# Patient Record
Sex: Female | Born: 1960 | Race: White | Hispanic: No | Marital: Married | Smoking: Never smoker
Health system: Southern US, Community
[De-identification: ages and names within clinical notes are randomized; demographics above are authoritative.]

## PROBLEM LIST (undated history)

## (undated) DIAGNOSIS — U071 COVID-19: Secondary | ICD-10-CM

## (undated) DIAGNOSIS — D126 Benign neoplasm of colon, unspecified: Secondary | ICD-10-CM

## (undated) DIAGNOSIS — K200 Eosinophilic esophagitis: Secondary | ICD-10-CM

## (undated) DIAGNOSIS — J45909 Unspecified asthma, uncomplicated: Secondary | ICD-10-CM

## (undated) DIAGNOSIS — M1990 Unspecified osteoarthritis, unspecified site: Secondary | ICD-10-CM

## (undated) DIAGNOSIS — K5790 Diverticulosis of intestine, part unspecified, without perforation or abscess without bleeding: Secondary | ICD-10-CM

## (undated) DIAGNOSIS — Z803 Family history of malignant neoplasm of breast: Secondary | ICD-10-CM

## (undated) DIAGNOSIS — R748 Abnormal levels of other serum enzymes: Secondary | ICD-10-CM

## (undated) DIAGNOSIS — M1120 Other chondrocalcinosis, unspecified site: Secondary | ICD-10-CM

## (undated) DIAGNOSIS — Z801 Family history of malignant neoplasm of trachea, bronchus and lung: Secondary | ICD-10-CM

## (undated) DIAGNOSIS — Z808 Family history of malignant neoplasm of other organs or systems: Secondary | ICD-10-CM

## (undated) DIAGNOSIS — E119 Type 2 diabetes mellitus without complications: Secondary | ICD-10-CM

## (undated) DIAGNOSIS — G589 Mononeuropathy, unspecified: Secondary | ICD-10-CM

## (undated) DIAGNOSIS — R7689 Other specified abnormal immunological findings in serum: Secondary | ICD-10-CM

## (undated) DIAGNOSIS — K760 Fatty (change of) liver, not elsewhere classified: Secondary | ICD-10-CM

## (undated) DIAGNOSIS — R32 Unspecified urinary incontinence: Secondary | ICD-10-CM

## (undated) DIAGNOSIS — K648 Other hemorrhoids: Secondary | ICD-10-CM

## (undated) DIAGNOSIS — K222 Esophageal obstruction: Secondary | ICD-10-CM

## (undated) DIAGNOSIS — T7840XA Allergy, unspecified, initial encounter: Secondary | ICD-10-CM

## (undated) DIAGNOSIS — E785 Hyperlipidemia, unspecified: Secondary | ICD-10-CM

## (undated) DIAGNOSIS — K224 Dyskinesia of esophagus: Secondary | ICD-10-CM

## (undated) DIAGNOSIS — K219 Gastro-esophageal reflux disease without esophagitis: Secondary | ICD-10-CM

## (undated) DIAGNOSIS — R0981 Nasal congestion: Secondary | ICD-10-CM

## (undated) DIAGNOSIS — S149XXA Injury of unspecified nerves of neck, initial encounter: Secondary | ICD-10-CM

## (undated) DIAGNOSIS — R768 Other specified abnormal immunological findings in serum: Secondary | ICD-10-CM

## (undated) DIAGNOSIS — Z8049 Family history of malignant neoplasm of other genital organs: Secondary | ICD-10-CM

## (undated) HISTORY — DX: Family history of malignant neoplasm of other genital organs: Z80.49

## (undated) HISTORY — DX: Other specified abnormal immunological findings in serum: R76.8

## (undated) HISTORY — DX: Gastro-esophageal reflux disease without esophagitis: K21.9

## (undated) HISTORY — PX: ABDOMINAL HYSTERECTOMY: SHX81

## (undated) HISTORY — DX: Diverticulosis of intestine, part unspecified, without perforation or abscess without bleeding: K57.90

## (undated) HISTORY — DX: Other chondrocalcinosis, unspecified site: M11.20

## (undated) HISTORY — DX: Unspecified osteoarthritis, unspecified site: M19.90

## (undated) HISTORY — DX: Type 2 diabetes mellitus without complications: E11.9

## (undated) HISTORY — DX: Family history of malignant neoplasm of trachea, bronchus and lung: Z80.1

## (undated) HISTORY — DX: COVID-19: U07.1

## (undated) HISTORY — PX: COLONOSCOPY: SHX174

## (undated) HISTORY — DX: Other specified abnormal immunological findings in serum: R76.89

## (undated) HISTORY — DX: Other hemorrhoids: K64.8

## (undated) HISTORY — DX: Fatty (change of) liver, not elsewhere classified: K76.0

## (undated) HISTORY — DX: Hyperlipidemia, unspecified: E78.5

## (undated) HISTORY — DX: Eosinophilic esophagitis: K20.0

## (undated) HISTORY — DX: Abnormal levels of other serum enzymes: R74.8

## (undated) HISTORY — DX: Mononeuropathy, unspecified: G58.9

## (undated) HISTORY — DX: Dyskinesia of esophagus: K22.4

## (undated) HISTORY — DX: Esophageal obstruction: K22.2

## (undated) HISTORY — DX: Allergy, unspecified, initial encounter: T78.40XA

## (undated) HISTORY — DX: Nasal congestion: R09.81

## (undated) HISTORY — DX: Unspecified asthma, uncomplicated: J45.909

## (undated) HISTORY — DX: Unspecified urinary incontinence: R32

## (undated) HISTORY — DX: Injury of unspecified nerves of neck, initial encounter: S14.9XXA

## (undated) HISTORY — DX: Family history of malignant neoplasm of breast: Z80.3

## (undated) HISTORY — DX: Benign neoplasm of colon, unspecified: D12.6

## (undated) HISTORY — DX: Family history of malignant neoplasm of other organs or systems: Z80.8

---
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
## 1996-11-13 HISTORY — PX: TUBAL LIGATION: SHX77

## 2002-11-03 ENCOUNTER — Other Ambulatory Visit: Admission: RE | Admit: 2002-11-03 | Discharge: 2002-11-03 | Payer: Self-pay | Admitting: Dermatology

## 2010-07-12 ENCOUNTER — Ambulatory Visit: Payer: Self-pay | Admitting: Gynecology

## 2010-07-12 ENCOUNTER — Other Ambulatory Visit: Admission: RE | Admit: 2010-07-12 | Discharge: 2010-07-12 | Payer: Self-pay | Admitting: Gynecology

## 2010-07-14 HISTORY — PX: TOTAL VAGINAL HYSTERECTOMY: SHX2548

## 2010-07-15 ENCOUNTER — Ambulatory Visit: Payer: Self-pay | Admitting: Gynecology

## 2010-08-01 ENCOUNTER — Ambulatory Visit: Payer: Self-pay | Admitting: Gynecology

## 2010-08-09 ENCOUNTER — Ambulatory Visit: Payer: Self-pay | Admitting: Gynecology

## 2010-08-09 ENCOUNTER — Ambulatory Visit (HOSPITAL_BASED_OUTPATIENT_CLINIC_OR_DEPARTMENT_OTHER): Admission: RE | Admit: 2010-08-09 | Discharge: 2010-08-10 | Payer: Self-pay | Admitting: Gynecology

## 2010-08-24 ENCOUNTER — Ambulatory Visit: Payer: Self-pay | Admitting: Gynecology

## 2010-09-07 ENCOUNTER — Ambulatory Visit: Payer: Self-pay | Admitting: Gynecology

## 2011-01-26 LAB — DIFFERENTIAL
Eosinophils Relative: 0 % (ref 0–5)
Lymphocytes Relative: 17 % (ref 12–46)
Lymphs Abs: 2.6 10*3/uL (ref 0.7–4.0)
Monocytes Relative: 7 % (ref 3–12)

## 2011-01-26 LAB — CBC
HCT: 34 % — ABNORMAL LOW (ref 36.0–46.0)
Hemoglobin: 11.8 g/dL — ABNORMAL LOW (ref 12.0–15.0)
MCV: 89.3 fL (ref 78.0–100.0)
Platelets: 205 10*3/uL (ref 150–400)
RBC: 3.8 MIL/uL — ABNORMAL LOW (ref 3.87–5.11)
WBC: 15 10*3/uL — ABNORMAL HIGH (ref 4.0–10.5)

## 2011-05-19 ENCOUNTER — Encounter: Payer: Self-pay | Admitting: *Deleted

## 2011-07-20 ENCOUNTER — Encounter: Payer: Self-pay | Admitting: Gynecology

## 2011-08-24 ENCOUNTER — Encounter: Payer: Self-pay | Admitting: Gynecology

## 2011-08-24 ENCOUNTER — Other Ambulatory Visit (HOSPITAL_COMMUNITY)
Admission: RE | Admit: 2011-08-24 | Discharge: 2011-08-24 | Disposition: A | Discharge status: Home / Self Care | Payer: BC Managed Care – PPO | Source: Ambulatory Visit | Attending: Gynecology | Admitting: Gynecology

## 2011-08-24 ENCOUNTER — Other Ambulatory Visit: Payer: Self-pay | Admitting: Gynecology

## 2011-08-24 ENCOUNTER — Ambulatory Visit (INDEPENDENT_AMBULATORY_CARE_PROVIDER_SITE_OTHER): Payer: BC Managed Care – PPO | Admitting: Gynecology

## 2011-08-24 VITALS — BP 130/70 | Ht 65.5 in | Wt 170.0 lb

## 2011-08-24 DIAGNOSIS — N898 Other specified noninflammatory disorders of vagina: Secondary | ICD-10-CM

## 2011-08-24 DIAGNOSIS — R0981 Nasal congestion: Secondary | ICD-10-CM | POA: Insufficient documentation

## 2011-08-24 DIAGNOSIS — F411 Generalized anxiety disorder: Secondary | ICD-10-CM

## 2011-08-24 DIAGNOSIS — R42 Dizziness and giddiness: Secondary | ICD-10-CM

## 2011-08-24 DIAGNOSIS — B373 Candidiasis of vulva and vagina: Secondary | ICD-10-CM

## 2011-08-24 DIAGNOSIS — Z23 Encounter for immunization: Secondary | ICD-10-CM

## 2011-08-24 DIAGNOSIS — Z1231 Encounter for screening mammogram for malignant neoplasm of breast: Secondary | ICD-10-CM

## 2011-08-24 DIAGNOSIS — F419 Anxiety disorder, unspecified: Secondary | ICD-10-CM

## 2011-08-24 DIAGNOSIS — Z01419 Encounter for gynecological examination (general) (routine) without abnormal findings: Secondary | ICD-10-CM

## 2011-08-24 MED ORDER — ALPRAZOLAM 0.5 MG PO TABS: 0.5000 mg | ORAL_TABLET | Freq: Every evening | ORAL | Status: AC | PRN

## 2011-08-24 MED ORDER — FLUCONAZOLE 150 MG PO TABS: 150.0000 mg | ORAL_TABLET | Freq: Once | ORAL | Status: AC

## 2011-08-24 NOTE — Progress Notes (Signed)
Carla Morales [birth year] [id number]        50 y.o.  for annual exam.  Notes itchy vaginal discharge comes and goes over the last several months. She is status post [facility] in September 2011 due to menorrhagia, leiomyoma and prolapse.  She does note 2 week history of some vertigo with positional changes going from lying to standing or from standing to lying or bending over. She says the room spins. She has no overt sinusitis, congestion symptoms. No fevers chills or URI symptoms.  Past medical history,surgical history, medications, allergies, family history and social history were all reviewed and documented in the EPIC chart. ROS:  Was performed and pertinent positives and negatives are included in the history.  Exam: chaperone present Filed Vitals:   08/24/11 1020  BP: 130/70   General appearance  Normal Skin grossly normal Head/Neck normal with no cervical or supraclavicular adenopathy thyroid normal. No nystagmus.  Ear exam is normal noting normal tympanic membranes bilaterally. Lungs  clear Cardiac RR, without RMG Abdominal  soft, nontender, without masses, organomegaly or hernia Breasts  examined lying and sitting without masses, retractions, discharge or axillary adenopathy. Pelvic  Ext/BUS/vagina  Normal with white discharge, KOH wet prep done, Pap done  Adnexa  Without masses or tenderness    Anus and perineum  normal   Rectovaginal  normal sphincter tone without palpated masses or tenderness.    Assessment/Plan:  50 y.o. female for annual exam.    1. White discharge.  KOH prep was positive for yeast we'll treat with Diflucan 150x1 dose. Follow up if symptoms persist or recur. 2. Vertigo.  I think this is inner ear-related. Exam is normal.  Recommended OTC decongestant for 2 weeks. If continues will follow up with her primary. 3. Health maintenance. Self breast exams on a monthly basis discussed encouraged. Overdue for mammogram and I strongly encouraged her to schedule this and  she agrees to do so. Lipid profile glucose done through her primary. I ordered a CBC and urinalysis. She did ask for Xanax that she uses occasionally for anxiety and I prescribed #30 0.5 mg tablets no refill to take 1/2-6 hours when necessary. She also received the flu shot today. Assuming she continues well from a gynecologic standpoint she will see [place] in a year sooner as needed    Dara Lords MD, 11:10 AM 08/24/2011

## 2011-08-24 NOTE — Patient Instructions (Signed)
Schedule mammogram.

## 2011-09-13 ENCOUNTER — Ambulatory Visit
Admission: RE | Admit: 2011-09-13 | Discharge: 2011-09-13 | Disposition: A | Discharge status: Home / Self Care | Payer: BC Managed Care – PPO | Source: Ambulatory Visit | Attending: Gynecology | Admitting: Gynecology

## 2011-09-13 DIAGNOSIS — Z1231 Encounter for screening mammogram for malignant neoplasm of breast: Secondary | ICD-10-CM

## 2011-10-09 ENCOUNTER — Other Ambulatory Visit: Payer: Self-pay | Admitting: Family Medicine

## 2011-10-12 ENCOUNTER — Ambulatory Visit (HOSPITAL_COMMUNITY): Payer: BC Managed Care – PPO

## 2012-10-09 ENCOUNTER — Encounter: Payer: Self-pay | Admitting: Gynecology

## 2012-10-09 ENCOUNTER — Ambulatory Visit (INDEPENDENT_AMBULATORY_CARE_PROVIDER_SITE_OTHER): Payer: BC Managed Care – PPO | Admitting: Gynecology

## 2012-10-09 VITALS — BP 116/78 | Ht 65.0 in | Wt 173.0 lb

## 2012-10-09 DIAGNOSIS — Z01419 Encounter for gynecological examination (general) (routine) without abnormal findings: Secondary | ICD-10-CM

## 2012-10-09 DIAGNOSIS — R21 Rash and other nonspecific skin eruption: Secondary | ICD-10-CM

## 2012-10-09 MED ORDER — NYSTATIN-TRIAMCINOLONE 100000-0.1 UNIT/GM-% EX OINT: TOPICAL_OINTMENT | Freq: Two times a day (BID) | CUTANEOUS | Status: DC

## 2012-10-09 NOTE — Patient Instructions (Signed)
Try cream under breasts for rash.  See a dermatologist if it continue. Schedule mammogram. Follow up in one year for annual exam

## 2012-10-09 NOTE — Progress Notes (Signed)
Carla Morales 08/25/1961 [id number]        51 y.o.  G1P1 for annual exam.  Several issues that are below.  Past medical history,surgical history, medications, allergies, family history and social history were all reviewed and documented in the EPIC chart. ROS:  Was performed and pertinent positives and negatives are included in the history.  Exam: Fleet Contras assistant Filed Vitals:   10/09/12 1032  BP: 116/78  Height: 5\' 5"  (1.651 m)  Weight: 173 lb (78.472 kg)   General appearance  Normal Skin grossly normal Head/Neck normal with no cervical or supraclavicular adenopathy thyroid normal Lungs  clear Cardiac RR, without RMG Abdominal  soft, nontender, without masses, organomegaly or hernia Breasts  examined lying and sitting without masses, retractions, discharge or axillary adenopathy.  Papular erythematous rash under both breasts. Pelvic  Ext/BUS/vagina  normal   Adnexa  Without masses or tenderness    Anus and perineum  normal   Rectovaginal  normal sphincter tone without palpated masses or tenderness.    Assessment/Plan:  51 y.o. G1P1 female for annual exam.   1. TVH 2011 doing well. No menopausal symptoms. Continue to monitor. 2. Pap smear. No Pap smear done today. Last Pap smear 2012. No history of abnormal Pap smears. Status post hysterectomy for benign indications. Options to stop screening altogether or less frequent screening reviewed. We'll readdress on an annual basis. 3. Mammogram. Patient overdo and knows to schedule this. Agrees to do so.  SBE monthly. 4. Rash under each breast. Not classic for yeast. We'll treat with Mytrex twice a day. If continues will make appointment to see a dermatologist. If results and will follow. 5. Health maintenance. Actively being followed by her primary physician recently started on Pravachol.  No lab work done today as is all done through her primary physician's office. Follow up one year, sooner as needed.   Dara Lords MD,  11:01 AM 10/09/2012

## 2012-10-14 ENCOUNTER — Encounter: Payer: Self-pay | Admitting: Gynecology

## 2013-04-10 ENCOUNTER — Other Ambulatory Visit: Payer: Self-pay

## 2013-04-10 MED ORDER — PRAVASTATIN SODIUM 20 MG PO TABS: 20.0000 mg | ORAL_TABLET | Freq: Every day | ORAL | Status: DC

## 2013-05-31 ENCOUNTER — Other Ambulatory Visit: Payer: Self-pay | Admitting: Family Medicine

## 2013-06-03 NOTE — Telephone Encounter (Signed)
LAST LABS 12/13. NTBS

## 2013-06-16 ENCOUNTER — Other Ambulatory Visit: Payer: Self-pay | Admitting: Family Medicine

## 2013-06-17 NOTE — Telephone Encounter (Signed)
Patient last seen in office on 10-08-12. Please advise

## 2013-06-24 ENCOUNTER — Other Ambulatory Visit: Payer: Self-pay

## 2013-06-24 DIAGNOSIS — Z1231 Encounter for screening mammogram for malignant neoplasm of breast: Secondary | ICD-10-CM

## 2013-07-04 ENCOUNTER — Ambulatory Visit
Admission: RE | Admit: 2013-07-04 | Discharge: 2013-07-04 | Disposition: A | Discharge status: Home / Self Care | Payer: BC Managed Care – PPO | Source: Ambulatory Visit

## 2013-07-04 DIAGNOSIS — Z1231 Encounter for screening mammogram for malignant neoplasm of breast: Secondary | ICD-10-CM

## 2013-07-11 ENCOUNTER — Other Ambulatory Visit: Payer: Self-pay | Admitting: Family Medicine

## 2013-08-15 ENCOUNTER — Encounter: Payer: Self-pay | Admitting: Family Medicine

## 2013-08-15 ENCOUNTER — Ambulatory Visit (INDEPENDENT_AMBULATORY_CARE_PROVIDER_SITE_OTHER): Payer: BC Managed Care – PPO | Admitting: Family Medicine

## 2013-08-15 VITALS — BP 121/71 | HR 59 | Temp 98.0°F | Ht 66.0 in | Wt 181.6 lb

## 2013-08-15 DIAGNOSIS — J3489 Other specified disorders of nose and nasal sinuses: Secondary | ICD-10-CM

## 2013-08-15 DIAGNOSIS — R0981 Nasal congestion: Secondary | ICD-10-CM

## 2013-08-15 DIAGNOSIS — G589 Mononeuropathy, unspecified: Secondary | ICD-10-CM | POA: Insufficient documentation

## 2013-08-15 DIAGNOSIS — T7840XA Allergy, unspecified, initial encounter: Secondary | ICD-10-CM | POA: Insufficient documentation

## 2013-08-15 DIAGNOSIS — J3089 Other allergic rhinitis: Secondary | ICD-10-CM | POA: Insufficient documentation

## 2013-08-15 DIAGNOSIS — J309 Allergic rhinitis, unspecified: Secondary | ICD-10-CM | POA: Insufficient documentation

## 2013-08-15 DIAGNOSIS — E785 Hyperlipidemia, unspecified: Secondary | ICD-10-CM | POA: Insufficient documentation

## 2013-08-15 DIAGNOSIS — G43909 Migraine, unspecified, not intractable, without status migrainosus: Secondary | ICD-10-CM

## 2013-08-15 MED ORDER — FLUTICASONE PROPIONATE 50 MCG/ACT NA SUSP: 2.0000 | Freq: Every day | NASAL | Status: DC

## 2013-08-15 MED ORDER — MELOXICAM 15 MG PO TABS: 7.5000 mg | ORAL_TABLET | Freq: Every day | ORAL | Status: DC

## 2013-08-15 MED ORDER — CYCLOBENZAPRINE HCL 10 MG PO TABS: 10.0000 mg | ORAL_TABLET | Freq: Every day | ORAL | Status: DC

## 2013-08-15 NOTE — Progress Notes (Signed)
Patient ID: Carla Morales, female   DOB: January 27, 1961, 52 y.o.   MRN: [id number] SUBJECTIVE: CC: Chief Complaint  Patient presents with  . Follow-up    discuss labs been off cholesterol meds and states "feels better when not on it"    HPI: Taking Omega 3 Krill oil. Than being on statin. She feels better without the statin Neck still flares up occasional. Wear a night guard for her teeth. Got a neck pillow: did not do well with her neck. Stopped Pravastatin 3 weeks.  Past Medical History  Diagnosis Date  . Migraine   . Sinus congestion   . Hyperlipidemia   . Pinched nerve in neck    Past Surgical History  Procedure Laterality Date  . Tonsillectomy    . Bladder dialation      as a child  . Tubal ligation    . Total vaginal hysterectomy  07/2010    menorrhagia, leiomyoma, prolapse   History   Social History  . Marital Status: Married    Spouse Name: N/A    Number of Children: N/A  . Years of Education: N/A   Occupational History  . Not on file.   Social History Main Topics  . Smoking status: Never Smoker   . Smokeless tobacco: Never Used  . Alcohol Use: No  . Drug Use: No  . Sexual Activity: Yes    Partners: Male    Birth Control/ Protection: Surgical     Comment: hyst.   Other Topics Concern  . Not on file   Social History Narrative  . No narrative on file   Family History  Problem Relation Age of Onset  . Hypertension Father   . Diabetes Maternal Aunt   . Diabetes Maternal Uncle   . Diabetes Paternal Aunt   . Breast cancer Paternal Aunt     Dx in her 41's  . Diabetes Paternal Uncle   . Heart disease Maternal Grandfather    Current Outpatient Prescriptions on File Prior to Visit  Medication Sig Dispense Refill  . cyclobenzaprine (FLEXERIL) 10 MG tablet Take 10 mg by mouth 3 (three) times daily as needed.      Marland Kitchen ibuprofen (ADVIL,MOTRIN) 800 MG tablet Take 800 mg by mouth every 8 (eight) hours as needed.        . pravastatin (PRAVACHOL) 20 MG tablet  TAKE 1 TABLET (20 MG TOTAL) BY MOUTH DAILY.  15 tablet  0   No current facility-administered medications on file prior to visit.   Allergies  Allergen Reactions  . Haldol [Haloperidol Decanoate]   . Sulfa Antibiotics    Immunization History  Administered Date(s) Administered  . Influenza Split 08/24/2011, 08/13/2013   Prior to Admission medications   Medication Sig Start Date End Date Taking? Authorizing Provider  cyclobenzaprine (FLEXERIL) 10 MG tablet Take 10 mg by mouth 3 (three) times daily as needed.   Yes Historical Provider, MD  meloxicam (MOBIC) 7.5 MG tablet Take 7.5 mg by mouth daily.   Yes Historical Provider, MD  Omega-3 Krill Oil 500 MG CAPS Take 1 capsule by mouth daily.   Yes Historical Provider, MD  ibuprofen (ADVIL,MOTRIN) 800 MG tablet Take 800 mg by mouth every 8 (eight) hours as needed.      Historical Provider, MD  pravastatin (PRAVACHOL) 20 MG tablet TAKE 1 TABLET (20 MG TOTAL) BY MOUTH DAILY. 06/16/13   Ileana Ladd, MD     ROS: As above in the HPI. All other systems are stable or  negative.  OBJECTIVE: APPEARANCE:  Patient in no acute distress.The patient appeared well nourished and normally developed. Acyanotic. Waist: VITAL SIGNS:BP 121/71  Pulse 59  Temp(Src) 98 F (36.7 C) (Oral)  Ht 5\' 6"  (1.676 m)  Wt 181 lb 9.6 oz (82.373 kg)  BMI 29.32 kg/m2  LMP 07/24/2010 WF  SKIN: warm and  Dry without overt rashes, tattoos and scars  HEAD and Neck: without JVD, Head and scalp: normal Eyes:No scleral icterus. Fundi normal, eye movements normal. Ears: Auricle normal, canal normal, Tympanic membranes normal, insufflation normal. Nose: nasal congestion. Sinuses nontender Throat: normal Neck & thyroid :neck reduced ROM . Thyroid: normal  CHEST & LUNGS: Chest wall: normal Lungs: Clear  CVS: Reveals the PMI to be normally located. Regular rhythm, First and Second Heart sounds are normal,  absence of murmurs, rubs or gallops. Peripheral  vasculature: Radial pulses: normal Dorsal pedis pulses: normal Posterior pulses: normal  ABDOMEN:  Appearance: normal Benign, no organomegaly, no masses, no Abdominal Aortic enlargement. No Guarding , no rebound. No Bruits. Bowel sounds: normal  RECTAL: N/A GU: N/A  EXTREMETIES: nonedematous.  MUSCULOSKELETAL:  Spine: normal Joints: intact  NEUROLOGIC: oriented to time,place and person; nonfocal. Strength is normal Sensory is normal Reflexes are normal Cranial Nerves are normal.  ASSESSMENT: Hyperlipidemia  Migraine  Pinched nerve in neck - Plan: meloxicam (MOBIC) 15 MG tablet, cyclobenzaprine (FLEXERIL) 10 MG tablet  Sinus congestion - Plan: fluticasone (FLONASE) 50 MCG/ACT nasal spray   PLAN:  Medications Discontinued During This Encounter  Medication Reason  . HYDROcodone-acetaminophen (NORCO/VICODIN) 5-325 MG per tablet Completed Course  . nystatin-triamcinolone ointment (MYCOLOG) Completed Course  . traMADol (ULTRAM) 50 MG tablet Completed Course  . ibuprofen (ADVIL,MOTRIN) 800 MG tablet Completed Course  . pravastatin (PRAVACHOL) 20 MG tablet Change in therapy  . meloxicam (MOBIC) 7.5 MG tablet Reorder  . cyclobenzaprine (FLEXERIL) 10 MG tablet Reorder   Meds ordered this encounter  Medications  . DISCONTD: HYDROcodone-acetaminophen (NORCO/VICODIN) 5-325 MG per tablet    Sig:   . DISCONTD: meloxicam (MOBIC) 7.5 MG tablet    Sig: Take 7.5 mg by mouth daily.  . Omega-3 Krill Oil 500 MG CAPS    Sig: Take 1 capsule by mouth daily.  . meloxicam (MOBIC) 15 MG tablet    Sig: Take 0.5 tablets (7.5 mg total) by mouth daily.    Dispense:  30 tablet    Refill:  3  . cyclobenzaprine (FLEXERIL) 10 MG tablet    Sig: Take 1 tablet (10 mg total) by mouth at bedtime.    Dispense:  30 tablet    Refill:  3  . fluticasone (FLONASE) 50 MCG/ACT nasal spray    Sig: Place 2 sprays into the nose daily.    Dispense:  16 g    Refill:  6   Patient has been intolerant to  the statin. She prefers aggressive diet. Educated on a plant based diet       Dr Woodroe Mode Recommendations  For nutrition information, I recommend books:  1).Eat to Live by Dr Monico Hoar. 2).Prevent and Reverse Heart Disease by Dr Suzzette Righter. 3) Dr Katherina Right Book:  Program to Reverse Diabetes 4) The [place] Study by Florene Route  Exercise recommendations are:  If unable to walk, then the patient can exercise in a chair 3 times a day. By flapping arms like a bird gently and raising legs outwards to the front.  If ambulatory, the patient can go for walks for 30 minutes 3  times a week. Then increase the intensity and duration as tolerated.  Goal is to try to attain exercise frequency to 5 times a week.  If applicable: Best to perform resistance exercises (machines or weights) 2 days a week and cardio type exercises 3 days per week.  Return in about 3 months (around 11/15/2013) for Recheck medical problems.  Aleicia Kenagy P. Modesto Charon, M.D.

## 2013-08-15 NOTE — Patient Instructions (Addendum)
      Dr Renalda Locklin's Recommendations  For nutrition information, I recommend books:  1).Eat to Live by Dr Joel Fuhrman. 2).Prevent and Reverse Heart Disease by Dr Caldwell Esselstyn. 3) Dr Neal Barnard's Book:  Program to Reverse Diabetes 4) The [place] Study by T Colin Campbell  Exercise recommendations are:  If unable to walk, then the patient can exercise in a chair 3 times a day. By flapping arms like a bird gently and raising legs outwards to the front.  If ambulatory, the patient can go for walks for 30 minutes 3 times a week. Then increase the intensity and duration as tolerated.  Goal is to try to attain exercise frequency to 5 times a week.  If applicable: Best to perform resistance exercises (machines or weights) 2 days a week and cardio type exercises 3 days per week.  

## 2013-10-16 ENCOUNTER — Ambulatory Visit (INDEPENDENT_AMBULATORY_CARE_PROVIDER_SITE_OTHER): Payer: BC Managed Care – PPO | Admitting: Gynecology

## 2013-10-16 ENCOUNTER — Encounter: Payer: Self-pay | Admitting: Gynecology

## 2013-10-16 VITALS — BP 120/66 | Ht 65.0 in | Wt 179.0 lb

## 2013-10-16 DIAGNOSIS — Z01419 Encounter for gynecological examination (general) (routine) without abnormal findings: Secondary | ICD-10-CM

## 2013-10-16 DIAGNOSIS — N951 Menopausal and female climacteric states: Secondary | ICD-10-CM

## 2013-10-16 DIAGNOSIS — L290 Pruritus ani: Secondary | ICD-10-CM

## 2013-10-16 LAB — TSH: TSH: 1.211 u[IU]/mL (ref 0.350–4.500)

## 2013-10-16 MED ORDER — FLUCONAZOLE 200 MG PO TABS: 200.0000 mg | ORAL_TABLET | Freq: Every day | ORAL | Status: DC

## 2013-10-16 MED ORDER — NYSTATIN-TRIAMCINOLONE 100000-0.1 UNIT/GM-% EX OINT: 1.0000 "application " | TOPICAL_OINTMENT | Freq: Two times a day (BID) | CUTANEOUS | Status: DC

## 2013-10-16 NOTE — Patient Instructions (Signed)
Take Diflucan pill daily for 5 days. Use the cream perianally and under the breasts as needed.  Schedule colonoscopy with [place] [facility] at [phone] or [facility] gastroenterology at [phone] as you have turned 52 and we recommend screening colonoscopy at age 52.

## 2013-10-16 NOTE — Progress Notes (Signed)
Carla Morales [birth year] [id number]        52 y.o.  G1P1 for annual exam.  Several issues noted below.  Past medical history,surgical history, problem list, medications, allergies, family history and social history were all reviewed and documented in the EPIC chart.  ROS:  Performed and pertinent positives and negatives are included in the history, assessment and plan .  Exam: Kim assistant Filed Vitals:   10/16/13 1358  BP: 120/66  Height: 5\' 5"  (1.651 m)  Weight: 179 lb (81.194 kg)   General appearance  Normal Skin grossly normal Head/Neck normal with no cervical or supraclavicular adenopathy thyroid normal Lungs  clear Cardiac RR, without RMG Abdominal  soft, nontender, without masses, organomegaly or hernia Breasts  examined lying and sitting without masses, retractions, discharge or axillary adenopathy. Pelvic  Ext/BUS/vagina  Normal    Adnexa  Without masses or tenderness    Anus and perineum  Normal   Rectovaginal  Normal sphincter tone without palpated masses or tenderness.    Assessment/Plan:  52 y.o. G1P1 female for annual exam.   1. Pruritis ani. Patient notes perianal itching for the past several months. The vaginal discharge or vaginal itching. Did have a rash under both breasts/ear treated with Mytrex which resolved. We'll treat with Diflucan 200 mg daily for 5 days and Mytrex cream perianally twice daily. Followup if symptoms persist worsen or recur. 2. Status post TVH for menorrhagia leiomyomata prolapse 2011. Is having some hot flushes and night sweats. Will check baseline TSH FSH. Also noting some increased forgetfulness and fuzzy thinking. Possible HRT reviewed. I discussed the risks benefits and WHI study. Stroke heart attack DVT and breast cancer issues reviewed. Patient will consider patch if FSH rising. 3. Pap smear 2012. No Pap smear done today. No history of abnormal Pap smears previously. Option cystoscopy altogether she is status post hysterectomy for  benign indications versus less frequent screening intervals discussed. We'll readdress on an annual basis. 4. Mammography 06/2013. Continue with annual mammography. SBE monthly reviewed. 5. Colonoscopy recommended screening colonoscopy as she has turned 52. 6. Health maintenance. Patient has routine blood work done through her primary physician's office. Followup one year, sooner as needed.   Note: This document was prepared with digital dictation and possible smart phrase technology. Any transcriptional errors that result from this process are unintentional.   Dara Lords MD, 2:24 PM 10/16/2013

## 2013-10-17 LAB — URINALYSIS W MICROSCOPIC + REFLEX CULTURE
Bilirubin Urine: NEGATIVE
Casts: NONE SEEN
Glucose, UA: NEGATIVE mg/dL
Hgb urine dipstick: NEGATIVE
Leukocytes, UA: NEGATIVE
Squamous Epithelial / LPF: NONE SEEN
pH: 6 (ref 5.0–8.0)

## 2013-11-27 ENCOUNTER — Encounter: Payer: Self-pay | Admitting: Family Medicine

## 2013-11-27 ENCOUNTER — Ambulatory Visit (INDEPENDENT_AMBULATORY_CARE_PROVIDER_SITE_OTHER): Payer: BC Managed Care – PPO | Admitting: Family Medicine

## 2013-11-27 VITALS — BP 134/79 | HR 66 | Temp 98.2°F

## 2013-11-27 DIAGNOSIS — J029 Acute pharyngitis, unspecified: Secondary | ICD-10-CM

## 2013-11-27 DIAGNOSIS — R0789 Other chest pain: Secondary | ICD-10-CM

## 2013-11-27 DIAGNOSIS — J9801 Acute bronchospasm: Secondary | ICD-10-CM | POA: Insufficient documentation

## 2013-11-27 DIAGNOSIS — J209 Acute bronchitis, unspecified: Secondary | ICD-10-CM

## 2013-11-27 DIAGNOSIS — J069 Acute upper respiratory infection, unspecified: Secondary | ICD-10-CM | POA: Insufficient documentation

## 2013-11-27 LAB — POCT RAPID STREP A (OFFICE): Rapid Strep A Screen: NEGATIVE

## 2013-11-27 MED ORDER — ALBUTEROL SULFATE HFA 108 (90 BASE) MCG/ACT IN AERS: 2.0000 | INHALATION_SPRAY | Freq: Four times a day (QID) | RESPIRATORY_TRACT | Status: DC | PRN

## 2013-11-27 MED ORDER — AMOXICILLIN 875 MG PO TABS: 875.0000 mg | ORAL_TABLET | Freq: Two times a day (BID) | ORAL | Status: DC

## 2013-11-27 NOTE — Progress Notes (Signed)
Patient ID: Carla Morales, female   DOB: 03/28/1961, 53 y.o.   MRN: [id number] SUBJECTIVE: CC: Chief Complaint  Patient presents with  . Acute Visit    sore throat cough pressure in chest     HPI: Had a respiratory illness several weeks ago and it got better. Felt like it came back with some nasal congestion and chest tight like she cannot get her breath. No chest pain. No orthopnea. No fever. Drainage is at the back of the throat.  Past Medical History  Diagnosis Date  . Migraine   . Sinus congestion   . Pinched nerve in neck   . Allergy   . Hyperlipidemia   . Arthritis    Past Surgical History  Procedure Laterality Date  . Tonsillectomy    . Bladder dialation      as a child  . Tubal ligation    . Total vaginal hysterectomy  07/2010    menorrhagia, leiomyoma, prolapse   History   Social History  . Marital Status: Married    Spouse Name: N/A    Number of Children: N/A  . Years of Education: N/A   Occupational History  . Not on file.   Social History Main Topics  . Smoking status: Never Smoker   . Smokeless tobacco: Never Used  . Alcohol Use: No  . Drug Use: No  . Sexual Activity: Yes    Partners: Male    Birth Control/ Protection: Surgical     Comment: hyst.   Other Topics Concern  . Not on file   Social History Narrative  . No narrative on file   Family History  Problem Relation Age of Onset  . Hypertension Father   . Diabetes Maternal Aunt   . Diabetes Maternal Uncle   . Diabetes Paternal Aunt   . Breast cancer Paternal Aunt     Dx in her 58's  . Diabetes Paternal Uncle   . Heart disease Maternal Grandfather   . Rheum arthritis Mother    Current Outpatient Prescriptions on File Prior to Visit  Medication Sig Dispense Refill  . cyclobenzaprine (FLEXERIL) 10 MG tablet Take 1 tablet (10 mg total) by mouth at bedtime.  30 tablet  3  . fluconazole (DIFLUCAN) 200 MG tablet Take 1 tablet (200 mg total) by mouth daily.  5 tablet  0  . fluticasone  (FLONASE) 50 MCG/ACT nasal spray Place 2 sprays into the nose daily.  16 g  6  . meloxicam (MOBIC) 15 MG tablet Take 0.5 tablets (7.5 mg total) by mouth daily.  30 tablet  3  . Omega-3 Krill Oil 500 MG CAPS Take 1 capsule by mouth daily.      Marland Kitchen nystatin-triamcinolone ointment (MYCOLOG) Apply 1 application topically 2 (two) times daily.  30 g  0   No current facility-administered medications on file prior to visit.   Allergies  Allergen Reactions  . Other Swelling    Contrast dye  . Haldol [Haloperidol Decanoate]   . Pravastatin Other (See Comments)    myalgias  . Sulfa Antibiotics    Immunization History  Administered Date(s) Administered  . Influenza Split 08/24/2011, 08/13/2013   Prior to Admission medications   Medication Sig Start Date End Date Taking? Authorizing Provider  cyclobenzaprine (FLEXERIL) 10 MG tablet Take 1 tablet (10 mg total) by mouth at bedtime. 08/15/13  Yes Vernie Shanks, MD  fluconazole (DIFLUCAN) 200 MG tablet Take 1 tablet (200 mg total) by mouth daily. 10/16/13  Yes  Anastasio Auerbach, MD  fluticasone (FLONASE) 50 MCG/ACT nasal spray Place 2 sprays into the nose daily. 08/15/13  Yes Vernie Shanks, MD  meloxicam (MOBIC) 15 MG tablet Take 0.5 tablets (7.5 mg total) by mouth daily. 08/15/13  Yes Vernie Shanks, MD  Omega-3 Krill Oil 500 MG CAPS Take 1 capsule by mouth daily.   Yes Historical Provider, MD  nystatin-triamcinolone ointment (MYCOLOG) Apply 1 application topically 2 (two) times daily. 10/16/13   Anastasio Auerbach, MD     ROS: As above in the HPI. All other systems are stable or negative.  OBJECTIVE: APPEARANCE:  Patient in no acute distress.The patient appeared well nourished and normally developed. Acyanotic. Waist: VITAL SIGNS:BP 134/79  Pulse 66  Temp(Src) 98.2 F (36.8 C) (Oral)  SpO2 100%  LMP 07/24/2010  WF Works in a bakery  SKIN: warm and  Dry without overt rashes, tattoos and scars  HEAD and Neck: without JVD, Head and  scalp: normal Eyes:No scleral icterus. Fundi normal, eye movements normal. Ears: Auricle normal, canal normal, Tympanic membranes normal, insufflation normal. Nose: nasal congestion and boggy swollen mucosa. Throat: normal Neck & thyroid: normal  CHEST & LUNGS: Chest wall: normal Lungs: coarse breath sounds and equal and reduced flow.prolonged expiratory. Poor effort. No wheezes no rales, no aegonophony. occassional harsh cough.  CVS: Reveals the PMI to be normally located. Regular rhythm, First and Second Heart sounds are normal,  absence of murmurs, rubs or gallops. Peripheral vasculature: Radial pulses: normal Dorsal pedis pulses: normal Posterior pulses: normal  ABDOMEN:  Appearance: overweight Benign, no organomegaly, no masses, no Abdominal Aortic enlargement. No Guarding , no rebound. No Bruits. Bowel sounds: normal  RECTAL: N/A GU: N/A  EXTREMETIES: nonedematous.  MUSCULOSKELETAL:  Spine: normal Joints: intact  NEUROLOGIC: oriented to time,place and person; nonfocal.  Results for orders placed in visit on 10/16/13  URINALYSIS W MICROSCOPIC + REFLEX CULTURE      Result Value Range   Color, Urine YELLOW  YELLOW   APPearance CLEAR  CLEAR   Specific Gravity, Urine 1.025  1.005 - 1.030   pH 6.0  5.0 - 8.0   Glucose, UA NEG  NEG mg/dL   Bilirubin Urine NEG  NEG   Ketones, ur NEG  NEG mg/dL   Hgb urine dipstick NEG  NEG   Protein, ur NEG  NEG mg/dL   Urobilinogen, UA 0.2  0.0 - 1.0 mg/dL   Nitrite NEG  NEG   Leukocytes, UA NEG  NEG   Squamous Epithelial / LPF NONE SEEN  RARE   Crystals NONE SEEN  NONE SEEN   Casts NONE SEEN  NONE SEEN   WBC, UA 0-2  <3 WBC/hpf   RBC / HPF 0-2  <3 RBC/hpf   Bacteria, UA NONE SEEN  RARE  TSH      Result Value Range   TSH 1.211  0.350 - 0.350 uIU/mL  FOLLICLE STIMULATING HORMONE      Result Value Range   FSH 15.5        Peak flow:400,330 , 350 today   ASSESSMENT: Sore throat - Plan: Rapid Strep A, amoxicillin  (AMOXIL) 875 MG tablet  Chest pressure - Plan: EKG 12-Lead  Acute upper respiratory infections of unspecified site  Bronchospasm - Plan: albuterol (PROVENTIL HFA;VENTOLIN HFA) 108 (90 BASE) MCG/ACT inhaler  Acute bronchitis - Plan: amoxicillin (AMOXIL) 875 MG tablet Suspect that she has a mild asthmatic episode with her family history asthma.  PLAN:  Orders Placed This Encounter  Procedures  . Rapid Strep A  . EKG 12-Lead  EKG: no acute findings. RST: negative.  Meds ordered this encounter  Medications  . amoxicillin (AMOXIL) 875 MG tablet    Sig: Take 1 tablet (875 mg total) by mouth 2 (two) times daily.    Dispense:  20 tablet    Refill:  0  . albuterol (PROVENTIL HFA;VENTOLIN HFA) 108 (90 BASE) MCG/ACT inhaler    Sig: Inhale 2 puffs into the lungs every 6 (six) hours as needed for wheezing or shortness of breath.    Dispense:  1 Inhaler    Refill:  1   Discussed with patient asthma Handout on asthma given in the AVS.  There are no discontinued medications. Return in about 2 weeks (around 12/11/2013) for Recheck medical problems.  Mida Cory P. Jacelyn Grip, M.D.

## 2013-11-27 NOTE — Patient Instructions (Signed)
Asthma, Adult Asthma is a recurring condition in which the airways tighten and narrow. Asthma can make it difficult to breathe. It can cause coughing, wheezing, and shortness of breath. Asthma episodes (also called asthma attacks) range from minor to life-threatening. Asthma cannot be cured, but medicines and lifestyle changes can help control it. CAUSES Asthma is believed to be caused by inherited (genetic) and environmental factors, but its exact cause is unknown. Asthma may be triggered by allergens, lung infections, or irritants in the air. Asthma triggers are different for each person. Common triggers include:   Animal dander.  Dust mites.  Cockroaches.  Pollen from trees or grass.  Mold.  Smoke.  Air pollutants such as dust, household cleaners, hair sprays, aerosol sprays, paint fumes, strong chemicals, or strong odors.  Cold air, weather changes, and winds (which increase molds and pollens in the air).  Strong emotional expressions such as crying or laughing hard.  Stress.  Certain medicines (such as aspirin) or types of drugs (such as beta-blockers).  Sulfites in foods and drinks. Foods and drinks that may contain sulfites include dried fruit, potato chips, and sparkling grape juice.  Infections or inflammatory conditions such as the flu, a cold, or an inflammation of the nasal membranes (rhinitis).  Gastroesophageal reflux disease (GERD).  Exercise or strenuous activity. SYMPTOMS Symptoms may occur immediately after asthma is triggered or many hours later. Symptoms include:  Wheezing.  Excessive nighttime or early morning coughing.  Frequent or severe coughing with a common cold.  Chest tightness.  Shortness of breath. DIAGNOSIS  The diagnosis of asthma is made by a review of your medical history and a physical exam. Tests may also be performed. These may include:  Lung function studies. These tests show how much air you breath in and out.  Allergy  tests.  Imaging tests such as X-rays. TREATMENT  Asthma cannot be cured, but it can usually be controlled. Treatment involves identifying and avoiding your asthma triggers. It also involves medicines. There are 2 classes of medicine used for asthma treatment:   Controller medicines. These prevent asthma symptoms from occurring. They are usually taken every day.  Reliever or rescue medicines. These quickly relieve asthma symptoms. They are used as needed and provide short-term relief. Your health care provider will help you create an asthma action plan. An asthma action plan is a written plan for managing and treating your asthma attacks. It includes a list of your asthma triggers and how they may be avoided. It also includes information on when medicines should be taken and when their dosage should be changed. An action plan may also involve the use of a device called a peak flow meter. A peak flow meter measures how well the lungs are working. It helps you monitor your condition. HOME CARE INSTRUCTIONS   Take medicine as directed by your health care provider. Speak with your health care provider if you have questions about how or when to take the medicines.  Use a peak flow meter as directed by your health care provider. Record and keep track of readings.  Understand and use the action plan to help minimize or stop an asthma attack without needing to seek medical care.  Control your home environment in the following ways to help prevent asthma attacks:  Do not smoke. Avoid being exposed to secondhand smoke.  Change your heating and air conditioning filter regularly.  Limit your use of fireplaces and wood stoves.  Get rid of pests (such as roaches and   mice) and their droppings.  Throw away plants if you see mold on them.  Clean your floors and dust regularly. Use unscented cleaning products.  Try to have someone else vacuum for you regularly. Stay out of rooms while they are being  vacuumed and for a short while afterward. If you vacuum, use a dust mask from a hardware store, a double-layered or microfilter vacuum cleaner bag, or a vacuum cleaner with a HEPA filter.  Replace carpet with wood, tile, or vinyl flooring. Carpet can trap dander and dust.  Use allergy-proof pillows, mattress covers, and box spring covers.  Wash bed sheets and blankets every week in hot water and dry them in a dryer.  Use blankets that are made of polyester or cotton.  Clean bathrooms and kitchens with bleach. If possible, have someone repaint the walls in these rooms with mold-resistant paint. Keep out of the rooms that are being cleaned and painted.  Wash hands frequently. SEEK MEDICAL CARE IF:   You have wheezing, shortness of breath, or a cough even if taking medicine to prevent attacks.  The colored mucus you cough up (sputum) is thicker than usual.  Your sputum changes from clear or white to yellow, green, gray, or bloody.  You have any problems that may be related to the medicines you are taking (such as a rash, itching, swelling, or trouble breathing).  You are using a reliever medicine more than 2 3 times per week.  Your peak flow is still at 50 79% of you personal best after following your action plan for 1 hour. SEEK IMMEDIATE MEDICAL CARE IF:   You seem to be getting worse and are unresponsive to treatment during an asthma attack.  You are short of breath even at rest.  You get short of breath when doing very little physical activity.  You have difficulty eating, drinking, or talking due to asthma symptoms.  You develop chest pain.  You develop a fast heartbeat.  You have a bluish color to your lips or fingernails.  You are lightheaded, dizzy, or faint.  Your peak flow is less than 50% of your personal best.  You have a fever or persistent symptoms for more than 2 3 days.  You have a fever and symptoms suddenly get worse. MAKE SURE YOU:   Understand these  instructions.  Will watch your condition.  Will get help right away if you are not doing well or get worse. Document Released: 10/30/2005 Document Revised: 07/02/2013 Document Reviewed: 05/29/2013 [employer] Patient Information 2014 [employer].  

## 2013-12-11 ENCOUNTER — Encounter: Payer: Self-pay | Admitting: Family Medicine

## 2013-12-11 ENCOUNTER — Ambulatory Visit (INDEPENDENT_AMBULATORY_CARE_PROVIDER_SITE_OTHER): Payer: BC Managed Care – PPO | Admitting: Family Medicine

## 2013-12-11 VITALS — BP 127/64 | HR 92 | Temp 97.7°F | Ht 65.0 in | Wt 176.8 lb

## 2013-12-11 DIAGNOSIS — J209 Acute bronchitis, unspecified: Secondary | ICD-10-CM

## 2013-12-11 DIAGNOSIS — J9801 Acute bronchospasm: Secondary | ICD-10-CM

## 2013-12-11 MED ORDER — BECLOMETHASONE DIPROPIONATE 40 MCG/ACT IN AERS: 2.0000 | INHALATION_SPRAY | Freq: Two times a day (BID) | RESPIRATORY_TRACT | Status: DC

## 2013-12-11 NOTE — Progress Notes (Signed)
Patient ID: Carla Morales, female   DOB: 05-Apr-1961, 53 y.o.   MRN: [id number] SUBJECTIVE: CC: Chief Complaint  Patient presents with  . Follow-up    2 week reck breathing     HPI: 2 week recheck of bronchitis and bronchospasm and chest tightness. A lot better. Still has a little problem blowing out. No chest tightness , no wheezing. No fever.  Past Medical History  Diagnosis Date  . Migraine   . Sinus congestion   . Pinched nerve in neck   . Allergy   . Hyperlipidemia   . Arthritis    Past Surgical History  Procedure Laterality Date  . Tonsillectomy    . Bladder dialation      as a child  . Tubal ligation    . Total vaginal hysterectomy  07/2010    menorrhagia, leiomyoma, prolapse   History   Social History  . Marital Status: Married    Spouse Name: N/A    Number of Children: N/A  . Years of Education: N/A   Occupational History  . Not on file.   Social History Main Topics  . Smoking status: Never Smoker   . Smokeless tobacco: Never Used  . Alcohol Use: No  . Drug Use: No  . Sexual Activity: Yes    Partners: Male    Birth Control/ Protection: Surgical     Comment: hyst.   Other Topics Concern  . Not on file   Social History Narrative  . No narrative on file   Family History  Problem Relation Age of Onset  . Hypertension Father   . Diabetes Maternal Aunt   . Diabetes Maternal Uncle   . Diabetes Paternal Aunt   . Breast cancer Paternal Aunt     Dx in her 67's  . Diabetes Paternal Uncle   . Heart disease Maternal Grandfather   . Rheum arthritis Mother    Current Outpatient Prescriptions on File Prior to Visit  Medication Sig Dispense Refill  . albuterol (PROVENTIL HFA;VENTOLIN HFA) 108 (90 BASE) MCG/ACT inhaler Inhale 2 puffs into the lungs every 6 (six) hours as needed for wheezing or shortness of breath.  1 Inhaler  1  . amoxicillin (AMOXIL) 875 MG tablet Take 1 tablet (875 mg total) by mouth 2 (two) times daily.  20 tablet  0  .  cyclobenzaprine (FLEXERIL) 10 MG tablet Take 1 tablet (10 mg total) by mouth at bedtime.  30 tablet  3  . fluconazole (DIFLUCAN) 200 MG tablet Take 1 tablet (200 mg total) by mouth daily.  5 tablet  0  . fluticasone (FLONASE) 50 MCG/ACT nasal spray Place 2 sprays into the nose daily.  16 g  6  . meloxicam (MOBIC) 15 MG tablet Take 0.5 tablets (7.5 mg total) by mouth daily.  30 tablet  3  . nystatin-triamcinolone ointment (MYCOLOG) Apply 1 application topically 2 (two) times daily.  30 g  0  . Omega-3 Krill Oil 500 MG CAPS Take 1 capsule by mouth daily.       No current facility-administered medications on file prior to visit.   Allergies  Allergen Reactions  . Other Swelling    Contrast dye  . Haldol [Haloperidol Decanoate]   . Pravastatin Other (See Comments)    myalgias  . Sulfa Antibiotics    Immunization History  Administered Date(s) Administered  . Influenza Split 08/24/2011, 08/13/2013   Prior to Admission medications   Medication Sig Start Date End Date Taking? Authorizing Provider  albuterol (  PROVENTIL HFA;VENTOLIN HFA) 108 (90 BASE) MCG/ACT inhaler Inhale 2 puffs into the lungs every 6 (six) hours as needed for wheezing or shortness of breath. 11/27/13   Vernie Shanks, MD  amoxicillin (AMOXIL) 875 MG tablet Take 1 tablet (875 mg total) by mouth 2 (two) times daily. 11/27/13   Vernie Shanks, MD  cyclobenzaprine (FLEXERIL) 10 MG tablet Take 1 tablet (10 mg total) by mouth at bedtime. 08/15/13   Vernie Shanks, MD  fluconazole (DIFLUCAN) 200 MG tablet Take 1 tablet (200 mg total) by mouth daily. 10/16/13   Anastasio Auerbach, MD  fluticasone (FLONASE) 50 MCG/ACT nasal spray Place 2 sprays into the nose daily. 08/15/13   Vernie Shanks, MD  meloxicam (MOBIC) 15 MG tablet Take 0.5 tablets (7.5 mg total) by mouth daily. 08/15/13   Vernie Shanks, MD  nystatin-triamcinolone ointment [facility]) Apply 1 application topically 2 (two) times daily. 10/16/13   Anastasio Auerbach, MD  Omega-3  Krill Oil 500 MG CAPS Take 1 capsule by mouth daily.    Historical Provider, MD     ROS: As above in the HPI. All other systems are stable or negative.  OBJECTIVE: APPEARANCE:  Patient in no acute distress.The patient appeared well nourished and normally developed. Acyanotic. Waist: VITAL SIGNS:BP 127/64  Pulse 92  Temp(Src) 97.7 F (36.5 C) (Oral)  Ht 5\' 5"  (1.651 m)  Wt 176 lb 12.8 oz (80.196 kg)  BMI 29.42 kg/m2  SpO2 100%  LMP 07/24/2010 WF looks well  SKIN: warm and  Dry without overt rashes, tattoos and scars  HEAD and Neck: without JVD, Head and scalp: normal Eyes:No scleral icterus. Fundi normal, eye movements normal. Ears: Auricle normal, canal normal, Tympanic membranes normal, insufflation normal. Nose: normal Throat: normal Neck & thyroid: normal  CHEST & LUNGS: Chest wall: normal Lungs: Clear prolonged expiratory phase. Peak flows still low and patient has difficulty with expiration.  CVS: Reveals the PMI to be normally located. Regular rhythm, First and Second Heart sounds are normal,  absence of murmurs, rubs or gallops. Peripheral vasculature: Radial pulses: normal Dorsal pedis pulses: normal Posterior pulses: normal  ABDOMEN:  Appearance: normal Benign, no organomegaly, no masses, no Abdominal Aortic enlargement. No Guarding , no rebound. No Bruits. Bowel sounds: normal  RECTAL: N/A GU: N/A  EXTREMETIES: nonedematous.  MUSCULOSKELETAL:  Spine: normal Joints: intact  NEUROLOGIC: oriented to time,place and person; nonfocal. Strength is normal Sensory is normal Reflexes are normal Cranial Nerves are normal.  ASSESSMENT: Bronchospasm - Plan: beclomethasone (QVAR) 40 MCG/ACT inhaler  Acute bronchitis - Plan: beclomethasone (QVAR) 40 MCG/ACT inhaler  PLAN: Add steroid inhaler to regimen. Use the albuterol as a rescue drug, Consider PFTs.  No orders of the defined types were placed in this encounter.   Meds ordered this  encounter  Medications  . beclomethasone (QVAR) 40 MCG/ACT inhaler    Sig: Inhale 2 puffs into the lungs 2 (two) times daily before a meal.    Dispense:  1 Inhaler    Refill:  5   There are no discontinued medications. Return in about 6 weeks (around 01/22/2014) for rechcek bronchitis.  Carla Mayon P. Jacelyn Grip, M.D.

## 2013-12-25 ENCOUNTER — Telehealth: Payer: Self-pay | Admitting: Family Medicine

## 2013-12-26 ENCOUNTER — Telehealth: Payer: Self-pay | Admitting: Family Medicine

## 2013-12-26 NOTE — Telephone Encounter (Signed)
Spoke with pt and she will schedule appt

## 2013-12-26 NOTE — Telephone Encounter (Signed)
Appt offered for today at 2:40 with wong and patient was unable to come at that time she was also advised that Dr. Jacelyn Grip would be here in the am and she could see him at that time for sinus infection. Patient said she would see how she done and let us know if she could come in or not

## 2013-12-26 NOTE — Telephone Encounter (Signed)
Her respiratory symptoms has behaved like an asthmatic. Will need to do PFTs and intensify treatment. Vanissa Strength P. Jacelyn Grip, M.D.

## 2014-01-22 ENCOUNTER — Encounter: Payer: Self-pay | Admitting: Family Medicine

## 2014-01-22 ENCOUNTER — Ambulatory Visit (INDEPENDENT_AMBULATORY_CARE_PROVIDER_SITE_OTHER): Payer: BC Managed Care – PPO | Admitting: Family Medicine

## 2014-01-22 VITALS — BP 128/82 | HR 80 | Temp 98.6°F | Ht 65.0 in | Wt 185.0 lb

## 2014-01-22 DIAGNOSIS — G589 Mononeuropathy, unspecified: Secondary | ICD-10-CM

## 2014-01-22 DIAGNOSIS — J3489 Other specified disorders of nose and nasal sinuses: Secondary | ICD-10-CM

## 2014-01-22 DIAGNOSIS — S149XXA Injury of unspecified nerves of neck, initial encounter: Secondary | ICD-10-CM

## 2014-01-22 DIAGNOSIS — J9801 Acute bronchospasm: Secondary | ICD-10-CM

## 2014-01-22 DIAGNOSIS — T7840XA Allergy, unspecified, initial encounter: Secondary | ICD-10-CM

## 2014-01-22 DIAGNOSIS — R0981 Nasal congestion: Secondary | ICD-10-CM

## 2014-01-22 DIAGNOSIS — E785 Hyperlipidemia, unspecified: Secondary | ICD-10-CM

## 2014-01-22 MED ORDER — MELOXICAM 15 MG PO TABS: 7.5000 mg | ORAL_TABLET | Freq: Every day | ORAL | Status: DC

## 2014-01-22 NOTE — Progress Notes (Signed)
Patient ID: Carla Morales, female   DOB: 08-08-1961, 53 y.o.   MRN: [id number] SUBJECTIVE: CC: Chief Complaint  Patient presents with  . Follow-up    6 WEEK RECK BRONCHITIS  C/O SINUS    HPI: Follow up on Bronchospasm. Doing well. Uses the albuterol prn as a rescue medication and rarely uses now. Using the QVAR. Now her allergies are flaring up. Sinuses congested. No fever. No chest pain, no SOB.  Occasional neck pain flare up from the pinched nerve. She needs a refill on the meloxicam.  Past Medical History  Diagnosis Date  . Migraine   . Sinus congestion   . Pinched nerve in neck   . Hyperlipidemia   . Arthritis   . Allergy    Past Surgical History  Procedure Laterality Date  . Tonsillectomy    . Bladder dialation      as a child  . Tubal ligation    . Total vaginal hysterectomy  07/2010    menorrhagia, leiomyoma, prolapse   History   Social History  . Marital Status: Married    Spouse Name: N/A    Number of Children: N/A  . Years of Education: N/A   Occupational History  . Not on file.   Social History Main Topics  . Smoking status: Never Smoker   . Smokeless tobacco: Never Used  . Alcohol Use: No  . Drug Use: No  . Sexual Activity: Yes    Partners: Male    Birth Control/ Protection: Surgical     Comment: hyst.   Other Topics Concern  . Not on file   Social History Narrative  . No narrative on file   Family History  Problem Relation Age of Onset  . Hypertension Father   . Diabetes Maternal Aunt   . Diabetes Maternal Uncle   . Diabetes Paternal Aunt   . Breast cancer Paternal Aunt     Dx in her 8's  . Diabetes Paternal Uncle   . Heart disease Maternal Grandfather   . Rheum arthritis Mother    Current Outpatient Prescriptions on File Prior to Visit  Medication Sig Dispense Refill  . albuterol (PROVENTIL HFA;VENTOLIN HFA) 108 (90 BASE) MCG/ACT inhaler Inhale 2 puffs into the lungs every 6 (six) hours as needed for wheezing or shortness of  breath.  1 Inhaler  1  . beclomethasone (QVAR) 40 MCG/ACT inhaler Inhale 2 puffs into the lungs 2 (two) times daily before a meal.  1 Inhaler  5  . cyclobenzaprine (FLEXERIL) 10 MG tablet Take 1 tablet (10 mg total) by mouth at bedtime.  30 tablet  3  . fluconazole (DIFLUCAN) 200 MG tablet Take 1 tablet (200 mg total) by mouth daily.  5 tablet  0  . fluticasone (FLONASE) 50 MCG/ACT nasal spray Place 2 sprays into the nose daily.  16 g  6  . Omega-3 Krill Oil 500 MG CAPS Take 1 capsule by mouth daily.      Marland Kitchen amoxicillin (AMOXIL) 875 MG tablet Take 1 tablet (875 mg total) by mouth 2 (two) times daily.  20 tablet  0  . nystatin-triamcinolone ointment (MYCOLOG) Apply 1 application topically 2 (two) times daily.  30 g  0   No current facility-administered medications on file prior to visit.   Allergies  Allergen Reactions  . Other Swelling    Contrast dye  . Haldol [Haloperidol Decanoate]   . Pravastatin Other (See Comments)    myalgias  . Sulfa Antibiotics  Immunization History  Administered Date(s) Administered  . Influenza Split 08/24/2011, 08/13/2013   Prior to Admission medications   Medication Sig Start Date End Date Taking? Authorizing Provider  albuterol (PROVENTIL HFA;VENTOLIN HFA) 108 (90 BASE) MCG/ACT inhaler Inhale 2 puffs into the lungs every 6 (six) hours as needed for wheezing or shortness of breath. 11/27/13   Vernie Shanks, MD  amoxicillin (AMOXIL) 875 MG tablet Take 1 tablet (875 mg total) by mouth 2 (two) times daily. 11/27/13   Vernie Shanks, MD  beclomethasone (QVAR) 40 MCG/ACT inhaler Inhale 2 puffs into the lungs 2 (two) times daily before a meal. 12/11/13   Vernie Shanks, MD  cyclobenzaprine (FLEXERIL) 10 MG tablet Take 1 tablet (10 mg total) by mouth at bedtime. 08/15/13   Vernie Shanks, MD  fluconazole (DIFLUCAN) 200 MG tablet Take 1 tablet (200 mg total) by mouth daily. 10/16/13   Anastasio Auerbach, MD  fluticasone (FLONASE) 50 MCG/ACT nasal spray Place 2 sprays  into the nose daily. 08/15/13   Vernie Shanks, MD  meloxicam (MOBIC) 15 MG tablet Take 0.5 tablets (7.5 mg total) by mouth daily. 08/15/13   Vernie Shanks, MD  nystatin-triamcinolone ointment [facility]) Apply 1 application topically 2 (two) times daily. 10/16/13   Anastasio Auerbach, MD  Omega-3 Krill Oil 500 MG CAPS Take 1 capsule by mouth daily.    Historical Provider, MD     ROS: As above in the HPI. All other systems are stable or negative.  OBJECTIVE: APPEARANCE:  Patient in no acute distress.The patient appeared well nourished and normally developed. Acyanotic. Waist: VITAL SIGNS:BP 128/82  Pulse 80  Temp(Src) 98.6 F (37 C) (Oral)  Ht $R'5\' 5"'IE$  (1.651 m)  Wt 185 lb (83.915 kg)  BMI 30.79 kg/m2  SpO2 96%  LMP 07/24/2010  WF  SKIN: warm and  Dry without overt rashes, tattoos and scars  HEAD and Neck: without JVD, Head and scalp: normal Eyes:No scleral icterus. Fundi normal, eye movements normal.lower lids puffy. Ears: Auricle normal, canal normal, Tympanic membranes normal, insufflation normal. Nose: nasal congestion and rhinitis Throat: normal Neck & thyroid: normal  CHEST & LUNGS: Chest wall: normal Lungs: Clear  CVS: Reveals the PMI to be normally located. Regular rhythm, First and Second Heart sounds are normal,  absence of murmurs, rubs or gallops. Peripheral vasculature: Radial pulses: normal Dorsal pedis pulses: normal Posterior pulses: normal  ABDOMEN:  Appearance: normal Benign, no organomegaly, no masses, no Abdominal Aortic enlargement. No Guarding , no rebound. No Bruits. Bowel sounds: normal  RECTAL: N/A GU: N/A  EXTREMETIES: nonedematous.  MUSCULOSKELETAL:  Spine: normal Joints: intact  NEUROLOGIC: oriented to time,place and person; nonfocal. Strength is normal Sensory is normal Reflexes are normal Cranial Nerves are normal.   PFTs: normal Scanned into the Media   ASSESSMENT: Bronchospasm - Plan: PR BREATHING CAPACITY  TEST  Pinched nerve in neck - Plan: meloxicam (MOBIC) 15 MG tablet  Sinus congestion  Hyperlipidemia - Plan: CMP14+EGFR, Lipid panel  Allergy  PLAN: OTC claritin  If not wheezing then wean down to 1 puff twice  A day for the next 1 to 2 months. When the pollen season abates then stop inhalers and use rescue as needed. Restart steroid inhaler if significant flare up. Return to see new provider as needed and if flare up occurs frequently during the week after I am gone. Come in fasting tomorrow for fasting labs.       Dr Paula Libra Recommendations  For  nutrition information, I recommend books:  1).Eat to Live by Dr Excell Seltzer. 2).Prevent and Reverse Heart Disease by Dr Karl Luke. 3) Dr Janene Harvey Book:  Program to Reverse Diabetes 4) The [place] Study by T. Heath Gold  Exercise recommendations are:  If unable to walk, then the patient can exercise in a chair 3 times a day. By flapping arms like a bird gently and raising legs outwards to the front.  If ambulatory, the patient can go for walks for 30 minutes 3 times a week. Then increase the intensity and duration as tolerated.  Goal is to try to attain exercise frequency to 5 times a week.  If applicable: Best to perform resistance exercises (machines or weights) 2 days a week and cardio type exercises 3 days per week.   Orders Placed This Encounter  Procedures  . CMP14+EGFR    Standing Status: Future     Number of Occurrences:      Standing Expiration Date: 01/23/2015    Order Specific Question:  Has the patient fasted?    Answer:  Yes  . Lipid panel    Standing Status: Future     Number of Occurrences:      Standing Expiration Date: 01/23/2015    Order Specific Question:  Has the patient fasted?    Answer:  Yes  . PR BREATHING CAPACITY TEST   Meds ordered this encounter  Medications  . meloxicam (MOBIC) 15 MG tablet    Sig: Take 0.5 tablets (7.5 mg total) by mouth daily.    Dispense:  30  tablet    Refill:  1   Medications Discontinued During This Encounter  Medication Reason  . meloxicam (MOBIC) 15 MG tablet Reorder   Return if symptoms worsen or fail to improve.  Dewain Platz P. Jacelyn Grip, M.D.

## 2014-01-22 NOTE — Patient Instructions (Addendum)
If not wheezing then wean down to 1 puff twice  A day for the next 1 to 2 months. When the pollen season abates then stop inhalers and use rescue as needed. Restart steroid inhaler if significant flare up. Return to see new provider as needed and if flare up occurs frequently during the week after I am gone. Come in fasting tomorrow for fasting labs. Ginny Loomer P. Jacelyn Grip, M.D.         Dr Paula Libra Recommendations  For nutrition information, I recommend books:  1).Eat to Live by Dr Excell Seltzer. 2).Prevent and Reverse Heart Disease by Dr Karl Luke. 3) Dr Janene Harvey Book:  Program to Reverse Diabetes 4) The [place] Study by T. Heath Gold  Exercise recommendations are:  If unable to walk, then the patient can exercise in a chair 3 times a day. By flapping arms like a bird gently and raising legs outwards to the front.  If ambulatory, the patient can go for walks for 30 minutes 3 times a week. Then increase the intensity and duration as tolerated.  Goal is to try to attain exercise frequency to 5 times a week.  If applicable: Best to perform resistance exercises (machines or weights) 2 days a week and cardio type exercises 3 days per week.

## 2014-01-23 ENCOUNTER — Ambulatory Visit: Payer: BC Managed Care – PPO | Admitting: Family Medicine

## 2014-01-23 ENCOUNTER — Other Ambulatory Visit (INDEPENDENT_AMBULATORY_CARE_PROVIDER_SITE_OTHER): Payer: BC Managed Care – PPO

## 2014-01-23 DIAGNOSIS — E785 Hyperlipidemia, unspecified: Secondary | ICD-10-CM

## 2014-01-23 NOTE — Progress Notes (Signed)
Pt came in for labs only 

## 2014-01-24 LAB — CMP14+EGFR
ALT: 45 IU/L — ABNORMAL HIGH (ref 0–32)
AST: 27 IU/L (ref 0–40)
Albumin/Globulin Ratio: 1.6 (ref 1.1–2.5)
Albumin: 4.3 g/dL (ref 3.5–5.5)
Alkaline Phosphatase: 109 IU/L (ref 39–117)
BUN/Creatinine Ratio: 21 (ref 9–23)
BUN: 15 mg/dL (ref 6–24)
CO2: 23 mmol/L (ref 18–29)
Calcium: 9.6 mg/dL (ref 8.7–10.2)
Chloride: 101 mmol/L (ref 97–108)
Creatinine, Ser: 0.72 mg/dL (ref 0.57–1.00)
GFR calc Af Amer: 113 mL/min/{1.73_m2} (ref >59)
GFR calc non Af Amer: 98 mL/min/{1.73_m2} (ref >59)
Globulin, Total: 2.7 g/dL (ref 1.5–4.5)
Glucose: 92 mg/dL (ref 65–99)
Potassium: 4.3 mmol/L (ref 3.5–5.2)
Sodium: 140 mmol/L (ref 134–144)
Total Bilirubin: 0.4 mg/dL (ref 0.0–1.2)
Total Protein: 7 g/dL (ref 6.0–8.5)

## 2014-01-24 LAB — LIPID PANEL
Chol/HDL Ratio: 3.9 ratio units (ref 0.0–4.4)
Cholesterol, Total: 232 mg/dL — ABNORMAL HIGH (ref 100–199)
HDL: 60 mg/dL (ref >39)
LDL Calculated: 136 mg/dL — ABNORMAL HIGH (ref 0–99)
Triglycerides: 181 mg/dL — ABNORMAL HIGH (ref 0–149)
VLDL Cholesterol Cal: 36 mg/dL (ref 5–40)

## 2014-01-25 ENCOUNTER — Other Ambulatory Visit: Payer: Self-pay | Admitting: Family Medicine

## 2014-01-25 DIAGNOSIS — R899 Unspecified abnormal finding in specimens from other organs, systems and tissues: Secondary | ICD-10-CM

## 2014-01-25 NOTE — Progress Notes (Signed)
Quick Note:  Call Patient Labs that are abnormal: One liver enzyme was elevated. This can be due to anti-inflammatory use. The good HDLc is high at 60 The total and LDLc is high. But the high HDLc counterbalances the bad. Plant based low fat diet and exercise is recommended. Will need to recheck the liver enzyme in 2 weeks. Ordered in EPIC    ______

## 2014-02-06 ENCOUNTER — Other Ambulatory Visit (INDEPENDENT_AMBULATORY_CARE_PROVIDER_SITE_OTHER): Payer: BC Managed Care – PPO

## 2014-02-06 DIAGNOSIS — R6889 Other general symptoms and signs: Secondary | ICD-10-CM

## 2014-02-06 DIAGNOSIS — R899 Unspecified abnormal finding in specimens from other organs, systems and tissues: Secondary | ICD-10-CM

## 2014-02-06 NOTE — Progress Notes (Signed)
Pt came in for labs only 

## 2014-02-07 LAB — HEPATIC FUNCTION PANEL
ALT: 33 IU/L — ABNORMAL HIGH (ref 0–32)
AST: 25 IU/L (ref 0–40)
Albumin: 4.4 g/dL (ref 3.5–5.5)
Alkaline Phosphatase: 113 IU/L (ref 39–117)
Bilirubin, Direct: 0.1 mg/dL (ref 0.00–0.40)
Total Bilirubin: 0.3 mg/dL (ref 0.0–1.2)
Total Protein: 7.1 g/dL (ref 6.0–8.5)

## 2014-07-17 ENCOUNTER — Encounter: Payer: Self-pay | Admitting: Internal Medicine

## 2014-09-03 ENCOUNTER — Ambulatory Visit (AMBULATORY_SURGERY_CENTER): Payer: Self-pay | Admitting: *Deleted

## 2014-09-03 VITALS — Ht 66.0 in | Wt 183.2 lb

## 2014-09-03 DIAGNOSIS — Z1211 Encounter for screening for malignant neoplasm of colon: Secondary | ICD-10-CM

## 2014-09-03 MED ORDER — MOVIPREP 100 G PO SOLR: ORAL | Status: DC

## 2014-09-03 NOTE — Progress Notes (Signed)
No allergies to eggs or soy. No problems with anesthesia.  Pt given Emmi instructions for colonoscopy  No oxygen use  No diet drug use  

## 2014-09-07 ENCOUNTER — Encounter: Payer: Self-pay | Admitting: Internal Medicine

## 2014-09-18 ENCOUNTER — Encounter: Payer: Self-pay | Admitting: Internal Medicine

## 2014-09-18 ENCOUNTER — Ambulatory Visit (AMBULATORY_SURGERY_CENTER): Payer: BC Managed Care – PPO | Admitting: Internal Medicine

## 2014-09-18 VITALS — BP 98/50 | HR 64 | Temp 98.9°F | Resp 23 | Ht 66.0 in | Wt 183.0 lb

## 2014-09-18 DIAGNOSIS — Z1211 Encounter for screening for malignant neoplasm of colon: Secondary | ICD-10-CM

## 2014-09-18 MED ORDER — SODIUM CHLORIDE 0.9 % IV SOLN: 500.0000 mL | INTRAVENOUS | Status: DC

## 2014-09-18 NOTE — Patient Instructions (Signed)

## 2014-09-18 NOTE — Op Note (Signed)
[place] [facility]  [employer]. [place], [address]   COLONOSCOPY PROCEDURE REPORT  PATIENT: Carla Morales, Carla Morales  MR#: [id number] BIRTHDATE: 1961/10/17 , 78  yrs. old GENDER: female ENDOSCOPIST: Lafayette Dragon, MD REFERRED VK:[id number] Laurance Flatten, M.D. PROCEDURE DATE:  09/18/2014 PROCEDURE:   Colonoscopy, screening First Screening Colonoscopy - Avg.  risk and is 50 yrs.  old or older Yes.  Prior Negative Screening - Now for repeat screening. N/A  History of Adenoma - Now for follow-up colonoscopy & has been > or = to 3 yrs.  N/A  Polyps Removed Today? No. ASA CLASS:   Class I INDICATIONS:average risk for colon cancer. MEDICATIONS: Monitored anesthesia care and Propofol 200 mg IV  DESCRIPTION OF PROCEDURE:   After the risks benefits and alternatives of the procedure were thoroughly explained, informed consent was obtained.  The digital rectal exam revealed no abnormalities of the rectum.   The LB [id number]-[id number]  endoscope was introduced through the anus and advanced to the cecum, which was identified by both the appendix and ileocecal valve. No adverse events experienced.   The quality of the prep was good, using MoviPrep  The instrument was then slowly withdrawn as the colon was fully examined.      COLON FINDINGS: There was mild diverticulosis noted in the sigmoid colon.  Retroflexed views revealed no abnormalities. The time to cecum=4 minutes 38 seconds.  Withdrawal time=6 minutes 20 seconds. The scope was withdrawn and the procedure completed. COMPLICATIONS: There were no immediate complications.  ENDOSCOPIC IMPRESSION: There was mild diverticulosis noted in the sigmoid colon  RECOMMENDATIONS: High-fiber diet Recall colonoscopy in 10 years  eSigned:  Lafayette Dragon, MD 09/18/2014 10:34 AM   cc:

## 2014-09-21 ENCOUNTER — Telehealth: Payer: Self-pay

## 2014-09-21 NOTE — Telephone Encounter (Signed)
  Follow up Call-  Call back number 09/18/2014  Post procedure Call Back phone  # [phone]  Permission to leave phone message Yes     Patient questions:  Do you have a fever, pain , or abdominal swelling? No. Pain Score  0 *  Have you tolerated food without any problems? Yes.    Have you been able to return to your normal activities? Yes.    Do you have any questions about your discharge instructions: Diet   No. Medications  No. Follow up visit  No.  Do you have questions or concerns about your Care? No.  Actions: * If pain score is 4 or above: No action needed, pain <4.

## 2014-10-19 ENCOUNTER — Encounter: Payer: Self-pay | Admitting: Gynecology

## 2014-10-19 ENCOUNTER — Telehealth: Payer: Self-pay | Admitting: *Deleted

## 2014-10-19 ENCOUNTER — Ambulatory Visit (INDEPENDENT_AMBULATORY_CARE_PROVIDER_SITE_OTHER): Payer: BC Managed Care – PPO | Admitting: Gynecology

## 2014-10-19 ENCOUNTER — Other Ambulatory Visit (HOSPITAL_COMMUNITY)
Admission: RE | Admit: 2014-10-19 | Discharge: 2014-10-19 | Disposition: A | Discharge status: Home / Self Care | Payer: BC Managed Care – PPO | Source: Ambulatory Visit | Attending: Gynecology | Admitting: Gynecology

## 2014-10-19 VITALS — BP 120/78 | Ht 65.0 in | Wt 186.0 lb

## 2014-10-19 DIAGNOSIS — Z01419 Encounter for gynecological examination (general) (routine) without abnormal findings: Secondary | ICD-10-CM

## 2014-10-19 DIAGNOSIS — N951 Menopausal and female climacteric states: Secondary | ICD-10-CM

## 2014-10-19 DIAGNOSIS — R32 Unspecified urinary incontinence: Secondary | ICD-10-CM

## 2014-10-19 DIAGNOSIS — Z23 Encounter for immunization: Secondary | ICD-10-CM

## 2014-10-19 MED ORDER — ESTRADIOL 0.05 MG/24HR TD PTTW: 1.0000 | MEDICATED_PATCH | TRANSDERMAL | Status: DC

## 2014-10-19 NOTE — Addendum Note (Signed)
Addended by: Nelva Nay on: 10/19/2014 12:18 PM   Modules accepted: Orders

## 2014-10-19 NOTE — Telephone Encounter (Signed)
-----   Message from Anastasio Auerbach, MD sent at 10/19/2014 10:20 AM EST ----- Schedule an appointment with urology reference urinary incontinence

## 2014-10-19 NOTE — Patient Instructions (Signed)
Office will call you to arrange the urology appointment  Start on the estrogen patch as we discussed. Call me for any issues. Call me within 3 months regardless to let me know how you're doing.  You may obtain a copy of any labs that were done today by logging onto MyChart as outlined in the instructions provided with your AVS (after visit summary). The office will not call with normal lab results but certainly if there are any significant abnormalities then we will contact you.   Health Maintenance, Female A healthy lifestyle and preventative care can promote health and wellness.  Maintain regular health, dental, and eye exams.  Eat a healthy diet. Foods like vegetables, fruits, whole grains, low-fat dairy products, and lean protein foods contain the nutrients you need without too many calories. Decrease your intake of foods high in solid fats, added sugars, and salt. Get information about a proper diet from your caregiver, if necessary.  Regular physical exercise is one of the most important things you can do for your health. Most adults should get at least 150 minutes of moderate-intensity exercise (any activity that increases your heart rate and causes you to sweat) each week. In addition, most adults need muscle-strengthening exercises on 2 or more days a week.   Maintain a healthy weight. The body mass index (BMI) is a screening tool to identify possible weight problems. It provides an estimate of body fat based on height and weight. Your caregiver can help determine your BMI, and can help you achieve or maintain a healthy weight. For adults 20 years and older:  A BMI below 18.5 is considered underweight.  A BMI of 18.5 to 24.9 is normal.  A BMI of 25 to 29.9 is considered overweight.  A BMI of 30 and above is considered obese.  Maintain normal blood lipids and cholesterol by exercising and minimizing your intake of saturated fat. Eat a balanced diet with plenty of fruits and  vegetables. Blood tests for lipids and cholesterol should begin at age 88 and be repeated every 5 years. If your lipid or cholesterol levels are high, you are over 50, or you are a high risk for heart disease, you may need your cholesterol levels checked more frequently.Ongoing high lipid and cholesterol levels should be treated with medicines if diet and exercise are not effective.  If you smoke, find out from your caregiver how to quit. If you do not use tobacco, do not start.  Lung cancer screening is recommended for adults aged 13 80 years who are at high risk for developing lung cancer because of a history of smoking. Yearly low-dose computed tomography (CT) is recommended for people who have at least a 30-pack-year history of smoking and are a current smoker or have quit within the past 15 years. A pack year of smoking is smoking an average of 1 pack of cigarettes a day for 1 year (for example: 1 pack a day for 30 years or 2 packs a day for 15 years). Yearly screening should continue until the smoker has stopped smoking for at least 15 years. Yearly screening should also be stopped for people who develop a health problem that would prevent them from having lung cancer treatment.  If you are pregnant, do not drink alcohol. If you are breastfeeding, be very cautious about drinking alcohol. If you are not pregnant and choose to drink alcohol, do not exceed 1 drink per day. One drink is considered to be 12 ounces (355 mL) of  beer, 5 ounces (148 mL) of wine, or 1.5 ounces (44 mL) of liquor.  Avoid use of street drugs. Do not share needles with anyone. Ask for help if you need support or instructions about stopping the use of drugs.  High blood pressure causes heart disease and increases the risk of stroke. Blood pressure should be checked at least every 1 to 2 years. Ongoing high blood pressure should be treated with medicines, if weight loss and exercise are not effective.  If you are 74 to 51 years  old, ask your caregiver if you should take aspirin to prevent strokes.  Diabetes screening involves taking a blood sample to check your fasting blood sugar level. This should be done once every 3 years, after age 69, if you are within normal weight and without risk factors for diabetes. Testing should be considered at a younger age or be carried out more frequently if you are overweight and have at least 1 risk factor for diabetes.  Breast cancer screening is essential preventative care for women. You should practice "breast self-awareness." This means understanding the normal appearance and feel of your breasts and may include breast self-examination. Any changes detected, no matter how small, should be reported to a caregiver. Women in their 96s and 30s should have a clinical breast exam (CBE) by a caregiver as part of a regular health exam every 1 to 3 years. After age 42, women should have a CBE every year. Starting at age 6, women should consider having a mammogram (breast X-ray) every year. Women who have a family history of breast cancer should talk to their caregiver about genetic screening. Women at a high risk of breast cancer should talk to their caregiver about having an MRI and a mammogram every year.  Breast cancer gene (BRCA)-related cancer risk assessment is recommended for women who have family members with BRCA-related cancers. BRCA-related cancers include breast, ovarian, tubal, and peritoneal cancers. Having family members with these cancers may be associated with an increased risk for harmful changes (mutations) in the breast cancer genes BRCA1 and BRCA2. Results of the assessment will determine the need for genetic counseling and BRCA1 and BRCA2 testing.  The Pap test is a screening test for cervical cancer. Women should have a Pap test starting at age 16. Between ages 85 and 67, Pap tests should be repeated every 2 years. Beginning at age 25, you should have a Pap test every 3 years  as long as the past 3 Pap tests have been normal. If you had a hysterectomy for a problem that was not cancer or a condition that could lead to cancer, then you no longer need Pap tests. If you are between ages 64 and 18, and you have had normal Pap tests going back 10 years, you no longer need Pap tests. If you have had past treatment for cervical cancer or a condition that could lead to cancer, you need Pap tests and screening for cancer for at least 20 years after your treatment. If Pap tests have been discontinued, risk factors (such as a new sexual partner) need to be reassessed to determine if screening should be resumed. Some women have medical problems that increase the chance of getting cervical cancer. In these cases, your caregiver may recommend more frequent screening and Pap tests.  The human papillomavirus (HPV) test is an additional test that may be used for cervical cancer screening. The HPV test looks for the virus that can cause the cell changes on  the cervix. The cells collected during the Pap test can be tested for HPV. The HPV test could be used to screen women aged 69 years and older, and should be used in women of any age who have unclear Pap test results. After the age of 67, women should have HPV testing at the same frequency as a Pap test.  Colorectal cancer can be detected and often prevented. Most routine colorectal cancer screening begins at the age of 68 and continues through age 23. However, your caregiver may recommend screening at an earlier age if you have risk factors for colon cancer. On a yearly basis, your caregiver may provide home test kits to check for hidden blood in the stool. Use of a small camera at the end of a tube, to directly examine the colon (sigmoidoscopy or colonoscopy), can detect the earliest forms of colorectal cancer. Talk to your caregiver about this at age 18, when routine screening begins. Direct examination of the colon should be repeated every 5 to 10  years through age 38, unless early forms of pre-cancerous polyps or small growths are found.  Hepatitis C blood testing is recommended for all people born from [birth year] through 1965 and any individual with known risks for hepatitis C.  Practice safe sex. Use condoms and avoid high-risk sexual practices to reduce the spread of sexually transmitted infections (STIs). Sexually active women aged 77 and younger should be checked for Chlamydia, which is a common sexually transmitted infection. Older women with new or multiple partners should also be tested for Chlamydia. Testing for other STIs is recommended if you are sexually active and at increased risk.  Osteoporosis is a disease in which the bones lose minerals and strength with aging. This can result in serious bone fractures. The risk of osteoporosis can be identified using a bone density scan. Women ages 24 and over and women at risk for fractures or osteoporosis should discuss screening with their caregivers. Ask your caregiver whether you should be taking a calcium supplement or vitamin D to reduce the rate of osteoporosis.  Menopause can be associated with physical symptoms and risks. Hormone replacement therapy is available to decrease symptoms and risks. You should talk to your caregiver about whether hormone replacement therapy is right for you.  Use sunscreen. Apply sunscreen liberally and repeatedly throughout the day. You should seek shade when your shadow is shorter than you. Protect yourself by wearing long sleeves, pants, a wide-brimmed hat, and sunglasses year round, whenever you are outdoors.  Notify your caregiver of new moles or changes in moles, especially if there is a change in shape or color. Also notify your caregiver if a mole is larger than the size of a pencil eraser.  Stay current with your immunizations. Document Released: 05/15/2011 Document Revised: 02/24/2013 Document Reviewed: 05/15/2011 [facility] Patient Information 2014  [place].

## 2014-10-19 NOTE — Progress Notes (Signed)
Carla Morales [birth year] [id number]        53 y.o.  G1P1 for annual exam.  Several issues noted below.  Past medical history,surgical history, problem list, medications, allergies, family history and social history were all reviewed and documented as reviewed in the EPIC chart.  ROS:  12 system ROS performed with pertinent positives and negatives included in the history, assessment and plan.   Additional significant findings :  none   Exam: Kim Counsellor Vitals:   10/19/14 0939  BP: 120/78  Height: 5\' 5"  (1.651 m)  Weight: 186 lb (84.369 kg)   General appearance:  Normal affect, orientation and appearance. Skin: Grossly normal HEENT: Without gross lesions.  No cervical or supraclavicular adenopathy. Thyroid normal.  Lungs:  Clear without wheezing, rales or rhonchi Cardiac: RR, without RMG Abdominal:  Soft, nontender, without masses, guarding, rebound, organomegaly or hernia Breasts:  Examined lying and sitting without masses, retractions, discharge or axillary adenopathy. Pelvic:  Ext/BUS/vagina normal. Pap of cuff done  Adnexa  Without masses or tenderness    Anus and perineum  Normal   Rectovaginal  Normal sphincter tone without palpated masses or tenderness.    Assessment/Plan:  53 y.o. G1P1 female for annual exam.   1. Menopausal symptoms. Patient having hot flushes and night sweats. Also having some foggy thinking and generalized arthralgias. Options for management reviewed to include ERT.  I reviewed the whole issue of ERT with her to include the WHI study with increased risk of stroke, heart attack, DVT and breast cancer. The ACOG and NAMS statements for lowest dose for the shortest period of time reviewed. Transdermal versus oral first-pass effect benefit discussed.  Patient wants to try this and Vivelle 0.05 mg patches prescribed 3 months. She'll call me in the interim to let me know how she is doing. 2. Urinary incontinence. Patient has some mild SUI symptoms. She  has had several occurrences of frank incontinence without significant urgency or stress. Check urinalysis today. Recommend urology evaluation and she agrees to follow up with this and we will help her make this appointment. 3. Mammography 06/2013. Recommend scheduling screening mammogram now and she agrees to do so. SBE monthly reviewed. 4. Pap smear 2012. Pap of cuff done today. Reviewed current screening guidelines. Options to stop screening as she is status post hysterectomy for benign indications versus less frequent screening intervals discussed. Will readdress on an annual basis. 5. Colonoscopy 2015. 6. Health maintenance. No routine blood work done as this is done through her work. She relates that her cholesterol is elevated and she is planning to follow up with her primary in reference to this. Follow up with results from ERT within the next 3 months, otherwise 1 year for annual exam.     Anastasio Auerbach MD, 10:26 AM 10/19/2014

## 2014-10-19 NOTE — Telephone Encounter (Signed)
Referral faxed to [facility] they will fax me back with time and date. 

## 2014-10-20 LAB — URINALYSIS W MICROSCOPIC + REFLEX CULTURE
BACTERIA UA: NONE SEEN
BILIRUBIN URINE: NEGATIVE
CRYSTALS: NONE SEEN
Casts: NONE SEEN
Glucose, UA: NEGATIVE mg/dL
Hgb urine dipstick: NEGATIVE
KETONES UR: NEGATIVE mg/dL
Leukocytes, UA: NEGATIVE
Nitrite: NEGATIVE
PH: 5.5 (ref 5.0–8.0)
Protein, ur: NEGATIVE mg/dL
Specific Gravity, Urine: 1.017 (ref 1.005–1.030)
Squamous Epithelial / LPF: NONE SEEN
Urobilinogen, UA: 0.2 mg/dL (ref 0.0–1.0)

## 2014-10-21 LAB — CYTOLOGY - PAP

## 2014-10-21 NOTE — Telephone Encounter (Signed)
Pt informed with the below. 

## 2014-10-21 NOTE — Telephone Encounter (Signed)
Appointment on 11/19/14 @ 10:00am with Dr.Macdiarmid, left message for pt to call.

## 2014-11-04 ENCOUNTER — Encounter: Payer: Self-pay | Admitting: Family Medicine

## 2014-11-04 ENCOUNTER — Ambulatory Visit (INDEPENDENT_AMBULATORY_CARE_PROVIDER_SITE_OTHER): Payer: BC Managed Care – PPO

## 2014-11-04 ENCOUNTER — Ambulatory Visit (INDEPENDENT_AMBULATORY_CARE_PROVIDER_SITE_OTHER): Payer: BC Managed Care – PPO | Admitting: Family Medicine

## 2014-11-04 VITALS — BP 112/64 | HR 71 | Temp 97.4°F | Ht 65.0 in | Wt 182.0 lb

## 2014-11-04 DIAGNOSIS — Z13820 Encounter for screening for osteoporosis: Secondary | ICD-10-CM

## 2014-11-04 DIAGNOSIS — G589 Mononeuropathy, unspecified: Secondary | ICD-10-CM

## 2014-11-04 DIAGNOSIS — S149XXA Injury of unspecified nerves of neck, initial encounter: Secondary | ICD-10-CM

## 2014-11-04 DIAGNOSIS — M2550 Pain in unspecified joint: Secondary | ICD-10-CM

## 2014-11-04 DIAGNOSIS — M1990 Unspecified osteoarthritis, unspecified site: Secondary | ICD-10-CM

## 2014-11-04 DIAGNOSIS — R0981 Nasal congestion: Secondary | ICD-10-CM

## 2014-11-04 DIAGNOSIS — E785 Hyperlipidemia, unspecified: Secondary | ICD-10-CM

## 2014-11-04 DIAGNOSIS — J9801 Acute bronchospasm: Secondary | ICD-10-CM

## 2014-11-04 DIAGNOSIS — G588 Other specified mononeuropathies: Secondary | ICD-10-CM

## 2014-11-04 LAB — POCT CBC
Granulocyte percent: 63.2 %G (ref 37–80)
HEMATOCRIT: 41.8 % (ref 37.7–47.9)
Hemoglobin: 13.6 g/dL (ref 12.2–16.2)
Lymph, poc: 2.7 (ref 0.6–3.4)
MCH, POC: 29.5 pg (ref 27–31.2)
MCHC: 32.5 g/dL (ref 31.8–35.4)
MCV: 90.8 fL (ref 80–97)
MPV: 7.3 fL (ref 0–99.8)
POC Granulocyte: 5.3 (ref 2–6.9)
POC LYMPH PERCENT: 31.8 %L (ref 10–50)
Platelet Count, POC: 283 10*3/uL (ref 142–424)
RBC: 4.6 M/uL (ref 4.04–5.48)
RDW, POC: 13.1 %
WBC: 8.4 10*3/uL (ref 4.6–10.2)

## 2014-11-04 MED ORDER — ALBUTEROL SULFATE HFA 108 (90 BASE) MCG/ACT IN AERS: 2.0000 | INHALATION_SPRAY | Freq: Four times a day (QID) | RESPIRATORY_TRACT | Status: DC | PRN

## 2014-11-04 MED ORDER — MELOXICAM 15 MG PO TABS: 15.0000 mg | ORAL_TABLET | Freq: Every day | ORAL | Status: DC

## 2014-11-04 MED ORDER — BECLOMETHASONE DIPROPIONATE 40 MCG/ACT IN AERS: 2.0000 | INHALATION_SPRAY | Freq: Two times a day (BID) | RESPIRATORY_TRACT | Status: DC

## 2014-11-04 MED ORDER — CYCLOBENZAPRINE HCL 10 MG PO TABS: 10.0000 mg | ORAL_TABLET | Freq: Every day | ORAL | Status: DC

## 2014-11-04 MED ORDER — FLUTICASONE PROPIONATE 50 MCG/ACT NA SUSP: 2.0000 | Freq: Every day | NASAL | Status: DC

## 2014-11-04 NOTE — Addendum Note (Signed)
Addended by: Earlene Plater on: 11/04/2014 11:02 AM   Modules accepted: Miquel Dunn

## 2014-11-04 NOTE — Progress Notes (Signed)
Subjective:    Patient ID: Carla Morales, female    DOB: 31-Oct-1961, 53 y.o.   MRN: [id number]  HPI Pt here for follow up and management of chronic medical problems. Other than some arthralgias in her joints, the patient is doing well. She has a history of hyperlipidemia frequent respiratory infections and allergies. She also has history of migraine. He is due for lab work today. She had a pelvic exam this month. She is due to get her mammogram and DEXA scan and a chest x-ray also. She will check with her insurance regarding her Prevnar vaccine and all her medications will be refilled. The patient complains of specific pain in her neck and hands knees and especially her right hip. Certain positions in the bed are even blankets on her at nighttime cause her to have more pain. There is no specific joint other than the right hip and the knees.        Patient Active Problem List   Diagnosis Date Noted  . Acute upper respiratory infections of unspecified site 11/27/2013  . Chest pressure 11/27/2013  . Sore throat 11/27/2013  . Bronchospasm 11/27/2013  . Allergy   . Hyperlipidemia   . Migraine   . Pinched nerve in neck   . Sinus congestion    Outpatient Encounter Prescriptions as of 11/04/2014  Medication Sig  . albuterol (PROVENTIL HFA;VENTOLIN HFA) 108 (90 BASE) MCG/ACT inhaler Inhale 2 puffs into the lungs every 6 (six) hours as needed for wheezing or shortness of breath.  . beclomethasone (QVAR) 40 MCG/ACT inhaler Inhale 2 puffs into the lungs 2 (two) times daily before a meal.  . cyclobenzaprine (FLEXERIL) 10 MG tablet Take 1 tablet (10 mg total) by mouth at bedtime.  Marland Kitchen estradiol (MINIVELLE) 0.05 MG/24HR patch Place 1 patch (0.05 mg total) onto the skin 2 (two) times a week.  . fluticasone (FLONASE) 50 MCG/ACT nasal spray Place 2 sprays into the nose daily.  . Loratadine (CLARITIN PO) Take by mouth daily.  . meloxicam (MOBIC) 15 MG tablet Take 0.5 tablets (7.5 mg total) by mouth  daily.  . Multiple Vitamin (MULTIVITAMIN) tablet Take 1 tablet by mouth daily.  . Omega-3 Krill Oil 500 MG CAPS Take 1 capsule by mouth daily.    Review of Systems  Constitutional: Negative.   HENT: Negative.   Eyes: Negative.   Respiratory: Negative.   Cardiovascular: Negative.   Gastrointestinal: Negative.   Endocrine: Negative.   Genitourinary: Negative.   Musculoskeletal: Positive for arthralgias (joint pains).  Skin: Negative.   Allergic/Immunologic: Negative.   Neurological: Negative.   Hematological: Negative.   Psychiatric/Behavioral: Negative.        Objective:   Physical Exam  Constitutional: She is oriented to person, place, and time. She appears well-developed and well-nourished. No distress.  Patient is pleasant, and alert. She is concerned about her arthralgias and joint pain especially in light that her mother has rheumatoid arthritis and that arthritis in general runs in her family.  HENT:  Head: Normocephalic and atraumatic.  Right Ear: External ear normal.  Left Ear: External ear normal.  Mouth/Throat: Oropharynx is clear and moist. No oropharyngeal exudate.  Nasal congestion bilaterally  Eyes: Conjunctivae and EOM are normal. Pupils are equal, round, and reactive to light. Right eye exhibits no discharge. Left eye exhibits no discharge. No scleral icterus.  Neck: Normal range of motion. Neck supple. No thyromegaly present.  There is tenderness in the posterior neck and cervical area to palpation. There are  no carotid bruits or thyromegaly.  Cardiovascular: Normal rate, regular rhythm, normal heart sounds and intact distal pulses.   No murmur heard. Heart has a regular rate and rhythm at 72/m  Pulmonary/Chest: Effort normal and breath sounds normal. No respiratory distress. She has no wheezes. She has no rales. She exhibits no tenderness.  Abdominal: Soft. Bowel sounds are normal. She exhibits no mass. There is no tenderness. There is no rebound and no  guarding.  The abdomen is nontender without masses or organ enlargement or inguinal adenopathy.  Musculoskeletal: Normal range of motion. She exhibits no edema or tenderness.  There was tenderness in the posterior neck area and leg raising and hip abduction were good bilaterally. There was no specific joint tenderness to palpation.  Lymphadenopathy:    She has no cervical adenopathy.  Neurological: She is alert and oriented to person, place, and time. She has normal reflexes. No cranial nerve deficit.  Reflexes both upper and lower were equal bilaterally.  Skin: Skin is warm and dry. No rash noted. No erythema.  Psychiatric: She has a normal mood and affect. Her behavior is normal. Judgment and thought content normal.  Nursing note and vitals reviewed.  BP 112/64 mmHg  Pulse 71  Temp(Src) 97.4 F (36.3 C) (Oral)  Ht 5' 5" (1.651 m)  Wt 182 lb (82.555 kg)  BMI 30.29 kg/m2  LMP 07/24/2010  [facility] reading (PRIMARY) by  Dr. Brunilda Payor x-ray--no active disease                                       Assessment & Plan:  1. Hyperlipidemia - POCT CBC - BMP8+EGFR - Hepatic function panel - NMR, lipoprofile - Vit D  25 hydroxy (rtn osteoporosis monitoring) - DG Chest 2 View; Future  2. Joint pain - POCT CBC - BMP8+EGFR - Hepatic function panel - Vit D  25 hydroxy (rtn osteoporosis monitoring) - DG Chest 2 View; Future - DG Bone Density; Future  3. Arthritis - POCT CBC - BMP8+EGFR - Hepatic function panel - Vit D  25 hydroxy (rtn osteoporosis monitoring) - DG Chest 2 View; Future - DG Bone Density; Future  4. Screening for osteoporosis - DG Bone Density; Future  5. Bronchospasm - beclomethasone (QVAR) 40 MCG/ACT inhaler; Inhale 2 puffs into the lungs 2 (two) times daily before a meal.  Dispense: 1 Inhaler; Refill: 11 - albuterol (PROVENTIL HFA;VENTOLIN HFA) 108 (90 BASE) MCG/ACT inhaler; Inhale 2 puffs into the lungs every 6 (six) hours as needed for wheezing or shortness  of breath.  Dispense: 1 Inhaler; Refill: 11  6. Pinched nerve in neck - cyclobenzaprine (FLEXERIL) 10 MG tablet; Take 1 tablet (10 mg total) by mouth at bedtime.  Dispense: 30 tablet; Refill: 3 - meloxicam (MOBIC) 15 MG tablet; Take 1 tablet (15 mg total) by mouth daily.  Dispense: 30 tablet; Refill: 2  7. Sinus congestion - fluticasone (FLONASE) 50 MCG/ACT nasal spray; Place 2 sprays into both nostrils daily.  Dispense: 16 g; Refill: 11   Patient Instructions  Continue current medications. Continue good therapeutic lifestyle changes which include good diet and exercise. Fall precautions discussed with patient. If an FOBT was given today- please return it to our front desk. If you are over 57 years old - you may need Prevnar 65 or the adult Pneumonia vaccine.  Flu Shots will be available at our office starting mid- September. Please call and  schedule a FLU CLINIC APPOINTMENT.   We will call you with your lab work results and chest x-ray results as soon as those results become available   Arrie Senate MD

## 2014-11-04 NOTE — Patient Instructions (Addendum)
Continue current medications. Continue good therapeutic lifestyle changes which include good diet and exercise. Fall precautions discussed with patient. If an FOBT was given today- please return it to our front desk. If you are over 51 years old - you may need Prevnar 28 or the adult Pneumonia vaccine.  Flu Shots will be available at our office starting mid- September. Please call and schedule a FLU CLINIC APPOINTMENT.   We will call you with your lab work results and chest x-ray results as soon as those results become available

## 2014-11-06 LAB — BMP8+EGFR
BUN/Creatinine Ratio: 18 (ref 9–23)
BUN: 12 mg/dL (ref 6–24)
CO2: 24 mmol/L (ref 18–29)
Calcium: 9.6 mg/dL (ref 8.7–10.2)
Chloride: 101 mmol/L (ref 97–108)
Creatinine, Ser: 0.66 mg/dL (ref 0.57–1.00)
GFR calc Af Amer: 118 mL/min/{1.73_m2} (ref >59)
GFR, EST NON AFRICAN AMERICAN: 103 mL/min/{1.73_m2} (ref >59)
Glucose: 91 mg/dL (ref 65–99)
Potassium: 4.6 mmol/L (ref 3.5–5.2)
Sodium: 139 mmol/L (ref 134–144)

## 2014-11-06 LAB — HEPATIC FUNCTION PANEL
ALBUMIN: 4.5 g/dL (ref 3.5–5.5)
ALT: 26 IU/L (ref 0–32)
AST: 24 IU/L (ref 0–40)
Alkaline Phosphatase: 89 IU/L (ref 39–117)
BILIRUBIN DIRECT: 0.09 mg/dL (ref 0.00–0.40)
BILIRUBIN TOTAL: 0.4 mg/dL (ref 0.0–1.2)
TOTAL PROTEIN: 7.1 g/dL (ref 6.0–8.5)

## 2014-11-06 LAB — NMR, LIPOPROFILE
CHOLESTEROL: 222 mg/dL — AB (ref 100–199)
HDL CHOLESTEROL BY NMR: 56 mg/dL (ref >39)
HDL Particle Number: 32.3 umol/L (ref >=30.5)
LDL Particle Number: 1662 nmol/L — ABNORMAL HIGH (ref <1000)
LDL Size: 20.9 nm (ref >20.5)
LDL-C: 129 mg/dL — ABNORMAL HIGH (ref 0–99)
LP-IR Score: 62 — ABNORMAL HIGH (ref <=45)
Small LDL Particle Number: 473 nmol/L (ref <=527)
TRIGLYCERIDES BY NMR: 187 mg/dL — AB (ref 0–149)

## 2014-11-06 LAB — ARTHRITIS PANEL
ANA: NEGATIVE
Rhuematoid fact SerPl-aCnc: 8.8 IU/mL (ref 0.0–13.9)
SED RATE: 4 mm/h (ref 0–40)
URIC ACID: 4 mg/dL (ref 2.5–7.1)

## 2014-11-06 LAB — CYCLIC CITRUL PEPTIDE ANTIBODY, IGG/IGA: Cyclic Citrullin Peptide Ab: 3 units (ref 0–19)

## 2014-11-06 LAB — VITAMIN D 25 HYDROXY (VIT D DEFICIENCY, FRACTURES): Vit D, 25-Hydroxy: 30 ng/mL (ref 30.0–100.0)

## 2014-11-24 ENCOUNTER — Other Ambulatory Visit: Payer: Self-pay

## 2014-11-24 DIAGNOSIS — Z1231 Encounter for screening mammogram for malignant neoplasm of breast: Secondary | ICD-10-CM

## 2014-11-25 ENCOUNTER — Telehealth: Payer: Self-pay | Admitting: Physician Assistant

## 2014-11-25 DIAGNOSIS — B9689 Other specified bacterial agents as the cause of diseases classified elsewhere: Secondary | ICD-10-CM

## 2014-11-25 DIAGNOSIS — J019 Acute sinusitis, unspecified: Principal | ICD-10-CM

## 2014-11-25 MED ORDER — AMOXICILLIN-POT CLAVULANATE 875-125 MG PO TABS: 1.0000 | ORAL_TABLET | Freq: Two times a day (BID) | ORAL | Status: DC

## 2014-11-25 NOTE — Progress Notes (Signed)

## 2014-11-30 ENCOUNTER — Ambulatory Visit
Admission: RE | Admit: 2014-11-30 | Discharge: 2014-11-30 | Disposition: A | Discharge status: Home / Self Care | Payer: BLUE CROSS/BLUE SHIELD | Source: Ambulatory Visit

## 2014-11-30 DIAGNOSIS — Z1231 Encounter for screening mammogram for malignant neoplasm of breast: Secondary | ICD-10-CM

## 2014-12-08 ENCOUNTER — Other Ambulatory Visit: Payer: Self-pay | Admitting: Gynecology

## 2014-12-08 ENCOUNTER — Telehealth: Payer: Self-pay | Admitting: *Deleted

## 2014-12-08 MED ORDER — ESTRADIOL 0.075 MG/24HR TD PTTW: 1.0000 | MEDICATED_PATCH | TRANSDERMAL | Status: DC

## 2014-12-08 NOTE — Telephone Encounter (Signed)
MiniVivelle 0.075 mg patch #8 apply twice weekly refill through next annual exam

## 2014-12-08 NOTE — Telephone Encounter (Signed)
Patient informed. Rx sent 

## 2014-12-08 NOTE — Telephone Encounter (Signed)
Pt called requesting increase on minivelle patch 0.05 mg still having some slight menopausal symptoms. Please advise

## 2014-12-30 ENCOUNTER — Encounter: Payer: Self-pay | Admitting: Pharmacist

## 2014-12-30 ENCOUNTER — Ambulatory Visit (INDEPENDENT_AMBULATORY_CARE_PROVIDER_SITE_OTHER): Payer: BLUE CROSS/BLUE SHIELD | Admitting: Pharmacist

## 2014-12-30 ENCOUNTER — Other Ambulatory Visit: Payer: Self-pay | Admitting: Family Medicine

## 2014-12-30 ENCOUNTER — Ambulatory Visit (INDEPENDENT_AMBULATORY_CARE_PROVIDER_SITE_OTHER): Payer: BLUE CROSS/BLUE SHIELD

## 2014-12-30 VITALS — Ht 66.0 in | Wt 186.0 lb

## 2014-12-30 DIAGNOSIS — M2550 Pain in unspecified joint: Secondary | ICD-10-CM

## 2014-12-30 DIAGNOSIS — M1990 Unspecified osteoarthritis, unspecified site: Secondary | ICD-10-CM

## 2014-12-30 DIAGNOSIS — Z780 Asymptomatic menopausal state: Secondary | ICD-10-CM

## 2014-12-30 NOTE — Progress Notes (Signed)
Patient ID: Carla Morales, female   DOB: 21-Jan-1961, 53 y.o.   MRN: [id number]  [facility] Current Height: Height: 5\' 6"  (167.6 cm)      Max Lifetime Height:  5\' 6"  Current Weight: Weight: 186 lb (84.369 kg)       Ethnicity:Caucasian    HPI: Does pt already have a diagnosis of:  Osteopenia?  No Osteoporosis?  No  Back Pain?  No       Kyphosis?  No Prior fracture?  No Med(s) for Osteoporosis/Osteopenia:  none Med(s) previously tried for Osteoporosis/Osteopenia:  none                                                             PMH: Age at menopause:  Currently / 54 yo Hysterectomy?  Yes Oophorectomy?  No HRT? Yes - Current.  Type/duration: estradiol patch Steroid Use?  No Thyroid med?  No History of cancer?  No History of digestive disorders (ie Crohn's)?  No Current or previous eating disorders?  No Last Vitamin D Result:  30 (11/04/2014) Last GFR Result:  103 (11/04/2014)   FH/SH: Family history of osteoporosis?  No Parent with history of hip fracture?  No Family history of breast cancer?  No Exercise?  No Smoking?  No Alcohol?  no    Calcium Assessment Calcium Intake  # of servings/day  Calcium mg  Milk (8 oz) 0  x  300  = 0  Yogurt (4 oz) 0.5 100mg  50mg   Cheese (1 oz) 1 x  200 = 200mg   Other Calcium sources   250mg   Ca supplement 0 = 0   Estimated calcium intake per day 500mg     DEXA Results Date of Test T-Score for AP Spine L1-L4 T-Score for Total Left Hip T-Score for Total Right Hip  12/30/2014 -0.8 -0.1 -0.4                  Assessment: Normal BMD in postmenopausal female  Recommendations: 1.   Discussed BMD  / DEXA results and discussed fracture risk.  Continue estradiol patches 2.  recommend calcium 1200mg  daily through supplementation or diet.  3.  recommend weight bearing exercise - 30 minutes at least 4 days per week.   4.  Counseled and educated about fall risk and prevention.  Recheck DEXA:  2 years  Time spent counseling  patient:  30 minutes   Cherre Robins, PharmD, CPP

## 2014-12-30 NOTE — Patient Instructions (Signed)
Exercise for Strong Bones  Exercise is important to build and maintain strong bones / bone density.  There are 2 types of exercises that are important to building and maintaining strong bones:  Weight- bearing and muscle-stregthening.  Weight-bearing Exercises  These exercises include activities that make you move against gravity while staying upright. Weight-bearing exercises can be high-impact or low-impact.  High-impact weight-bearing exercises help build bones and keep them strong. If you have broken a bone due to osteoporosis or are at risk of breaking a bone, you may need to avoid high-impact exercises. If you're not sure, you should check with your healthcare provider.  Examples of high-impact weight-bearing exercises are: Dancing  Doing high-impact aerobics  Hiking  Jogging/running  Jumping Rope  Stair climbing  Tennis  Low-impact weight-bearing exercises can also help keep bones strong and are a safe alternative if you cannot do high-impact exercises.   Examples of low-impact weight-bearing exercises are: Using elliptical training machines  Doing low-impact aerobics  Using stair-step machines  Fast walking on a treadmill or outside   Muscle-Strengthening Exercises These exercises include activities where you move your body, a weight or some other resistance against gravity. They are also known as resistance exercises and include: Lifting weights  Using elastic exercise bands  Using weight machines  Lifting your own body weight  Functional movements, such as standing and rising up on your toes  Yoga and Pilates can also improve strength, balance and flexibility. However, certain positions may not be safe for people with osteoporosis or those at increased risk of broken bones. For example, exercises that have you bend forward may increase the chance of breaking a bone in the spine.   Non-Impact Exercises There are other types of exercises that can help prevent falls.   Non-impact exercises can help you to improve balance, posture and how well you move in everyday activities. Some of these exercises include: Balance exercises that strengthen your legs and test your balance, such as Tai Chi, can decrease your risk of falls.  Posture exercises that improve your posture and reduce rounded or "sloping" shoulders can help you decrease the chance of breaking a bone, especially in the spine.  Functional exercises that improve how well you move can help you with everyday activities and decrease your chance of falling and breaking a bone. For example, if you have trouble getting up from a chair or climbing stairs, you should do these activities as exercises.   **A physical therapist can teach you balance, posture and functional exercises. He/she can also help you learn which exercises are safe and appropriate for you.  Carla Morales has a physical therapy office in [place] in front of our office and referrals can be made for assessments and treatment as needed and strength and balance training.  If you would like to have an assessment with [name] and our physical therapy team please let a nurse or provider know.    

## 2015-03-24 ENCOUNTER — Encounter: Payer: Self-pay | Admitting: Family Medicine

## 2015-03-24 ENCOUNTER — Ambulatory Visit (INDEPENDENT_AMBULATORY_CARE_PROVIDER_SITE_OTHER): Payer: BLUE CROSS/BLUE SHIELD | Admitting: Family Medicine

## 2015-03-24 VITALS — BP 109/65 | HR 71 | Temp 98.2°F | Ht 66.0 in | Wt 184.0 lb

## 2015-03-24 DIAGNOSIS — E785 Hyperlipidemia, unspecified: Secondary | ICD-10-CM

## 2015-03-24 DIAGNOSIS — N3946 Mixed incontinence: Secondary | ICD-10-CM

## 2015-03-24 DIAGNOSIS — T7840XS Allergy, unspecified, sequela: Secondary | ICD-10-CM

## 2015-03-24 DIAGNOSIS — G588 Other specified mononeuropathies: Secondary | ICD-10-CM

## 2015-03-24 DIAGNOSIS — Z23 Encounter for immunization: Secondary | ICD-10-CM | POA: Diagnosis not present

## 2015-03-24 DIAGNOSIS — Z780 Asymptomatic menopausal state: Secondary | ICD-10-CM | POA: Diagnosis not present

## 2015-03-24 DIAGNOSIS — G589 Mononeuropathy, unspecified: Secondary | ICD-10-CM

## 2015-03-24 DIAGNOSIS — S149XXA Injury of unspecified nerves of neck, initial encounter: Secondary | ICD-10-CM

## 2015-03-24 LAB — POCT CBC
Granulocyte percent: 65.4 %G (ref 37–80)
HCT, POC: 41.6 % (ref 37.7–47.9)
Hemoglobin: 13 g/dL (ref 12.2–16.2)
LYMPH, POC: 2.1 (ref 0.6–3.4)
MCH, POC: 28.3 pg (ref 27–31.2)
MCHC: 31.3 g/dL — AB (ref 31.8–35.4)
MCV: 90.3 fL (ref 80–97)
MPV: 7.3 fL (ref 0–99.8)
PLATELET COUNT, POC: 277 10*3/uL (ref 142–424)
POC GRANULOCYTE: 5 (ref 2–6.9)
POC LYMPH %: 27.4 % (ref 10–50)
RBC: 4.6 M/uL (ref 4.04–5.48)
RDW, POC: 12.8 %
WBC: 7.6 10*3/uL (ref 4.6–10.2)

## 2015-03-24 NOTE — Addendum Note (Signed)
Addended by: Zannie Cove on: 03/24/2015 12:00 PM   Modules accepted: Orders

## 2015-03-24 NOTE — Progress Notes (Signed)
Subjective:    Patient ID: Carla Morales, female    DOB: November 11, 1961, 54 y.o.   MRN: [id number]  HPI Pt here for follow up and management of chronic medical problems which includes hyperlipidemia. She is taking medications regularly. The patient says that she is not in is good shape physically as she knows that she needs to be an she will try to work on more exercise. She has problems with her neck and alleviates these by having her computer more in front of her than looking down at it. She sees her gynecologist regularly because her mom has a history of breast cancer and she gets her mammograms regularly. She denies chest pain or shortness of breath unless she has allergies and the day contributing to her shortness of breath more. She has occasional heartburn and she attributes this to stress and eating habits. She does have some incontinence and she is seen the urologist about this but preferred not to start the medication because of the medication side effects of constipation.        Patient Active Problem List   Diagnosis Date Noted  . Acute upper respiratory infections of unspecified site 11/27/2013  . Chest pressure 11/27/2013  . Sore throat 11/27/2013  . Bronchospasm 11/27/2013  . Allergy   . Hyperlipidemia   . Migraine   . Pinched nerve in neck   . Sinus congestion    Outpatient Encounter Prescriptions as of 03/24/2015  Medication Sig  . Cholecalciferol (VITAMIN D-3) 1000 UNITS CAPS Take 1,000 Units by mouth daily.  . cyclobenzaprine (FLEXERIL) 10 MG tablet Take 1 tablet (10 mg total) by mouth at bedtime.  Marland Kitchen estradiol (VIVELLE-DOT) 0.075 MG/24HR Place 1 patch onto the skin 2 (two) times a week.  . fluticasone (FLONASE) 50 MCG/ACT nasal spray Place 2 sprays into both nostrils daily.  . Loratadine (CLARITIN PO) Take 1 tablet by mouth daily.   . Omega-3 Krill Oil 500 MG CAPS Take 1 capsule by mouth daily.  Marland Kitchen albuterol (PROVENTIL HFA;VENTOLIN HFA) 108 (90 BASE) MCG/ACT inhaler  Inhale 2 puffs into the lungs every 6 (six) hours as needed for wheezing or shortness of breath. (Patient not taking: Reported on 03/24/2015)  . beclomethasone (QVAR) 40 MCG/ACT inhaler Inhale 2 puffs into the lungs 2 (two) times daily before a meal. (Patient not taking: Reported on 03/24/2015)  . meloxicam (MOBIC) 15 MG tablet Take 1 tablet (15 mg total) by mouth daily. (Patient not taking: Reported on 03/24/2015)  . Multiple Vitamin (MULTIVITAMIN) tablet Take 1 tablet by mouth daily.   No facility-administered encounter medications on file as of 03/24/2015.     Review of Systems  Constitutional: Negative.   HENT: Negative.   Eyes: Negative.   Respiratory: Negative.   Cardiovascular: Negative.   Gastrointestinal: Negative.   Endocrine: Negative.   Genitourinary: Negative.   Musculoskeletal: Negative.   Skin: Negative.   Allergic/Immunologic: Negative.   Neurological: Negative.   Hematological: Negative.   Psychiatric/Behavioral: Negative.        Objective:   Physical Exam  Constitutional: She is oriented to person, place, and time. She appears well-developed and well-nourished. No distress.  Pleasant and alert  HENT:  Head: Normocephalic and atraumatic.  Right Ear: External ear normal.  Left Ear: External ear normal.  Mouth/Throat: Oropharynx is clear and moist. No oropharyngeal exudate.  Minimal nasal congestion right greater than left  Eyes: Conjunctivae and EOM are normal. Pupils are equal, round, and reactive to light. Right eye exhibits no  discharge. Left eye exhibits no discharge. No scleral icterus.  Neck: Normal range of motion. Neck supple. No thyromegaly present.  No anterior cervical adenopathy or carotid bruits  Cardiovascular: Normal rate, regular rhythm, normal heart sounds and intact distal pulses.  Exam reveals no gallop and no friction rub.   No murmur heard. At 72/m  Pulmonary/Chest: Effort normal and breath sounds normal. No respiratory distress. She has no  wheezes. She has no rales. She exhibits no tenderness.  Abdominal: Soft. Bowel sounds are normal. She exhibits no mass. There is no tenderness. There is no rebound and no guarding.  Musculoskeletal: Normal range of motion. She exhibits no edema or tenderness.  Lymphadenopathy:    She has no cervical adenopathy.  Neurological: She is alert and oriented to person, place, and time. She has normal reflexes. No cranial nerve deficit.  Skin: Skin is warm and dry. No rash noted.  Psychiatric: She has a normal mood and affect. Her behavior is normal. Judgment and thought content normal.  Nursing note and vitals reviewed.  BP 109/65 mmHg  Pulse 71  Temp(Src) 98.2 F (36.8 C) (Oral)  Ht $R'5\' 6"'AE$  (1.676 m)  Wt 184 lb (83.462 kg)  BMI 29.71 kg/m2  LMP 07/24/2010        Assessment & Plan:  1. Postmenopausal -Continue follow-up with gynecology and get yearly mammograms - POCT CBC - Vit D  25 hydroxy (rtn osteoporosis monitoring)  2. Hyperlipidemia -Continue with omega-3 fatty acids and more regular exercise - POCT CBC - BMP8+EGFR - Hepatic function panel - NMR, lipoprofile  3. Mixed incontinence -Continue follow-up with urology as needed  4. Pinched nerve in neck -Try to maintain good posture a sure doing so that you're neck pain is not aggravated  5. Allergy, sequela -Continue to use nasal inhalers and lung inhalers as needed and especially during the seasons when you have more problems  Patient Instructions  Continue current medications. Continue good therapeutic lifestyle changes which include good diet and exercise. Fall precautions discussed with patient. If an FOBT was given today- please return it to our front desk. If you are over 101 years old - you may need Prevnar 75 or the adult Pneumonia vaccine.  Flu Shots are still available at our office. If you still haven't had one please call to set up a nurse visit to get one.   After your visit with [place] today you will receive a  survey in the mail or online from [employer] regarding your care with [place]. Please take a moment to fill this out. Your feedback is very important to [place] as you can help [place] better understand your patient needs as well as improve your experience and satisfaction. WE CARE ABOUT YOU!!!   Try to get more exercise Check with your insurance regarding the Prevnar vaccine and the shingles shot Remember to get your colonoscopy every 10 years We will try to get an exercise treadmill test in the next year for routine purposes Continue with vitamin D and omega-3 fatty acids Chest x-ray every 2-3 years FOBT yearly Pelvic exam and mammogram yearly   Arrie Senate MD

## 2015-03-24 NOTE — Patient Instructions (Addendum)
Continue current medications. Continue good therapeutic lifestyle changes which include good diet and exercise. Fall precautions discussed with patient. If an FOBT was given today- please return it to our front desk. If you are over 52 years old - you may need Prevnar 53 or the adult Pneumonia vaccine.  Flu Shots are still available at our office. If you still haven't had one please call to set up a nurse visit to get one.   After your visit with [place] today you will receive a survey in the mail or online from [employer] regarding your care with [place]. Please take a moment to fill this out. Your feedback is very important to [place] as you can help [place] better understand your patient needs as well as improve your experience and satisfaction. WE CARE ABOUT YOU!!!   Try to get more exercise Check with your insurance regarding the Prevnar vaccine and the shingles shot Remember to get your colonoscopy every 10 years We will try to get an exercise treadmill test in the next year for routine purposes Continue with vitamin D and omega-3 fatty acids Chest x-ray every 2-3 years FOBT yearly Pelvic exam and mammogram yearly

## 2015-03-25 LAB — NMR, LIPOPROFILE
Cholesterol: 215 mg/dL — ABNORMAL HIGH (ref 100–199)
HDL Cholesterol by NMR: 53 mg/dL (ref >39)
HDL Particle Number: 32.3 umol/L (ref >=30.5)
LDL Particle Number: 1453 nmol/L — ABNORMAL HIGH (ref <1000)
LDL Size: 20.6 nm (ref >20.5)
LDL-C: 120 mg/dL — ABNORMAL HIGH (ref 0–99)
LP-IR Score: 59 — ABNORMAL HIGH (ref <=45)
Small LDL Particle Number: 699 nmol/L — ABNORMAL HIGH (ref <=527)
Triglycerides by NMR: 209 mg/dL — ABNORMAL HIGH (ref 0–149)

## 2015-03-25 LAB — BMP8+EGFR
BUN/Creatinine Ratio: 19 (ref 9–23)
BUN: 13 mg/dL (ref 6–24)
CO2: 22 mmol/L (ref 18–29)
Calcium: 9.1 mg/dL (ref 8.7–10.2)
Chloride: 101 mmol/L (ref 97–108)
Creatinine, Ser: 0.67 mg/dL (ref 0.57–1.00)
GFR calc Af Amer: 118 mL/min/{1.73_m2} (ref >59)
GFR calc non Af Amer: 102 mL/min/{1.73_m2} (ref >59)
Glucose: 89 mg/dL (ref 65–99)
Potassium: 4.2 mmol/L (ref 3.5–5.2)
Sodium: 138 mmol/L (ref 134–144)

## 2015-03-25 LAB — HEPATIC FUNCTION PANEL
ALT: 21 IU/L (ref 0–32)
AST: 18 IU/L (ref 0–40)
Albumin: 4.2 g/dL (ref 3.5–5.5)
Alkaline Phosphatase: 85 IU/L (ref 39–117)
Bilirubin Total: 0.5 mg/dL (ref 0.0–1.2)
Bilirubin, Direct: 0.09 mg/dL (ref 0.00–0.40)
Total Protein: 7.1 g/dL (ref 6.0–8.5)

## 2015-03-25 LAB — VITAMIN D 25 HYDROXY (VIT D DEFICIENCY, FRACTURES): VIT D 25 HYDROXY: 27.5 ng/mL — AB (ref 30.0–100.0)

## 2015-03-26 ENCOUNTER — Telehealth: Payer: Self-pay | Admitting: *Deleted

## 2015-03-26 MED ORDER — VITAMIN D (ERGOCALCIFEROL) 1.25 MG (50000 UNIT) PO CAPS: 50000.0000 [IU] | ORAL_CAPSULE | ORAL | Status: DC

## 2015-03-26 NOTE — Telephone Encounter (Signed)
-----   Message from Zannie Cove, LPN sent at 5/75/0518  7:49 AM EDT -----   ----- Message -----    From: Chipper Herb, MD    Sent: 03/25/2015   8:52 PM      To: Cleda Clarks Bullins, LPN  The blood sugar is good at 89. The creatinine, the most important kidney function test is within normal limits. The electrolytes including potassium are good. All liver function tests are within normal limits Cholesterol numbers with advanced lipid testing remain elevated but not quite as high as 4 months ago. The total LDL particle number is 1453. This should be less than 1000. The LDL C is elevated at 120 and should be less than 100. The triglycerides are elevated at 209 and should be less than 150. The good cholesterol or the HDL particle number is within normal limits.-----+++++ please schedule this patient for a visit with the clinical pharmacist so that another statin drug can be tried like Crestor or Livalo-----continue aggressive therapeutic lifestyle changes The vitamin D level is low at 27.5.----- please call a prescription in for vitamin D 50,000 units #12 1 weekly with 1 refill and repeat the vitamin D level in 3-4 months.

## 2015-05-26 ENCOUNTER — Other Ambulatory Visit: Payer: Self-pay

## 2015-05-26 MED ORDER — ESTRADIOL 0.075 MG/24HR TD PTTW: 1.0000 | MEDICATED_PATCH | TRANSDERMAL | Status: DC

## 2015-07-13 ENCOUNTER — Other Ambulatory Visit: Payer: BLUE CROSS/BLUE SHIELD

## 2015-07-31 ENCOUNTER — Telehealth: Payer: Self-pay | Admitting: Nurse Practitioner

## 2015-07-31 DIAGNOSIS — J0110 Acute frontal sinusitis, unspecified: Secondary | ICD-10-CM

## 2015-07-31 MED ORDER — AZITHROMYCIN 250 MG PO TABS: ORAL_TABLET | ORAL | Status: DC

## 2015-07-31 NOTE — Progress Notes (Signed)

## 2015-09-24 ENCOUNTER — Encounter: Payer: Self-pay | Admitting: Family Medicine

## 2015-09-24 ENCOUNTER — Ambulatory Visit (INDEPENDENT_AMBULATORY_CARE_PROVIDER_SITE_OTHER): Payer: BLUE CROSS/BLUE SHIELD | Admitting: Family Medicine

## 2015-09-24 VITALS — BP 118/84 | HR 72 | Temp 98.3°F | Ht 66.0 in | Wt 180.0 lb

## 2015-09-24 DIAGNOSIS — M542 Cervicalgia: Secondary | ICD-10-CM | POA: Diagnosis not present

## 2015-09-24 DIAGNOSIS — E785 Hyperlipidemia, unspecified: Secondary | ICD-10-CM

## 2015-09-24 DIAGNOSIS — E559 Vitamin D deficiency, unspecified: Secondary | ICD-10-CM | POA: Diagnosis not present

## 2015-09-24 DIAGNOSIS — Z23 Encounter for immunization: Secondary | ICD-10-CM | POA: Diagnosis not present

## 2015-09-24 DIAGNOSIS — R198 Other specified symptoms and signs involving the digestive system and abdomen: Secondary | ICD-10-CM

## 2015-09-24 DIAGNOSIS — R6889 Other general symptoms and signs: Secondary | ICD-10-CM

## 2015-09-24 DIAGNOSIS — R0989 Other specified symptoms and signs involving the circulatory and respiratory systems: Secondary | ICD-10-CM

## 2015-09-24 DIAGNOSIS — G8929 Other chronic pain: Secondary | ICD-10-CM | POA: Diagnosis not present

## 2015-09-24 DIAGNOSIS — Z780 Asymptomatic menopausal state: Secondary | ICD-10-CM

## 2015-09-24 MED ORDER — ALPRAZOLAM 0.5 MG PO TABS: 0.5000 mg | ORAL_TABLET | Freq: Every evening | ORAL | Status: DC | PRN

## 2015-09-24 NOTE — Progress Notes (Signed)
Subjective:    Patient ID: Carla Morales, female    DOB: 1961/08/28, 54 y.o.   MRN: [id number]  HPI Pt here for follow up and management of chronic medical problems which includes hyperlipidemia. She is taking medications regularly. The patient has an area are on her back medial to the right scapula that itches periodically and is somewhat worrisome. 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 She takes meloxicam as needed for this. She also complains of fullness in the throat and occasionally has some reflux symptoms. She denies chest pain or shortness of breath. She denies any blood in the stool or black tarry bowel movements. She is passing her water without problems. She did have a colonoscopy about a year ago and this was negative in the Will be repeated in 10 years. She also saw the urologist for frequency but declined taking medicine because the side effects of constipation.     Patient Active Problem List   Diagnosis Date Noted  . Chest pressure 11/27/2013  . Bronchospasm 11/27/2013  . Allergy   . Hyperlipidemia   . Migraine   . Pinched nerve in neck    Outpatient Encounter Prescriptions as of 09/24/2015  Medication Sig  . albuterol (PROVENTIL HFA;VENTOLIN HFA) 108 (90 BASE) MCG/ACT inhaler Inhale 2 puffs into the lungs every 6 (six) hours as needed for wheezing or shortness of breath.  . beclomethasone (QVAR) 40 MCG/ACT inhaler Inhale 2 puffs into the lungs 2 (two) times daily before a meal.  . calcium carbonate (OSCAL) 1500 (600 CA) MG TABS tablet Take 600 mg of elemental calcium by mouth daily with breakfast.  . cyclobenzaprine (FLEXERIL) 10 MG tablet Take 1 tablet (10 mg total) by mouth at bedtime.  Marland Kitchen estradiol (VIVELLE-DOT) 0.075 MG/24HR Place 1 patch onto the skin 2 (two) times a week.  . fluticasone (FLONASE) 50 MCG/ACT nasal spray Place 2  sprays into both nostrils daily.  . Loratadine (CLARITIN PO) Take 1 tablet by mouth daily.   . meloxicam (MOBIC) 15 MG tablet Take 1 tablet (15 mg total) by mouth daily.  . Omega-3 Krill Oil 500 MG CAPS Take 1 capsule by mouth daily.  . Vitamin D, Ergocalciferol, (DRISDOL) 50000 UNITS CAPS capsule Take 1 capsule (50,000 Units total) by mouth every 7 (seven) days.  . [DISCONTINUED] Cholecalciferol (VITAMIN D-3) 1000 UNITS CAPS Take 1,000 Units by mouth daily.  . [DISCONTINUED] Multiple Vitamin (MULTIVITAMIN) tablet Take 1 tablet by mouth daily.  . [DISCONTINUED] azithromycin (ZITHROMAX Z-PAK) 250 MG tablet As directed   No facility-administered encounter medications on file as of 09/24/2015.      Review of Systems  Constitutional: Negative.   HENT: Negative.        Feels like theres something "stuck in throat"  Eyes: Negative.   Respiratory: Negative.   Cardiovascular: Negative.   Gastrointestinal: Negative.   Endocrine: Negative.   Genitourinary: Negative.   Musculoskeletal: Positive for neck pain.  Skin: Negative.        Lesion on back   Allergic/Immunologic: Negative.   Neurological: Negative.   Hematological: Negative.   Psychiatric/Behavioral: Negative.        Objective:   Physical Exam  Constitutional: She is oriented to person, place, and time. She appears well-developed and well-nourished. No distress.  HENT:  Head: Normocephalic and atraumatic.  Right Ear: External ear  normal.  Left Ear: External ear normal.  Nose: Nose normal.  Mouth/Throat: Oropharynx is clear and moist.  Eyes: Conjunctivae and EOM are normal. Pupils are equal, round, and reactive to light. Right eye exhibits no discharge. Left eye exhibits no discharge. No scleral icterus.  Neck: Normal range of motion. Neck supple. No thyromegaly present.  No carotid bruits thyromegaly or anterior cervical adenopathy  Cardiovascular: Normal rate, regular rhythm, normal heart sounds and intact distal pulses.     No murmur heard. At 72/m  Pulmonary/Chest: Effort normal and breath sounds normal. No respiratory distress. She has no wheezes. She has no rales. She exhibits no tenderness.  Clear anteriorly and posteriorly  Abdominal: Soft. Bowel sounds are normal. She exhibits no mass. There is tenderness. There is no rebound and no guarding.  Slight epigastric tenderness otherwise no liver or spleen enlargement and no bruits  Musculoskeletal: Normal range of motion. She exhibits no edema or tenderness.  Lymphadenopathy:    She has no cervical adenopathy.  Neurological: She is alert and oriented to person, place, and time. She has normal reflexes. No cranial nerve deficit.  Reflexes in both upper and lower extremities were equal and normal bilaterally  Skin: Skin is warm and dry. No rash noted.  Psychiatric: She has a normal mood and affect. Her behavior is normal. Judgment and thought content normal.  Nursing note and vitals reviewed.  BP 118/84 mmHg  Pulse 72  Temp(Src) 98.3 F (36.8 C) (Oral)  Ht $R'5\' 6"'bx$  (1.676 m)  Wt 180 lb (81.647 kg)  BMI 29.07 kg/m2  LMP 07/24/2010        Assessment & Plan:  1. Hyperlipidemia -Continue with aggressive therapeutic lifestyle changes which include diet and exercise. He results of lab work - BMP8+EGFR - CBC with Differential/Platelet - Hepatic function panel - NMR, lipoprofile  2. Postmenopausal -The patient sees her gynecologist regularly and gets her mammograms regularly - CBC with Differential/Platelet  3. Vitamin D deficiency -Continue current treatment pending results of lab work - Vit D  25 hydroxy (rtn osteoporosis monitoring)  4. Neck pain of over 3 months duration -The neck pain is bothersome. She had x-rays and apparently the radiologist read this as normal. 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 She continues to have problems with this. We discussed the possibility of getting an MRI and she will think about this if  she continues to have these problems.  5. Throat fullness -The patient will try some ranitidine 150 twice daily before breakfast and supper for one month if the throat fullness continues she may want to consider having an endoscopy.  Meds ordered this encounter  Medications  . calcium carbonate (OSCAL) 1500 (600 CA) MG TABS tablet    Sig: Take 600 mg of elemental calcium by mouth daily with breakfast.  . ALPRAZolam (XANAX) 0.5 MG tablet    Sig: Take 1 tablet (0.5 mg total) by mouth at bedtime as needed for anxiety.    Dispense:  30 tablet    Refill:  2   Patient Instructions  Continue current medications. Continue good therapeutic lifestyle changes which include good diet and exercise. Fall precautions discussed with patient. If an FOBT was given today- please return it to our front desk. If you are over 29 years old - you may need Prevnar 52 or the adult Pneumonia vaccine.  **Flu shots are available--- please call and schedule a FLU-CLINIC appointment**  After your visit with [place] today you will  receive a survey in the mail or online from [employer] regarding your care with [place]. Please take a moment to fill this out. Your feedback is very important to [place] as you can help [place] better understand your patient needs as well as improve your experience and satisfaction. WE CARE ABOUT YOU!!!   If the neck pain continues, please call us back and we will arrange to get additional C-spine films and possibly an MRI to further evaluate this, just let my nurse know Try taking Zantac 150 or generic ranitidine 150 twice daily for 4 weeks and see if this helps the fullness in your throat take this before breakfast and supper regularly for at least 1 month as a trial Remember to watch caffeine intake fried greasy foods and remember that meloxicam can irritate the stomach and cause increased problems with reflux Showed the back area that is pruritic to the dermatologist when you make a visit there Return the  FOBT   Arrie Senate MD

## 2015-09-24 NOTE — Patient Instructions (Addendum)
Continue current medications. Continue good therapeutic lifestyle changes which include good diet and exercise. Fall precautions discussed with patient. If an FOBT was given today- please return it to our front desk. If you are over 52 years old - you may need Prevnar 63 or the adult Pneumonia vaccine.  **Flu shots are available--- please call and schedule a FLU-CLINIC appointment**  After your visit with [place] today you will receive a survey in the mail or online from [employer] regarding your care with [place]. Please take a moment to fill this out. Your feedback is very important to [place] as you can help [place] better understand your patient needs as well as improve your experience and satisfaction. WE CARE ABOUT YOU!!!   If the neck pain continues, please call us back and we will arrange to get additional C-spine films and possibly an MRI to further evaluate this, just let my nurse know Try taking Zantac 150 or generic ranitidine 150 twice daily for 4 weeks and see if this helps the fullness in your throat take this before breakfast and supper regularly for at least 1 month as a trial Remember to watch caffeine intake fried greasy foods and remember that meloxicam can irritate the stomach and cause increased problems with reflux Showed the back area that is pruritic to the dermatologist when you make a visit there Return the FOBT

## 2015-09-26 LAB — CBC WITH DIFFERENTIAL/PLATELET
BASOS ABS: 0 10*3/uL (ref 0.0–0.2)
Basos: 0 %
EOS (ABSOLUTE): 0.2 10*3/uL (ref 0.0–0.4)
Eos: 3 %
HEMOGLOBIN: 13.5 g/dL (ref 11.1–15.9)
Hematocrit: 39.8 % (ref 34.0–46.6)
IMMATURE GRANS (ABS): 0 10*3/uL (ref 0.0–0.1)
IMMATURE GRANULOCYTES: 0 %
LYMPHS: 32 %
Lymphocytes Absolute: 2.6 10*3/uL (ref 0.7–3.1)
MCH: 31.1 pg (ref 26.6–33.0)
MCHC: 33.9 g/dL (ref 31.5–35.7)
MCV: 92 fL (ref 79–97)
MONOCYTES: 7 %
Monocytes Absolute: 0.6 10*3/uL (ref 0.1–0.9)
NEUTROS PCT: 58 %
Neutrophils Absolute: 4.5 10*3/uL (ref 1.4–7.0)
PLATELETS: 273 10*3/uL (ref 150–379)
RBC: 4.34 x10E6/uL (ref 3.77–5.28)
RDW: 13.2 % (ref 12.3–15.4)
WBC: 7.9 10*3/uL (ref 3.4–10.8)

## 2015-09-26 LAB — BMP8+EGFR
BUN/Creatinine Ratio: 21 (ref 9–23)
BUN: 14 mg/dL (ref 6–24)
CALCIUM: 9.8 mg/dL (ref 8.7–10.2)
CHLORIDE: 101 mmol/L (ref 97–106)
CO2: 23 mmol/L (ref 18–29)
Creatinine, Ser: 0.67 mg/dL (ref 0.57–1.00)
GFR calc Af Amer: 117 mL/min/{1.73_m2} (ref >59)
GFR calc non Af Amer: 101 mL/min/{1.73_m2} (ref >59)
Glucose: 93 mg/dL (ref 65–99)
POTASSIUM: 4.6 mmol/L (ref 3.5–5.2)
Sodium: 140 mmol/L (ref 136–144)

## 2015-09-26 LAB — NMR, LIPOPROFILE
Cholesterol: 195 mg/dL (ref 100–199)
HDL Cholesterol by NMR: 50 mg/dL (ref >39)
HDL Particle Number: 28.3 umol/L — ABNORMAL LOW (ref >=30.5)
LDL PARTICLE NUMBER: 1230 nmol/L — AB (ref <1000)
LDL SIZE: 21.4 nm (ref >20.5)
LDL-C: 113 mg/dL — ABNORMAL HIGH (ref 0–99)
LP-IR Score: 30 (ref <=45)
Small LDL Particle Number: 318 nmol/L (ref <=527)
TRIGLYCERIDES BY NMR: 158 mg/dL — AB (ref 0–149)

## 2015-09-26 LAB — HEPATIC FUNCTION PANEL
ALBUMIN: 4.2 g/dL (ref 3.5–5.5)
ALK PHOS: 85 IU/L (ref 39–117)
ALT: 11 IU/L (ref 0–32)
AST: 14 IU/L (ref 0–40)
BILIRUBIN TOTAL: 0.3 mg/dL (ref 0.0–1.2)
BILIRUBIN, DIRECT: 0.08 mg/dL (ref 0.00–0.40)
TOTAL PROTEIN: 7.1 g/dL (ref 6.0–8.5)

## 2015-09-26 LAB — VITAMIN D 25 HYDROXY (VIT D DEFICIENCY, FRACTURES): Vit D, 25-Hydroxy: 54.5 ng/mL (ref 30.0–100.0)

## 2015-10-01 ENCOUNTER — Other Ambulatory Visit: Payer: Self-pay | Admitting: Family Medicine

## 2015-10-04 NOTE — Telephone Encounter (Signed)
Last seen and last Vit D 09/24/15  DWM   54.5

## 2015-12-01 ENCOUNTER — Telehealth: Payer: Self-pay | Admitting: *Deleted

## 2015-12-01 MED ORDER — AMOXICILLIN-POT CLAVULANATE 875-125 MG PO TABS: 1.0000 | ORAL_TABLET | Freq: Two times a day (BID) | ORAL | Status: DC

## 2015-12-01 NOTE — Telephone Encounter (Signed)
Has a sinus infection and is unable to come in due to some family issues. Would you call in antibiotic to [facility]?

## 2015-12-01 NOTE — Telephone Encounter (Signed)
Pt called and aware

## 2015-12-01 NOTE — Telephone Encounter (Signed)
Please call in a prescription for Augmentin 875 #20 one twice daily with food and have the patient use nasal saline frequently in each nostril, keep the house as cool as possible, and use a cool mist humidifier

## 2015-12-02 ENCOUNTER — Other Ambulatory Visit: Payer: Self-pay | Admitting: *Deleted

## 2015-12-02 DIAGNOSIS — R0981 Nasal congestion: Secondary | ICD-10-CM

## 2015-12-02 DIAGNOSIS — G589 Mononeuropathy, unspecified: Secondary | ICD-10-CM

## 2015-12-02 DIAGNOSIS — S149XXA Injury of unspecified nerves of neck, initial encounter: Secondary | ICD-10-CM

## 2015-12-02 MED ORDER — FLUTICASONE PROPIONATE 50 MCG/ACT NA SUSP: 2.0000 | Freq: Every day | NASAL | Status: DC

## 2015-12-02 MED ORDER — MELOXICAM 15 MG PO TABS: 15.0000 mg | ORAL_TABLET | Freq: Every day | ORAL | Status: DC

## 2015-12-02 MED ORDER — VITAMIN D (ERGOCALCIFEROL) 1.25 MG (50000 UNIT) PO CAPS: ORAL_CAPSULE | ORAL | Status: DC

## 2015-12-20 ENCOUNTER — Other Ambulatory Visit: Payer: Self-pay | Admitting: Gynecology

## 2015-12-20 ENCOUNTER — Other Ambulatory Visit: Payer: Self-pay

## 2015-12-20 NOTE — Telephone Encounter (Signed)
Last seen 09/24/15 DWM  Requesting for mail order  If approve print for patient

## 2015-12-21 MED ORDER — ALPRAZOLAM 0.5 MG PO TABS: 0.5000 mg | ORAL_TABLET | Freq: Every evening | ORAL | Status: DC | PRN

## 2015-12-22 ENCOUNTER — Other Ambulatory Visit: Payer: Self-pay | Admitting: Family Medicine

## 2016-01-14 ENCOUNTER — Other Ambulatory Visit: Payer: Self-pay

## 2016-01-14 DIAGNOSIS — Z1231 Encounter for screening mammogram for malignant neoplasm of breast: Secondary | ICD-10-CM

## 2016-01-25 ENCOUNTER — Ambulatory Visit (INDEPENDENT_AMBULATORY_CARE_PROVIDER_SITE_OTHER): Payer: BLUE CROSS/BLUE SHIELD | Admitting: Gynecology

## 2016-01-25 ENCOUNTER — Encounter: Payer: Self-pay | Admitting: Gynecology

## 2016-01-25 VITALS — BP 124/78 | Ht 66.0 in | Wt 174.0 lb

## 2016-01-25 DIAGNOSIS — Z79890 Hormone replacement therapy (postmenopausal): Secondary | ICD-10-CM

## 2016-01-25 DIAGNOSIS — Z01419 Encounter for gynecological examination (general) (routine) without abnormal findings: Secondary | ICD-10-CM | POA: Diagnosis not present

## 2016-01-25 MED ORDER — ESTRADIOL 0.075 MG/24HR TD PTTW: 1.0000 | MEDICATED_PATCH | TRANSDERMAL | Status: DC

## 2016-01-25 NOTE — Patient Instructions (Signed)

## 2016-01-25 NOTE — Progress Notes (Signed)
    Carla Morales [birth year] [id number]        55 y.o.  G1P1  for annual exam.  Doing well.  Several issues noted below.  Past medical history,surgical history, problem list, medications, allergies, family history and social history were all reviewed and documented as reviewed in the EPIC chart.  ROS:  Performed with pertinent positives and negatives included in the history, assessment and plan.   Additional significant findings :  none   Exam: Carla Morales assistant Filed Vitals:   01/25/16 1517  BP: 124/78  Height: 5\' 6"  (1.676 m)  Weight: 174 lb (78.926 kg)   General appearance:  Normal affect, orientation and appearance. Skin: Grossly normal HEENT: Without gross lesions.  No cervical or supraclavicular adenopathy. Thyroid normal.  Lungs:  Clear without wheezing, rales or rhonchi Cardiac: RR, without RMG Abdominal:  Soft, nontender, without masses, guarding, rebound, organomegaly or hernia Breasts:  Examined lying and sitting without masses, retractions, discharge or axillary adenopathy. Pelvic:  Ext/BUS/vagina with mild atrophic changes  Adnexa without masses or tenderness    Anus and perineum normal   Rectovaginal normal sphincter tone without palpated masses or tenderness.    Assessment/Plan:  55 y.o. G1P1 female for annual exam.   1. Postmenopausal/HRT. Patient started on Vivelle 0.075 mg patches last year due to menopausal symptoms. Status post [facility] 2011 for menorrhagia, leiomyoma and prolapse.  Has had great relief of her hot flushes and night sweats. Again reviewed all issue of ERT and risks to include increased risk of stroke heart attack DVT and possible breast cancer. At this point the patient wants to continue and refill 1 year provided. 2. Mammography scheduled this week. Maternal history of breast cancer. Mother also has a history of uterine cancer and lung cancer. I asked the patient as to whether her mother was ever tested genetically. She does not believe so.  Of asked the patient to discuss this with her mother and for her mother to follow up with one of her oncologist to see if they would want her to do a genetic screening for possible genetic marker that be passed onto the patient and her daughter. Patient agrees to do so. She'll continue with annual mammographies. SBE monthly reviewed. 3. DEXA 2016 normal. Recommend repeat at 5 year interval. 4. Colonoscopy 2015. Repeat at their recommended interval. 5. Pap smear 10/2014. No Pap smear done today. No history of significant abnormal Pap smears. 6. Health maintenance. No routine lab work done as patient reports this done elsewhere. Follow up one year, sooner as needed.   Carla Auerbach MD, 3:42 PM 01/25/2016

## 2016-01-28 ENCOUNTER — Ambulatory Visit
Admission: RE | Admit: 2016-01-28 | Discharge: 2016-01-28 | Disposition: A | Discharge status: Home / Self Care | Payer: BLUE CROSS/BLUE SHIELD | Source: Ambulatory Visit

## 2016-01-28 DIAGNOSIS — Z1231 Encounter for screening mammogram for malignant neoplasm of breast: Secondary | ICD-10-CM

## 2016-03-16 ENCOUNTER — Other Ambulatory Visit: Payer: Self-pay | Admitting: *Deleted

## 2016-03-16 DIAGNOSIS — E559 Vitamin D deficiency, unspecified: Secondary | ICD-10-CM

## 2016-03-16 DIAGNOSIS — Z780 Asymptomatic menopausal state: Secondary | ICD-10-CM

## 2016-03-16 DIAGNOSIS — E785 Hyperlipidemia, unspecified: Secondary | ICD-10-CM

## 2016-03-16 DIAGNOSIS — R5383 Other fatigue: Secondary | ICD-10-CM

## 2016-03-17 ENCOUNTER — Other Ambulatory Visit: Payer: BLUE CROSS/BLUE SHIELD

## 2016-03-17 DIAGNOSIS — R5383 Other fatigue: Secondary | ICD-10-CM | POA: Diagnosis not present

## 2016-03-17 DIAGNOSIS — E559 Vitamin D deficiency, unspecified: Secondary | ICD-10-CM | POA: Diagnosis not present

## 2016-03-17 DIAGNOSIS — E785 Hyperlipidemia, unspecified: Secondary | ICD-10-CM

## 2016-03-18 LAB — ANEMIA PROFILE B
BASOS: 1 %
Basophils Absolute: 0 10*3/uL (ref 0.0–0.2)
EOS (ABSOLUTE): 0.2 10*3/uL (ref 0.0–0.4)
EOS: 3 %
FERRITIN: 83 ng/mL (ref 15–150)
FOLATE: 11.4 ng/mL (ref >3.0)
HEMATOCRIT: 40.7 % (ref 34.0–46.6)
HEMOGLOBIN: 13.7 g/dL (ref 11.1–15.9)
IMMATURE GRANS (ABS): 0 10*3/uL (ref 0.0–0.1)
IMMATURE GRANULOCYTES: 0 %
IRON SATURATION: 29 % (ref 15–55)
IRON: 85 ug/dL (ref 27–159)
LYMPHS: 30 %
Lymphocytes Absolute: 2 10*3/uL (ref 0.7–3.1)
MCH: 30 pg (ref 26.6–33.0)
MCHC: 33.7 g/dL (ref 31.5–35.7)
MCV: 89 fL (ref 79–97)
Monocytes Absolute: 0.4 10*3/uL (ref 0.1–0.9)
Monocytes: 5 %
NEUTROS PCT: 61 %
Neutrophils Absolute: 4 10*3/uL (ref 1.4–7.0)
Platelets: 286 10*3/uL (ref 150–379)
RBC: 4.57 x10E6/uL (ref 3.77–5.28)
RDW: 13.8 % (ref 12.3–15.4)
Retic Ct Pct: 0.9 % (ref 0.6–2.6)
TIBC: 294 ug/dL (ref 250–450)
UIBC: 209 ug/dL (ref 131–425)
Vitamin B-12: 227 pg/mL (ref 211–946)
WBC: 6.5 10*3/uL (ref 3.4–10.8)

## 2016-03-18 LAB — BMP8+EGFR
BUN / CREAT RATIO: 17 (ref 9–23)
BUN: 13 mg/dL (ref 6–24)
CHLORIDE: 100 mmol/L (ref 96–106)
CO2: 22 mmol/L (ref 18–29)
Calcium: 9.9 mg/dL (ref 8.7–10.2)
Creatinine, Ser: 0.75 mg/dL (ref 0.57–1.00)
GFR calc Af Amer: 106 mL/min/{1.73_m2} (ref >59)
GFR calc non Af Amer: 92 mL/min/{1.73_m2} (ref >59)
Glucose: 92 mg/dL (ref 65–99)
POTASSIUM: 4.2 mmol/L (ref 3.5–5.2)
Sodium: 141 mmol/L (ref 134–144)

## 2016-03-18 LAB — NMR, LIPOPROFILE
Cholesterol: 225 mg/dL — ABNORMAL HIGH (ref 100–199)
HDL CHOLESTEROL BY NMR: 54 mg/dL (ref >39)
HDL Particle Number: 27.6 umol/L — ABNORMAL LOW (ref >=30.5)
LDL PARTICLE NUMBER: 1632 nmol/L — AB (ref <1000)
LDL SIZE: 21.2 nm (ref >20.5)
LDL-C: 129 mg/dL — AB (ref 0–99)
LP-IR Score: 50 — ABNORMAL HIGH (ref <=45)
Small LDL Particle Number: 397 nmol/L (ref <=527)
TRIGLYCERIDES BY NMR: 211 mg/dL — AB (ref 0–149)

## 2016-03-18 LAB — HEPATIC FUNCTION PANEL
ALT: 13 IU/L (ref 0–32)
AST: 11 IU/L (ref 0–40)
Albumin: 4.3 g/dL (ref 3.5–5.5)
Alkaline Phosphatase: 91 IU/L (ref 39–117)
BILIRUBIN, DIRECT: 0.1 mg/dL (ref 0.00–0.40)
Bilirubin Total: 0.5 mg/dL (ref 0.0–1.2)
TOTAL PROTEIN: 7.4 g/dL (ref 6.0–8.5)

## 2016-03-18 LAB — THYROID PANEL WITH TSH
FREE THYROXINE INDEX: 2.5 (ref 1.2–4.9)
T3 UPTAKE RATIO: 28 % (ref 24–39)
T4, Total: 8.8 ug/dL (ref 4.5–12.0)
TSH: 1.87 u[IU]/mL (ref 0.450–4.500)

## 2016-03-18 LAB — VITAMIN D 25 HYDROXY (VIT D DEFICIENCY, FRACTURES): VIT D 25 HYDROXY: 47.4 ng/mL (ref 30.0–100.0)

## 2016-03-30 ENCOUNTER — Encounter: Payer: Self-pay | Admitting: Family Medicine

## 2016-03-30 ENCOUNTER — Ambulatory Visit (INDEPENDENT_AMBULATORY_CARE_PROVIDER_SITE_OTHER): Payer: BLUE CROSS/BLUE SHIELD | Admitting: Family Medicine

## 2016-03-30 VITALS — BP 109/56 | HR 65 | Temp 97.8°F | Ht 66.0 in | Wt 173.0 lb

## 2016-03-30 DIAGNOSIS — T7840XS Allergy, unspecified, sequela: Secondary | ICD-10-CM

## 2016-03-30 DIAGNOSIS — E785 Hyperlipidemia, unspecified: Secondary | ICD-10-CM | POA: Diagnosis not present

## 2016-03-30 DIAGNOSIS — E559 Vitamin D deficiency, unspecified: Secondary | ICD-10-CM | POA: Diagnosis not present

## 2016-03-30 DIAGNOSIS — Z1211 Encounter for screening for malignant neoplasm of colon: Secondary | ICD-10-CM

## 2016-03-30 DIAGNOSIS — Z780 Asymptomatic menopausal state: Secondary | ICD-10-CM | POA: Diagnosis not present

## 2016-03-30 DIAGNOSIS — H8309 Labyrinthitis, unspecified ear: Secondary | ICD-10-CM

## 2016-03-30 MED ORDER — MECLIZINE HCL 12.5 MG PO TABS: 12.5000 mg | ORAL_TABLET | Freq: Three times a day (TID) | ORAL | Status: DC | PRN

## 2016-03-30 MED ORDER — VITAMIN D (ERGOCALCIFEROL) 1.25 MG (50000 UNIT) PO CAPS
ORAL_CAPSULE | ORAL | Status: DC
Start: 2016-03-30 — End: 2016-10-03

## 2016-03-30 NOTE — Patient Instructions (Addendum)
Continue current medications. Continue good therapeutic lifestyle changes which include good diet and exercise. Fall precautions discussed with patient. If an FOBT was given today- please return it to our front desk. If you are over 53 years old - you may need Prevnar 38 or the adult Pneumonia vaccine.   After your visit with [place] today you will receive a survey in the mail or online from [employer] regarding your care with [place]. Please take a moment to fill this out. Your feedback is very important to [place] as you can help [place] better understand your patient needs as well as improve your experience and satisfaction. WE CARE ABOUT YOU!!!   Patient should discuss with her mother's oncologist the relevance of getting genetic testing because of her mother's cancers. If the dizziness persists she should take meclizine 12.5 mg #30 one 3 times a day with meals for 1 week and then as needed after that Take Zantac or ranitidine 150 mg more regularly for at least one to 2 months--- make sure you take it before meals Also, and take as little meloxicam as necessary is this can irritate the stomach more and avoid caffeine and fried foods Try to be more aggressive with therapeutic lifestyle changes to help bring the cholesterol numbers back down and continue with current treatment. We will recheck the cholesterol at the next visit hopefully these numbers will be better. Stay active physically and drink plenty of fluids

## 2016-03-30 NOTE — Progress Notes (Signed)
Subjective:    Patient ID: Carla Morales, female    DOB: 1961-09-01, 55 y.o.   MRN: [id number]  HPI Pt here for follow up and management of chronic medical problems which includes hyperlipidemia. She is taking medications regularly.The patient complains of some left sided neck and shoulder pain. She also complains of being "off-balance" at times. She is due to return an FOBT and she has had lab work done which we will review with her today. The patient has had recent lab work and this was all reviewed with her today and she had no questions regarding this. She did indicate that she's been so busy helping take care of her mother that she has not been watching her diet as closely as possible. Her mother has a history of uterine cancer breast cancer and lung cancer and all these cancers are independent of each other. She plans to discuss with her mother's oncologist and with her gynecologist the need for possible genetic testing. The patient has been extremely stressed worrying about her mother. She denies any chest pain or shortness of breath. She does have some heartburn and indigestion and attributes this to poor diet habits and increased caffeine intake. She takes Zantac only as needed but will take it more regularly and will try to reduce the amount of meloxicam she is taking also. She is not seeing any blood in the stool or had any black tarry bowel movements. The nature of her stools has not changed any. She's not had any abdominal pain. She is passing her water without problems. She denies any headaches. She has had this dizziness which is positional in nature. Her respiratory condition is stable and she has not had use her inhalers despite all the allergens in the environment.     Patient Active Problem List   Diagnosis Date Noted  . Chest pressure 11/27/2013  . Bronchospasm 11/27/2013  . Allergy   . Hyperlipidemia   . Migraine   . Pinched nerve in neck    Outpatient Encounter  Prescriptions as of 03/30/2016  Medication Sig  . ALPRAZolam (XANAX) 0.5 MG tablet Take 1 tablet (0.5 mg total) by mouth at bedtime as needed for anxiety.  . calcium carbonate (OSCAL) 1500 (600 CA) MG TABS tablet Take 600 mg of elemental calcium by mouth daily with breakfast.  . cyclobenzaprine (FLEXERIL) 10 MG tablet Take 1 tablet (10 mg total) by mouth at bedtime.  Marland Kitchen estradiol (VIVELLE-DOT) 0.075 MG/24HR Place 1 patch onto the skin 2 (two) times a week.  . fluticasone (FLONASE) 50 MCG/ACT nasal spray Place 2 sprays into both nostrils daily.  . Loratadine (CLARITIN PO) Take 1 tablet by mouth daily.   . meloxicam (MOBIC) 15 MG tablet Take 1 tablet (15 mg total) by mouth daily.  . Omega-3 Krill Oil 500 MG CAPS Take 1 capsule by mouth daily.  Marland Kitchen PROVENTIL HFA 108 (90 Base) MCG/ACT inhaler INHALE 2 PUFFS INTO THE LUNGS EVERY 6 (SIX) HOURS AS NEEDED FOR WHEEZING OR SHORTNESS OF BREATH.  Marland Kitchen QVAR 40 MCG/ACT inhaler INHALE 2 PUFFS INTO THE LUNGS 2 (TWO) TIMES DAILY BEFORE A MEAL.  Marland Kitchen Vitamin D, Ergocalciferol, (DRISDOL) 50000 units CAPS capsule TAKE 1 CAPSULE (50,000 UNITS TOTAL) BY MOUTH EVERY 7 (SEVEN) DAYS.  . [DISCONTINUED] Vitamin D, Ergocalciferol, (DRISDOL) 50000 units CAPS capsule TAKE 1 CAPSULE (50,000 UNITS TOTAL) BY MOUTH EVERY 7 (SEVEN) DAYS.   No facility-administered encounter medications on file as of 03/30/2016.      Review of  Systems  Constitutional: Negative.   HENT: Negative.   Eyes: Negative.   Respiratory: Negative.   Cardiovascular: Negative.   Gastrointestinal: Negative.   Endocrine: Negative.   Genitourinary: Negative.   Musculoskeletal: Positive for neck pain (left neck and shoulder ).  Skin: Negative.   Allergic/Immunologic: Negative.   Neurological: Negative.        "Off-balance"  Hematological: Negative.   Psychiatric/Behavioral: Negative.        Objective:   Physical Exam  Constitutional: She is oriented to person, place, and time. She appears well-developed  and well-nourished. No distress.  HENT:  Head: Normocephalic and atraumatic.  Right Ear: External ear normal.  Left Ear: External ear normal.  Nose: Nose normal.  Mouth/Throat: Oropharynx is clear and moist. No oropharyngeal exudate.  Eyes: Conjunctivae and EOM are normal. Pupils are equal, round, and reactive to light. Right eye exhibits no discharge. Left eye exhibits no discharge. No scleral icterus.  Neck: Normal range of motion. Neck supple. No thyromegaly present.  No thyromegaly or anterior cervical adenopathy or bruits  Cardiovascular: Normal rate, regular rhythm, normal heart sounds and intact distal pulses.   No murmur heard. Heart has a regular rate and rhythm at 72/m  Pulmonary/Chest: Effort normal and breath sounds normal. No respiratory distress. She has no wheezes. She has no rales. She exhibits no tenderness.  Clear anteriorly and posteriorly without wheezes or rhonchi  Abdominal: Soft. Bowel sounds are normal. She exhibits no mass. There is no tenderness. There is no rebound and no guarding.  No epigastric tenderness no liver or spleen enlargement and no inguinal adenopathy.  Musculoskeletal: Normal range of motion. She exhibits no edema or tenderness.  Good range of motion of neck.  Lymphadenopathy:    She has no cervical adenopathy.  Neurological: She is alert and oriented to person, place, and time. She has normal reflexes. No cranial nerve deficit.  Skin: Skin is warm and dry. No rash noted.  Psychiatric: She has a normal mood and affect. Her behavior is normal. Judgment and thought content normal.  Nursing note and vitals reviewed.  BP 109/56 mmHg  Pulse 65  Temp(Src) 97.8 F (36.6 C) (Oral)  Ht 5\' 6"  (1.676 m)  Wt 173 lb (78.472 kg)  BMI 27.94 kg/m2  LMP 07/24/2010        Assessment & Plan:  1. Hyperlipidemia -The patient will continue with her current treatment and hopefully her home circumstances will allow her to focus more on her diet and exercise  regimen as her mother improved from her cancer treatment.  2. Vitamin D deficiency -Continue with current treatment  3. Postmenopausal -Continue regular follow-ups with GYN and discuss with him genetic testing because of the positive family history her mother is had with uterine cancer and breast cancer and lung cancer  4. Special screening for malignant neoplasms, colon - Fecal occult blood, imunochemical; Future  5. Allergy, sequela -Continue with inhalers and allergy medicines as doing  6. Labyrinthitis, unspecified laterality -Try Antivert for 1 week with food and call back if no improvement  Meds ordered this encounter  Medications  . Vitamin D, Ergocalciferol, (DRISDOL) 50000 units CAPS capsule    Sig: TAKE 1 CAPSULE (50,000 UNITS TOTAL) BY MOUTH EVERY 7 (SEVEN) DAYS.    Dispense:  12 capsule    Refill:  1  . meclizine (ANTIVERT) 12.5 MG tablet    Sig: Take 1 tablet (12.5 mg total) by mouth 3 (three) times daily as needed for dizziness.  Dispense:  60 tablet    Refill:  1   Patient Instructions  Continue current medications. Continue good therapeutic lifestyle changes which include good diet and exercise. Fall precautions discussed with patient. If an FOBT was given today- please return it to our front desk. If you are over 6 years old - you may need Prevnar 36 or the adult Pneumonia vaccine.   After your visit with [place] today you will receive a survey in the mail or online from [employer] regarding your care with [place]. Please take a moment to fill this out. Your feedback is very important to [place] as you can help [place] better understand your patient needs as well as improve your experience and satisfaction. WE CARE ABOUT YOU!!!   Patient should discuss with her mother's oncologist the relevance of getting genetic testing because of her mother's cancers. If the dizziness persists she should take meclizine 12.5 mg #30 one 3 times a day with meals for 1 week and then as needed  after that Take Zantac or ranitidine 150 mg more regularly for at least one to 2 months--- make sure you take it before meals Also, and take as little meloxicam as necessary is this can irritate the stomach more and avoid caffeine and fried foods Try to be more aggressive with therapeutic lifestyle changes to help bring the cholesterol numbers back down and continue with current treatment. We will recheck the cholesterol at the next visit hopefully these numbers will be better. Stay active physically and drink plenty of fluids    Arrie Senate MD

## 2016-04-07 ENCOUNTER — Telehealth: Payer: Self-pay | Admitting: Family Medicine

## 2016-04-07 NOTE — Telephone Encounter (Signed)
Pt called and aware that we will fill out form for Insurance - labs, etc..  DWM will sign on Wednesday and we will let pt know when ready

## 2016-05-30 ENCOUNTER — Other Ambulatory Visit: Payer: BLUE CROSS/BLUE SHIELD

## 2016-05-30 ENCOUNTER — Other Ambulatory Visit: Payer: Self-pay | Admitting: *Deleted

## 2016-05-30 ENCOUNTER — Telehealth: Payer: Self-pay | Admitting: Family Medicine

## 2016-05-30 DIAGNOSIS — Z20820 Contact with and (suspected) exposure to varicella: Secondary | ICD-10-CM

## 2016-05-30 NOTE — Telephone Encounter (Signed)
Patient aware to have varicella titers drawn and states she will come in today

## 2016-05-30 NOTE — Telephone Encounter (Signed)
Lm to call me-jhb

## 2016-05-30 NOTE — Telephone Encounter (Signed)
Please call patient and tell her that we can do varicella titer and then go from there

## 2016-05-31 LAB — VARICELLA ZOSTER ANTIBODY, IGG: VARICELLA: 1180 {index} (ref >165)

## 2016-05-31 NOTE — Telephone Encounter (Signed)
Pt came in for titers - CB took care of this

## 2016-06-06 ENCOUNTER — Telehealth: Payer: Self-pay | Admitting: Family Medicine

## 2016-06-08 NOTE — Telephone Encounter (Signed)
Pt aware of all labs 

## 2016-06-09 ENCOUNTER — Other Ambulatory Visit: Payer: Self-pay | Admitting: Family Medicine

## 2016-06-09 DIAGNOSIS — R0981 Nasal congestion: Secondary | ICD-10-CM

## 2016-10-03 ENCOUNTER — Encounter: Payer: Self-pay | Admitting: Family Medicine

## 2016-10-03 ENCOUNTER — Ambulatory Visit (INDEPENDENT_AMBULATORY_CARE_PROVIDER_SITE_OTHER): Payer: BLUE CROSS/BLUE SHIELD | Admitting: Family Medicine

## 2016-10-03 ENCOUNTER — Ambulatory Visit (INDEPENDENT_AMBULATORY_CARE_PROVIDER_SITE_OTHER): Payer: BLUE CROSS/BLUE SHIELD

## 2016-10-03 VITALS — BP 127/66 | HR 73 | Temp 97.5°F | Ht 66.0 in | Wt 178.0 lb

## 2016-10-03 DIAGNOSIS — E559 Vitamin D deficiency, unspecified: Secondary | ICD-10-CM

## 2016-10-03 DIAGNOSIS — J301 Allergic rhinitis due to pollen: Secondary | ICD-10-CM | POA: Diagnosis not present

## 2016-10-03 DIAGNOSIS — E7800 Pure hypercholesterolemia, unspecified: Secondary | ICD-10-CM

## 2016-10-03 DIAGNOSIS — G589 Mononeuropathy, unspecified: Secondary | ICD-10-CM

## 2016-10-03 DIAGNOSIS — G588 Other specified mononeuropathies: Secondary | ICD-10-CM

## 2016-10-03 DIAGNOSIS — R29898 Other symptoms and signs involving the musculoskeletal system: Secondary | ICD-10-CM | POA: Diagnosis not present

## 2016-10-03 DIAGNOSIS — S149XXA Injury of unspecified nerves of neck, initial encounter: Secondary | ICD-10-CM

## 2016-10-03 DIAGNOSIS — Z23 Encounter for immunization: Secondary | ICD-10-CM | POA: Diagnosis not present

## 2016-10-03 DIAGNOSIS — G629 Polyneuropathy, unspecified: Secondary | ICD-10-CM

## 2016-10-03 MED ORDER — VITAMIN D (ERGOCALCIFEROL) 1.25 MG (50000 UNIT) PO CAPS: ORAL_CAPSULE | ORAL | 1 refills | Status: DC

## 2016-10-03 MED ORDER — MELOXICAM 15 MG PO TABS: 15.0000 mg | ORAL_TABLET | Freq: Every day | ORAL | 0 refills | Status: DC

## 2016-10-03 NOTE — Progress Notes (Signed)
Subjective:    Patient ID: Carla Morales, female    DOB: [birth year], 55 y.o.   MRN: [id number]  HPI Pt here for follow up and management of chronic medical problems which includes hyperlipidemia. She is taking medications regularly.The patient is doing well today and will get her lab work done because of her hyperlipidemia. She is also due to get her flu shot. She complains of some pain and weakness in the left arm and she has had previous neck feels but this was years ago. She is requesting refills on meloxicam and vitamin D. The pain and weakness in the left arm seems to been worse the past 3-4 months. She attributes this to his swimming in the pool with her granddaughter. She has no history of any injuries in the past to her neck. She also has trouble turning her head from side to side and also indicates sometimes when she gets her hair 6 and arches her neck back to have her hair washed that this can bother her neck also. She denies any chest pain or shortness of breath. She does have occasional problems with swallowing and this does not seem to be more frequent than usual. This can be with either solids or liquids. She denies any heartburn nausea vomiting diarrhea or blood in the stool. She is up-to-date on her colonoscopies and the next one is not due until 2025. She has no trouble passing her water and no burning pain or frequency.     Patient Active Problem List   Diagnosis Date Noted  . Chest pressure 11/27/2013  . Bronchospasm 11/27/2013  . Allergy   . Hyperlipidemia   . Migraine   . Pinched nerve in neck    Outpatient Encounter Prescriptions as of 10/03/2016  Medication Sig  . ALPRAZolam (XANAX) 0.5 MG tablet Take 1 tablet (0.5 mg total) by mouth at bedtime as needed for anxiety.  . calcium carbonate (OSCAL) 1500 (600 CA) MG TABS tablet Take 600 mg of elemental calcium by mouth daily with breakfast.  . cyclobenzaprine (FLEXERIL) 10 MG tablet Take 1 tablet (10 mg total) by mouth  at bedtime.  Marland Kitchen estradiol (VIVELLE-DOT) 0.075 MG/24HR Place 1 patch onto the skin 2 (two) times a week.  . fluticasone (FLONASE) 50 MCG/ACT nasal spray USE 2 SPRAYS INTO BOTH NOST RILS DAILY  . Loratadine (CLARITIN PO) Take 1 tablet by mouth daily.   . meclizine (ANTIVERT) 12.5 MG tablet Take 1 tablet (12.5 mg total) by mouth 3 (three) times daily as needed for dizziness.  . meloxicam (MOBIC) 15 MG tablet Take 1 tablet (15 mg total) by mouth daily.  . Omega-3 Krill Oil 500 MG CAPS Take 1 capsule by mouth daily.  Marland Kitchen PROVENTIL HFA 108 (90 Base) MCG/ACT inhaler INHALE 2 PUFFS INTO THE LUNGS EVERY 6 (SIX) HOURS AS NEEDED FOR WHEEZING OR SHORTNESS OF BREATH.  Marland Kitchen QVAR 40 MCG/ACT inhaler INHALE 2 PUFFS INTO THE LUNGS 2 (TWO) TIMES DAILY BEFORE A MEAL.  Marland Kitchen Vitamin D, Ergocalciferol, (DRISDOL) 50000 units CAPS capsule TAKE 1 CAPSULE (50,000 UNITS TOTAL) BY MOUTH EVERY 7 (SEVEN) DAYS.   No facility-administered encounter medications on file as of 10/03/2016.       Review of Systems  Constitutional: Negative.   HENT: Negative.   Eyes: Negative.   Respiratory: Negative.   Cardiovascular: Negative.   Gastrointestinal: Negative.   Endocrine: Negative.   Genitourinary: Negative.   Musculoskeletal: Positive for myalgias (left upper arm / shoulder weakness and pain).  Skin:  Negative.   Allergic/Immunologic: Negative.   Neurological: Negative.   Hematological: Negative.   Psychiatric/Behavioral: Negative.        Objective:   Physical Exam  Constitutional: She is oriented to person, place, and time. She appears well-developed and well-nourished. No distress.  HENT:  Head: Normocephalic and atraumatic.  Right Ear: External ear normal.  Left Ear: External ear normal.  Mouth/Throat: Oropharynx is clear and moist. No oropharyngeal exudate.  Slight nasal congestion right greater than left  Eyes: Conjunctivae and EOM are normal. Pupils are equal, round, and reactive to light. Right eye exhibits no  discharge. Left eye exhibits no discharge. No scleral icterus.  Neck: Normal range of motion. Neck supple. No thyromegaly present.  No thyromegaly anterior cervical adenopathy or bruits  Cardiovascular: Normal rate, regular rhythm, normal heart sounds and intact distal pulses.   No murmur heard. Heart is regular at 60/m  Pulmonary/Chest: Effort normal and breath sounds normal. No respiratory distress. She has no wheezes. She has no rales. She exhibits no tenderness.  Clear anteriorly and posteriorly  Abdominal: Soft. Bowel sounds are normal. She exhibits no mass. There is no tenderness. There is no rebound and no guarding.  No abdominal tenderness or organ enlargement bruits or masses or inguinal adenopathy  Musculoskeletal: She exhibits no edema.  Limited range of motion of neck turning from side to side  Lymphadenopathy:    She has no cervical adenopathy.  Neurological: She is alert and oriented to person, place, and time. She has normal reflexes. No cranial nerve deficit.  Reflexes were equal bilaterally in both upper and lower extremities  Skin: Skin is warm and dry. No rash noted.  Psychiatric: She has a normal mood and affect. Her behavior is normal. Judgment and thought content normal.  Nursing note and vitals reviewed.  BP 127/66 (BP Location: Right Arm)   Pulse 73   Temp 97.5 F (36.4 C) (Oral)   Ht _0  (1.676 m)   Wt 178 lb (80.7 kg)   LMP 07/24/2010   BMI 28.73 kg/m         Assessment & Plan:  1. Pure hypercholesterolemia -Appointment with clinical pharmacy for more education regarding hyperlipidemia and management - BMP8+EGFR - CBC with Differential/Platelet - Hepatic function panel - NMR, lipoprofile  2. Vitamin D deficiency -Continue current treatment pending results of lab work - CBC with Differential/Platelet - VITAMIN D 25 Hydroxy (Vit-D Deficiency, Fractures)  3. Pinched nerve in neck -C-spine films with possible need for MRI - meloxicam (MOBIC) 15  MG tablet; Take 1 tablet (15 mg total) by mouth daily.  Dispense: 90 tablet; Refill: 0 - DG Cervical Spine Complete; Future  4. Left arm weakness - DG Cervical Spine Complete; Future  5. Acute allergic rhinitis due to pollen, unspecified seasonality -Continue with Claritin and Flonase and nasal saline  6. Neuropathy ([place]) -C-spine films and possibly need for MRI  Meds ordered this encounter  Medications  . Vitamin D, Ergocalciferol, (DRISDOL) 50000 units CAPS capsule    Sig: TAKE 1 CAPSULE (50,000 UNITS TOTAL) BY MOUTH EVERY 7 (SEVEN) DAYS.    Dispense:  12 capsule    Refill:  1  . meloxicam (MOBIC) 15 MG tablet    Sig: Take 1 tablet (15 mg total) by mouth daily.    Dispense:  90 tablet    Refill:  0   Patient Instructions  Continue current medications. Continue good therapeutic lifestyle changes which include good diet and exercise. Fall precautions discussed with patient.  If an FOBT was given today- please return it to our front desk. If you are over 61 years old - you may need Prevnar 35 or the adult Pneumonia vaccine.  **Flu shots are available--- please call and schedule a FLU-CLINIC appointment**  After your visit with [place] today you will receive a survey in the mail or online from [employer] regarding your care with [place]. Please take a moment to fill this out. Your feedback is very important to [place] as you can help [place] better understand your patient needs as well as improve your experience and satisfaction. WE CARE ABOUT YOU!!!    We will call with results of the C-spine films as soon as they become available Continue meloxicam on an as-needed basis and always take it after eating If you develop any kind of GI symptoms like nausea vomiting or heartburn he should discontinue the meloxicam Also watch her blood pressure while taking meloxicam Use nasal saline in addition to Flonase  Arrie Senate MD

## 2016-10-03 NOTE — Patient Instructions (Addendum)
Continue current medications. Continue good therapeutic lifestyle changes which include good diet and exercise. Fall precautions discussed with patient. If an FOBT was given today- please return it to our front desk. If you are over 53 years old - you may need Prevnar 55 or the adult Pneumonia vaccine.  **Flu shots are available--- please call and schedule a FLU-CLINIC appointment**  After your visit with [place] today you will receive a survey in the mail or online from [employer] regarding your care with [place]. Please take a moment to fill this out. Your feedback is very important to [place] as you can help [place] better understand your patient needs as well as improve your experience and satisfaction. WE CARE ABOUT YOU!!!    We will call with results of the C-spine films as soon as they become available Continue meloxicam on an as-needed basis and always take it after eating If you develop any kind of GI symptoms like nausea vomiting or heartburn he should discontinue the meloxicam Also watch her blood pressure while taking meloxicam Use nasal saline in addition to Flonase

## 2016-10-04 LAB — NMR, LIPOPROFILE
Cholesterol: 232 mg/dL — ABNORMAL HIGH (ref 100–199)
HDL Cholesterol by NMR: 54 mg/dL (ref >39)
HDL Particle Number: 31.2 umol/L (ref >=30.5)
LDL PARTICLE NUMBER: 1880 nmol/L — AB (ref <1000)
LDL SIZE: 21.1 nm (ref >20.5)
LDL-C: 134 mg/dL — AB (ref 0–99)
LP-IR SCORE: 67 — AB (ref <=45)
Small LDL Particle Number: 663 nmol/L — ABNORMAL HIGH (ref <=527)
Triglycerides by NMR: 221 mg/dL — ABNORMAL HIGH (ref 0–149)

## 2016-10-04 LAB — CBC WITH DIFFERENTIAL/PLATELET
BASOS ABS: 0 10*3/uL (ref 0.0–0.2)
Basos: 0 %
EOS (ABSOLUTE): 0.2 10*3/uL (ref 0.0–0.4)
Eos: 3 %
HEMATOCRIT: 41.1 % (ref 34.0–46.6)
HEMOGLOBIN: 13.7 g/dL (ref 11.1–15.9)
Immature Grans (Abs): 0 10*3/uL (ref 0.0–0.1)
Immature Granulocytes: 0 %
LYMPHS ABS: 2.5 10*3/uL (ref 0.7–3.1)
Lymphs: 32 %
MCH: 30.5 pg (ref 26.6–33.0)
MCHC: 33.3 g/dL (ref 31.5–35.7)
MCV: 92 fL (ref 79–97)
MONOCYTES: 9 %
MONOS ABS: 0.7 10*3/uL (ref 0.1–0.9)
NEUTROS ABS: 4.5 10*3/uL (ref 1.4–7.0)
Neutrophils: 56 %
Platelets: 295 10*3/uL (ref 150–379)
RBC: 4.49 x10E6/uL (ref 3.77–5.28)
RDW: 13.1 % (ref 12.3–15.4)
WBC: 8 10*3/uL (ref 3.4–10.8)

## 2016-10-04 LAB — HEPATIC FUNCTION PANEL
ALK PHOS: 101 IU/L (ref 39–117)
ALT: 28 IU/L (ref 0–32)
AST: 25 IU/L (ref 0–40)
Albumin: 4.4 g/dL (ref 3.5–5.5)
Bilirubin Total: 0.5 mg/dL (ref 0.0–1.2)
Bilirubin, Direct: 0.11 mg/dL (ref 0.00–0.40)
TOTAL PROTEIN: 7.1 g/dL (ref 6.0–8.5)

## 2016-10-04 LAB — BMP8+EGFR
BUN / CREAT RATIO: 21 (ref 9–23)
BUN: 15 mg/dL (ref 6–24)
CHLORIDE: 99 mmol/L (ref 96–106)
CO2: 25 mmol/L (ref 18–29)
Calcium: 10 mg/dL (ref 8.7–10.2)
Creatinine, Ser: 0.73 mg/dL (ref 0.57–1.00)
GFR calc non Af Amer: 94 mL/min/{1.73_m2} (ref >59)
GFR, EST AFRICAN AMERICAN: 109 mL/min/{1.73_m2} (ref >59)
GLUCOSE: 93 mg/dL (ref 65–99)
Potassium: 5 mmol/L (ref 3.5–5.2)
SODIUM: 141 mmol/L (ref 134–144)

## 2016-10-04 LAB — VITAMIN D 25 HYDROXY (VIT D DEFICIENCY, FRACTURES): VIT D 25 HYDROXY: 53.5 ng/mL (ref 30.0–100.0)

## 2016-10-31 ENCOUNTER — Encounter: Payer: Self-pay | Admitting: Pharmacist

## 2016-10-31 ENCOUNTER — Ambulatory Visit (INDEPENDENT_AMBULATORY_CARE_PROVIDER_SITE_OTHER): Payer: BLUE CROSS/BLUE SHIELD | Admitting: Pharmacist

## 2016-10-31 ENCOUNTER — Telehealth: Payer: Self-pay | Admitting: Pharmacist

## 2016-10-31 VITALS — BP 124/68 | HR 80 | Ht 66.0 in | Wt 179.0 lb

## 2016-10-31 DIAGNOSIS — E782 Mixed hyperlipidemia: Secondary | ICD-10-CM | POA: Diagnosis not present

## 2016-10-31 MED ORDER — AMOXICILLIN 500 MG PO CAPS: 500.0000 mg | ORAL_CAPSULE | Freq: Three times a day (TID) | ORAL | 0 refills | Status: DC

## 2016-10-31 NOTE — Progress Notes (Signed)
Patient ID: Carla Morales, female   DOB: Jan 13, 1961, 55 y.o.   MRN: [id number]   Subjective:    Carla Morales is a 55 y.o. female who is referred by Dr Redge Gainer for evaluation and education regarding dyslipidemia. The patient does not use medications that may worsen dyslipidemias (corticosteroids, progestins, anabolic steroids, diuretics, beta-blockers, amiodarone, cyclosporine, olanzapine).  She has taken pravastatin about 2-3 years ago but stopped due to myalgias.  Exercise: rarely. Previous history of cardiac disease includes: None.  Cardiac Risk Factors Age > 45-female, > 55-female:  NO  Smoking:   NO  Sig. family hx of CHD*:  NO  Hypertension:   NO  Diabetes:   NO  HDL < 35:   NO  HDL > 59:   NO  Total:  0   *Significant family history of CHD per NCEP = MI or sudden death at less than 38 year old in father or other 1st-degree female relative, or less than 59 year old in mother or  other 1st-degree female relative  The following portions of the patient's history were reviewed and updated as appropriate: allergies, current medications, past family history, past medical history, past social history, past surgical history and problem list.    Objective:    Lab Review Office Visit on 10/03/2016  Component Date Value  . Glucose 10/04/2016 93   . BUN 10/04/2016 15   . Creatinine, Ser 10/04/2016 0.73   . GFR calc non Af Amer 10/04/2016 94   . GFR calc Af Amer 10/04/2016 109   . BUN/Creatinine Ratio 10/04/2016 21   . Sodium 10/04/2016 141   . Potassium 10/04/2016 5.0   . Chloride 10/04/2016 99   . CO2 10/04/2016 25   . Calcium 10/04/2016 10.0   . WBC 10/04/2016 8.0   . RBC 10/04/2016 4.49   . Hemoglobin 10/04/2016 13.7   . Hematocrit 10/04/2016 41.1   . MCV 10/04/2016 92   . [facility] 10/04/2016 30.5   . MCHC 10/04/2016 33.3   . RDW 10/04/2016 13.1   . Platelets 10/04/2016 295   . Neutrophils 10/04/2016 56   . Lymphs 10/04/2016 32   . Monocytes 10/04/2016 9   . Eos  10/04/2016 3   . Basos 10/04/2016 0   . Neutrophils Absolute 10/04/2016 4.5   . Lymphocytes Absolute 10/04/2016 2.5   . Monocytes Absolute 10/04/2016 0.7   . EOS (ABSOLUTE) 10/04/2016 0.2   . Basophils Absolute 10/04/2016 0.0   . Immature Granulocytes 10/04/2016 0   . Immature Grans (Abs) 10/04/2016 0.0   . Total Protein 10/04/2016 7.1   . Albumin 10/04/2016 4.4   . Bilirubin Total 10/04/2016 0.5   . Bilirubin, Direct 10/04/2016 0.11   . Alkaline Phosphatase 10/04/2016 101   . AST 10/04/2016 25   . ALT 10/04/2016 28   . Vit D, 25-Hydroxy 10/04/2016 53.5   . LDL Particle Number 10/04/2016 1880*  . LDL-C 10/04/2016 134*  . HDL Cholesterol by NMR 10/04/2016 54   . Triglycerides by NMR 10/04/2016 221*  . Cholesterol 10/04/2016 232*  . HDL Particle Number 10/04/2016 31.2   . Small LDL Particle Number 10/04/2016 663*  . LDL Size 10/04/2016 21.1   . LP-IR Score 10/04/2016 67*      Assessment:    Dyslipidemia as detailed above with 0 CHD risk factors using NCEP scheme above. Recommend LDL target os less than 130.  Patients AHA ASCVD 10 year estimated risk = 2%  Explained to the patient the respective  contributions of genetics, diet, and exercise to lipid levels and the use of medication in severe cases which do not respond to lifestyle alteration. The patient's interest and motivation in making lifestyle changes seems good.     Plan:    The following changes are planned for the next 5 months, at which time the patient will return for repeat fasting lipids:  1. Dietary changes: Increase soluble fiber Reduce saturated fat, "trans" fat, and cholesterol.  Discussed increasing fruits and non starchy vegetables Switch from sugar containing beverages to water, unsweet tea or coffee. Limit red meat in take and increase lean proteins such as beans and fish 2. Exercise changes:  Discussed increasing physical activity to goal of 150 minutes per week - suggested either walking or joining  gym 3. Other treatment None 4. Lipid-lowering medications: None at present. Will consider if LDL > 130 on recheck. Will consider Chanda Busing is insurance will allow due to lower incidence of increasing BG compared to other statins. (Recommended by NCEP after 3-6 months of dietary therapy & lifestyle modification,  except if CHD is present or LDL well above 190.) 5. Hormone replacement therapy (patient is a postmenopausal  woman): yes - and is already using estrogen patches daily 6. Screening for secondary causes of dyslipidemias:None indicated  Note: The majority of the visit was spent in counseling on the pathophysiology and treatment of dyslipidemias. The total face-to-face time was in excess of 30 minutes.

## 2016-10-31 NOTE — Telephone Encounter (Signed)
Med ordered and pt awrae

## 2016-10-31 NOTE — Telephone Encounter (Signed)
Add amoxicillin 503 times daily for 10 days. Continue with Flonase and use nasal saline regularly

## 2017-01-16 ENCOUNTER — Other Ambulatory Visit: Payer: Self-pay | Admitting: Family Medicine

## 2017-01-16 DIAGNOSIS — Z1231 Encounter for screening mammogram for malignant neoplasm of breast: Secondary | ICD-10-CM

## 2017-02-01 ENCOUNTER — Ambulatory Visit
Admission: RE | Admit: 2017-02-01 | Discharge: 2017-02-01 | Disposition: A | Discharge status: Home / Self Care | Payer: BLUE CROSS/BLUE SHIELD | Source: Ambulatory Visit | Attending: Family Medicine | Admitting: Family Medicine

## 2017-02-01 DIAGNOSIS — Z1231 Encounter for screening mammogram for malignant neoplasm of breast: Secondary | ICD-10-CM | POA: Diagnosis not present

## 2017-02-14 ENCOUNTER — Encounter: Payer: Self-pay | Admitting: Family Medicine

## 2017-02-14 ENCOUNTER — Ambulatory Visit (INDEPENDENT_AMBULATORY_CARE_PROVIDER_SITE_OTHER): Payer: BLUE CROSS/BLUE SHIELD | Admitting: Family Medicine

## 2017-02-14 VITALS — BP 124/66 | HR 82 | Temp 99.5°F | Ht 66.0 in | Wt 182.0 lb

## 2017-02-14 DIAGNOSIS — J301 Allergic rhinitis due to pollen: Secondary | ICD-10-CM | POA: Diagnosis not present

## 2017-02-14 LAB — VERITOR FLU A/B WAIVED
INFLUENZA A: NEGATIVE
INFLUENZA B: NEGATIVE

## 2017-02-14 MED ORDER — ALBUTEROL SULFATE HFA 108 (90 BASE) MCG/ACT IN AERS: 2.0000 | INHALATION_SPRAY | Freq: Four times a day (QID) | RESPIRATORY_TRACT | 2 refills | Status: DC | PRN

## 2017-02-14 MED ORDER — HYDROCODONE-HOMATROPINE 5-1.5 MG/5ML PO SYRP: 5.0000 mL | ORAL_SOLUTION | Freq: Four times a day (QID) | ORAL | 0 refills | Status: DC | PRN

## 2017-02-14 NOTE — Progress Notes (Signed)
BP 124/66   Pulse 82   Temp 99.5 F (37.5 C) (Oral)   Ht 5\' 6"  (1.676 m)   Wt 182 lb (82.6 kg)   LMP 07/24/2010   BMI 29.38 kg/m    Subjective:    Patient ID: Carla Morales, female    DOB: Jul 25, 1961, 56 y.o.   MRN: [id number]  HPI: Carla Morales is a 56 y.o. female presenting on 02/14/2017 for Cough (needs inhaler refilled and wants cough syrup)   HPI Cough and nasal congestion and sinus drainage and headache Patient has been having cough and nasal congestion and sinus drainage and a headache that's been going on for the past 2 weeks. She has an albuterol inhaler and wants that refilled because it helps her when she gets like this. She does admit that she gets allergies frequently this time year and she has been using an allergy pill and a nasal spray. She denies any fevers or chills or shortness of breath or wheezing. She just feels like her chest gets a little tight and she's been having coughing spells. She says the coughing spells are especially worse at night.  Relevant past medical, surgical, family and social history reviewed and updated as indicated. Interim medical history since our last visit reviewed. Allergies and medications reviewed and updated.  Review of Systems  Constitutional: Negative for chills and fever.  HENT: Positive for congestion, postnasal drip, rhinorrhea, sinus pressure, sneezing and sore throat. Negative for ear discharge and ear pain.   Eyes: Negative for pain, redness and visual disturbance.  Respiratory: Positive for cough and chest tightness. Negative for shortness of breath and wheezing.   Cardiovascular: Negative for chest pain and leg swelling.  Genitourinary: Negative for difficulty urinating and dysuria.  Musculoskeletal: Negative for back pain and gait problem.  Skin: Negative for rash.  Neurological: Negative for light-headedness and headaches.  Psychiatric/Behavioral: Negative for agitation and behavioral problems.  All other systems  reviewed and are negative.   Per HPI unless specifically indicated above     Objective:    BP 124/66   Pulse 82   Temp 99.5 F (37.5 C) (Oral)   Ht 5\' 6"  (1.676 m)   Wt 182 lb (82.6 kg)   LMP 07/24/2010   BMI 29.38 kg/m   Wt Readings from Last 3 Encounters:  02/14/17 182 lb (82.6 kg)  10/31/16 179 lb (81.2 kg)  10/03/16 178 lb (80.7 kg)    Physical Exam  Constitutional: She is oriented to person, place, and time. She appears well-developed and well-nourished. No distress.  HENT:  Right Ear: Tympanic membrane, external ear and ear canal normal.  Left Ear: Tympanic membrane, external ear and ear canal normal.  Nose: Mucosal edema and rhinorrhea present. No epistaxis. Right sinus exhibits no maxillary sinus tenderness and no frontal sinus tenderness. Left sinus exhibits no maxillary sinus tenderness and no frontal sinus tenderness.  Mouth/Throat: Uvula is midline and mucous membranes are normal. Posterior oropharyngeal edema and posterior oropharyngeal erythema present. No oropharyngeal exudate or tonsillar abscesses.  Eyes: Conjunctivae are normal.  Neck: Neck supple.  Cardiovascular: Normal rate, regular rhythm, normal heart sounds and intact distal pulses.   No murmur heard. Pulmonary/Chest: Effort normal and breath sounds normal. No respiratory distress. She has no wheezes. She has no rales.  Musculoskeletal: Normal range of motion. She exhibits no edema or tenderness.  Lymphadenopathy:    She has no cervical adenopathy.  Neurological: She is alert and oriented to person, place, and time.  Coordination normal.  Skin: Skin is warm and dry. No rash noted. She is not diaphoretic.  Psychiatric: She has a normal mood and affect. Her behavior is normal.  Vitals reviewed.     Assessment & Plan:   Problem List Items Addressed This Visit    None    Visit Diagnoses    Acute seasonal allergic rhinitis due to pollen    -  Primary   Relevant Medications   albuterol (PROVENTIL  HFA) 108 (90 Base) MCG/ACT inhaler   HYDROcodone-homatropine (HYCODAN) 5-1.5 MG/5ML syrup   Other Relevant Orders   Veritor Flu A/B Waived (Completed)       Follow up plan: Return if symptoms worsen or fail to improve.  Counseling provided for all of the vaccine components Orders Placed This Encounter  Procedures  . Veritor Flu A/B [place], MD [facility] 02/14/2017, 6:10 PM

## 2017-02-21 ENCOUNTER — Ambulatory Visit (INDEPENDENT_AMBULATORY_CARE_PROVIDER_SITE_OTHER): Payer: BLUE CROSS/BLUE SHIELD | Admitting: Gynecology

## 2017-02-21 ENCOUNTER — Encounter: Payer: Self-pay | Admitting: Gynecology

## 2017-02-21 VITALS — BP 118/76 | Ht 66.0 in | Wt 181.0 lb

## 2017-02-21 DIAGNOSIS — Z01419 Encounter for gynecological examination (general) (routine) without abnormal findings: Secondary | ICD-10-CM

## 2017-02-21 DIAGNOSIS — Z79890 Hormone replacement therapy (postmenopausal): Secondary | ICD-10-CM | POA: Diagnosis not present

## 2017-02-21 MED ORDER — ESTRADIOL 0.075 MG/24HR TD PTTW: 1.0000 | MEDICATED_PATCH | TRANSDERMAL | 4 refills | Status: DC

## 2017-02-21 NOTE — Progress Notes (Signed)
    Carla Morales [birth year] [id number]        56 y.o.  G1P1 for annual exam.    Past medical history,surgical history, problem list, medications, allergies, family history and social history were all reviewed and documented as reviewed in the EPIC chart.  ROS:  Performed with pertinent positives and negatives included in the history, assessment and plan.   Additional significant findings :  None   Exam: [name] assistant Vitals:   02/21/17 0753  BP: 118/76  Weight: 181 lb (82.1 kg)  Height: 5\' 6"  (1.676 m)   Body mass index is 29.21 kg/m.  General appearance:  Normal affect, orientation and appearance. Skin: Grossly normal HEENT: Without gross lesions.  No cervical or supraclavicular adenopathy. Thyroid normal.  Lungs:  Clear without wheezing, rales or rhonchi Cardiac: RR, without RMG Abdominal:  Soft, nontender, without masses, guarding, rebound, organomegaly or hernia Breasts:  Examined lying and sitting without masses, retractions, discharge or axillary adenopathy. Pelvic:  Ext, BUS, Vagina: Normal  Adnexa: Without masses or tenderness    Anus and perineum: Normal   Rectovaginal: Normal sphincter tone without palpated masses or tenderness.    Assessment/Plan:  56 y.o. G1P1 female for annual exam.   1. As menopausal/HRT. Status post [facility] 2011 for menorrhagia, leiomyoma and prolapse. On Vivelle 0.075 mg patch doing well and wants to continue. I again reviewed the risks versus benefits of HRT to include the latest 2017 names guidelines. Benefits of symptom relief and possible cardiovascular/bone health with early initiation versus risks of stroke heart attack DVT and breast cancer you have all reviewed. Patient desires to continue and I refilled her 1 year. 2. Mammography 01/2017. Continue with annual mammography when due. SBE monthly reviewed. Mother with history of breast cancer uterine cancer and lung cancer. The mother has an appointment with genetic counselor this  coming month the patient will follow up with her in reference to these results. I reviewed various scenarios with the patient. 3. DEXA 2016 normal. Plan repeat DEXA at age 54. 14. Pap smear 10/2014. No Pap smear done today. No history of abnormal Pap smears. Options to stop screening altogether per current screening guidelines and hysterectomy history versus less frequent screening intervals reviewed. Will readdress on annual basis. 5. Colonoscopy 2015. Repeat at their recommended interval. 6. Health maintenance. No routine lab work done as patient does this elsewhere. Follow up 1 year, sooner as needed.   Anastasio Auerbach MD, 8:19 AM 02/21/2017

## 2017-02-21 NOTE — Patient Instructions (Signed)

## 2017-02-21 NOTE — Progress Notes (Signed)
Routine GYN exam

## 2017-03-03 ENCOUNTER — Other Ambulatory Visit: Payer: Self-pay | Admitting: Family Medicine

## 2017-03-20 ENCOUNTER — Ambulatory Visit (INDEPENDENT_AMBULATORY_CARE_PROVIDER_SITE_OTHER): Payer: BLUE CROSS/BLUE SHIELD | Admitting: Physician Assistant

## 2017-03-20 ENCOUNTER — Encounter: Payer: Self-pay | Admitting: Physician Assistant

## 2017-03-20 ENCOUNTER — Ambulatory Visit (INDEPENDENT_AMBULATORY_CARE_PROVIDER_SITE_OTHER): Payer: BLUE CROSS/BLUE SHIELD

## 2017-03-20 VITALS — BP 101/63 | HR 69 | Temp 98.3°F | Ht 66.0 in | Wt 183.0 lb

## 2017-03-20 DIAGNOSIS — M25572 Pain in left ankle and joints of left foot: Secondary | ICD-10-CM

## 2017-03-20 DIAGNOSIS — S93402A Sprain of unspecified ligament of left ankle, initial encounter: Secondary | ICD-10-CM

## 2017-03-20 NOTE — Patient Instructions (Signed)
Ankle Sprain An ankle sprain is a stretch or tear in one of the tough, fiber-like tissues (ligaments) in the ankle. The ligaments in your ankle help to hold the bones of the ankle together. What are the causes? This condition is often caused by stepping on or falling on the outer edge of the foot. What increases the risk? This condition is more likely to develop in people who play sports. What are the signs or symptoms? Symptoms of this condition include:  Pain in your ankle.  Swelling.  Bruising. Bruising may develop right after you sprain your ankle or 1-2 days later.  Trouble standing or walking, especially when you turn or change directions. How is this diagnosed? This condition is diagnosed with a physical exam. During the exam, your health care provider will press on certain parts of your foot and ankle and try to move them in certain ways. X-rays may be taken to see how severe the sprain is and to check for broken bones. How is this treated? This condition may be treated with:  A brace. This is used to keep the ankle from moving until it heals.  An elastic bandage. This is used to support the ankle.  Crutches.  Pain medicine.  Surgery. This may be needed if the sprain is severe.  Physical therapy. This may help to improve the range of motion in the ankle. Follow these instructions at home:  Rest your ankle.  Take over-the-counter and prescription medicines only as told by your health care provider.  For 2-3 days, keep your ankle raised (elevated) above the level of your heart as much as possible.  If directed, apply ice to the area:  Put ice in a plastic bag.  Place a towel between your skin and the bag.  Leave the ice on for 20 minutes, 2-3 times a day.  If you were given a brace:  Wear it as directed.  Remove it to shower or bathe.  Try not to move your ankle much, but wiggle your toes from time to time. This helps to prevent swelling.  If you were  given an elastic bandage (dressing):  Remove it to shower or bathe.  Try not to move your ankle much, but wiggle your toes from time to time. This helps to prevent swelling.  Adjust the dressing to make it more comfortable if it feels too tight.  Loosen the dressing if you have numbness or tingling in your foot, or if your foot becomes cold and blue.  If you have crutches, use them as told by your health care provider. Continue to use them until you can walk without feeling pain in your ankle. Contact a health care provider if:  You have rapidly increasing bruising or swelling.  Your pain is not relieved with medicine. Get help right away if:  Your toes or foot becomes numb or blue.  You have severe pain that gets worse. This information is not intended to replace advice given to you by your health care provider. Make sure you discuss any questions you have with your health care provider. Document Released: 10/30/2005 Document Revised: 03/08/2016 Document Reviewed: 06/01/2015 Elsevier Interactive Patient Education  2017 Elsevier Inc.  

## 2017-03-22 NOTE — Progress Notes (Signed)
BP 101/63   Pulse 69   Temp 98.3 F (36.8 C) (Oral)   Ht 5\' 6"  (1.676 m)   Wt 183 lb (83 kg)   LMP 07/24/2010   BMI 29.54 kg/m    Subjective:    Patient ID: Carla Morales, female    DOB: 16-Jul-1961, 56 y.o.   MRN: [id number]  HPI: Carla Morales is a 56 y.o. female presenting on 03/20/2017 for Fall (stepped off deck and fell and hurt left foot and ankle )  Hurt herself this morning and very painful to weight bear. Decreased ROM and swelling right away.  No previous injury.  Relevant past medical, surgical, family and social history reviewed and updated as indicated. Allergies and medications reviewed and updated.  Past Medical History:  Diagnosis Date  . Allergy   . Arthritis   . Asthma   . Hyperlipidemia   . Migraine   . Pinched nerve in neck   . Sinus congestion   . Urinary incontinence     Past Surgical History:  Procedure Laterality Date  . bladder dialation  1968   as a child  . TONSILLECTOMY  1971  . TOTAL VAGINAL HYSTERECTOMY  07/2010   menorrhagia, leiomyoma, prolapse  . TUBAL LIGATION  1998    Review of Systems  Constitutional: Negative.   HENT: Negative.   Eyes: Negative.   Respiratory: Negative.   Gastrointestinal: Negative.   Genitourinary: Negative.     Allergies as of 03/20/2017      Reactions   Other Swelling   Contrast dye   Haldol [haloperidol Decanoate]    anxiety   Pravastatin Other (See Comments)   myalgias   Sulfa Antibiotics Rash      Medication List       Accurate as of 03/20/17 11:59 PM. Always use your most recent med list.          albuterol 108 (90 Base) MCG/ACT inhaler Commonly known as:  PROVENTIL HFA Inhale 2 puffs into the lungs every 6 (six) hours as needed for wheezing or shortness of breath.   ALPRAZolam 0.5 MG tablet Commonly known as:  XANAX Take 1 tablet (0.5 mg total) by mouth at bedtime as needed for anxiety.   calcium carbonate 1500 (600 Ca) MG Tabs tablet Commonly known as:  OSCAL Take 600 mg of  elemental calcium by mouth daily with breakfast.   CLARITIN PO Take 1 tablet by mouth daily.   cyclobenzaprine 10 MG tablet Commonly known as:  FLEXERIL Take 1 tablet (10 mg total) by mouth at bedtime.   estradiol 0.075 MG/24HR Commonly known as:  VIVELLE-DOT Place 1 patch onto the skin 2 (two) times a week.   fluticasone 50 MCG/ACT nasal spray Commonly known as:  FLONASE USE 2 SPRAYS INTO BOTH NOST RILS DAILY   HYDROcodone-homatropine 5-1.5 MG/5ML syrup Commonly known as:  HYCODAN Take 5 mLs by mouth every 6 (six) hours as needed for cough.   meclizine 12.5 MG tablet Commonly known as:  ANTIVERT Take 1 tablet (12.5 mg total) by mouth 3 (three) times daily as needed for dizziness.   meloxicam 15 MG tablet Commonly known as:  MOBIC Take 1 tablet (15 mg total) by mouth daily.   MUCINEX PO Take by mouth.   Omega-3 Krill Oil 500 MG Caps Take 1 capsule by mouth daily.   QVAR 40 MCG/ACT inhaler Generic drug:  beclomethasone INHALE 2 PUFFS INTO THE LUNGS 2 (TWO) TIMES DAILY BEFORE A MEAL.   Turmeric 500  MG Caps Take by mouth.   Vitamin D (Ergocalciferol) 50000 units Caps capsule Commonly known as:  DRISDOL TAKE 1 CAPSULE BY MOUTH EVERY 7 DAYS. GENERIC EQUIVALENT FOR DRISDOL          Objective:    BP 101/63   Pulse 69   Temp 98.3 F (36.8 C) (Oral)   Ht 5\' 6"  (1.676 m)   Wt 183 lb (83 kg)   LMP 07/24/2010   BMI 29.54 kg/m   Allergies  Allergen Reactions  . Other Swelling    Contrast dye  . Haldol [Haloperidol Decanoate]     anxiety  . Pravastatin Other (See Comments)    myalgias  . Sulfa Antibiotics Rash    Physical Exam  Constitutional: She is oriented to person, place, and time. She appears well-developed and well-nourished.  HENT:  Head: Normocephalic and atraumatic.  Eyes: Conjunctivae and EOM are normal. Pupils are equal, round, and reactive to light.  Cardiovascular: Normal rate, regular rhythm, normal heart sounds and intact distal pulses.     Pulmonary/Chest: Effort normal and breath sounds normal.  Abdominal: Soft. Bowel sounds are normal.  Musculoskeletal:       Left ankle: She exhibits decreased range of motion and swelling. She exhibits no ecchymosis and no deformity. Tenderness.       Feet:  Neurological: She is alert and oriented to person, place, and time. She has normal reflexes.  Skin: Skin is warm and dry. No rash noted.  Psychiatric: She has a normal mood and affect. Her behavior is normal. Judgment and thought content normal.        Assessment & Plan:   1. Acute left ankle pain - DG Ankle Complete Left; Future Xray normal  2. Sprain of left ankle, unspecified ligament, initial encounter Non weight bearing 1-2 weeks. Script for crutches given  Continue all other maintenance medications as listed above.  Follow up plan: Return if symptoms worsen or fail to improve.  Educational handout given for sprain  Terald Sleeper PA-C [facility] [address]  [place], [place] [address] [phone]   03/22/2017, 10:02 PM

## 2017-03-28 ENCOUNTER — Ambulatory Visit (INDEPENDENT_AMBULATORY_CARE_PROVIDER_SITE_OTHER): Payer: BLUE CROSS/BLUE SHIELD

## 2017-03-28 ENCOUNTER — Encounter: Payer: Self-pay | Admitting: Family Medicine

## 2017-03-28 ENCOUNTER — Ambulatory Visit (INDEPENDENT_AMBULATORY_CARE_PROVIDER_SITE_OTHER): Payer: BLUE CROSS/BLUE SHIELD | Admitting: Family Medicine

## 2017-03-28 VITALS — BP 108/66 | HR 72 | Temp 97.0°F | Ht 66.0 in | Wt 183.0 lb

## 2017-03-28 DIAGNOSIS — S93402D Sprain of unspecified ligament of left ankle, subsequent encounter: Secondary | ICD-10-CM | POA: Diagnosis not present

## 2017-03-28 DIAGNOSIS — E7800 Pure hypercholesterolemia, unspecified: Secondary | ICD-10-CM | POA: Diagnosis not present

## 2017-03-28 DIAGNOSIS — Z13820 Encounter for screening for osteoporosis: Secondary | ICD-10-CM

## 2017-03-28 DIAGNOSIS — E559 Vitamin D deficiency, unspecified: Secondary | ICD-10-CM

## 2017-03-28 DIAGNOSIS — S149XXA Injury of unspecified nerves of neck, initial encounter: Secondary | ICD-10-CM

## 2017-03-28 DIAGNOSIS — J9801 Acute bronchospasm: Secondary | ICD-10-CM | POA: Diagnosis not present

## 2017-03-28 DIAGNOSIS — G588 Other specified mononeuropathies: Secondary | ICD-10-CM | POA: Diagnosis not present

## 2017-03-28 DIAGNOSIS — J301 Allergic rhinitis due to pollen: Secondary | ICD-10-CM | POA: Diagnosis not present

## 2017-03-28 DIAGNOSIS — Z780 Asymptomatic menopausal state: Secondary | ICD-10-CM

## 2017-03-28 DIAGNOSIS — G589 Mononeuropathy, unspecified: Secondary | ICD-10-CM

## 2017-03-28 MED ORDER — ALBUTEROL SULFATE HFA 108 (90 BASE) MCG/ACT IN AERS: 2.0000 | INHALATION_SPRAY | Freq: Four times a day (QID) | RESPIRATORY_TRACT | 3 refills | Status: DC | PRN

## 2017-03-28 NOTE — Addendum Note (Signed)
Addended by: Zannie Cove on: 03/28/2017 10:44 AM   Modules accepted: Orders

## 2017-03-28 NOTE — Patient Instructions (Addendum)
Continue current medications. Continue good therapeutic lifestyle changes which include good diet and exercise. Fall precautions discussed with patient. If an FOBT was given today- please return it to our front desk. If you are over 54 years old - you may need Prevnar 49 or the adult Pneumonia vaccine.  **Flu shots are available--- please call and schedule a FLU-CLINIC appointment**  After your visit with [place] today you will receive a survey in the mail or online from [employer] regarding your care with [place]. Please take a moment to fill this out. Your feedback is very important to [place] as you can help [place] better understand your patient needs as well as improve your experience and satisfaction. WE CARE ABOUT YOU!!!   We will call with results of the lab work chest x-ray results and DEXA scan as soon as they become available Make all efforts at preventing falls and avoid climbing as much as possible Continue calcium and vitamin D Continue to follow-up with gynecology Try Rigoberto Noel to see if this helps with extra fluid intake Make all efforts at losing weight through diet and exercise

## 2017-03-28 NOTE — Progress Notes (Signed)
Subjective:    Patient ID: Carla Morales, female    DOB: [birth year], 56 y.o.   MRN: [id number]  HPI Pt here for follow up and management of chronic medical problems which includes hyperlipidemia. She is taking medication regularly.The patient has had a recent sprain of the left ankle. She is due to get a chest x-ray will be given an FOBT and will get lab work today. She had a colonoscopy in November 2015. She gets her pelvic exams with Dr. Phineas Real and had a mammogram done in March of this year. She is requesting a refill on Proventil. This patient has a history of asthma and bronchospasm. The patient denies any chest pain pressure or tightness. She does have problems with her asthma usually during the winter months when she picks up a virus and this is when she uses her albuterol more regularly. She denies any trouble with swallowing. She does have occasional heartburn and takes ranitidine for this. She remains on the constipated side and sometimes has some bright red blood per rectum with constipation. Otherwise no problems with her stomach including nausea vomiting or diarrhea. She is passing her water without problems. He is positive for breast cancer and uterine cancer. Her father also has kidney stones and is followed regularly by the urologist. The last time she had a colonoscopy was for routine purposes and is not do another colonoscopy for 10 years from that date which would be 2025. She does take hormone replacement therapy from her gynecologist. She has had a hysterectomy. Her left ankle sprain is improving and she did not have a lot of swelling with this and will transition to an ankle splint and 7 Ace bandage.    Patient Active Problem List   Diagnosis Date Noted  . Chest pressure 11/27/2013  . Bronchospasm 11/27/2013  . Allergy   . Hyperlipidemia   . Migraine   . Pinched nerve in neck    Outpatient Encounter Prescriptions as of 03/28/2017  Medication Sig  . albuterol (PROVENTIL  HFA) 108 (90 Base) MCG/ACT inhaler Inhale 2 puffs into the lungs every 6 (six) hours as needed for wheezing or shortness of breath.  . ALPRAZolam (XANAX) 0.5 MG tablet Take 1 tablet (0.5 mg total) by mouth at bedtime as needed for anxiety.  . calcium carbonate (OSCAL) 1500 (600 CA) MG TABS tablet Take 600 mg of elemental calcium by mouth daily with breakfast.  . cyclobenzaprine (FLEXERIL) 10 MG tablet Take 1 tablet (10 mg total) by mouth at bedtime.  Marland Kitchen estradiol (VIVELLE-DOT) 0.075 MG/24HR Place 1 patch onto the skin 2 (two) times a week.  . fluticasone (FLONASE) 50 MCG/ACT nasal spray USE 2 SPRAYS INTO BOTH NOST RILS DAILY  . Loratadine (CLARITIN PO) Take 1 tablet by mouth daily.   . meloxicam (MOBIC) 15 MG tablet Take 1 tablet (15 mg total) by mouth daily.  . Omega-3 Krill Oil 500 MG CAPS Take 1 capsule by mouth daily.  . ranitidine (ZANTAC) 150 MG tablet Take 150 mg by mouth at bedtime.  . Turmeric 500 MG CAPS Take by mouth.  Marland Kitchen UNABLE TO FIND Med Name: apple cider vinegar  . Vitamin D, Ergocalciferol, (DRISDOL) 50000 units CAPS capsule TAKE 1 CAPSULE BY MOUTH EVERY 7 DAYS. GENERIC EQUIVALENT FOR DRISDOL  . [DISCONTINUED] GuaiFENesin (MUCINEX PO) Take by mouth.  . [DISCONTINUED] HYDROcodone-homatropine (HYCODAN) 5-1.5 MG/5ML syrup Take 5 mLs by mouth every 6 (six) hours as needed for cough. (Patient not taking: Reported on 03/20/2017)  . [  DISCONTINUED] meclizine (ANTIVERT) 12.5 MG tablet Take 1 tablet (12.5 mg total) by mouth 3 (three) times daily as needed for dizziness. (Patient not taking: Reported on 02/21/2017)  . [DISCONTINUED] QVAR 40 MCG/ACT inhaler INHALE 2 PUFFS INTO THE LUNGS 2 (TWO) TIMES DAILY BEFORE A MEAL. (Patient not taking: Reported on 10/31/2016)   No facility-administered encounter medications on file as of 03/28/2017.       Review of Systems  Constitutional: Negative.   HENT: Negative.   Eyes: Negative.   Respiratory: Negative.   Cardiovascular: Positive for leg swelling  (ankle and feet swelling).  Gastrointestinal: Negative.   Endocrine: Negative.   Genitourinary: Negative.   Musculoskeletal: Positive for arthralgias (still having left ankle pain and swelling).  Skin: Negative.   Allergic/Immunologic: Negative.   Neurological: Negative.   Hematological: Negative.   Psychiatric/Behavioral: Negative.        Objective:   Physical Exam  Constitutional: She is oriented to person, place, and time. She appears well-developed and well-nourished. No distress.  The patient is pleasant and alert  HENT:  Head: Normocephalic and atraumatic.  Right Ear: External ear normal.  Left Ear: External ear normal.  Mouth/Throat: Oropharynx is clear and moist.  Nasal turbinate congestion right greater than left. Patient uses Flonase regularly and takes Claritin.  Eyes: Conjunctivae and EOM are normal. Pupils are equal, round, and reactive to light. Right eye exhibits no discharge. Left eye exhibits no discharge. No scleral icterus.  Neck: Normal range of motion. Neck supple. No thyromegaly present.  No bruits thyromegaly or anterior cervical adenopathy  Cardiovascular: Normal rate, regular rhythm, normal heart sounds and intact distal pulses.   No murmur heard. Heart is regular at 84/m  Pulmonary/Chest: Effort normal and breath sounds normal. No respiratory distress. She has no wheezes. She has no rales. She exhibits no tenderness.  Clear anteriorly and posteriorly and no wheezes or rhonchi  Abdominal: Soft. Bowel sounds are normal. She exhibits no mass. There is no tenderness. There is no rebound and no guarding.  No abdominal tenderness masses or organ enlargement or bruits. No inguinal adenopathy. No suprapubic tenderness.  Musculoskeletal: Normal range of motion. She exhibits no edema.  Slight edema left lower leg secondary to recent ankle sprain  Lymphadenopathy:    She has no cervical adenopathy.  Neurological: She is alert and oriented to person, place, and  time. She has normal reflexes. No cranial nerve deficit.  Skin: Skin is warm and dry. No rash noted.  Discuss with dermatology irritated skin lesion at the left side of nose.  Psychiatric: She has a normal mood and affect. Her behavior is normal. Judgment and thought content normal.  Nursing note and vitals reviewed.  BP 108/66 (BP Location: Right Arm)   Pulse 72   Temp 97 F (36.1 C) (Oral)   Ht 5\' 6"  (1.676 m)   Wt 183 lb (83 kg)   LMP 07/24/2010   BMI 29.54 kg/m   The patient will get a DEXA scan chest x-ray and lab work today. All of this is pending.      Assessment & Plan:  1. Pure hypercholesterolemia -Continue with aggressive therapeutic lifestyle changes and make all efforts at lowering the current BMI through diet and exercise - DG [facility]; Future  2. Vitamin D deficiency -Continue current treatment pending results of lab work - DG [place]; Future  3. Postmenopausal - DG [facility] DEXA; Future  4. Screening for osteoporosis - DG [facility] DEXA; Future  5. Acute seasonal  allergic rhinitis due to pollen - albuterol (PROVENTIL HFA) 108 (90 Base) MCG/ACT inhaler; Inhale 2 puffs into the lungs every 6 (six) hours as needed for wheezing or shortness of breath.  Dispense: 54 Inhaler; Refill: 3 -Continue with Claritin and Flonase  6. Pinched nerve in neck -Positional changes at work have helped  7. Sprain of left ankle, unspecified ligament, subsequent encounter -Switch to ankle splint versus Ace bandage and use this for the next couple weeks.  8. Bronchospasm -This is always worse in the wintertime she is currently doing well with this and using when necessary Proventil.  Meds ordered this encounter  Medications  . UNABLE TO FIND    Sig: Med Name: apple cider vinegar  . ranitidine (ZANTAC) 150 MG tablet    Sig: Take 150 mg by mouth at bedtime.  Marland Kitchen albuterol (PROVENTIL HFA) 108 (90 Base) MCG/ACT inhaler    Sig: Inhale 2 puffs into the lungs every 6 (six) hours as  needed for wheezing or shortness of breath.    Dispense:  54 Inhaler    Refill:  3   Patient Instructions  Continue current medications. Continue good therapeutic lifestyle changes which include good diet and exercise. Fall precautions discussed with patient. If an FOBT was given today- please return it to our front desk. If you are over 80 years old - you may need Prevnar 15 or the adult Pneumonia vaccine.  **Flu shots are available--- please call and schedule a FLU-CLINIC appointment**  After your visit with [place] today you will receive a survey in the mail or online from [employer] regarding your care with [place]. Please take a moment to fill this out. Your feedback is very important to [place] as you can help [place] better understand your patient needs as well as improve your experience and satisfaction. WE CARE ABOUT YOU!!!   We will call with results of the lab work chest x-ray results and DEXA scan as soon as they become available Make all efforts at preventing falls and avoid climbing as much as possible Continue calcium and vitamin D Continue to follow-up with gynecology Try Rigoberto Noel to see if this helps with extra fluid intake Make all efforts at losing weight through diet and exercise  Arrie Senate MD

## 2017-03-29 LAB — CBC WITH DIFFERENTIAL/PLATELET
BASOS: 0 %
Basophils Absolute: 0 10*3/uL (ref 0.0–0.2)
EOS (ABSOLUTE): 0.3 10*3/uL (ref 0.0–0.4)
Eos: 4 %
Hematocrit: 38.6 % (ref 34.0–46.6)
Hemoglobin: 13 g/dL (ref 11.1–15.9)
IMMATURE GRANULOCYTES: 0 %
Immature Grans (Abs): 0 10*3/uL (ref 0.0–0.1)
LYMPHS: 33 %
Lymphocytes Absolute: 2.7 10*3/uL (ref 0.7–3.1)
MCH: 30.4 pg (ref 26.6–33.0)
MCHC: 33.7 g/dL (ref 31.5–35.7)
MCV: 90 fL (ref 79–97)
MONOS ABS: 0.6 10*3/uL (ref 0.1–0.9)
Monocytes: 7 %
NEUTROS PCT: 56 %
Neutrophils Absolute: 4.6 10*3/uL (ref 1.4–7.0)
PLATELETS: 280 10*3/uL (ref 150–379)
RBC: 4.28 x10E6/uL (ref 3.77–5.28)
RDW: 13.5 % (ref 12.3–15.4)
WBC: 8.3 10*3/uL (ref 3.4–10.8)

## 2017-03-29 LAB — NMR, LIPOPROFILE
Cholesterol: 222 mg/dL — ABNORMAL HIGH (ref 100–199)
HDL CHOLESTEROL BY NMR: 46 mg/dL (ref >39)
HDL Particle Number: 26.7 umol/L — ABNORMAL LOW (ref >=30.5)
LDL Particle Number: 1556 nmol/L — ABNORMAL HIGH (ref <1000)
LDL Size: 20.6 nm (ref >20.5)
LDL-C: 114 mg/dL — ABNORMAL HIGH (ref 0–99)
LP-IR Score: 67 — ABNORMAL HIGH (ref <=45)
SMALL LDL PARTICLE NUMBER: 486 nmol/L (ref <=527)
TRIGLYCERIDES BY NMR: 308 mg/dL — AB (ref 0–149)

## 2017-03-29 LAB — HEPATIC FUNCTION PANEL
ALBUMIN: 4.1 g/dL (ref 3.5–5.5)
ALT: 17 IU/L (ref 0–32)
AST: 18 IU/L (ref 0–40)
Alkaline Phosphatase: 89 IU/L (ref 39–117)
BILIRUBIN TOTAL: 0.2 mg/dL (ref 0.0–1.2)
BILIRUBIN, DIRECT: 0.07 mg/dL (ref 0.00–0.40)
TOTAL PROTEIN: 6.9 g/dL (ref 6.0–8.5)

## 2017-03-29 LAB — BMP8+EGFR
BUN/Creatinine Ratio: 24 — ABNORMAL HIGH (ref 9–23)
BUN: 15 mg/dL (ref 6–24)
CO2: 24 mmol/L (ref 18–29)
CREATININE: 0.63 mg/dL (ref 0.57–1.00)
Calcium: 9.5 mg/dL (ref 8.7–10.2)
Chloride: 99 mmol/L (ref 96–106)
GFR calc Af Amer: 118 mL/min/{1.73_m2} (ref >59)
GFR, EST NON AFRICAN AMERICAN: 103 mL/min/{1.73_m2} (ref >59)
GLUCOSE: 92 mg/dL (ref 65–99)
POTASSIUM: 4.6 mmol/L (ref 3.5–5.2)
SODIUM: 137 mmol/L (ref 134–144)

## 2017-03-29 LAB — VITAMIN D 25 HYDROXY (VIT D DEFICIENCY, FRACTURES): Vit D, 25-Hydroxy: 35.8 ng/mL (ref 30.0–100.0)

## 2017-03-30 ENCOUNTER — Other Ambulatory Visit: Payer: Self-pay | Admitting: *Deleted

## 2017-03-30 MED ORDER — ATORVASTATIN CALCIUM 10 MG PO TABS: 10.0000 mg | ORAL_TABLET | Freq: Every day | ORAL | 3 refills | Status: DC

## 2017-05-02 DIAGNOSIS — D2371 Other benign neoplasm of skin of right lower limb, including hip: Secondary | ICD-10-CM | POA: Diagnosis not present

## 2017-05-02 DIAGNOSIS — D225 Melanocytic nevi of trunk: Secondary | ICD-10-CM | POA: Diagnosis not present

## 2017-05-02 DIAGNOSIS — D2272 Melanocytic nevi of left lower limb, including hip: Secondary | ICD-10-CM | POA: Diagnosis not present

## 2017-05-02 DIAGNOSIS — L820 Inflamed seborrheic keratosis: Secondary | ICD-10-CM | POA: Diagnosis not present

## 2017-05-02 DIAGNOSIS — D485 Neoplasm of uncertain behavior of skin: Secondary | ICD-10-CM | POA: Diagnosis not present

## 2017-05-19 ENCOUNTER — Other Ambulatory Visit: Payer: Self-pay | Admitting: Family Medicine

## 2017-05-21 NOTE — Telephone Encounter (Signed)
Last Vit D 03/28/17  DWM   35.8

## 2017-05-25 ENCOUNTER — Other Ambulatory Visit: Payer: Self-pay | Admitting: Family Medicine

## 2017-05-25 DIAGNOSIS — R0981 Nasal congestion: Secondary | ICD-10-CM

## 2017-08-05 ENCOUNTER — Other Ambulatory Visit: Payer: Self-pay | Admitting: Nurse Practitioner

## 2017-08-06 NOTE — Telephone Encounter (Signed)
Last Vit D 03/28/17   35.8

## 2017-09-28 ENCOUNTER — Encounter: Payer: Self-pay | Admitting: Family Medicine

## 2017-09-28 ENCOUNTER — Ambulatory Visit: Payer: BLUE CROSS/BLUE SHIELD | Admitting: Family Medicine

## 2017-09-28 ENCOUNTER — Other Ambulatory Visit: Payer: Self-pay | Admitting: *Deleted

## 2017-09-28 VITALS — BP 111/74 | HR 84 | Temp 98.7°F | Ht 66.0 in | Wt 187.0 lb

## 2017-09-28 DIAGNOSIS — E559 Vitamin D deficiency, unspecified: Secondary | ICD-10-CM | POA: Diagnosis not present

## 2017-09-28 DIAGNOSIS — E7800 Pure hypercholesterolemia, unspecified: Secondary | ICD-10-CM

## 2017-09-28 DIAGNOSIS — M25572 Pain in left ankle and joints of left foot: Secondary | ICD-10-CM | POA: Diagnosis not present

## 2017-09-28 DIAGNOSIS — Z23 Encounter for immunization: Secondary | ICD-10-CM

## 2017-09-28 NOTE — Addendum Note (Signed)
Addended by: Zannie Cove on: 09/28/2017 09:29 AM   Modules accepted: Orders

## 2017-09-28 NOTE — Progress Notes (Signed)
Subjective:    Patient ID: Carla Morales, female    DOB: 10/12/1961, 56 y.o.   MRN: [id number]  HPI Pt here for follow up and management of chronic medical problems which includes hyperlipidemia. She is taking medication regularly.  She is doing well overall.  She had labs done on the outside and will get [place] a copy of these labs for review.  She has started atorvastatin since the last visit with the last lab work we did.  The x-rays done in May of the ankle were negative.  The ankle issue is an aggravating issue.  It is worse when she wears any kind of heels.  She is not reinjured it.  She still has meloxicam at home but does have some problems with reflux occasionally.  We discussed retrying the meloxicam but taking only 1/2 pill daily and while she is taking this after her largest meal each day she will also take her ranitidine twice daily to prevent any GI irritation.  We will also plan to just get some gentle physical therapy for the ankle and coincidence with doing the meloxicam.  She will get back in touch with [place] after 4 weeks if she is not doing better.  She denies any chest pain or shortness of breath.  She denies any trouble with swallowing and has only occasional heartburn and reflux.  She denies any blood in the stool or black tarry bowel movements in her stool habits have not changed.  Her next colonoscopy is not due until 2025.  She is passing her water without problems.  She is due to get an eye exam soon.  There is a family history of hypertension lung cancer and breast cancer.     Patient Active Problem List   Diagnosis Date Noted  . Chest pressure 11/27/2013  . Bronchospasm 11/27/2013  . Allergy   . Hyperlipidemia   . Migraine   . Pinched nerve in neck    Outpatient Encounter Medications as of 09/28/2017  Medication Sig  . albuterol (PROVENTIL HFA) 108 (90 Base) MCG/ACT inhaler Inhale 2 puffs into the lungs every 6 (six) hours as needed for wheezing or shortness of breath.    . ALPRAZolam (XANAX) 0.5 MG tablet Take 1 tablet (0.5 mg total) by mouth at bedtime as needed for anxiety.  Marland Kitchen atorvastatin (LIPITOR) 10 MG tablet Take 1 tablet (10 mg total) by mouth daily.  . calcium carbonate (OSCAL) 1500 (600 CA) MG TABS tablet Take 600 mg of elemental calcium by mouth daily with breakfast.  . cyclobenzaprine (FLEXERIL) 10 MG tablet Take 1 tablet (10 mg total) by mouth at bedtime.  Marland Kitchen estradiol (VIVELLE-DOT) 0.075 MG/24HR Place 1 patch onto the skin 2 (two) times a week.  . fluticasone (FLONASE) 50 MCG/ACT nasal spray USE 2 SPRAYS IN EACH NOSTRIL DAILY  . Omega-3 Krill Oil 500 MG CAPS Take 1 capsule by mouth daily.  . ranitidine (ZANTAC) 150 MG tablet Take 150 mg by mouth at bedtime.  . Vitamin D, Ergocalciferol, (DRISDOL) 50000 units CAPS capsule TAKE ONE CAPSULE BY MOUTH EVERY 7 DAYS GENERIC EQUIVALENT FOR DRISDOL  . Loratadine (CLARITIN PO) Take 1 tablet by mouth daily.   . meloxicam (MOBIC) 15 MG tablet Take 1 tablet (15 mg total) by mouth daily. (Patient not taking: Reported on 09/28/2017)  . Turmeric 500 MG CAPS Take by mouth.  Marland Kitchen UNABLE TO FIND Med Name: apple cider vinegar   No facility-administered encounter medications on file as of 09/28/2017.  Review of Systems  Constitutional: Negative.   HENT: Negative.   Eyes: Negative.   Respiratory: Negative.   Cardiovascular: Negative.   Gastrointestinal: Negative.   Endocrine: Negative.   Genitourinary: Negative.   Musculoskeletal: Positive for arthralgias (left ankle soreness from fall this past spring).  Skin: Negative.   Allergic/Immunologic: Negative.   Neurological: Negative.   Hematological: Negative.   Psychiatric/Behavioral: Negative.        Objective:   Physical Exam  Constitutional: She is oriented to person, place, and time. She appears well-developed and well-nourished. No distress.  Pleasant and alert  HENT:  Head: Normocephalic and atraumatic.  Right Ear: External ear normal.  Left  Ear: External ear normal.  Nose: Nose normal.  Mouth/Throat: Oropharynx is clear and moist. No oropharyngeal exudate.  Nasal congestion and redness and turbinate swelling.  Eyes: Conjunctivae and EOM are normal. Pupils are equal, round, and reactive to light. Right eye exhibits no discharge. Left eye exhibits no discharge. No scleral icterus.  Eye exam is due soon and patient will make sure that we get a copy of this  Neck: Normal range of motion. Neck supple. No thyromegaly present.  No bruits thyromegaly or anterior cervical adenopathy  Cardiovascular: Normal rate, regular rhythm, normal heart sounds and intact distal pulses.  No murmur heard. The heart is regular at 60/min  Pulmonary/Chest: Effort normal and breath sounds normal. No respiratory distress. She has no wheezes. She has no rales.  Clear anteriorly and posteriorly  Abdominal: Soft. Bowel sounds are normal. She exhibits no mass. There is no tenderness. There is no rebound and no guarding.  No liver or spleen enlargement no epigastric or suprapubic tenderness and no bruits  Musculoskeletal: Normal range of motion. She exhibits no edema.  Good movement of left ankle with minimal pain or tenderness  Lymphadenopathy:    She has no cervical adenopathy.  Neurological: She is alert and oriented to person, place, and time. She has normal reflexes. No cranial nerve deficit.  Skin: Skin is warm and dry. No rash noted.  Psychiatric: She has a normal mood and affect. Her behavior is normal. Judgment and thought content normal.  Nursing note and vitals reviewed.  BP 111/74 (BP Location: Right Arm)   Pulse 84   Temp 98.7 F (37.1 C) (Oral)   Ht 5\' 6"  (1.676 m)   Wt 187 lb (84.8 kg)   LMP 07/24/2010   BMI 30.18 kg/m         Assessment & Plan:  1. Pure hypercholesterolemia -Review cholesterol numbers from outside labs.  Continue with atorvastatin and omega-3 fatty acids and consider switching to vacepa depending on review of lab  work  2. Vitamin D deficiency -Continue vitamin D 50,000 units weekly  3. Left ankle pain, unspecified chronicity -Take meloxicam one half of the 15 mg p.o. daily after the largest meal and to protect stomach take ranitidine 150 twice daily before breakfast and supper -Arrange for physical therapy -Patient will call us in 4 weeks if she is not doing better we will do a referral to the orthopedic surgeon  Patient Instructions  Continue current medications. Continue good therapeutic lifestyle changes which include good diet and exercise. Fall precautions discussed with patient. If an FOBT was given today- please return it to our front desk. If you are over 20 years old - you may need Prevnar 10 or the adult Pneumonia vaccine.  **Flu shots are available--- please call and schedule a FLU-CLINIC appointment**  After your visit with  [place] today you will receive a survey in the mail or online from [employer] regarding your care with [place]. Please take a moment to fill this out. Your feedback is very important to [place] as you can help [place] better understand your patient needs as well as improve your experience and satisfaction. WE CARE ABOUT YOU!!!   Bring outside labs by for review Take meloxicam 1/2 pill daily after the largest meal and while taking this take ranitidine or Zantac 150 mg twice daily before breakfast and supper Take the meloxicam for 3-4 weeks and get physical therapy as planned After a 4-week period of time if the ankle pain is persisting, get back in touch and we will set up an appointment with orthopedic surgeon for further follow-up Continue to drink plenty of water and stay well-hydrated Reduce the amount of sugar intake and processed meat as much as possible and eat more fruits and vegetables Use nasal saline frequently through the day and keep the house as cool as possible to prevent nasal passage drying  Arrie Senate MD

## 2017-09-28 NOTE — Patient Instructions (Addendum)
Continue current medications. Continue good therapeutic lifestyle changes which include good diet and exercise. Fall precautions discussed with patient. If an FOBT was given today- please return it to our front desk. If you are over 54 years old - you may need Prevnar 80 or the adult Pneumonia vaccine.  **Flu shots are available--- please call and schedule a FLU-CLINIC appointment**  After your visit with [place] today you will receive a survey in the mail or online from [employer] regarding your care with [place]. Please take a moment to fill this out. Your feedback is very important to [place] as you can help [place] better understand your patient needs as well as improve your experience and satisfaction. WE CARE ABOUT YOU!!!   Bring outside labs by for review Take meloxicam 1/2 pill daily after the largest meal and while taking this take ranitidine or Zantac 150 mg twice daily before breakfast and supper Take the meloxicam for 3-4 weeks and get physical therapy as planned After a 4-week period of time if the ankle pain is persisting, get back in touch and we will set up an appointment with orthopedic surgeon for further follow-up Continue to drink plenty of water and stay well-hydrated Reduce the amount of sugar intake and processed meat as much as possible and eat more fruits and vegetables Use nasal saline frequently through the day and keep the house as cool as possible to prevent nasal passage drying

## 2017-10-09 ENCOUNTER — Ambulatory Visit: Payer: BLUE CROSS/BLUE SHIELD | Attending: Family Medicine | Admitting: Physical Therapy

## 2017-10-09 ENCOUNTER — Encounter: Payer: Self-pay | Admitting: Physical Therapy

## 2017-10-09 DIAGNOSIS — M25672 Stiffness of left ankle, not elsewhere classified: Secondary | ICD-10-CM | POA: Insufficient documentation

## 2017-10-09 DIAGNOSIS — M25572 Pain in left ankle and joints of left foot: Secondary | ICD-10-CM | POA: Insufficient documentation

## 2017-10-09 NOTE — Therapy (Signed)
[facility] [place], [place], [address] Phone: [phone]   Fax:  [phone]  Physical Therapy Evaluation  Patient Details  Name: Carla Morales MRN: [id number] Date of Birth: [birth year] Referring Provider: Redge Gainer, MD   Encounter Date: 10/09/2017  PT End of Session - 10/09/17 0823    Visit Number  1    Number of Visits  12    Date for PT Re-Evaluation  11/20/17    PT Start Time  0823    PT Stop Time  0851    PT Time Calculation (min)  28 min    Activity Tolerance  Patient tolerated treatment well    Behavior During Therapy  [facility] for tasks assessed/performed       Past Medical History:  Diagnosis Date  . Allergy   . Arthritis   . Asthma   . Hyperlipidemia   . Migraine   . Pinched nerve in neck   . Sinus congestion   . Urinary incontinence     Past Surgical History:  Procedure Laterality Date  . bladder dialation  1968   as a child  . TONSILLECTOMY  1971  . TOTAL VAGINAL HYSTERECTOMY  07/2010   menorrhagia, leiomyoma, prolapse  . TUBAL LIGATION  1998    There were no vitals filed for this visit.   Subjective Assessment - 10/09/17 0819    Subjective  Patient fell in May 2018 and sprained her ankle.     Pertinent History  asthma    Patient Stated Goals  to walk without pain    Currently in Pain?  No/denies 2/42 with certain shoes    Pain Location  Ankle    Pain Orientation  Left    Pain Descriptors / Indicators  Nagging;Stabbing    Pain Type  Chronic pain    Pain Radiating Towards  from distal lateral achilles around malleolus to dorsum of ankle    Pain Onset  More than a month ago    Pain Frequency  Intermittent    Aggravating Factors   wearing certain shoes    Pain Relieving Factors  flat shoes, rest    Effect of Pain on Daily Activities  annoying at work and where she needs to wear heels (less than 2 inch)         [facility] PT Assessment - 10/09/17 0001      Assessment   Medical  Diagnosis  Left ankle pain    Referring Provider  Redge Gainer, MD    Onset Date/Surgical Date  03/13/17    Next MD Visit  6 months      Balance Screen   Has the patient fallen in the past 6 months  Yes    How many times?  1    Has the patient had a decrease in activity level because of a fear of falling?   No    Is the patient reluctant to leave their home because of a fear of falling?   No      Home Environment   Living Environment  Private residence    Home Layout  One level      Prior Function   Level of Independence  Independent      Posture/Postural Control   Posture Comments  Bil pes planus L>R      ROM / Strength   AROM / PROM / Strength  AROM;Strength;PROM      AROM   AROM Assessment Site  Ankle  Right/Left Ankle  Left    Left Ankle Dorsiflexion  -10    Left Ankle Plantar Flexion  65    Left Ankle Inversion  30    Left Ankle Eversion  18      PROM   PROM Assessment Site  Ankle    Right/Left Ankle  Left    Left Ankle Dorsiflexion  0    Left Ankle Eversion  28      Strength   Overall Strength Comments  Eversion 4+/5, else 5/5, plantar flexion 5 reps painful      Palpation   Palpation comment  decreased talocrural mobility      Balance   Balance Assessed  Yes SLS 10 sec but unstable on L             Objective measurements completed on examination: See above findings.              PT Education - 10/09/17 0905    Education provided  Yes    Education Details  HEP; advised to not wear heels at this time.    Person(s) Educated  Patient    Methods  Explanation;Demonstration;Handout;Verbal cues    Comprehension  Verbalized understanding;Returned demonstration          PT Long Term Goals - 10/09/17 0900      PT LONG TERM GOAL #1   Title  I with HEP.    Time  4    Period  Weeks    Status  New      PT LONG TERM GOAL #2   Title  Patient to demo active L ankle DF of 5 deg or better to normalize gait.    Time  4    Period  Weeks     Status  New      PT LONG TERM GOAL #3   Title  Patient able to ambulate with pain no greater than 1/10.    Time  4    Period  Weeks    Status  New      PT LONG TERM GOAL #4   Title  Patient to ambulate with normal stance time and toe off on the left.    Time  4    Period  Weeks    Status  New      PT LONG TERM GOAL #5   Title  Patient able to perform 10 single leg heel lifts on the left.    Time  4    Period  Weeks    Status  New             Plan - 10/09/17 0856    Clinical Impression Statement  Patient presents to PT for a low complexity evaluation for Left ankle pain. She has decreased ROM, strength and mobility affecting gait and balance. She will benefit from PT to address these deficits.    Clinical Presentation  Stable    Clinical Decision Making  Low    Rehab Potential  Excellent    PT Frequency  2x / week    PT Duration  4 weeks    PT Treatment/Interventions  ADLs/Self Care Home Management;Cryotherapy;Electrical Stimulation;Moist Heat;Therapeutic exercise;Therapeutic activities;Gait training;Ultrasound;Neuromuscular re-education;Patient/family education;Manual techniques;Dry needling    PT Next Visit Plan  warm up rockerboard, gastroc/soleus stretching, manual to same, talocrural mobs, mob with movement for DF, heel raises/strength, balance, gait.    PT Home Exercise Plan  gastroc/soleus stretch, ankle rom    Consulted and Agree with Plan  of Care  Patient       Patient will benefit from skilled therapeutic intervention in order to improve the following deficits and impairments:  Abnormal gait, Pain, Decreased strength, Decreased range of motion, Impaired flexibility, Decreased balance  Visit Diagnosis: Stiffness of left ankle, not elsewhere classified - Plan: PT plan of care cert/re-cert  Pain in left ankle and joints of left foot - Plan: PT plan of care cert/re-cert     Problem List Patient Active Problem List   Diagnosis Date Noted  . Chest pressure  11/27/2013  . Bronchospasm 11/27/2013  . Allergy   . Hyperlipidemia   . Migraine   . Pinched nerve in neck     Madelyn Flavors PT 10/09/2017, 9:07 AM  [facility] [place], [place], [address] Phone: [phone]   Fax:  [phone]  Name: Carla Morales MRN: [id number] Date of Birth: 1961/10/31

## 2017-10-09 NOTE — Patient Instructions (Signed)
Ankle Alphabet   Using left ankle and foot only, trace the letters of the alphabet. Perform A to Z. Repeat _1___ times per set. Do ____ sets per session. Do __2-3__ sessions per day.  http://orth.exer.us/16   Copyright  VHI. All rights reserved.    Ankle Circles   Slowly rotate right foot and ankle clockwise then counterclockwise. Gradually increase range of motion. Avoid pain. Circle __10__ times each direction per set. Do ____ sets per session. Do 2-3____ sessions per day.  http://orth.exer.us/30   Copyright  VHI. All rights reserved.    ROM: Plantar / Dorsiflexion   With left leg relaxed, gently flex and extend ankle. Move through full range of motion. Avoid pain. Repeat __20__ times per set. Do ____ sets per session. Do __3-4__ sessions per day.  http://orth.exer.us/34   Copyright  VHI. All rights reserved.    ROM: Inversion / Eversion   With left leg relaxed, gently turn ankle and foot in and out. Move through full range of motion. Avoid pain. Repeat _20___ times per set. Do ____ sets per session. Do _3-4___ sessions per day.  http://orth.exer.us/36   Copyright  VHI. All rights reserved.  Gastroc Stretch - Standing    Stand with uninvolved leg straight, heel on floor, toes pointed slightly inward. Lean into wall until stretch is felt in calf. Hold for _60__ seconds. Repeat on involved leg. Repeat _3__ times. Do _3__ times per day.  Copyright  VHI. All rights reserved.   Soleus Stretch - Standing    Stand with uninvolved leg behind, slightly bent, heel on floor. Lean into wall until stretch is felt in calf. Hold for _60__ seconds. Repeat on involved leg. Repeat 3_ times. Do __3_ times per day.  Copyright  VHI. All rights reserved.   Carla Morales, PT 10/09/17 8:51 AM [facility] [address] [place], [place], [address] Phone: [phone]   Fax:  [phone]

## 2017-10-11 ENCOUNTER — Ambulatory Visit: Payer: BLUE CROSS/BLUE SHIELD | Admitting: *Deleted

## 2017-10-11 DIAGNOSIS — M25572 Pain in left ankle and joints of left foot: Secondary | ICD-10-CM | POA: Diagnosis not present

## 2017-10-11 DIAGNOSIS — M25672 Stiffness of left ankle, not elsewhere classified: Secondary | ICD-10-CM

## 2017-10-11 NOTE — Therapy (Signed)
[facility] [place], [place], [address] Phone: [phone]   Fax:  [phone]  Physical Therapy Treatment  Patient Details  Name: ANYRA KAUFMAN MRN: [id number] Date of Birth: [birth year] Referring Provider: Redge Gainer, MD   Encounter Date: 10/11/2017  PT End of Session - 10/11/17 0821    Visit Number  2    Number of Visits  12    Date for PT Re-Evaluation  11/20/17    PT Start Time  0815    PT Stop Time  0907    PT Time Calculation (min)  52 min    Activity Tolerance  Patient tolerated treatment well    Behavior During Therapy  [facility] for tasks assessed/performed       Past Medical History:  Diagnosis Date  . Allergy   . Arthritis   . Asthma   . Hyperlipidemia   . Migraine   . Pinched nerve in neck   . Sinus congestion   . Urinary incontinence     Past Surgical History:  Procedure Laterality Date  . bladder dialation  1968   as a child  . TONSILLECTOMY  1971  . TOTAL VAGINAL HYSTERECTOMY  07/2010   menorrhagia, leiomyoma, prolapse  . TUBAL LIGATION  1998    There were no vitals filed for this visit.  Subjective Assessment - 10/11/17 0818    Subjective  Patient fell in May 2018 and sprained her ankle.  Did a lot of walking yesterday and my LT foot is hurting.    Pertinent History  asthma    Patient Stated Goals  to walk without pain    Currently in Pain?  Yes    Pain Location  Ankle    Pain Orientation  Left    Pain Descriptors / Indicators  Sore    Pain Type  Chronic pain    Pain Onset  More than a month ago    Pain Frequency  Intermittent                      [facility] Adult PT Treatment/Exercise - 10/11/17 0001      Exercises   Exercises  Ankle      Modalities   Modalities  Electrical Stimulation;Vasopneumatic      Electrical Stimulation   Electrical Stimulation Location   LT ankle Premod x 15 mins 80-150hz      Electrical Stimulation Goals  Strength      Vasopneumatic   Number Minutes Vasopneumatic   15 minutes    Vasopnuematic Location   Ankle    Vasopneumatic Pressure  Medium    Vasopneumatic Temperature   36      Ankle Exercises: Aerobic   Stationary Bike  Nustep x 10 mins level 5      Ankle Exercises: Standing   SLS  LT LE on floor    Rocker Board  5 minutes balance and stretching    Heel Raises  20 reps;2 seconds      Ankle Exercises: Seated   Other Seated Ankle Exercises  eversion with yellow Tband 3 x fatigue also given for HEP                  PT Long Term Goals - 10/09/17 0900      PT LONG TERM GOAL #1   Title  I with HEP.    Time  4    Period  Weeks    Status  New  PT LONG TERM GOAL #2   Title  Patient to demo active L ankle DF of 5 deg or better to normalize gait.    Time  4    Period  Weeks    Status  New      PT LONG TERM GOAL #3   Title  Patient able to ambulate with pain no greater than 1/10.    Time  4    Period  Weeks    Status  New      PT LONG TERM GOAL #4   Title  Patient to ambulate with normal stance time and toe off on the left.    Time  4    Period  Weeks    Status  New      PT LONG TERM GOAL #5   Title  Patient able to perform 10 single leg heel lifts on the left.    Time  4    Period  Weeks    Status  New            Plan - 10/11/17 0907    Clinical Impression Statement  Pt arrived today with minimal pain LT ankle, but gets very sore along lateral epicondyle after walking a while. Rx focused on proprioception and strengthening as well as DF ROM. Pt was challenged by circles on dynadisc.Marland Kitchen Normal response to modalities.       Patient will benefit from skilled therapeutic intervention in order to improve the following deficits and impairments:  Abnormal gait, Pain, Decreased strength, Decreased range of motion, Impaired flexibility, Decreased balance  Visit Diagnosis: Pain in left ankle and joints of left foot  Stiffness of left ankle, not elsewhere classified     Problem  List Patient Active Problem List   Diagnosis Date Noted  . Chest pressure 11/27/2013  . Bronchospasm 11/27/2013  . Allergy   . Hyperlipidemia   . Migraine   . Pinched nerve in neck     RAMSEUR,CHRIS , PTA 10/11/2017, 9:14 AM  [facility] [place], [place], [address] Phone: [phone]   Fax:  [phone]  Name: JAHNYLA PARRILLO MRN: [id number] Date of Birth: 09-Sep-1961

## 2017-10-16 ENCOUNTER — Encounter: Payer: Self-pay | Admitting: Physical Therapy

## 2017-10-16 ENCOUNTER — Ambulatory Visit: Payer: BLUE CROSS/BLUE SHIELD | Attending: Family Medicine | Admitting: Physical Therapy

## 2017-10-16 DIAGNOSIS — M25672 Stiffness of left ankle, not elsewhere classified: Secondary | ICD-10-CM

## 2017-10-16 DIAGNOSIS — M25572 Pain in left ankle and joints of left foot: Secondary | ICD-10-CM | POA: Insufficient documentation

## 2017-10-16 NOTE — Therapy (Signed)
[facility] [place], [place], [address] Phone: [phone]   Fax:  [phone]  Physical Therapy Treatment  Patient Details  Name: Carla Morales MRN: [id number] Date of Birth: August 10, 1961 Referring Provider: Redge Gainer, MD   Encounter Date: 10/16/2017  PT End of Session - 10/16/17 0735    Visit Number  3    Number of Visits  12    Date for PT Re-Evaluation  11/20/17    PT Start Time  0732    PT Stop Time  0821    PT Time Calculation (min)  49 min    Activity Tolerance  Patient tolerated treatment well    Behavior During Therapy  [facility] for tasks assessed/performed       Past Medical History:  Diagnosis Date  . Allergy   . Arthritis   . Asthma   . Hyperlipidemia   . Migraine   . Pinched nerve in neck   . Sinus congestion   . Urinary incontinence     Past Surgical History:  Procedure Laterality Date  . bladder dialation  1968   as a child  . TONSILLECTOMY  1971  . TOTAL VAGINAL HYSTERECTOMY  07/2010   menorrhagia, leiomyoma, prolapse  . TUBAL LIGATION  1998    There were no vitals filed for this visit.  Subjective Assessment - 10/16/17 0733    Subjective  Reports that she thinks that exercises is flaring up soreness. Reports that she has experienced a cramping like sensation in her plantar suface of her foot.    Pertinent History  asthma    Patient Stated Goals  to walk without pain    Currently in Pain?  Yes    Pain Score  5     Pain Location  Ankle    Pain Orientation  Left    Pain Descriptors / Indicators  Sore    Pain Type  Chronic pain    Pain Onset  More than a month ago    Pain Frequency  Intermittent    Aggravating Factors   Certain shoes    Pain Relieving Factors  Rest, flat shoes         [facility] PT Assessment - 10/16/17 0001      Assessment   Medical Diagnosis  Left ankle pain    Onset Date/Surgical Date  03/13/17    Next MD Visit  6 months                  [facility] Adult  PT Treatment/Exercise - 10/16/17 0001      Modalities   Modalities  Vasopneumatic      Vasopneumatic   Number Minutes Vasopneumatic   15 minutes    Vasopnuematic Location   Ankle    Vasopneumatic Pressure  Medium    Vasopneumatic Temperature   50      Ankle Exercises: Standing   SLS  LLE SLS on floor x3 min with intermittant UE support    Rocker Board  5 minutes    Heel Raises  Other (comment) x30 reps    Toe Raise  Other (comment) x30 reps      Ankle Exercises: Seated   Other Seated Ankle Exercises  L ankle eversion and inversion yellow theraband x20 reps each      Ankle Exercises: Aerobic   Stationary Bike  Nustep x 11 mins level 5                  PT  Long Term Goals - 10/09/17 0900      PT LONG TERM GOAL #1   Title  I with HEP.    Time  4    Period  Weeks    Status  New      PT LONG TERM GOAL #2   Title  Patient to demo active L ankle DF of 5 deg or better to normalize gait.    Time  4    Period  Weeks    Status  New      PT LONG TERM GOAL #3   Title  Patient able to ambulate with pain no greater than 1/10.    Time  4    Period  Weeks    Status  New      PT LONG TERM GOAL #4   Title  Patient to ambulate with normal stance time and toe off on the left.    Time  4    Period  Weeks    Status  New      PT LONG TERM GOAL #5   Title  Patient able to perform 10 single leg heel lifts on the left.    Time  4    Period  Weeks    Status  New            Plan - 10/16/17 0813    Clinical Impression Statement  Patient tolerated today's treatment well with only reports of intermittant soreness and discomfort in L ankle. Patient guided through ankle strengthening and proprioception exercises with complaint of minimal increasing lateral ankle pain with standing strengthening. Patient provided new red theraband to progress HEP. Normal vasopneumatic response noted following removal of the modalities.    Rehab Potential  Excellent    PT Frequency  2x / week     PT Duration  4 weeks    PT Treatment/Interventions  ADLs/Self Care Home Management;Cryotherapy;Electrical Stimulation;Moist Heat;Therapeutic exercise;Therapeutic activities;Gait training;Ultrasound;Neuromuscular re-education;Patient/family education;Manual techniques;Dry needling    PT Next Visit Plan  warm up rockerboard, gastroc/soleus stretching, manual to same, talocrural mobs, mob with movement for DF, heel raises/strength, balance, gait.    PT Home Exercise Plan  gastroc/soleus stretch, ankle rom    Consulted and Agree with Plan of Care  Patient       Patient will benefit from skilled therapeutic intervention in order to improve the following deficits and impairments:  Abnormal gait, Pain, Decreased strength, Decreased range of motion, Impaired flexibility, Decreased balance  Visit Diagnosis: Pain in left ankle and joints of left foot  Stiffness of left ankle, not elsewhere classified     Problem List Patient Active Problem List   Diagnosis Date Noted  . Chest pressure 11/27/2013  . Bronchospasm 11/27/2013  . Allergy   . Hyperlipidemia   . Migraine   . Pinched nerve in neck     Standley Brooking, PTA 10/16/2017, 8:30 AM  [facility] [place], [place], [address] Phone: [phone]   Fax:  [phone]  Name: Carla Morales MRN: [id number] Date of Birth: 25-Sep-1961

## 2017-10-18 ENCOUNTER — Ambulatory Visit: Payer: BLUE CROSS/BLUE SHIELD | Admitting: *Deleted

## 2017-10-18 DIAGNOSIS — M25572 Pain in left ankle and joints of left foot: Secondary | ICD-10-CM | POA: Diagnosis not present

## 2017-10-18 DIAGNOSIS — M25672 Stiffness of left ankle, not elsewhere classified: Secondary | ICD-10-CM | POA: Diagnosis not present

## 2017-10-18 NOTE — Therapy (Signed)
[facility] [place], [place], [address] Phone: [phone]   Fax:  [phone]  Physical Therapy Treatment  Patient Details  Name: Carla Morales MRN: [id number] Date of Birth: 05/20/1961 Referring Provider: Redge Gainer, MD   Encounter Date: 10/18/2017  PT End of Session - 10/18/17 0839    Visit Number  4    Number of Visits  12    Date for PT Re-Evaluation  11/20/17    PT Start Time  0815    PT Stop Time  0905    PT Time Calculation (min)  50 min       Past Medical History:  Diagnosis Date  . Allergy   . Arthritis   . Asthma   . Hyperlipidemia   . Migraine   . Pinched nerve in neck   . Sinus congestion   . Urinary incontinence     Past Surgical History:  Procedure Laterality Date  . bladder dialation  1968   as a child  . TONSILLECTOMY  1971  . TOTAL VAGINAL HYSTERECTOMY  07/2010   menorrhagia, leiomyoma, prolapse  . TUBAL LIGATION  1998    There were no vitals filed for this visit.  Subjective Assessment - 10/18/17 0835    Subjective  My ankle is doing better, but both calves are very sore.    Pertinent History  asthma    Patient Stated Goals  to walk without pain    Currently in Pain?  Yes    Pain Score  3     Pain Location  Ankle    Pain Orientation  Left    Pain Descriptors / Indicators  Sore    Pain Type  Chronic pain    Pain Onset  More than a month ago    Pain Frequency  Intermittent                      [facility] Adult PT Treatment/Exercise - 10/18/17 0001      Exercises   Exercises  Ankle      Modalities   Modalities  Vasopneumatic      Vasopneumatic   Number Minutes Vasopneumatic   15 minutes    Vasopnuematic Location   Ankle    Vasopneumatic Pressure  Medium    Vasopneumatic Temperature   36      Manual Therapy   Manual Therapy  Passive ROM    Passive ROM  Talocrual mobs to increase DF      Ankle Exercises: Aerobic   Stationary Bike  Nustep x 15 mins level  5      Ankle Exercises: Standing   SLS  LLE SLS on floor and the on balance pad x3 min with intermittant UE support    Rocker Board  5 minutes      Ankle Exercises: Seated   Other Seated Ankle Exercises  L ankle eversion and inversion yellow theraband x20 reps each                  PT Long Term Goals - 10/09/17 0900      PT LONG TERM GOAL #1   Title  I with HEP.    Time  4    Period  Weeks    Status  New      PT LONG TERM GOAL #2   Title  Patient to demo active L ankle DF of 5 deg or better to normalize gait.    Time  4    Period  Weeks    Status  New      PT LONG TERM GOAL #3   Title  Patient able to ambulate with pain no greater than 1/10.    Time  4    Period  Weeks    Status  New      PT LONG TERM GOAL #4   Title  Patient to ambulate with normal stance time and toe off on the left.    Time  4    Period  Weeks    Status  New      PT LONG TERM GOAL #5   Title  Patient able to perform 10 single leg heel lifts on the left.    Time  4    Period  Weeks    Status  New            Plan - 10/18/17 0911    Clinical Impression Statement  Pt arrived today doing better overall with LT ankle with less pain and increased DF ROM to 10 degrees today. Rx focused on ankle strengthening and proprioception as well as ROM.  Normal modality response.    PT Frequency  2x / week    PT Duration  4 weeks    PT Treatment/Interventions  ADLs/Self Care Home Management;Cryotherapy;Electrical Stimulation;Moist Heat;Therapeutic exercise;Therapeutic activities;Gait training;Ultrasound;Neuromuscular re-education;Patient/family education;Manual techniques;Dry needling    PT Next Visit Plan  warm up rockerboard, gastroc/soleus stretching, manual to same, talocrural mobs, mob with movement for DF, heel raises/strength, balance, gait.    PT Home Exercise Plan  gastroc/soleus stretch, ankle rom    Consulted and Agree with Plan of Care  Patient       Patient will benefit from  skilled therapeutic intervention in order to improve the following deficits and impairments:  Abnormal gait, Pain, Decreased strength, Decreased range of motion, Impaired flexibility, Decreased balance  Visit Diagnosis: Pain in left ankle and joints of left foot  Stiffness of left ankle, not elsewhere classified     Problem List Patient Active Problem List   Diagnosis Date Noted  . Chest pressure 11/27/2013  . Bronchospasm 11/27/2013  . Allergy   . Hyperlipidemia   . Migraine   . Pinched nerve in neck     Davaun Quintela,CHRIS, PTA 10/18/2017, 9:23 AM  [facility] [place], [place], [address] Phone: [phone]   Fax:  [phone]  Name: Carla Morales MRN: [id number] Date of Birth: January 22, 1961

## 2017-10-21 ENCOUNTER — Other Ambulatory Visit: Payer: Self-pay | Admitting: Family Medicine

## 2017-10-23 ENCOUNTER — Encounter: Payer: Self-pay | Admitting: Physical Therapy

## 2017-10-23 ENCOUNTER — Ambulatory Visit: Payer: BLUE CROSS/BLUE SHIELD | Admitting: Physical Therapy

## 2017-10-23 DIAGNOSIS — M25672 Stiffness of left ankle, not elsewhere classified: Secondary | ICD-10-CM | POA: Diagnosis not present

## 2017-10-23 DIAGNOSIS — M25572 Pain in left ankle and joints of left foot: Secondary | ICD-10-CM

## 2017-10-23 NOTE — Therapy (Signed)
[facility] [place], [place], [address] Phone: [phone]   Fax:  [phone]  Physical Therapy Treatment  Patient Details  Name: Carla Morales MRN: [id number] Date of Birth: 1961/09/17 Referring Provider: Redge Gainer, MD   Encounter Date: 10/23/2017  PT End of Session - 10/23/17 1650    Visit Number  5    Number of Visits  12    Date for PT Re-Evaluation  11/20/17    PT Start Time  9833    PT Stop Time  1735    PT Time Calculation (min)  48 min    Activity Tolerance  Patient tolerated treatment well    Behavior During Therapy  [facility] for tasks assessed/performed       Past Medical History:  Diagnosis Date  . Allergy   . Arthritis   . Asthma   . Hyperlipidemia   . Migraine   . Pinched nerve in neck   . Sinus congestion   . Urinary incontinence     Past Surgical History:  Procedure Laterality Date  . bladder dialation  1968   as a child  . TONSILLECTOMY  1971  . TOTAL VAGINAL HYSTERECTOMY  07/2010   menorrhagia, leiomyoma, prolapse  . TUBAL LIGATION  1998    There were no vitals filed for this visit.  Subjective Assessment - 10/23/17 1649    Subjective  Reports that she has really felt her ankle in the cold,icy weather. Reports that soreness may be from weather and be from wearing heels to a wedding as well as not doing HEP as instructed.    Pertinent History  asthma    Patient Stated Goals  to walk without pain    Currently in Pain?  Yes    Pain Score  4     Pain Location  Ankle    Pain Orientation  Left    Pain Descriptors / Indicators  Sore    Pain Type  Chronic pain    Pain Onset  More than a month ago         [facility] PT Assessment - 10/23/17 0001      Assessment   Medical Diagnosis  Left ankle pain    Onset Date/Surgical Date  03/13/17    Next MD Visit  6 months                  [facility] Adult PT Treatment/Exercise - 10/23/17 0001      Modalities   Modalities  Electrical  Stimulation;Vasopneumatic      Acupuncturist Stimulation Location  L ankle    Electrical Stimulation Action  Pre-Mod    Electrical Stimulation Parameters  80-150 hz x15 min    Electrical Stimulation Goals  Pain      Vasopneumatic   Number Minutes Vasopneumatic   15 minutes    Vasopnuematic Location   Ankle    Vasopneumatic Pressure  Medium    Vasopneumatic Temperature   51      Ankle Exercises: Aerobic   Stationary Bike  Nustep L5 x10 min      Ankle Exercises: Health and safety inspector  Other (comment) x3 min      Ankle Exercises: Standing   SLS  LLE SLS x3 reps with max hold      Heel Raises  Other (comment) x30 reps    Toe Raise  Other (comment) x30 reps    Other Standing Ankle Exercises  L forward  and lateral step ups x20 reps each       Ankle Exercises: Seated   Other Seated Ankle Exercises  L ankle Inv/Ev with 1# ankle isolator x20 reps each                  PT Long Term Goals - 10/09/17 0900      PT LONG TERM GOAL #1   Title  I with HEP.    Time  4    Period  Weeks    Status  New      PT LONG TERM GOAL #2   Title  Patient to demo active L ankle DF of 5 deg or better to normalize gait.    Time  4    Period  Weeks    Status  New      PT LONG TERM GOAL #3   Title  Patient able to ambulate with pain no greater than 1/10.    Time  4    Period  Weeks    Status  New      PT LONG TERM GOAL #4   Title  Patient to ambulate with normal stance time and toe off on the left.    Time  4    Period  Weeks    Status  New      PT LONG TERM GOAL #5   Title  Patient able to perform 10 single leg heel lifts on the left.    Time  4    Period  Weeks    Status  New            Plan - 10/23/17 1727    Clinical Impression Statement  Patient tolerated today's treatment well although she arrived with increased L ankle soreness due to busy weekend, wearing high heels, and cold weather. Patient able to tolerate standing exercises  without complaint and able to complete exercises with longer holds. Minimal lateral malleoli inflammation noted upon observation of L ankle. Normal modalities response noted following removal of the modalities.    Rehab Potential  Excellent    PT Frequency  2x / week    PT Duration  4 weeks    PT Treatment/Interventions  ADLs/Self Care Home Management;Cryotherapy;Electrical Stimulation;Moist Heat;Therapeutic exercise;Therapeutic activities;Gait training;Ultrasound;Neuromuscular re-education;Patient/family education;Manual techniques;Dry needling    PT Next Visit Plan  warm up rockerboard, gastroc/soleus stretching, manual to same, talocrural mobs, mob with movement for DF, heel raises/strength, balance, gait.    PT Home Exercise Plan  gastroc/soleus stretch, ankle rom    Consulted and Agree with Plan of Care  Patient       Patient will benefit from skilled therapeutic intervention in order to improve the following deficits and impairments:  Abnormal gait, Pain, Decreased strength, Decreased range of motion, Impaired flexibility, Decreased balance  Visit Diagnosis: Pain in left ankle and joints of left foot  Stiffness of left ankle, not elsewhere classified     Problem List Patient Active Problem List   Diagnosis Date Noted  . Chest pressure 11/27/2013  . Bronchospasm 11/27/2013  . Allergy   . Hyperlipidemia   . Migraine   . Pinched nerve in neck     Standley Brooking, PTA 10/23/2017, 5:41 PM  [facility] [address] [place], [place], [address] Phone: [phone]   Fax:  [phone]  Name: Carla Morales MRN: [id number] Date of Birth: 1961/05/23

## 2017-10-25 ENCOUNTER — Encounter: Payer: Self-pay | Admitting: Physical Therapy

## 2017-10-25 ENCOUNTER — Ambulatory Visit: Payer: BLUE CROSS/BLUE SHIELD | Admitting: Physical Therapy

## 2017-10-25 DIAGNOSIS — M25572 Pain in left ankle and joints of left foot: Secondary | ICD-10-CM | POA: Diagnosis not present

## 2017-10-25 DIAGNOSIS — M25672 Stiffness of left ankle, not elsewhere classified: Secondary | ICD-10-CM

## 2017-10-25 NOTE — Therapy (Signed)
[facility] [place], [place], [address] Phone: [phone]   Fax:  [phone]  Physical Therapy Treatment  Patient Details  Name: Carla Morales MRN: [id number] Date of Birth: [birth year] Referring Provider: Redge Gainer, MD   Encounter Date: 10/25/2017  PT End of Session - 10/25/17 1653    Visit Number  6    Number of Visits  12    Date for PT Re-Evaluation  11/20/17    PT Start Time  6283    PT Stop Time  1742    PT Time Calculation (min)  55 min    Activity Tolerance  Patient tolerated treatment well    Behavior During Therapy  [facility] for tasks assessed/performed       Past Medical History:  Diagnosis Date  . Allergy   . Arthritis   . Asthma   . Hyperlipidemia   . Migraine   . Pinched nerve in neck   . Sinus congestion   . Urinary incontinence     Past Surgical History:  Procedure Laterality Date  . bladder dialation  1968   as a child  . TONSILLECTOMY  1971  . TOTAL VAGINAL HYSTERECTOMY  07/2010   menorrhagia, leiomyoma, prolapse  . TUBAL LIGATION  1998    There were no vitals filed for this visit.  Subjective Assessment - 10/25/17 1649    Subjective  Reports that her ankle has been good today and got her ankle to pop good yesterday without pain. Patient reports some soreness following last PT treatment.     Pertinent History  asthma    Patient Stated Goals  to walk without pain    Currently in Pain?  No/denies         [facility] PT Assessment - 10/25/17 0001      Assessment   Medical Diagnosis  Left ankle pain    Onset Date/Surgical Date  03/13/17    Next MD Visit  6 months                  [facility] Adult PT Treatment/Exercise - 10/25/17 0001      Vasopneumatic   Number Minutes Vasopneumatic   15 minutes    Vasopnuematic Location   Ankle    Vasopneumatic Pressure  Medium    Vasopneumatic Temperature   34      Ankle Exercises: Aerobic   Stationary Bike  Nustep L5 x10 min      Ankle Exercises: Health and safety inspector  Other (comment) x4 min      Ankle Exercises: Standing   SLS  LLE SLS x3 reps with max hold      Heel Raises  Other (comment);20 reps 3D heel raises on floor    Toe Raise  20 reps    Other Standing Ankle Exercises  L forward and lateral step ups x20 reps each     Other Standing Ankle Exercises  Tandem stance on floor x2 min      Ankle Exercises: Seated   ABC's  1 rep;Other (comment) 1# ankle isolator     Other Seated Ankle Exercises  L ankle Inv/Ev with 1# ankle isolator x20 reps each                  PT Long Term Goals - 10/09/17 0900      PT LONG TERM GOAL #1   Title  I with HEP.    Time  4    Period  Weeks    Status  New      PT LONG TERM GOAL #2   Title  Patient to demo active L ankle DF of 5 deg or better to normalize gait.    Time  4    Period  Weeks    Status  New      PT LONG TERM GOAL #3   Title  Patient able to ambulate with pain no greater than 1/10.    Time  4    Period  Weeks    Status  New      PT LONG TERM GOAL #4   Title  Patient to ambulate with normal stance time and toe off on the left.    Time  4    Period  Weeks    Status  New      PT LONG TERM GOAL #5   Title  Patient able to perform 10 single leg heel lifts on the left.    Time  4    Period  Weeks    Status  New            Plan - 10/25/17 1757    Clinical Impression Statement  Patient tolerated today's treatment well and had no pain complaints prior to treatment or during treatment. Patient able to tolerate advanced strengthening exercises. Very minimal instability noted with balance activities on floor and fatigued with SLS. Normal vasopnuematic response noted following removal of the modalites.     Rehab Potential  Excellent    PT Frequency  2x / week    PT Duration  4 weeks    PT Treatment/Interventions  ADLs/Self Care Home Management;Cryotherapy;Electrical Stimulation;Moist Heat;Therapeutic exercise;Therapeutic  activities;Gait training;Ultrasound;Neuromuscular re-education;Patient/family education;Manual techniques;Dry needling    PT Next Visit Plan  warm up rockerboard, gastroc/soleus stretching, manual to same, talocrural mobs, mob with movement for DF, heel raises/strength, balance, gait.    PT Home Exercise Plan  gastroc/soleus stretch, ankle rom    Consulted and Agree with Plan of Care  Patient       Patient will benefit from skilled therapeutic intervention in order to improve the following deficits and impairments:  Abnormal gait, Pain, Decreased strength, Decreased range of motion, Impaired flexibility, Decreased balance  Visit Diagnosis: Pain in left ankle and joints of left foot  Stiffness of left ankle, not elsewhere classified     Problem List Patient Active Problem List   Diagnosis Date Noted  . Chest pressure 11/27/2013  . Bronchospasm 11/27/2013  . Allergy   . Hyperlipidemia   . Migraine   . Pinched nerve in neck     Standley Brooking, PTA 10/25/2017, 6:03 PM  [facility] [place], [place], [address] Phone: [phone]   Fax:  [phone]  Name: Carla Morales MRN: [id number] Date of Birth: May 19, 1961

## 2017-11-01 ENCOUNTER — Encounter: Payer: Self-pay | Admitting: Physical Therapy

## 2017-11-01 ENCOUNTER — Ambulatory Visit: Payer: BLUE CROSS/BLUE SHIELD | Admitting: Physical Therapy

## 2017-11-01 DIAGNOSIS — M25572 Pain in left ankle and joints of left foot: Secondary | ICD-10-CM | POA: Diagnosis not present

## 2017-11-01 DIAGNOSIS — M25672 Stiffness of left ankle, not elsewhere classified: Secondary | ICD-10-CM

## 2017-11-01 NOTE — Therapy (Addendum)
[facility] [place], [place], [address] Phone: [phone]   Fax:  [phone]  Physical Therapy Treatment and Discharge Summary  Patient Details  Name: Carla Morales MRN: [id number] Date of Birth: Jan 23, 1961 Referring Provider: Redge Gainer, MD   Encounter Date: 11/01/2017  PT End of Session - 11/01/17 1648    Visit Number  7    Number of Visits  12    Date for PT Re-Evaluation  11/20/17    PT Start Time  9983    PT Stop Time  1655    PT Time Calculation (min)  50 min    Activity Tolerance  Patient tolerated treatment well    Behavior During Therapy  [facility] for tasks assessed/performed       Past Medical History:  Diagnosis Date  . Allergy   . Arthritis   . Asthma   . Hyperlipidemia   . Migraine   . Pinched nerve in neck   . Sinus congestion   . Urinary incontinence     Past Surgical History:  Procedure Laterality Date  . bladder dialation  1968   as a child  . TONSILLECTOMY  1971  . TOTAL VAGINAL HYSTERECTOMY  07/2010   menorrhagia, leiomyoma, prolapse  . TUBAL LIGATION  1998    There were no vitals filed for this visit.  Subjective Assessment - 11/01/17 1647    Subjective  Reports that she has been experiencing a lot of soreness and reports with activity pain can increase to 5/10.    Pertinent History  asthma    Patient Stated Goals  to walk without pain    Currently in Pain?  No/denies         [facility] PT Assessment - 11/01/17 0001      Assessment   Medical Diagnosis  Left ankle pain    Onset Date/Surgical Date  03/13/17    Next MD Visit  6 months                  [facility] Adult PT Treatment/Exercise - 11/01/17 0001      Vasopneumatic   Number Minutes Vasopneumatic   15 minutes    Vasopnuematic Location   Ankle    Vasopneumatic Pressure  Medium    Vasopneumatic Temperature   34      Ankle Exercises: Aerobic   Stationary Bike  Nustep L6 x10 min      Ankle Exercises: Standing    SLS  LLE SLS x3 reps with max hold      Rocker Board  4 minutes    Heel Raises  20 reps;Other (comment) 3D on floor    Toe Raise  20 reps    Other Standing Ankle Exercises  L lateral step up x20 reps 6"    Other Standing Ankle Exercises  L forward and lateral lunge x20 reps      Ankle Exercises: Seated   Towel Inversion/Eversion  Other (comment) red theraband x20 reps                  PT Long Term Goals - 10/09/17 0900      PT LONG TERM GOAL #1   Title  I with HEP.    Time  4    Period  Weeks    Status  New      PT LONG TERM GOAL #2   Title  Patient to demo active L ankle DF of 5 deg or better to normalize gait.  Time  4    Period  Weeks    Status  New      PT LONG TERM GOAL #3   Title  Patient able to ambulate with pain no greater than 1/10.    Time  4    Period  Weeks    Status  New      PT LONG TERM GOAL #4   Title  Patient to ambulate with normal stance time and toe off on the left.    Time  4    Period  Weeks    Status  New      PT LONG TERM GOAL #5   Title  Patient able to perform 10 single leg heel lifts on the left.    Time  4    Period  Weeks    Status  New            Plan - 11/01/17 1648    Clinical Impression Statement  Patient tolerated today's treatment well although she has recently been experiencing greater soreness recently. Patient guided through both forward and lateral lunges for ankle ROM and patient reported more posteriolateral ankle pain. Normal L ankle strategy noted with SLS on floor with fatigue reported by patient following SLS. Normal modalities response noted following removal of the modalities.    Rehab Potential  Excellent    PT Frequency  2x / week    PT Duration  4 weeks    PT Treatment/Interventions  ADLs/Self Care Home Management;Cryotherapy;Electrical Stimulation;Moist Heat;Therapeutic exercise;Therapeutic activities;Gait training;Ultrasound;Neuromuscular re-education;Patient/family education;Manual  techniques;Dry needling    PT Next Visit Plan  warm up rockerboard, gastroc/soleus stretching, manual to same, talocrural mobs, mob with movement for DF, heel raises/strength, balance, gait.    PT Home Exercise Plan  gastroc/soleus stretch, ankle rom    Consulted and Agree with Plan of Care  Patient       Patient will benefit from skilled therapeutic intervention in order to improve the following deficits and impairments:  Abnormal gait, Pain, Decreased strength, Decreased range of motion, Impaired flexibility, Decreased balance  Visit Diagnosis: Pain in left ankle and joints of left foot  Stiffness of left ankle, not elsewhere classified     Problem List Patient Active Problem List   Diagnosis Date Noted  . Chest pressure 11/27/2013  . Bronchospasm 11/27/2013  . Allergy   . Hyperlipidemia   . Migraine   . Pinched nerve in neck     Standley Brooking, PTA 11/01/2017, 6:13 PM  [facility] [place], [place], [address] Phone: [phone]   Fax:  [phone]  Name: Carla Morales MRN: [id number] Date of Birth: 23-Jun-1961  PHYSICAL THERAPY DISCHARGE SUMMARY  Visits from Start of Care: 7  Current functional level related to goals / functional outcomes: unknown   Remaining deficits: unknown   Education / Equipment: HEP Plan: Patient agrees to discharge.  Patient goals were not met. Patient is being discharged due to not returning since the last visit.  ?????     Madelyn Flavors, PT 08/24/20 1:40 PM [facility] [place], [place], [address] Phone: [phone]   Fax:  [phone]

## 2018-01-02 ENCOUNTER — Ambulatory Visit: Payer: BLUE CROSS/BLUE SHIELD | Admitting: Pediatrics

## 2018-01-02 ENCOUNTER — Encounter: Payer: Self-pay | Admitting: Pediatrics

## 2018-01-02 VITALS — BP 132/81 | HR 66 | Temp 97.2°F | Ht 66.0 in | Wt 188.6 lb

## 2018-01-02 DIAGNOSIS — N3090 Cystitis, unspecified without hematuria: Secondary | ICD-10-CM

## 2018-01-02 DIAGNOSIS — B379 Candidiasis, unspecified: Secondary | ICD-10-CM

## 2018-01-02 DIAGNOSIS — R399 Unspecified symptoms and signs involving the genitourinary system: Secondary | ICD-10-CM | POA: Diagnosis not present

## 2018-01-02 DIAGNOSIS — J069 Acute upper respiratory infection, unspecified: Secondary | ICD-10-CM | POA: Diagnosis not present

## 2018-01-02 LAB — URINALYSIS, COMPLETE
Bilirubin, UA: NEGATIVE
GLUCOSE, UA: NEGATIVE
Ketones, UA: NEGATIVE
Nitrite, UA: NEGATIVE
PH UA: 7 (ref 5.0–7.5)
Protein, UA: NEGATIVE
Specific Gravity, UA: 1.015 (ref 1.005–1.030)
UUROB: 0.2 mg/dL (ref 0.2–1.0)

## 2018-01-02 LAB — MICROSCOPIC EXAMINATION: WBC, UA: >30 /hpf — AB (ref 0–?)

## 2018-01-02 MED ORDER — NITROFURANTOIN MONOHYD MACRO 100 MG PO CAPS: 100.0000 mg | ORAL_CAPSULE | Freq: Two times a day (BID) | ORAL | 0 refills | Status: AC

## 2018-01-02 MED ORDER — FLUCONAZOLE 150 MG PO TABS: 150.0000 mg | ORAL_TABLET | ORAL | 0 refills | Status: DC | PRN

## 2018-01-02 NOTE — Addendum Note (Signed)
Addended by: Pollyann Kennedy F on: 01/02/2018 03:59 PM   Modules accepted: Orders

## 2018-01-02 NOTE — Progress Notes (Signed)
  Subjective:   Patient ID: Carla Morales, female    DOB: 12-23-1960, 57 y.o.   MRN: [id number] CC: Dysuria  HPI: Carla Morales is a 57 y.o. female presenting for Dysuria  Started with symptoms yesterday afternoon, burning, dysuria, frequency, urgency. Symptoms still this morning. Slightly better now. No fevers. No recent UTIs, says years ago was followed in [place] for frequent UTIs. Has been several years since last.  Last week with nausea, vomiting, diarrhea that lasted a couple of days. Symptoms resolved now. 2-3 days ago started getting more nasal congestion, runny nose. No fevers. Appetite has been down since stomach bug last week. Drinking lots of fluids.  No chills. No back or stomach pain now. No coughing. Ears feel pressure at times. Using flonase regularly.  Relevant past medical, surgical, family and social history reviewed. Allergies and medications reviewed and updated. Social History   Tobacco Use  Smoking Status Never Smoker  Smokeless Tobacco Never Used   ROS: Per HPI   Objective:    BP 132/81   Pulse 66   Temp (!) 97.2 F (36.2 C) (Oral)   Ht 5\' 6"  (1.676 m)   Wt 188 lb 9.6 oz (85.5 kg)   LMP 07/24/2010   BMI 30.44 kg/m   Wt Readings from Last 3 Encounters:  01/02/18 188 lb 9.6 oz (85.5 kg)  09/28/17 187 lb (84.8 kg)  03/28/17 183 lb (83 kg)    Gen: NAD, alert, cooperative with exam, NCAT EYES: EOMI, no conjunctival injection, or no icterus ENT:  TMs pearly gray b/l with small amount clear effusion, OP with erythema LYMPH: no cervical LAD CV: NRRR, normal S1/S2, no murmur, distal pulses 2+ b/l Resp: CTABL, no wheezes, normal WOB Abd: +BS, soft, NTND. no guarding or organomegaly, no CVA tenderness Ext: No edema, warm Neuro: Alert and oriented, strength equal b/l UE and LE, coordination grossly normal MSK: normal muscle bulk  Assessment & Plan:  [name] was seen today for dysuria.  Diagnoses and all orders for this visit:  Cystitis +UA with WBC and  RBC, will f/u culture, start below. Return precautions discussed. -     nitrofurantoin, macrocrystal-monohydrate, (MACROBID) 100 MG capsule; Take 1 capsule (100 mg total) by mouth 2 (two) times daily for 5 days.  UTI symptoms -     Urinalysis, Complete -     Urine Culture; Future  Yeast infection Gets yeast infections with abx, use below if needed. -     fluconazole (DIFLUCAN) 150 MG tablet; Take 1 tablet (150 mg total) by mouth every 3 (three) days as needed.  Acute URI Symptom care discussed.  Follow up plan: Return if symptoms worsen or fail to improve. Assunta Found, MD [place]

## 2018-01-02 NOTE — Patient Instructions (Signed)

## 2018-01-04 LAB — URINE CULTURE

## 2018-01-07 ENCOUNTER — Other Ambulatory Visit: Payer: Self-pay | Admitting: Family Medicine

## 2018-02-12 ENCOUNTER — Other Ambulatory Visit: Payer: Self-pay | Admitting: Gynecology

## 2018-02-12 DIAGNOSIS — Z1231 Encounter for screening mammogram for malignant neoplasm of breast: Secondary | ICD-10-CM

## 2018-03-04 ENCOUNTER — Ambulatory Visit
Admission: RE | Admit: 2018-03-04 | Discharge: 2018-03-04 | Disposition: A | Discharge status: Home / Self Care | Payer: BLUE CROSS/BLUE SHIELD | Source: Ambulatory Visit | Attending: Gynecology | Admitting: Gynecology

## 2018-03-04 DIAGNOSIS — Z1231 Encounter for screening mammogram for malignant neoplasm of breast: Secondary | ICD-10-CM

## 2018-03-15 ENCOUNTER — Other Ambulatory Visit: Payer: Self-pay | Admitting: Family Medicine

## 2018-03-15 NOTE — Telephone Encounter (Signed)
OV 03/29/18

## 2018-03-24 ENCOUNTER — Other Ambulatory Visit: Payer: Self-pay | Admitting: Family Medicine

## 2018-03-29 ENCOUNTER — Encounter: Payer: Self-pay | Admitting: Family Medicine

## 2018-03-29 ENCOUNTER — Ambulatory Visit: Payer: BLUE CROSS/BLUE SHIELD | Admitting: Family Medicine

## 2018-03-29 VITALS — BP 108/64 | HR 73 | Temp 98.7°F | Ht 66.0 in | Wt 187.0 lb

## 2018-03-29 DIAGNOSIS — Z801 Family history of malignant neoplasm of trachea, bronchus and lung: Secondary | ICD-10-CM | POA: Diagnosis not present

## 2018-03-29 DIAGNOSIS — E559 Vitamin D deficiency, unspecified: Secondary | ICD-10-CM | POA: Diagnosis not present

## 2018-03-29 DIAGNOSIS — E7800 Pure hypercholesterolemia, unspecified: Secondary | ICD-10-CM | POA: Diagnosis not present

## 2018-03-29 DIAGNOSIS — E669 Obesity, unspecified: Secondary | ICD-10-CM | POA: Insufficient documentation

## 2018-03-29 DIAGNOSIS — Z6830 Body mass index (BMI) 30.0-30.9, adult: Secondary | ICD-10-CM

## 2018-03-29 DIAGNOSIS — E663 Overweight: Secondary | ICD-10-CM | POA: Insufficient documentation

## 2018-03-29 DIAGNOSIS — Z803 Family history of malignant neoplasm of breast: Secondary | ICD-10-CM | POA: Diagnosis not present

## 2018-03-29 NOTE — Progress Notes (Signed)
 Subjective:    Patient ID: Carla Morales, female    DOB: 01/05/1961, 57 y.o.   MRN: [id number]  HPI Pt here for follow up and management of chronic medical problems which includes hyperlipidemia. She is taking medication regularly.  She is doing well overall.  She is due to return in FOBT and get lab work today.  The patient today is doing well overall and denies any specific problems.  She only uses her inhaler when she gets a cold cough or congestion.  She usually uses it regularly during that time and she has more trouble with inhalant allergies in the spring and fall and takes Flonase regularly.  She denies any chest pain pressure tightness shortness of breath.  She does have occasional trouble swallowing and understands that meloxicam can aggravate this and she understands also that it would be wise to take ranitidine before a meal and take meloxicam after meal when she has to take the meloxicam.  She denies any blood in the stool black tarry bowel movements or change in bowel habits.  There is no family history of colon cancer.  She is passing her water without problems and is due to get her pelvic exam in June.  She is up-to-date on her eye exams.     Patient Active Problem List   Diagnosis Date Noted  . Chest pressure 11/27/2013  . Bronchospasm 11/27/2013  . Allergy   . Hyperlipidemia   . Migraine   . Pinched nerve in neck    Outpatient Encounter Medications as of 03/29/2018  Medication Sig  . albuterol (PROVENTIL HFA) 108 (90 Base) MCG/ACT inhaler Inhale 2 puffs into the lungs every 6 (six) hours as needed for wheezing or shortness of breath.  . ALPRAZolam (XANAX) 0.5 MG tablet Take 1 tablet (0.5 mg total) by mouth at bedtime as needed for anxiety.  . atorvastatin (LIPITOR) 10 MG tablet TAKE 1 TABLET BY MOUTH DAILY  . calcium carbonate (OSCAL) 1500 (600 CA) MG TABS tablet Take 600 mg of elemental calcium by mouth daily with breakfast.  . cyclobenzaprine (FLEXERIL) 10 MG tablet  Take 1 tablet (10 mg total) by mouth at bedtime.  . estradiol (VIVELLE-DOT) 0.075 MG/24HR Place 1 patch onto the skin 2 (two) times a week.  . fluconazole (DIFLUCAN) 150 MG tablet Take 1 tablet (150 mg total) by mouth every 3 (three) days as needed.  . fluticasone (FLONASE) 50 MCG/ACT nasal spray USE 2 SPRAYS IN EACH NOSTRIL DAILY  . Loratadine (CLARITIN PO) Take 1 tablet by mouth daily.   . meloxicam (MOBIC) 15 MG tablet Take 1 tablet (15 mg total) by mouth daily.  . Omega-3 Krill Oil 500 MG CAPS Take 1 capsule by mouth daily.  . ranitidine (ZANTAC) 150 MG tablet Take 150 mg by mouth at bedtime.  . Turmeric 500 MG CAPS Take by mouth.  . UNABLE TO FIND Med Name: apple cider vinegar  . Vitamin D, Ergocalciferol, (DRISDOL) 50000 units CAPS capsule TAKE 1 CAPSULE BY MOUTH EVERY 7 DAYS GENERIC EQUIVALENT FOR DRISDOL   No facility-administered encounter medications on file as of 03/29/2018.      Review of Systems  Constitutional: Negative.   HENT: Negative.   Eyes: Negative.   Respiratory: Negative.   Cardiovascular: Negative.   Gastrointestinal: Negative.   Endocrine: Negative.   Genitourinary: Negative.   Musculoskeletal: Negative.   Skin: Negative.   Allergic/Immunologic: Negative.   Neurological: Negative.   Hematological: Negative.   Psychiatric/Behavioral: Negative.          Objective:   Physical Exam  Constitutional: She is oriented to person, place, and time. She appears well-developed and well-nourished.  Patient is pleasant calm and alert  HENT:  Head: Normocephalic and atraumatic.  Right Ear: External ear normal.  Left Ear: External ear normal.  Mouth/Throat: Oropharynx is clear and moist.  Nasal turbinate congestion worse on the right and left  Eyes: Pupils are equal, round, and reactive to light. Conjunctivae and EOM are normal. Right eye exhibits no discharge. Left eye exhibits no discharge.  Neck: Normal range of motion. Neck supple. No thyromegaly present.  No  bruits thyromegaly or anterior cervical adenopathy  Cardiovascular: Normal rate, regular rhythm, normal heart sounds and intact distal pulses.  No murmur heard. The heart is regular at 84/min  Pulmonary/Chest: Effort normal and breath sounds normal. She has no wheezes. She has no rales.  Minimal congestion with coughing but no wheezes or rales.  Lungs were otherwise clear.  Abdominal: Soft. Bowel sounds are normal. She exhibits no mass. There is tenderness. There is no rebound and no guarding.  Light epigastric tenderness with no liver or spleen enlargement masses or inguinal adenopathy  Musculoskeletal: Normal range of motion. She exhibits no edema.  Lymphadenopathy:    She has no cervical adenopathy.  Neurological: She is alert and oriented to person, place, and time. She has normal reflexes. No cranial nerve deficit.  Skin: Skin is warm and dry. No rash noted.  Psychiatric: She has a normal mood and affect. Her behavior is normal. Judgment and thought content normal.  Mood affect and behavior all normal  Nursing note and vitals reviewed.  BP 108/64 (BP Location: Right Arm)   Pulse 73   Temp 98.7 F (37.1 C) (Oral)   Ht 5' 6" (1.676 m)   Wt 187 lb (84.8 kg)   LMP 07/24/2010   BMI 30.18 kg/m         Assessment & Plan:  1. Pure hypercholesterolemia -Continue with atorvastatin and aggressive therapeutic lifestyle changes including diet and exercise pending results of lab work - BMP8+EGFR - CBC with Differential/Platelet - Lipid panel - Hepatic function panel  2. Vitamin D deficiency -Continue with vitamin D replacement pending results of lab work - CBC with Differential/Platelet - VITAMIN D 25 Hydroxy (Vit-D Deficiency, Fractures)  3. BMI 30.0-30.9,adult -Continue to work aggressively on weight loss through diet exercise and diminished carb and sugar intake  4. Family history of breast cancer -Get mammogram regularly  5. Family history of lung cancer -Up-to-date on  chest x-rays  Patient Instructions  Continue current medications. Continue good therapeutic lifestyle changes which include good diet and exercise. Fall precautions discussed with patient. If an FOBT was given today- please return it to our front desk. If you are over 50 years old - you may need Prevnar 13 or the adult Pneumonia vaccine.  **Flu shots are available--- please call and schedule a FLU-CLINIC appointment**  After your visit with us today you will receive a survey in the mail or online from [employer] regarding your care with us. Please take a moment to fill this out. Your feedback is very important to us as you can help us better understand your patient needs as well as improve your experience and satisfaction. WE CARE ABOUT YOU!!!   May want to consider taking ranitidine more regularly Get pelvic exam as planned and make sure we get a copy of the report Stay on top of your colonoscopies and get these every 10 years and   return the FOBT that we were giving you today. Work on reducing weight through diet and exercise and getting a lower BMI closer to 25  Arrie Senate MD

## 2018-03-29 NOTE — Patient Instructions (Addendum)
Continue current medications. Continue good therapeutic lifestyle changes which include good diet and exercise. Fall precautions discussed with patient. If an FOBT was given today- please return it to our front desk. If you are over 55 years old - you may need Prevnar 54 or the adult Pneumonia vaccine.  **Flu shots are available--- please call and schedule a FLU-CLINIC appointment**  After your visit with [place] today you will receive a survey in the mail or online from [employer] regarding your care with [place]. Please take a moment to fill this out. Your feedback is very important to [place] as you can help [place] better understand your patient needs as well as improve your experience and satisfaction. WE CARE ABOUT YOU!!!   May want to consider taking ranitidine more regularly Get pelvic exam as planned and make sure we get a copy of the report Stay on top of your colonoscopies and get these every 10 years and return the FOBT that we were giving you today. Work on reducing weight through diet and exercise and getting a lower BMI closer to 25

## 2018-03-30 LAB — CBC WITH DIFFERENTIAL/PLATELET
Basophils Absolute: 0 x10E3/uL (ref 0.0–0.2)
Basos: 0 %
EOS (ABSOLUTE): 0.2 x10E3/uL (ref 0.0–0.4)
Eos: 3 %
Hematocrit: 39 % (ref 34.0–46.6)
Hemoglobin: 13 g/dL (ref 11.1–15.9)
Immature Grans (Abs): 0 x10E3/uL (ref 0.0–0.1)
Immature Granulocytes: 0 %
Lymphocytes Absolute: 2.5 x10E3/uL (ref 0.7–3.1)
Lymphs: 34 %
MCH: 30.5 pg (ref 26.6–33.0)
MCHC: 33.3 g/dL (ref 31.5–35.7)
MCV: 92 fL (ref 79–97)
Monocytes Absolute: 0.7 x10E3/uL (ref 0.1–0.9)
Monocytes: 9 %
Neutrophils Absolute: 4.1 x10E3/uL (ref 1.4–7.0)
Neutrophils: 54 %
Platelets: 270 x10E3/uL (ref 150–379)
RBC: 4.26 x10E6/uL (ref 3.77–5.28)
RDW: 13.9 % (ref 12.3–15.4)
WBC: 7.5 x10E3/uL (ref 3.4–10.8)

## 2018-03-30 LAB — BMP8+EGFR
BUN/Creatinine Ratio: 19 (ref 9–23)
BUN: 16 mg/dL (ref 6–24)
CHLORIDE: 104 mmol/L (ref 96–106)
CO2: 23 mmol/L (ref 20–29)
Calcium: 9.9 mg/dL (ref 8.7–10.2)
Creatinine, Ser: 0.83 mg/dL (ref 0.57–1.00)
GFR calc Af Amer: 92 mL/min/{1.73_m2} (ref >59)
GFR calc non Af Amer: 80 mL/min/{1.73_m2} (ref >59)
GLUCOSE: 94 mg/dL (ref 65–99)
Potassium: 4.3 mmol/L (ref 3.5–5.2)
Sodium: 141 mmol/L (ref 134–144)

## 2018-03-30 LAB — HEPATIC FUNCTION PANEL
ALT: 21 IU/L (ref 0–32)
AST: 18 IU/L (ref 0–40)
Albumin: 4.3 g/dL (ref 3.5–5.5)
Alkaline Phosphatase: 125 IU/L — ABNORMAL HIGH (ref 39–117)
Bilirubin Total: 0.4 mg/dL (ref 0.0–1.2)
Bilirubin, Direct: 0.1 mg/dL (ref 0.00–0.40)
Total Protein: 7.3 g/dL (ref 6.0–8.5)

## 2018-03-30 LAB — LIPID PANEL
CHOLESTEROL TOTAL: 158 mg/dL (ref 100–199)
Chol/HDL Ratio: 3.4 ratio (ref 0.0–4.4)
HDL: 47 mg/dL (ref >39)
LDL Calculated: 79 mg/dL (ref 0–99)
TRIGLYCERIDES: 162 mg/dL — AB (ref 0–149)
VLDL Cholesterol Cal: 32 mg/dL (ref 5–40)

## 2018-03-30 LAB — VITAMIN D 25 HYDROXY (VIT D DEFICIENCY, FRACTURES): Vit D, 25-Hydroxy: 53.7 ng/mL (ref 30.0–100.0)

## 2018-04-01 ENCOUNTER — Other Ambulatory Visit: Payer: Self-pay | Admitting: *Deleted

## 2018-04-01 MED ORDER — ICOSAPENT ETHYL 1 G PO CAPS: 2.0000 | ORAL_CAPSULE | Freq: Two times a day (BID) | ORAL | 5 refills | Status: DC

## 2018-04-21 ENCOUNTER — Other Ambulatory Visit: Payer: Self-pay | Admitting: Family Medicine

## 2018-04-21 DIAGNOSIS — R0981 Nasal congestion: Secondary | ICD-10-CM

## 2018-04-24 ENCOUNTER — Ambulatory Visit (INDEPENDENT_AMBULATORY_CARE_PROVIDER_SITE_OTHER): Payer: BLUE CROSS/BLUE SHIELD | Admitting: Gynecology

## 2018-04-24 ENCOUNTER — Telehealth: Payer: Self-pay | Admitting: *Deleted

## 2018-04-24 ENCOUNTER — Encounter: Payer: Self-pay | Admitting: Gynecology

## 2018-04-24 VITALS — BP 118/76 | Ht 65.0 in | Wt 192.0 lb

## 2018-04-24 DIAGNOSIS — Z79890 Hormone replacement therapy (postmenopausal): Secondary | ICD-10-CM | POA: Diagnosis not present

## 2018-04-24 DIAGNOSIS — Z803 Family history of malignant neoplasm of breast: Secondary | ICD-10-CM

## 2018-04-24 DIAGNOSIS — Z853 Personal history of malignant neoplasm of breast: Secondary | ICD-10-CM

## 2018-04-24 DIAGNOSIS — Z01419 Encounter for gynecological examination (general) (routine) without abnormal findings: Secondary | ICD-10-CM

## 2018-04-24 DIAGNOSIS — Z85118 Personal history of other malignant neoplasm of bronchus and lung: Secondary | ICD-10-CM

## 2018-04-24 DIAGNOSIS — Z8542 Personal history of malignant neoplasm of other parts of uterus: Secondary | ICD-10-CM

## 2018-04-24 MED ORDER — ESTRADIOL 0.075 MG/24HR TD PTTW: 1.0000 | MEDICATED_PATCH | TRANSDERMAL | 4 refills | Status: DC

## 2018-04-24 NOTE — Telephone Encounter (Signed)
Referral placed at [facility] cancer they will call patient to schedule.

## 2018-04-24 NOTE — Progress Notes (Signed)
    Carla Morales [birth year] [id number]        57 y.o.  G1P1 for annual gynecologic exam.  Doing well without gynecologic complaints.  Past medical history,surgical history, problem list, medications, allergies, family history and social history were all reviewed and documented as reviewed in the EPIC chart.  ROS:  Performed with pertinent positives and negatives included in the history, assessment and plan.   Additional significant findings : None   Exam: [name] assistant Vitals:   04/24/18 0832  BP: 118/76  Weight: 192 lb (87.1 kg)  Height: 5\' 5"  (1.651 m)   Body mass index is 31.95 kg/m.  General appearance:  Normal affect, orientation and appearance. Skin: Grossly normal HEENT: Without gross lesions.  No cervical or supraclavicular adenopathy. Thyroid normal.  Lungs:  Clear without wheezing, rales or rhonchi Cardiac: RR, without RMG Abdominal:  Soft, nontender, without masses, guarding, rebound, organomegaly or hernia Breasts:  Examined lying and sitting without masses, retractions, discharge or axillary adenopathy. Pelvic:  Ext, BUS, Vagina: Normal.  Pap smear of cuff done  Adnexa: Without masses or tenderness    Anus and perineum: Normal   Rectovaginal: Normal sphincter tone without palpated masses or tenderness.    Assessment/Plan:  57 y.o. 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   1. HRT.  Continues on Vivelle 0.075 mg patch.  I again reviewed the risks versus benefits including stroke heart attack DVT in the breast cancer issue.  The benefits as far as cardiovascular and bone health as well as symptom relief discussed.  Patient strongly desires to continue and I refilled her x1 year. 2. Maternal history of breast cancer, uterine cancer and lung cancer.  No other family members with significant cancer histories.  She had questions about genetic linkage.  I recommended she follow-up with a genetic counselor for  assessment and possible testing if they feel appropriate.  She agrees with arranging this. 3. Mammography 02/2018.  Continue with annual mammography next year.  Breast exam normal today. 4. DEXA 2016 normal.  Plan repeat DEXA at age 9. 66. Colonoscopy 2015.  Repeat at their recommended interval. 6. Pap smear 2015.  Pap smear of vaginal cuff today.  No history of abnormal Pap smears previously.  Options to stop screening per current screening guidelines based on hysterectomy reviewed. 7. Health maintenance.  Patient reports routine blood work recently done.  Follow-up in 1 year, sooner as needed.   Anastasio Auerbach MD, 8:59 AM 04/24/2018

## 2018-04-24 NOTE — Telephone Encounter (Signed)
-----   Message from Anastasio Auerbach, MD sent at 04/24/2018  9:01 AM EDT ----- Arrange genetic counselor [place] reference maternal history of breast uterine and lung cancer

## 2018-04-24 NOTE — Patient Instructions (Signed)
Follow up in one year for annual exam 

## 2018-04-24 NOTE — Addendum Note (Signed)
Addended by: Nelva Nay on: 04/24/2018 09:39 AM   Modules accepted: Orders

## 2018-04-29 LAB — PAP IG W/ RFLX HPV ASCU

## 2018-04-30 ENCOUNTER — Telehealth: Payer: Self-pay | Admitting: Genetic Counselor

## 2018-04-30 ENCOUNTER — Encounter: Payer: Self-pay | Admitting: Genetic Counselor

## 2018-04-30 NOTE — Telephone Encounter (Signed)
New genetic counseling referral received from Dr. Phineas Real. A genetic counseling appt has been scheduled for the pt to see Roma Kayser. Letter mailed to the pt.

## 2018-05-06 NOTE — Telephone Encounter (Signed)
Patient scheduled on 06/05/18

## 2018-06-04 ENCOUNTER — Other Ambulatory Visit: Payer: Self-pay | Admitting: Family Medicine

## 2018-06-05 ENCOUNTER — Inpatient Hospital Stay: Payer: BLUE CROSS/BLUE SHIELD

## 2018-06-05 ENCOUNTER — Encounter: Payer: Self-pay | Admitting: Genetic Counselor

## 2018-06-05 ENCOUNTER — Inpatient Hospital Stay: Payer: BLUE CROSS/BLUE SHIELD | Attending: Genetic Counselor | Admitting: Genetic Counselor

## 2018-06-05 DIAGNOSIS — Z808 Family history of malignant neoplasm of other organs or systems: Secondary | ICD-10-CM

## 2018-06-05 DIAGNOSIS — Z8049 Family history of malignant neoplasm of other genital organs: Secondary | ICD-10-CM

## 2018-06-05 DIAGNOSIS — Z809 Family history of malignant neoplasm, unspecified: Secondary | ICD-10-CM | POA: Diagnosis not present

## 2018-06-05 DIAGNOSIS — Z803 Family history of malignant neoplasm of breast: Secondary | ICD-10-CM

## 2018-06-05 DIAGNOSIS — Z801 Family history of malignant neoplasm of trachea, bronchus and lung: Secondary | ICD-10-CM

## 2018-06-05 NOTE — Progress Notes (Signed)
REFERRING PROVIDER: Anastasio Auerbach, MD Carla Morales, Carla Morales  PRIMARY PROVIDER:  Chipper Herb, MD  PRIMARY REASON FOR VISIT:  1. Family history of breast cancer   2. Family history of uterine cancer   3. Family history of lung cancer   4. Family history of melanoma      HISTORY OF PRESENT ILLNESS:   Carla Morales, a 57 y.o. female, was seen for a [facility] cancer genetics consultation at the request of Dr. Phineas Real due to a family history of cancer.  Carla Morales presents to clinic today to discuss the possibility of a hereditary predisposition to cancer, genetic testing, and to further clarify her future cancer risks, as well as potential cancer risks for family members.   Carla Morales is a 57 y.o. female with no personal history of cancer.  She has a family history of cancer, in which she is somewhat concerned.  She reports that her sister and paternal cousin underwent genetic testing and were negative, although her sister may have what sounds like a VUS.  CANCER HISTORY:   No history exists.     HORMONAL RISK FACTORS:  Menarche was at age 65.  First live birth at age 72.  OCP use for approximately 15 years.  Ovaries intact: yes.  Hysterectomy: yes.  Menopausal status: perimenopausal.  HRT use: 7 years. Colonoscopy: yes; normal. Mammogram within the last year: yes. Number of breast biopsies: 0. Up to date with pelvic exams:  yes. Any excessive radiation exposure in the past:  no  Past Medical History:  Diagnosis Date  . Allergy   . Arthritis   . Asthma   . Family history of breast cancer   . Family history of lung cancer   . Family history of melanoma   . Family history of uterine cancer   . Hyperlipidemia   . Migraine   . Pinched nerve in neck   . Sinus congestion   . Urinary incontinence     Past Surgical History:  Procedure Laterality Date  . bladder dialation  1968   as a child  . TONSILLECTOMY  1971  . TOTAL  VAGINAL HYSTERECTOMY  07/2010   menorrhagia, leiomyoma, prolapse  . TUBAL LIGATION  1998    Social History   Socioeconomic History  . Marital status: Married    Spouse name: Not on file  . Number of children: Not on file  . Years of education: Not on file  . Highest education level: Not on file  Occupational History  . Not on file  Social Needs  . Financial resource strain: Not on file  . Food insecurity:    Worry: Not on file    Inability: Not on file  . Transportation needs:    Medical: Not on file    Non-medical: Not on file  Tobacco Use  . Smoking status: Never Smoker  . Smokeless tobacco: Never Used  Substance and Sexual Activity  . Alcohol use: No    Alcohol/week: 0.0 oz  . Drug use: No  . Sexual activity: Yes    Partners: Male    Birth control/protection: Surgical    Comment: hyst.-1st intercourse 27 yo-1 partner  Lifestyle  . Physical activity:    Days per week: Not on file    Minutes per session: Not on file  . Stress: Not on file  Relationships  . Social connections:    Talks on phone: Not on file    Gets  together: Not on file    Attends religious service: Not on file    Active member of club or organization: Not on file    Attends meetings of clubs or organizations: Not on file    Relationship status: Not on file  Other Topics Concern  . Not on file  Social History Narrative  . Not on file     FAMILY HISTORY:  We obtained a detailed, 4-generation family history.  Significant diagnoses are listed below: Family History  Problem Relation Age of Onset  . Hypertension Father   . Melanoma Father 5  . Rheum arthritis Mother   . Fibromyalgia Mother   . Cancer Mother        breast, uterine, adenocarcinoma-right lung  . Uterine cancer Mother        Sarcoma  . Lung cancer Mother        Adenocarcinoma  . Breast cancer Mother 41  . Other Daughter        Phyllodes tumor  . Diabetes Maternal Aunt   . Diabetes Maternal Uncle   . Diabetes Paternal  Aunt   . Breast cancer Paternal Aunt 25  . Diabetes Paternal Uncle   . Heart disease Maternal Grandfather   . Asthma Sister   . Other Maternal Grandmother 30       childbirth  . Cancer Paternal Grandmother        unknown  . Cancer Maternal Aunt 35       GYN cancer - possibly vulvar?  . Appendicitis Maternal Uncle        d. 12  . Breast cancer Paternal Aunt 58  . Uterine cancer Paternal Aunt 37  . Breast cancer Paternal Aunt 51  . Colon cancer Neg Hx     The patient has one daughter and two granddaughters who are cancer free.  Her daughter did have a phyllodes tumor a year or so ago that was benign.  She has two sisters who are cancer free. One reportedly has had genetic testing and was negative, but may have what sounds like a VUS.  Both parents are living.  The patient's mother was diagnosed with breast, uterine and lung cancer.  She has not undergone genetic testing.  The patient's father had melanoma.  The patient's mother had three brothers and tow sisters.  One sister had vulvar cancer.  Both maternal grandparents are deceased.  The grandmother died in childbirth.  The grandfather died at 48.  The patient's mother had a cousin, who is a double first cousin, with breast cancer.  The patient's father had melanoma on his face.  He has four sisters and five brothers.  Three sisters had breast cancer, and one of these also had uterine and liver cancer.  This sister's daughter had undergone genetic testing and is reportedly negative.  Patient's ancestors are of Caucasian NOS descent. There is no reported Ashkenazi Jewish ancestry. There is no known consanguinity.  GENETIC COUNSELING ASSESSMENT: Carla Morales is a 57 y.o. female with a family history of breast, uterine, lung cancer and melanoma which is somewhat suggestive of a hereditary cancer syndrome and predisposition to cancer. We, therefore, discussed and recommended the following at today's visit.   DISCUSSION: We discussed that  about 5-10% of breast cancer, as well as melanoma, cases and 3-5% of uterine cancer are due to hereditary causes.  The most common cause of hereditary breast cancer is due to BRCA mutations, although other genes such as ATM, CHEK2 and PALB2 can  also cause this.  While Carla Morales has a family history that meets medical criteria for genetic testing, we feel that her risk is not greatly elevated for a hereditary cause for the breast cancer, primarily because most of the women affected were post menopausal.  Her father had melanoma at an advanced age as well, and while she meets testing criteria through the 2017 updated melanoma testing criteria, the likelihood of her testing positive for a melanoma gene is low as well.   We reviewed the characteristics, features and inheritance patterns of hereditary cancer syndromes. We also discussed genetic testing, including the appropriate family members to test, the process of testing, insurance coverage and turn-around-time for results. We discussed the implications of a negative, positive and/or variant of uncertain significant result. We recommended Carla Morales pursue genetic testing for the Multi cancer gene panel. The Multi-Gene Panel offered by [facility] includes sequencing and/or deletion duplication testing of the following 84 genes: AIP, ALK, APC, ATM, AXIN2,BAP1,  BARD1, BLM, BMPR1A, BRCA1, BRCA2, BRIP1, CASR, CDC73, CDH1, CDK4, CDKN1B, CDKN1C, CDKN2A (p14ARF), CDKN2A (p16INK4a), CEBPA, CHEK2, CTNNA1, DICER1, DIS3L2, EGFR (c.2369C>T, p.Thr790Met variant only), EPCAM (Deletion/duplication testing only), FH, FLCN, GATA2, GPC3, GREM1 (Promoter region deletion/duplication testing only), HOXB13 (c.251G>A, p.Gly84Glu), HRAS, KIT, MAX, MEN1, MET, MITF (c.952G>A, p.Glu318Lys variant only), MLH1, MSH2, MSH3, MSH6, MUTYH, NBN, NF1, NF2, NTHL1, PALB2, PDGFRA, PHOX2B, PMS2, POLD1, POLE, POT1, PRKAR1A, PTCH1, PTEN, RAD50, RAD51C, RAD51D, RB1, RECQL4, RET, RUNX1, SDHAF2, SDHA  (sequence changes only), SDHB, SDHC, SDHD, SMAD4, SMARCA4, SMARCB1, SMARCE1, STK11, SUFU, TERT, TERT, TMEM127, TP53, TSC1, TSC2, VHL, WRN and WT1.    Based on Carla Morales's family history of cancer, she meets medical criteria for genetic testing. Despite that she meets criteria, she may still have an out of pocket cost. We discussed that if her out of pocket cost for testing is over $100, the laboratory will call and confirm whether she wants to proceed with testing.  If the out of pocket cost of testing is less than $100 she will be billed by the genetic testing laboratory.   In order to estimate her chance of having a BRCA or CDKN2A mutation, we used statistical models (BRCAPro, Tyrer Cusik and MelaPro) and laboratory data that take into account her personal medical history, family history and ancestry.  Because each model is different, there can be a lot of variability in the risks they give.  Therefore, these numbers must be considered a rough range and not a precise risk of having a BRCA or Melanoma gene mutation.  These models estimate that she has approximately a 0.26-0.03% chance of having a BRCA mutation and 0.00% risk for a CDKN2A mutation increasing the risk for Melanoma.   Based on the patient's personal and family history, statistical models (BRCAPro and [employer])  and literature data were used to estimate her risk of developing breast cancer. These estimate her lifetime risk of developing breast cancer to be approximately 10.57% to 28.8%.  Tyrer Cusik (the higher risk) does use additional calculations including height, weight and breast density to estimate their risk.  This estimation does not take into account any genetic testing results. The patient's lifetime breast cancer risk is a preliminary estimate based on available information using one of several models endorsed by the [place] (ACS). The ACS recommends consideration of breast MRI screening as an adjunct to  mammography for patients at high risk (defined as 20% or greater lifetime risk). A more detailed breast cancer risk assessment can be  considered, if clinically indicated. We will discuss this further, and put into context, when her genetic test results come back.     PLAN: After considering the risks, benefits, and limitations, Carla Morales  provided informed consent to pursue genetic testing and the blood sample was sent to [facility] for analysis of the Multi-cancer gene panel. Results should be available within approximately 2-3 weeks' time, at which point they will be disclosed by telephone to Carla Morales, as will any additional recommendations warranted by these results. Carla Morales will receive a summary of her genetic counseling visit and a copy of her results once available. This information will also be available in Epic. We encouraged Carla Morales to remain in contact with cancer genetics annually so that we can continuously update the family history and inform her of any changes in cancer genetics and testing that may be of benefit for her family. Carla Morales were answered to her satisfaction today. Our contact information was provided should additional Morales or concerns arise.  Lastly, we encouraged Carla Morales to remain in contact with cancer genetics annually so that we can continuously update the family history and inform her of any changes in cancer genetics and testing that may be of benefit for this family.   Ms.  Morales Morales were answered to her satisfaction today. Our contact information was provided should additional Morales or concerns arise. Thank you for the referral and allowing [place] to share in the care of your patient.   Carla Morales P. Florene Glen, [place], [facility] Certified Genetic Counselor [email or web address] phone: [phone]  The patient was seen for a total of 36 minutes in face-to-face genetic counseling.  This patient was discussed with Drs.  Magrinat, Lindi Adie and/or Burr Medico who agrees with the above.    _______________________________________________________________________ For Office Staff:  Number of people involved in session: 1 Was an Intern/ student involved with case: no

## 2018-06-09 ENCOUNTER — Other Ambulatory Visit: Payer: Self-pay | Admitting: Family Medicine

## 2018-06-10 NOTE — Telephone Encounter (Signed)
Last Vit D 03/29/18  53.7

## 2018-06-19 ENCOUNTER — Encounter: Payer: Self-pay | Admitting: Genetic Counselor

## 2018-06-19 DIAGNOSIS — Z1379 Encounter for other screening for genetic and chromosomal anomalies: Secondary | ICD-10-CM | POA: Insufficient documentation

## 2018-06-21 ENCOUNTER — Telehealth: Payer: Self-pay | Admitting: Genetic Counselor

## 2018-06-21 ENCOUNTER — Ambulatory Visit: Payer: Self-pay | Admitting: Genetic Counselor

## 2018-06-21 DIAGNOSIS — Z1379 Encounter for other screening for genetic and chromosomal anomalies: Secondary | ICD-10-CM

## 2018-06-21 NOTE — Telephone Encounter (Signed)
Revealed negative genetic testing.  Discussed that we do not know why there is cancer in the family. It could be due to a different gene that we are not testing, or maybe our current technology may not be able to pick something up.  It will be important for her to keep in contact with genetics to keep up with whether additional testing may be needed.  

## 2018-06-21 NOTE — Progress Notes (Signed)
HPI:  [name] was previously seen in the [place] clinic due to a family history of cancer and concerns regarding a hereditary predisposition to cancer. Please refer to our prior cancer genetics clinic note for more information regarding [name]'s medical, social and family histories, and our assessment and recommendations, at the time. [name] recent genetic test results were disclosed to her, as were recommendations warranted by these results. These results and recommendations are discussed in more detail below.  CANCER HISTORY:   No history exists.    FAMILY HISTORY:  We obtained a detailed, 4-generation family history.  Significant diagnoses are listed below: Family History  Problem Relation Age of Onset  . Hypertension Father   . Melanoma Father 55  . Rheum arthritis Mother   . Fibromyalgia Mother   . Cancer Mother        breast, uterine, adenocarcinoma-right lung  . Uterine cancer Mother        Sarcoma  . Lung cancer Mother        Adenocarcinoma  . Breast cancer Mother 25  . Other Daughter        Phyllodes tumor  . Diabetes Maternal Aunt   . Diabetes Maternal Uncle   . Diabetes Paternal Aunt   . Breast cancer Paternal Aunt 37  . Diabetes Paternal Uncle   . Heart disease Maternal Grandfather   . Asthma Sister   . Other Maternal Grandmother 30       childbirth  . Cancer Paternal Grandmother        unknown  . Cancer Maternal Aunt 1       GYN cancer - possibly vulvar?  . Appendicitis Maternal Uncle        d. 12  . Breast cancer Paternal Aunt 57  . Uterine cancer Paternal Aunt 75  . Breast cancer Paternal Aunt 35  . Colon cancer Neg Hx     The patient has one daughter and two granddaughters who are cancer free.  Her daughter did have a phyllodes tumor a year or so ago that was benign.  She has two sisters who are cancer free. One reportedly has had genetic testing and was negative, but may have what sounds like a VUS.  Both parents are  living.  The patient's mother was diagnosed with breast, uterine and lung cancer.  She has not undergone genetic testing.  The patient's father had melanoma.  The patient's mother had three brothers and tow sisters.  One sister had vulvar cancer.  Both maternal grandparents are deceased.  The grandmother died in childbirth.  The grandfather died at 54.  The patient's mother had a cousin, who is a double first cousin, with breast cancer.  The patient's father had melanoma on his face.  He has four sisters and five brothers.  Three sisters had breast cancer, and one of these also had uterine and liver cancer.  This sister's daughter had undergone genetic testing and is reportedly negative.  Patient's ancestors are of Caucasian N[place] descent. There is no reported Ashkenazi Jewish ancestry. There is no known consanguinity.  GENETIC TEST RESULTS: Genetic testing reported out on June 19, 2018 through the Multi-cancer panel found no deleterious mutations.  The Multi-Gene Panel offered by [facility] includes sequencing and/or deletion duplication testing of the following 84 genes: AIP, ALK, APC, ATM, AXIN2,BAP1,  BARD1, BLM, BMPR1A, BRCA1, BRCA2, BRIP1, CASR, CDC73, CDH1, CDK4, CDKN1B, CDKN1C, CDKN2A (p14ARF), CDKN2A (p16INK4a), CEBPA, CHEK2, CTNNA1, DICER1, DIS3L2, EGFR (c.2369C>T, p.Thr790Met variant  only), EPCAM (Deletion/duplication testing only), FH, FLCN, GATA2, GPC3, GREM1 (Promoter region deletion/duplication testing only), HOXB13 (c.251G>A, p.Gly84Glu), HRAS, KIT, MAX, MEN1, MET, MITF (c.952G>A, p.Glu318Lys variant only), MLH1, MSH2, MSH3, MSH6, MUTYH, NBN, NF1, NF2, NTHL1, PALB2, PDGFRA, PHOX2B, PMS2, POLD1, POLE, POT1, PRKAR1A, PTCH1, PTEN, RAD50, RAD51C, RAD51D, RB1, RECQL4, RET, RUNX1, SDHAF2, SDHA (sequence changes only), SDHB, SDHC, SDHD, SMAD4, SMARCA4, SMARCB1, SMARCE1, STK11, SUFU, TERC, TERT, TMEM127, TP53, TSC1, TSC2, VHL, WRN and WT1.  The test report has been scanned into EPIC and is located  under the Molecular Pathology section of the Results Review tab.    We discussed with [name] that since the current genetic testing is not perfect, it is possible there may be a gene mutation in one of these genes that current testing cannot detect, but that chance is small.  We also discussed, that it is possible that another gene that has not yet been discovered, or that we have not yet tested, is responsible for the cancer diagnoses in the family, and it is, therefore, important to remain in touch with cancer genetics in the future so that we can continue to offer [name] the most up to date genetic testing.     CANCER SCREENING RECOMMENDATIONS: This normal result is reassuring and indicates that [name] does not likely have an increased risk of cancer due to a mutation in one of these genes.  We, therefore, recommended  [name] continue to follow the cancer screening guidelines provided by her primary healthcare providers.   An individual's cancer risk and medical management are not determined by genetic test results alone. Overall cancer risk assessment incorporates additional factors, including personal medical history, family history, and any available genetic information that may result in a personalized plan for cancer prevention and surveillance.  Based on [name]'s personal and family history of cancer, as well as her genetic test results, statistical models (Carla Morales)  and literature data were used to estimate her risk of developing breast cancer. These estimate her lifetime risk of developing breast cancer to be approximately 10.7% to 27.4%.  The patient's lifetime breast cancer risk is a preliminary estimate based on available information using one of several models endorsed by the Carla Morales (Carla Morales). The Carla Morales recommends consideration of breast MRI screening as an adjunct to mammography for patients at high risk (defined as 20% or greater lifetime risk). A  more detailed breast cancer risk assessment can be considered, if clinically indicated.   [name] has been determined to be at high risk for breast cancer.  Therefore, we recommend that annual screening with mammography and breast MRI.  We, therefore, discussed that it is reasonable for [name] to be followed by a high-risk breast cancer clinic. We recommended [name] discuss these options further with her gynecologist, oncology and/or primary providers. Carla Morales has a high-risk breast clinic.  Referrals to this clinic can be made to [phone].   RECOMMENDATIONS FOR FAMILY MEMBERS:  Women in this family might be at some increased risk of developing cancer, over the general population risk, simply due to the family history of cancer.  We recommended women in this family have a yearly mammogram beginning at age 67, or 48 years younger than the earliest onset of cancer, an annual clinical breast exam, and perform monthly breast self-exams. Women in this family should also have a gynecological exam as recommended by their primary provider. All family members should have a colonoscopy by age 50.  FOLLOW-UP: Lastly, we discussed with [name] that cancer genetics is a rapidly advancing field and it is possible that new genetic tests will be appropriate for her and/or her family members in the future. We encouraged her to remain in contact with cancer genetics on an annual basis so we can update her personal and family histories and let her know of advances in cancer genetics that may benefit this family.   Our contact number was provided. [name] questions were answered to her satisfaction, and she knows she is welcome to call us at anytime with additional questions or concerns.   Roma Kayser, MS, [facility] Certified Genetic Counselor [email or web address]

## 2018-06-21 NOTE — Telephone Encounter (Signed)
LM on VM that results are back and to please CB. 

## 2018-06-24 ENCOUNTER — Telehealth: Payer: Self-pay | Admitting: *Deleted

## 2018-06-24 ENCOUNTER — Telehealth: Payer: Self-pay | Admitting: Oncology

## 2018-06-24 NOTE — Telephone Encounter (Signed)
I called and spoke with Seth Bake (referral coordinator ) was told she will call and offer referral to patient and I don't have to call her.

## 2018-06-24 NOTE — Telephone Encounter (Signed)
Received a call from Stuart at [facility] to get the pt scheduled for high risk breast clinic. Pt has been scheduled to see Dr. Jana Hakim on 9/4 at 4pm. Pt agreed tot he appt date and time.

## 2018-06-24 NOTE — Progress Notes (Signed)
I called and spoke with Seth Bake (referral coordinator ) and was told she will call patient to offer referral and I can close my end of this message.

## 2018-06-24 NOTE — Telephone Encounter (Signed)
-----   Message from Anastasio Auerbach, MD sent at 06/23/2018 10:14 AM EDT ----- Offered patient appointment in the high risk breast clinic as noted below ----- Message ----- From: Clarene Essex, Counselor Sent: 06/21/2018   1:16 PM EDT To: Anastasio Auerbach, MD, Chipper Herb, MD  Negative genetic testing.  Patient at high risk for breast cancer. Dr. Phineas Real - if you'd like her to be seen in the [facility], please refer her to Drs. Lindi Adie, [place] or [place] for high risk.  Thank you,  Santiago Glad

## 2018-06-25 NOTE — Telephone Encounter (Signed)
Appointment on 07/17/18 @ 4:00pm

## 2018-07-08 ENCOUNTER — Other Ambulatory Visit: Payer: Self-pay | Admitting: Family Medicine

## 2018-07-08 MED ORDER — ALPRAZOLAM 0.5 MG PO TABS: 0.5000 mg | ORAL_TABLET | Freq: Every evening | ORAL | 1 refills | Status: DC | PRN

## 2018-07-08 NOTE — Telephone Encounter (Signed)
PT is wanting to know if she can get a refill on xanax the one that she has expired and no refills left, pharmacy [facility]

## 2018-07-08 NOTE — Telephone Encounter (Signed)
Please advise 

## 2018-07-16 ENCOUNTER — Telehealth: Payer: Self-pay | Admitting: Oncology

## 2018-07-16 NOTE — Telephone Encounter (Signed)
Per staff msg from Melissa X, pt lft a vm to cancel appt with Dr. Jana Hakim sch on 9/4. I cld and lft the pt a vm to reschedule.

## 2018-07-17 ENCOUNTER — Inpatient Hospital Stay: Payer: BLUE CROSS/BLUE SHIELD | Admitting: Oncology

## 2018-08-25 ENCOUNTER — Other Ambulatory Visit: Payer: Self-pay | Admitting: Family Medicine

## 2018-10-09 ENCOUNTER — Ambulatory Visit: Payer: BLUE CROSS/BLUE SHIELD | Admitting: Family Medicine

## 2018-10-09 ENCOUNTER — Encounter: Payer: Self-pay | Admitting: Family Medicine

## 2018-10-09 VITALS — BP 115/73 | HR 72 | Temp 98.4°F | Ht 65.0 in | Wt 194.0 lb

## 2018-10-09 DIAGNOSIS — Z23 Encounter for immunization: Secondary | ICD-10-CM | POA: Diagnosis not present

## 2018-10-09 DIAGNOSIS — S149XXA Injury of unspecified nerves of neck, initial encounter: Secondary | ICD-10-CM | POA: Diagnosis not present

## 2018-10-09 DIAGNOSIS — G589 Mononeuropathy, unspecified: Secondary | ICD-10-CM

## 2018-10-09 DIAGNOSIS — J301 Allergic rhinitis due to pollen: Secondary | ICD-10-CM

## 2018-10-09 DIAGNOSIS — E7800 Pure hypercholesterolemia, unspecified: Secondary | ICD-10-CM | POA: Diagnosis not present

## 2018-10-09 DIAGNOSIS — F439 Reaction to severe stress, unspecified: Secondary | ICD-10-CM

## 2018-10-09 DIAGNOSIS — Z6830 Body mass index (BMI) 30.0-30.9, adult: Secondary | ICD-10-CM

## 2018-10-09 DIAGNOSIS — E559 Vitamin D deficiency, unspecified: Secondary | ICD-10-CM

## 2018-10-09 MED ORDER — ALBUTEROL SULFATE HFA 108 (90 BASE) MCG/ACT IN AERS: 2.0000 | INHALATION_SPRAY | Freq: Four times a day (QID) | RESPIRATORY_TRACT | 3 refills | Status: DC | PRN

## 2018-10-09 MED ORDER — MELOXICAM 15 MG PO TABS: 15.0000 mg | ORAL_TABLET | Freq: Every day | ORAL | 0 refills | Status: DC

## 2018-10-09 NOTE — Progress Notes (Signed)
Subjective:    Patient ID: Carla Morales, female    DOB: 06-28-1961, 57 y.o.   MRN: [id number]  HPI  Pt here for follow up and management of chronic medical problems which includes hyperlipidemia. She is taking medication regularly.  Patient is doing well overall.  She is very concerned that her mom's cancer is progressing and this is been very stressful on her.  She will be given an FOBT to return and will get lab work today.  She also get her flu shot today.  She is requesting refills on several of her medicines.  We did spend some time talking about her mother who is on hospice because of metastatic cancer.  She has had lung cancer breast cancer and uterine cancer.  Been drinking more caffeine and she understands she needs to reduce her caffeine intake she has not been resting as well.  She does have some Xanax that she takes only if needed.  She is accepting of the fact that her mother's conditioning is worsening and that she may not be here for a long.  Today the patient denies any chest pain pressure tightness shortness of breath trouble swallowing heartburn indigestion nausea vomiting diarrhea blood in the stool or black tarry bowel movements.  She has alternating constipation with loose stools.  Her next colonoscopy is not due until 2025.    Patient Active Problem List   Diagnosis Date Noted  . Genetic testing 06/19/2018  . Family history of breast cancer   . Family history of uterine cancer   . Family history of lung cancer   . Family history of melanoma   . BMI 30.0-30.9,adult 03/29/2018  . Chest pressure 11/27/2013  . Bronchospasm 11/27/2013  . Allergy   . Hyperlipidemia   . Migraine   . Pinched nerve in neck    Outpatient Encounter Medications as of 10/09/2018  Medication Sig  . albuterol (PROVENTIL HFA) 108 (90 Base) MCG/ACT inhaler Inhale 2 puffs into the lungs every 6 (six) hours as needed for wheezing or shortness of breath.  . ALPRAZolam (XANAX) 0.5 MG tablet Take 1  tablet (0.5 mg total) by mouth at bedtime as needed for anxiety.  Marland Kitchen atorvastatin (LIPITOR) 10 MG tablet TAKE 1 TABLET BY MOUTH DAILY  . estradiol (VIVELLE-DOT) 0.075 MG/24HR Place 1 patch onto the skin 2 (two) times a week.  . Vitamin D, Ergocalciferol, (DRISDOL) 50000 units CAPS capsule TAKE 1 CAPSULE BY MOUTH EVERY 7 DAYS GENERIC EQUIVALENT FOR DRISDOL  . [DISCONTINUED] albuterol (PROVENTIL HFA) 108 (90 Base) MCG/ACT inhaler Inhale 2 puffs into the lungs every 6 (six) hours as needed for wheezing or shortness of breath.  . meloxicam (MOBIC) 15 MG tablet Take 1 tablet (15 mg total) by mouth daily.  . [DISCONTINUED] amoxicillin (AMOXIL) 500 MG capsule Take 500 mg by mouth 3 (three) times daily.  . [DISCONTINUED] traMADol (ULTRAM) 50 MG tablet Take by mouth every 6 (six) hours as needed.   No facility-administered encounter medications on file as of 10/09/2018.      Review of Systems  Constitutional: Negative.   HENT: Negative.   Eyes: Negative.   Respiratory: Negative.   Cardiovascular: Negative.   Gastrointestinal: Negative.   Endocrine: Negative.   Genitourinary: Negative.   Musculoskeletal: Negative.   Skin: Negative.   Allergic/Immunologic: Negative.   Neurological: Negative.   Hematological: Negative.   Psychiatric/Behavioral: Negative.        Stress       Objective:   Physical Exam  Constitutional: She is oriented to person, place, and time. She appears well-developed and well-nourished. She appears distressed.  The patient is sad and stressed with her mother's health.  She does have as needed Xanax to take at home when things are more stressful.  HENT:  Head: Normocephalic and atraumatic.  Right Ear: External ear normal.  Left Ear: External ear normal.  Mouth/Throat: Oropharynx is clear and moist. No oropharyngeal exudate.  Nasal turbinate congestion  Eyes: Pupils are equal, round, and reactive to light. Conjunctivae and EOM are normal. Right eye exhibits no discharge.  Left eye exhibits no discharge. No scleral icterus.  Up-to-date on eye exams  Neck: Normal range of motion. Neck supple. No thyromegaly present.  No bruits thyromegaly or anterior cervical adenopathy  Cardiovascular: Normal rate, regular rhythm, normal heart sounds and intact distal pulses.  No murmur heard. Heart is regular without murmurs and no edema.  There are good pedal pulses.  Pulmonary/Chest: Effort normal and breath sounds normal. She has no wheezes. She has no rales.  Clear anteriorly and posteriorly  Abdominal: Soft. Bowel sounds are normal. She exhibits no mass. There is no tenderness.  No liver or spleen enlargement.  No epigastric or suprapubic tenderness.  No masses or bruits.  Musculoskeletal: Normal range of motion. She exhibits no edema.  Lymphadenopathy:    She has no cervical adenopathy.  Neurological: She is alert and oriented to person, place, and time. She has normal reflexes. No cranial nerve deficit.  Skin: Skin is warm and dry. No rash noted.  Psychiatric: She has a normal mood and affect. Her behavior is normal. Judgment and thought content normal.  Patient's mood affect and behavior are normal for her under the current circumstances.  Nursing note and vitals reviewed.  BP 115/73 (BP Location: Left Arm)   Pulse 72   Temp 98.4 F (36.9 C) (Oral)   Ht _0  (1.651 m)   Wt 194 lb (88 kg)   LMP 07/24/2010   BMI 32.28 kg/m         Assessment & Plan:  1. Vitamin D deficiency -Continue with current treatment pending results of lab work - CBC with Differential/Platelet - VITAMIN D 25 Hydroxy (Vit-D Deficiency, Fractures)  2. Pure hypercholesterolemia -Continue with healthy eating and low sugar and diet and reduced caffeine - CBC with Differential/Platelet - BMP8+EGFR - Lipid panel - Hepatic function panel  3. Pinched nerve in neck -Takes as needed meloxicam and understands that this can irritate her stomach and will be careful with this. -  meloxicam (MOBIC) 15 MG tablet; Take 1 tablet (15 mg total) by mouth daily.  Dispense: 90 tablet; Refill: 0 - CBC with Differential/Platelet  4. Acute seasonal allergic rhinitis due to pollen -Continue with inhaler and use nasal saline for nasal congestion - albuterol (PROVENTIL HFA) 108 (90 Base) MCG/ACT inhaler; Inhale 2 puffs into the lungs every 6 (six) hours as needed for wheezing or shortness of breath.  Dispense: 54 g; Refill: 3 - CBC with Differential/Platelet  5. Situational stress -Patient has Xanax at home and will take this only if needed and the least amount possible.  6. BMI 30.0-30.9,adult -Continue with aggressive therapeutic lifestyle changes geared to losing weight  7. Need for immunization against influenza - Flu Vaccine QUAD 36+ mos IM   Meds ordered this encounter  Medications  . meloxicam (MOBIC) 15 MG tablet    Sig: Take 1 tablet (15 mg total) by mouth daily.    Dispense:  90 tablet  Refill:  0  . albuterol (PROVENTIL HFA) 108 (90 Base) MCG/ACT inhaler    Sig: Inhale 2 puffs into the lungs every 6 (six) hours as needed for wheezing or shortness of breath.    Dispense:  54 g    Refill:  3   Patient Instructions  Continue current medications. Continue good therapeutic lifestyle changes which include good diet and exercise. Fall precautions discussed with patient. If an FOBT was given today- please return it to our front desk. If you are over 63 years old - you may need Prevnar 43 or the adult Pneumonia vaccine.  **Flu shots are available--- please call and schedule a FLU-CLINIC appointment**  After your visit with [place] today you will receive a survey in the mail or online from [employer] regarding your care with [place]. Please take a moment to fill this out. Your feedback is very important to [place] as you can help [place] better understand your patient needs as well as improve your experience and satisfaction. WE CARE ABOUT YOU!!!   We will be praying for you and  your family and your mom and dad especially. Drink plenty of water and stay well-hydrated Continue with current medicines  Arrie Senate MD

## 2018-10-09 NOTE — Patient Instructions (Addendum)
Continue current medications. Continue good therapeutic lifestyle changes which include good diet and exercise. Fall precautions discussed with patient. If an FOBT was given today- please return it to our front desk. If you are over 55 years old - you may need Prevnar 11 or the adult Pneumonia vaccine.  **Flu shots are available--- please call and schedule a FLU-CLINIC appointment**  After your visit with [place] today you will receive a survey in the mail or online from [employer] regarding your care with [place]. Please take a moment to fill this out. Your feedback is very important to [place] as you can help [place] better understand your patient needs as well as improve your experience and satisfaction. WE CARE ABOUT YOU!!!   We will be praying for you and your family and your mom and dad especially. Drink plenty of water and stay well-hydrated Continue with current medicines

## 2018-10-10 LAB — LIPID PANEL
Chol/HDL Ratio: 3 ratio (ref 0.0–4.4)
Cholesterol, Total: 146 mg/dL (ref 100–199)
HDL: 49 mg/dL (ref >39)
LDL Calculated: 60 mg/dL (ref 0–99)
TRIGLYCERIDES: 186 mg/dL — AB (ref 0–149)
VLDL Cholesterol Cal: 37 mg/dL (ref 5–40)

## 2018-10-10 LAB — CBC WITH DIFFERENTIAL/PLATELET
BASOS ABS: 0.1 10*3/uL (ref 0.0–0.2)
Basos: 1 %
EOS (ABSOLUTE): 0.2 10*3/uL (ref 0.0–0.4)
Eos: 3 %
Hematocrit: 38.1 % (ref 34.0–46.6)
Hemoglobin: 12.6 g/dL (ref 11.1–15.9)
IMMATURE GRANULOCYTES: 0 %
Immature Grans (Abs): 0 10*3/uL (ref 0.0–0.1)
LYMPHS ABS: 2.3 10*3/uL (ref 0.7–3.1)
Lymphs: 31 %
MCH: 29.5 pg (ref 26.6–33.0)
MCHC: 33.1 g/dL (ref 31.5–35.7)
MCV: 89 fL (ref 79–97)
MONOS ABS: 0.5 10*3/uL (ref 0.1–0.9)
Monocytes: 7 %
NEUTROS PCT: 58 %
Neutrophils Absolute: 4.3 10*3/uL (ref 1.4–7.0)
PLATELETS: 265 10*3/uL (ref 150–450)
RBC: 4.27 x10E6/uL (ref 3.77–5.28)
RDW: 13.1 % (ref 12.3–15.4)
WBC: 7.4 10*3/uL (ref 3.4–10.8)

## 2018-10-10 LAB — BMP8+EGFR
BUN/Creatinine Ratio: 17 (ref 9–23)
BUN: 14 mg/dL (ref 6–24)
CHLORIDE: 103 mmol/L (ref 96–106)
CO2: 21 mmol/L (ref 20–29)
Calcium: 9.7 mg/dL (ref 8.7–10.2)
Creatinine, Ser: 0.83 mg/dL (ref 0.57–1.00)
GFR calc Af Amer: 92 mL/min/{1.73_m2} (ref >59)
GFR calc non Af Amer: 80 mL/min/{1.73_m2} (ref >59)
GLUCOSE: 97 mg/dL (ref 65–99)
POTASSIUM: 4.1 mmol/L (ref 3.5–5.2)
SODIUM: 139 mmol/L (ref 134–144)

## 2018-10-10 LAB — HEPATIC FUNCTION PANEL
ALBUMIN: 4 g/dL (ref 3.5–5.5)
ALK PHOS: 131 IU/L — AB (ref 39–117)
ALT: 27 IU/L (ref 0–32)
AST: 20 IU/L (ref 0–40)
BILIRUBIN TOTAL: 0.5 mg/dL (ref 0.0–1.2)
Bilirubin, Direct: 0.11 mg/dL (ref 0.00–0.40)
TOTAL PROTEIN: 7 g/dL (ref 6.0–8.5)

## 2018-10-10 LAB — VITAMIN D 25 HYDROXY (VIT D DEFICIENCY, FRACTURES): Vit D, 25-Hydroxy: 66.5 ng/mL (ref 30.0–100.0)

## 2018-11-04 ENCOUNTER — Telehealth: Payer: Self-pay | Admitting: Family Medicine

## 2018-11-04 MED ORDER — OSELTAMIVIR PHOSPHATE 75 MG PO CAPS: 75.0000 mg | ORAL_CAPSULE | Freq: Every day | ORAL | 0 refills | Status: DC

## 2018-11-04 NOTE — Telephone Encounter (Signed)
Pharmacy [facility]    PT states she has been around family member that has flu and wants to know if we can send in tamiflu to pharmacy

## 2018-11-04 NOTE — Telephone Encounter (Signed)
tamiflu rx sent to pharmacy- 1 tab daily for 10 days

## 2018-11-16 ENCOUNTER — Other Ambulatory Visit: Payer: Self-pay | Admitting: Family Medicine

## 2018-12-02 DIAGNOSIS — Z1283 Encounter for screening for malignant neoplasm of skin: Secondary | ICD-10-CM | POA: Diagnosis not present

## 2018-12-02 DIAGNOSIS — L820 Inflamed seborrheic keratosis: Secondary | ICD-10-CM | POA: Diagnosis not present

## 2018-12-02 DIAGNOSIS — D225 Melanocytic nevi of trunk: Secondary | ICD-10-CM | POA: Diagnosis not present

## 2018-12-02 DIAGNOSIS — L304 Erythema intertrigo: Secondary | ICD-10-CM | POA: Diagnosis not present

## 2019-02-03 ENCOUNTER — Encounter: Payer: Self-pay | Admitting: Family Medicine

## 2019-02-04 ENCOUNTER — Other Ambulatory Visit: Payer: Self-pay | Admitting: *Deleted

## 2019-02-04 DIAGNOSIS — J301 Allergic rhinitis due to pollen: Secondary | ICD-10-CM

## 2019-02-04 MED ORDER — ALBUTEROL SULFATE HFA 108 (90 BASE) MCG/ACT IN AERS: 2.0000 | INHALATION_SPRAY | Freq: Four times a day (QID) | RESPIRATORY_TRACT | 11 refills | Status: DC | PRN

## 2019-03-18 ENCOUNTER — Other Ambulatory Visit: Payer: Self-pay | Admitting: Family Medicine

## 2019-03-18 DIAGNOSIS — R0981 Nasal congestion: Secondary | ICD-10-CM

## 2019-04-11 ENCOUNTER — Ambulatory Visit: Payer: BLUE CROSS/BLUE SHIELD | Admitting: Family Medicine

## 2019-05-02 ENCOUNTER — Other Ambulatory Visit: Payer: Self-pay | Admitting: Family Medicine

## 2019-05-02 NOTE — Telephone Encounter (Signed)
[facility] pt needs 6 mos appt w/ a new provider. RF sent into mail order pharmacy

## 2019-05-09 ENCOUNTER — Other Ambulatory Visit: Payer: Self-pay | Admitting: Gynecology

## 2019-06-03 ENCOUNTER — Ambulatory Visit: Payer: BLUE CROSS/BLUE SHIELD | Admitting: Nurse Practitioner

## 2019-06-03 ENCOUNTER — Other Ambulatory Visit: Payer: Self-pay

## 2019-06-03 ENCOUNTER — Encounter: Payer: Self-pay | Admitting: Nurse Practitioner

## 2019-06-03 VITALS — BP 114/70 | HR 53 | Temp 97.8°F | Ht 65.0 in | Wt 196.0 lb

## 2019-06-03 DIAGNOSIS — R110 Nausea: Secondary | ICD-10-CM | POA: Diagnosis not present

## 2019-06-03 DIAGNOSIS — G44319 Acute post-traumatic headache, not intractable: Secondary | ICD-10-CM

## 2019-06-03 DIAGNOSIS — M25512 Pain in left shoulder: Secondary | ICD-10-CM | POA: Diagnosis not present

## 2019-06-03 DIAGNOSIS — S161XXA Strain of muscle, fascia and tendon at neck level, initial encounter: Secondary | ICD-10-CM | POA: Diagnosis not present

## 2019-06-03 MED ORDER — PREDNISONE 20 MG PO TABS: ORAL_TABLET | ORAL | 0 refills | Status: DC

## 2019-06-03 NOTE — Patient Instructions (Signed)
Intracranial Pressure Monitoring  Intracranial pressure is pressure in the skull. Intracranial pressure monitoring allows your health care provider to measure and observe the pressure and any pressure changes inside the skull. Monitoring happens after a brain injury that causes the brain to swell. It helps to guide treatment. Monitoring happens at the hospital, often in the intensive care unit (ICU). How is intracranial pressure monitored? There are three main ways to check intracranial pressure:  Putting a thin, flexible tube (catheter) through a drilled hole into one of the fluid-filled spaces (ventricles) in the brain.  Putting a small pressure sensor inside a drilled hole in the skull.  Putting a hollow screw or bolt in the space between the brain, its coverings, and the skull. What are the risks? Generally, intracranial pressure monitoring is safe. However, problems may occur, depending on the type of procedure you have:  Infection.  Bleeding.  Allergic reaction to medicines.  Damage to other structures or organs.  Procedure failure. This means that your health care provider is not able to complete the procedure, or the procedure does not work.  Brain tissue injury that causes lasting problems. What happens if intracranial pressure is high? An increase in intracranial pressure can be life-threatening. If pressure is high, the brain may not work as well as it did before your injury. If intracranial pressure rises higher than your blood pressure, blood will stop flowing to the brain. This can cause brain tissue death. If intracranial pressure is high, your health care provider can drain the fluid around the brain (cerebrospinal fluid). If the catheter or screw or bolt method is used, the fluid can be removed during monitoring. Summary  Intracranial pressure monitoring happens after a brain injury that causes the brain to swell. It helps to guide treatment.  There are three main  ways to check intracranial pressure, and they involve a thin, flexible tube (catheter), a pressure sensor, or a hollow screw or bolt.  An increase in intracranial pressure can be life-threatening. This information is not intended to replace advice given to you by your health care provider. Make sure you discuss any questions you have with your health care provider. Document Released: 01/29/2006 Document Revised: 10/12/2017 Document Reviewed: 09/22/2016 Elsevier Patient Education  2020 [employer].
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
## 2019-06-24 ENCOUNTER — Other Ambulatory Visit: Payer: Self-pay | Admitting: Gynecology

## 2019-06-24 DIAGNOSIS — Z1231 Encounter for screening mammogram for malignant neoplasm of breast: Secondary | ICD-10-CM

## 2019-07-11 ENCOUNTER — Other Ambulatory Visit: Payer: Self-pay | Admitting: Family Medicine

## 2019-07-16 DIAGNOSIS — Z20828 Contact with and (suspected) exposure to other viral communicable diseases: Secondary | ICD-10-CM | POA: Diagnosis not present

## 2019-07-22 ENCOUNTER — Other Ambulatory Visit: Payer: Self-pay | Admitting: Family Medicine

## 2019-07-22 MED ORDER — ATORVASTATIN CALCIUM 10 MG PO TABS: 10.0000 mg | ORAL_TABLET | Freq: Every day | ORAL | 0 refills | Status: DC

## 2019-07-22 NOTE — Addendum Note (Signed)
Addended by: Zannie Cove on: 07/22/2019 03:57 PM   Modules accepted: Orders

## 2019-07-22 NOTE — Telephone Encounter (Signed)
Rakes. NTBS to est care. Last lipids 09/2018

## 2019-07-26 ENCOUNTER — Other Ambulatory Visit: Payer: Self-pay | Admitting: Gynecology

## 2019-07-28 ENCOUNTER — Ambulatory Visit (INDEPENDENT_AMBULATORY_CARE_PROVIDER_SITE_OTHER): Payer: BC Managed Care – PPO | Admitting: Family Medicine

## 2019-07-28 DIAGNOSIS — U071 COVID-19: Secondary | ICD-10-CM | POA: Diagnosis not present

## 2019-07-28 MED ORDER — PREDNISONE 10 MG PO TABS: ORAL_TABLET | ORAL | 0 refills | Status: DC

## 2019-07-28 MED ORDER — HYDROCODONE-HOMATROPINE 5-1.5 MG/5ML PO SYRP: 5.0000 mL | ORAL_SOLUTION | Freq: Four times a day (QID) | ORAL | 0 refills | Status: AC | PRN

## 2019-07-28 NOTE — Progress Notes (Signed)
Subjective:    Patient ID: Carla Morales, female    DOB: 1960-12-19, 58 y.o.   MRN: [id number]   HPI: Carla Morales is a 58 y.o. female presenting for lingering cough after dx of COVID on 9/4. Deep, dry cough. Taking amoxicillin for infected tooth. Worst days were 9/3-4. Bad HA, moderately severe cough, congestion and diarrhea. Denies fever. Lost sense of taste and smell at onset. Now weak, winded easily and coughing. Cough keeping her awake.    Depression screen [facility] 2/9 06/03/2019 10/09/2018 03/29/2018 01/02/2018 09/28/2017  Decreased Interest 0 2 0 0 0  Down, Depressed, Hopeless 0 2 0 0 0  PHQ - 2 Score 0 4 0 0 0  Altered sleeping - 2 - - -  Tired, decreased energy - 2 - - -  Change in appetite - 2 - - -  Feeling bad or failure about yourself  - 0 - - -  Trouble concentrating - 1 - - -  Moving slowly or fidgety/restless - 0 - - -  Suicidal thoughts - 0 - - -  PHQ-9 Score - 11 - - -  Difficult doing work/chores - Somewhat difficult - - -     Relevant past medical, surgical, family and social history reviewed and updated as indicated.  Interim medical history since our last visit reviewed. Allergies and medications reviewed and updated.  ROS:  Review of Systems  Constitutional: Negative.   HENT: Negative.   Eyes: Negative for visual disturbance.  Respiratory: Negative for shortness of breath.   Cardiovascular: Negative for chest pain.  Gastrointestinal: Negative for abdominal pain.  Musculoskeletal: Negative for arthralgias.     Social History   Tobacco Use  Smoking Status Never Smoker  Smokeless Tobacco Never Used       Objective:     Wt Readings from Last 3 Encounters:  06/03/19 196 lb (88.9 kg)  10/09/18 194 lb (88 kg)  04/24/18 192 lb (87.1 kg)     Exam deferred. Pt. Harboring due to COVID 19. Phone visit performed.   Assessment & Plan:   1. COVID-19 virus infection     Meds ordered this encounter  Medications  . HYDROcodone-homatropine  (HYCODAN) 5-1.5 MG/5ML syrup    Sig: Take 5 mLs by mouth every 6 (six) hours as needed for up to 5 days for cough.    Dispense:  100 mL    Refill:  0  . predniSONE (DELTASONE) 10 MG tablet    Sig: Take 5 daily for 2 days followed by 4,3,2 and 1 for 2 days each.    Dispense:  30 tablet    Refill:  0        Diagnoses and all orders for this visit:  COVID-19 virus infection  Other orders -     HYDROcodone-homatropine (HYCODAN) 5-1.5 MG/5ML syrup; Take 5 mLs by mouth every 6 (six) hours as needed for up to 5 days for cough. -     predniSONE (DELTASONE) 10 MG tablet; Take 5 daily for 2 days followed by 4,3,2 and 1 for 2 days each.  Consider hospitalization if dyspnea progresses.  Virtual Visit via telephone Note  I discussed the limitations, risks, security and privacy concerns of performing an evaluation and management service by telephone and the availability of in person appointments. The patient was identified with two identifiers. Pt.expressed understanding and agreed to proceed. Pt. Is at home. Dr. Livia Snellen is in his office.  Follow Up Instructions:   I discussed the  assessment and treatment plan with the patient. The patient was provided an opportunity to ask questions and all were answered. The patient agreed with the plan and demonstrated an understanding of the instructions.   The patient was advised to call back or seek an in-person evaluation if the symptoms worsen or if the condition fails to improve as anticipated.   Total minutes including chart review and phone contact time: 21   Follow up plan: No follow-ups on file.  Claretta Fraise, MD [place]

## 2019-08-01 ENCOUNTER — Telehealth: Payer: Self-pay | Admitting: Family Medicine

## 2019-08-01 NOTE — Telephone Encounter (Signed)
Patient states that she is not having to take cough medication during the day but is having a cough a night due to asthma.  Patient wants to know if she can be released to return to [facility].

## 2019-08-01 NOTE — Telephone Encounter (Signed)
She has to be symptom free for 72 hours without medications.

## 2019-08-04 ENCOUNTER — Encounter: Payer: Self-pay | Admitting: Family Medicine

## 2019-08-05 NOTE — Telephone Encounter (Signed)
Multiple attempts made to contact patient.  This encounter will now be closed  

## 2019-08-06 ENCOUNTER — Encounter: Payer: Self-pay | Admitting: Gynecology

## 2019-08-07 DIAGNOSIS — R05 Cough: Secondary | ICD-10-CM | POA: Diagnosis not present

## 2019-08-07 DIAGNOSIS — J40 Bronchitis, not specified as acute or chronic: Secondary | ICD-10-CM | POA: Diagnosis not present

## 2019-08-07 DIAGNOSIS — B370 Candidal stomatitis: Secondary | ICD-10-CM | POA: Diagnosis not present

## 2019-08-07 DIAGNOSIS — U071 COVID-19: Secondary | ICD-10-CM | POA: Diagnosis not present

## 2019-08-07 DIAGNOSIS — R918 Other nonspecific abnormal finding of lung field: Secondary | ICD-10-CM | POA: Diagnosis not present

## 2019-08-08 ENCOUNTER — Ambulatory Visit: Payer: BLUE CROSS/BLUE SHIELD

## 2019-08-12 ENCOUNTER — Telehealth: Payer: Self-pay | Admitting: Family Medicine

## 2019-08-12 ENCOUNTER — Encounter: Payer: Self-pay | Admitting: Family

## 2019-08-12 ENCOUNTER — Ambulatory Visit (INDEPENDENT_AMBULATORY_CARE_PROVIDER_SITE_OTHER): Payer: BC Managed Care – PPO | Admitting: Family

## 2019-08-12 DIAGNOSIS — J4521 Mild intermittent asthma with (acute) exacerbation: Secondary | ICD-10-CM | POA: Diagnosis not present

## 2019-08-12 DIAGNOSIS — B9689 Other specified bacterial agents as the cause of diseases classified elsewhere: Secondary | ICD-10-CM

## 2019-08-12 DIAGNOSIS — R0602 Shortness of breath: Secondary | ICD-10-CM

## 2019-08-12 DIAGNOSIS — J208 Acute bronchitis due to other specified organisms: Secondary | ICD-10-CM | POA: Diagnosis not present

## 2019-08-12 DIAGNOSIS — U071 COVID-19: Secondary | ICD-10-CM | POA: Diagnosis not present

## 2019-08-12 DIAGNOSIS — B370 Candidal stomatitis: Secondary | ICD-10-CM

## 2019-08-12 MED ORDER — BENZONATATE 200 MG PO CAPS: 200.0000 mg | ORAL_CAPSULE | Freq: Three times a day (TID) | ORAL | 1 refills | Status: DC | PRN

## 2019-08-12 MED ORDER — DOXYCYCLINE HYCLATE 100 MG PO TABS: 100.0000 mg | ORAL_TABLET | Freq: Two times a day (BID) | ORAL | 0 refills | Status: DC

## 2019-08-12 MED ORDER — NYSTATIN 100000 UNIT/ML MT SUSP: 5.0000 mL | Freq: Four times a day (QID) | OROMUCOSAL | 1 refills | Status: DC

## 2019-08-12 NOTE — Telephone Encounter (Signed)
Patient tested positive for Covid two weeks ago.  She was treated and cleared to return to work last week.  On Tuesday, she was still short of breath and was experiencing mouth and throat irritation.  She went to the urgent care and was given Prednisone dose pack, inhaler, Diflucan tablet and Nystatin.  Has completed Nystatin, still taking Prednisone dose pack and using inhaler.  Is still experiencing some shortness of breath and mouth irritation.  Tele-visit scheduled today at 11:10 am with [facility].

## 2019-08-12 NOTE — Progress Notes (Signed)
Virtual Visit via telephone Note Due to COVID-19 pandemic this visit was conducted virtually. This visit type was conducted due to national recommendations for restrictions regarding the COVID-19 Pandemic (e.g. social distancing, sheltering in place) in an effort to limit this patient's exposure and mitigate transmission in our community. All issues noted in this document were discussed and addressed.  A physical exam was not performed with this format.  I connected with Carla Morales on 08/12/19 at 11:23 AM by telephone and verified that I am speaking with the correct person using two identifiers. Carla Morales is currently located at work and no one is currently with her during visit. The provider, [name], FNP is located in their office at time of visit.  I discussed the limitations, risks, security and privacy concerns of performing an evaluation and management service by telephone and the availability of in person appointments. I also discussed with the patient that there may be a patient responsible charge related to this service. The patient expressed understanding and agreed to proceed.   History and Present Illness:  HPI Pt calls the office today with complaints of SOB. She was diagnosed with COVID on 07/16/19 and has a hx of asthma. She continued to have a cough and had an appointment with Dr. Livia Snellen on 07/28/19 and was given prednisone and cough syrup. She states she continued with SOB and cough and went to the Urgent Care on 08/07/19. She had a negative chest x-ray and was given another round of prednisone, steroid injection, and nystatin rinse for oral thrush. She states her SOB and cough have improved, but is still lingering. She states she has SOB with exertion. She has been using her albuterol inhaler 1-2 times a day.   Reports dry nonproductive cough that is worse at night.    Review of Systems  Constitutional: Positive for malaise/fatigue.  Respiratory: Positive for  cough.   All other systems reviewed and are negative.    Observations/Objective: No SOB or distress noted  Assessment and Plan: 1. COVID-19 virus detected  2. SOB (shortness of breath) - doxycycline (VIBRA-TABS) 100 MG tablet; Take 1 tablet (100 mg total) by mouth 2 (two) times daily.  Dispense: 20 tablet; Refill: 0  3. Mild intermittent asthma with acute exacerbation - benzonatate (TESSALON) 200 MG capsule; Take 1 capsule (200 mg total) by mouth 3 (three) times daily as needed.  Dispense: 30 capsule; Refill: 1  4. Acute bacterial bronchitis - doxycycline (VIBRA-TABS) 100 MG tablet; Take 1 tablet (100 mg total) by mouth 2 (two) times daily.  Dispense: 20 tablet; Refill: 0 - benzonatate (TESSALON) 200 MG capsule; Take 1 capsule (200 mg total) by mouth 3 (three) times daily as needed.  Dispense: 30 capsule; Refill: 1  5. Oral thrush - nystatin (MYCOSTATIN) 100000 UNIT/ML suspension; Take 5 mLs (500,000 Units total) by mouth 4 (four) times daily.  Dispense: 473 mL; Refill: 1  Continue prednisone Continue albuterol inhaler as needed Will start doxycyline today  Report any worsening of symptoms or if they do not improve Restart nystatin suspension.  Go to ED if SOB worsens    I discussed the assessment and treatment plan with the patient. The patient was provided an opportunity to ask questions and all were answered. The patient agreed with the plan and demonstrated an understanding of the instructions.   The patient was advised to call back or seek an in-person evaluation if the symptoms worsen or if the condition fails to improve as anticipated.  The above assessment and management plan was discussed with the patient. The patient verbalized understanding of and has agreed to the management plan. Patient is aware to call the clinic if symptoms persist or worsen. Patient is aware when to return to the clinic for a follow-up visit. Patient educated on when it is appropriate to go to the  emergency department.   Time call ended:  11:43 AM    I provided 20 minutes of non-face-to-face time during this encounter.    [name], FNP

## 2019-09-01 ENCOUNTER — Other Ambulatory Visit: Payer: Self-pay

## 2019-09-03 ENCOUNTER — Encounter: Payer: Self-pay | Admitting: Gynecology

## 2019-09-03 ENCOUNTER — Other Ambulatory Visit: Payer: Self-pay

## 2019-09-03 ENCOUNTER — Ambulatory Visit (INDEPENDENT_AMBULATORY_CARE_PROVIDER_SITE_OTHER): Payer: BC Managed Care – PPO | Admitting: Gynecology

## 2019-09-03 VITALS — BP 122/76 | Ht 66.0 in | Wt 195.0 lb

## 2019-09-03 DIAGNOSIS — N952 Postmenopausal atrophic vaginitis: Secondary | ICD-10-CM

## 2019-09-03 DIAGNOSIS — Z79890 Hormone replacement therapy (postmenopausal): Secondary | ICD-10-CM

## 2019-09-03 DIAGNOSIS — K921 Melena: Secondary | ICD-10-CM

## 2019-09-03 DIAGNOSIS — Z01419 Encounter for gynecological examination (general) (routine) without abnormal findings: Secondary | ICD-10-CM | POA: Diagnosis not present

## 2019-09-03 MED ORDER — ESTRADIOL 0.075 MG/24HR TD PTTW: MEDICATED_PATCH | TRANSDERMAL | 0 refills | Status: DC

## 2019-09-03 NOTE — Patient Instructions (Signed)
The gastroenterology office should call to arrange an appointment.  If you do not hear from them over the next week or so call my office.  Follow-up in 1 year for annual exam.

## 2019-09-03 NOTE — Progress Notes (Signed)
    Carla Morales 1961/10/20 [id number]        58 y.o.  G1P1 for annual gynecologic exam.  Patient relates having Covid approximately 6 weeks ago.  Also notes mucousy stool since then.  Most recently over the last several days she has noticed dark blood in her stool.  Having no pain with defecation.  Last colonoscopy 2015  Past medical history,surgical history, problem list, medications, allergies, family history and social history were all reviewed and documented as reviewed in the EPIC chart.  ROS:  Performed with pertinent positives and negatives included in the history, assessment and plan.   Additional significant findings : None   Exam: [name] assistant Vitals:   09/03/19 1526  BP: 122/76  Weight: 195 lb (88.5 kg)  Height: 5\' 6"  (1.676 m)   Body mass index is 31.47 kg/m.  General appearance:  Normal affect, orientation and appearance. Skin: Grossly normal HEENT: Without gross lesions.  No cervical or supraclavicular adenopathy. Thyroid normal.  Lungs:  Clear without wheezing, rales or rhonchi Cardiac: RR, without RMG Abdominal:  Soft, nontender, without masses, guarding, rebound, organomegaly or hernia Breasts:  Examined lying and sitting without masses, retractions, discharge or axillary adenopathy. Pelvic:  Ext, BUS, Vagina: With mild atrophic changes  Adnexa: Without masses or tenderness    Anus and perineum: Normal   Rectovaginal: Normal sphincter tone without palpated masses or tenderness.    Assessment/Plan:  58 y.o. G1P1 female for annual gynecologic exam.  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 HRT.  Continues on Vivelle 0.075 mg patch.  We again discussed the risks versus benefits to include thrombosis such as stroke heart attack DVT in the breast cancer issue.  At this point she wants to continue and I refilled her x1 year. 2. Mucousy stool with blood.  Recommended GI follow-up and we will make a referral. 3. Mammography overdue and the  patient knows to schedule.  Breast exam normal today.  History of maternal breast cancer uterine cancer and lung cancer.  The patient underwent genetic testing with a negative panel last year. 4. DEXA 2016 normal.  Plan repeat DEXA at age 58. 35. Pap smear 2019.  No Pap smear done today.  No history of abnormal Pap smears.  Options to stop screening per current screening guidelines versus less frequent screening intervals.  Will readdress on annual basis. 6. Health maintenance.  No routine lab work done as patients in the process of arranging a primary physician annual exam.  Follow-up 1 year, sooner as needed.   Anastasio Auerbach MD, 3:57 PM 09/03/2019

## 2019-09-04 ENCOUNTER — Encounter: Payer: Self-pay | Admitting: *Deleted

## 2019-09-04 ENCOUNTER — Encounter: Payer: Self-pay | Admitting: Physician Assistant

## 2019-09-04 ENCOUNTER — Telehealth: Payer: Self-pay | Admitting: *Deleted

## 2019-09-04 DIAGNOSIS — K921 Melena: Secondary | ICD-10-CM

## 2019-09-04 NOTE — Telephone Encounter (Signed)
-----   Message from Anastasio Auerbach, MD sent at 09/03/2019  4:01 PM EDT ----- Refer to [facility] reference mucousy stool with blood

## 2019-09-04 NOTE — Telephone Encounter (Signed)
Referral placed at [facility] they will call to schedule.  

## 2019-09-05 NOTE — Telephone Encounter (Signed)
Patient scheduled at 09/18/19 @ 9:00am

## 2019-09-18 ENCOUNTER — Encounter: Payer: Self-pay | Admitting: Physician Assistant

## 2019-09-18 ENCOUNTER — Ambulatory Visit
Admission: RE | Admit: 2019-09-18 | Discharge: 2019-09-18 | Disposition: A | Discharge status: Home / Self Care | Payer: BC Managed Care – PPO | Source: Ambulatory Visit | Attending: Gynecology | Admitting: Gynecology

## 2019-09-18 ENCOUNTER — Ambulatory Visit (INDEPENDENT_AMBULATORY_CARE_PROVIDER_SITE_OTHER): Payer: BC Managed Care – PPO | Admitting: Physician Assistant

## 2019-09-18 ENCOUNTER — Other Ambulatory Visit: Payer: Self-pay

## 2019-09-18 VITALS — BP 124/72 | HR 67 | Temp 97.0°F | Ht 66.0 in | Wt 192.7 lb

## 2019-09-18 DIAGNOSIS — R194 Change in bowel habit: Secondary | ICD-10-CM

## 2019-09-18 DIAGNOSIS — K921 Melena: Secondary | ICD-10-CM

## 2019-09-18 DIAGNOSIS — Z1159 Encounter for screening for other viral diseases: Secondary | ICD-10-CM

## 2019-09-18 DIAGNOSIS — K219 Gastro-esophageal reflux disease without esophagitis: Secondary | ICD-10-CM

## 2019-09-18 DIAGNOSIS — R1310 Dysphagia, unspecified: Secondary | ICD-10-CM | POA: Diagnosis not present

## 2019-09-18 DIAGNOSIS — Z1231 Encounter for screening mammogram for malignant neoplasm of breast: Secondary | ICD-10-CM

## 2019-09-18 DIAGNOSIS — R490 Dysphonia: Secondary | ICD-10-CM

## 2019-09-18 MED ORDER — NA SULFATE-K SULFATE-MG SULF 17.5-3.13-1.6 GM/177ML PO SOLN: 1.0000 | ORAL | 0 refills | Status: DC

## 2019-09-18 MED ORDER — OMEPRAZOLE 40 MG PO CPDR: 40.0000 mg | DELAYED_RELEASE_CAPSULE | Freq: Every day | ORAL | 2 refills | Status: DC

## 2019-09-18 NOTE — Patient Instructions (Addendum)
You have been scheduled for an endoscopy and colonoscopy. Please follow the written instructions given to you at your visit today. Please pick up your prep supplies at the pharmacy within the next 1-3 days. If you use inhalers (even only as needed), please bring them with you on the day of your procedure.  We have sent the following medications to your pharmacy for you to pick up at your convenience: Omeprazole 40 mg daily   Start Miralax twice a day one week before your colonoscopy.

## 2019-09-18 NOTE — Progress Notes (Signed)
Chief Complaint: Bloody stools and abdominal pain  HPI:    Carla Morales is a 58 year old Caucasian female with a past medical history as listed below, previously known to Dr. Olevia Perches, who was referred to me by Baruch Gouty, FNP for a complaint of bloody stools and abdominal pain..      09/18/2014 colonoscopy by Dr. Olevia Perches with mild diverticulosis in the sigmoid colon and otherwise normal.  Repeat recommended in 10 years.    09/03/2019 patient saw her gynecologist and noted that she had Covid 6 weeks prior and had mucousy stool since then and over the last several days had noticed dark blood in her stool.  Rectal exam by gynecologist was normal.    Today, the patient presents to clinic and explains that she had Covid about 6 weeks ago now and ever since then has had some exaggeration of her chronic GI problems.  Tells me that chronically she radiates from constipation to diarrhea and always sees a small amount of mucus in her stool.  Since having Covid this has been exaggerated, she also had 3 days of hematochezia seeing some maroon blood in her stool.  She does also see some bright red blood occasionally.  Over the past week or so the mucous has seemed to decrease and the blood has stopped.  Tells me she believes this was worse because of her 2 rounds of Prednisone, Prednisone shot, two cough meds, Nystatin and inhaler which she was on during her Covid episode.    Also describes chronic burning in her epigastrium and up into her esophagus, used to take Ranitidine, but when this got pulled off the market she stopped taking anything.  Also describes episodes of dysphagia noting that food seems to get stuck in her throat at times, this can occur once or twice every week.  Also with increase in voice hoarseness ever since having Covid.  She tells me at one point she felt raw in there and this is when she took the nystatin, still feels slightly raw, but in general is better than when she had Covid.    Denies  fever, chills, weight loss, nausea, vomiting or symptoms that awaken her from sleep.  Past Medical History:  Diagnosis Date  . Allergy   . Arthritis   . Asthma   . COVID-19   . Hyperlipidemia   . Migraine   . Pinched nerve in neck   . Sinus congestion   . Urinary incontinence     Past Surgical History:  Procedure Laterality Date  . bladder dialation  1968   as a child  . TONSILLECTOMY  1971  . TOTAL VAGINAL HYSTERECTOMY  07/2010   menorrhagia, leiomyoma, prolapse  . TUBAL LIGATION  1998    Current Outpatient Medications  Medication Sig Dispense Refill  . albuterol (PROVENTIL HFA) 108 (90 Base) MCG/ACT inhaler Inhale 2 puffs into the lungs every 6 (six) hours as needed for wheezing or shortness of breath. 18 g 11  . ALPRAZolam (XANAX) 0.5 MG tablet Take 1 tablet (0.5 mg total) by mouth at bedtime as needed for anxiety. 90 tablet 1  . atorvastatin (LIPITOR) 10 MG tablet Take 1 tablet (10 mg total) by mouth daily. 90 tablet 0  . benzonatate (TESSALON) 200 MG capsule Take 1 capsule (200 mg total) by mouth 3 (three) times daily as needed. (Patient not taking: Reported on 09/03/2019) 30 capsule 1  . doxycycline (VIBRA-TABS) 100 MG tablet Take 1 tablet (100 mg total) by mouth 2 (  two) times daily. (Patient not taking: Reported on 09/03/2019) 20 tablet 0  . estradiol (VIVELLE-DOT) 0.075 MG/24HR UNWRAP AND APPLY 1 PATCH TO SKIN 2 TIMES PER WEEK. 24 patch 0  . fluticasone (FLONASE) 50 MCG/ACT nasal spray USE 2 SPRAYS IN EACH NOSTRIL DAILY GENERIC EQUIVALENT FOR FLONASE 48 g 3  . meloxicam (MOBIC) 15 MG tablet Take 1 tablet (15 mg total) by mouth daily. 90 tablet 0  . nystatin (MYCOSTATIN) 100000 UNIT/ML suspension Take 5 mLs (500,000 Units total) by mouth 4 (four) times daily. (Patient not taking: Reported on 09/03/2019) 473 mL 1  . predniSONE (DELTASONE) 10 MG tablet Take 5 daily for 2 days followed by 4,3,2 and 1 for 2 days each. (Patient not taking: Reported on 09/03/2019) 30 tablet 0  .  Vitamin D, Ergocalciferol, (DRISDOL) 1.25 MG (50000 UT) CAPS capsule TAKE 1 CAPSULE BY MOUTH EVERY 7 DAYS GENERIC EQUIVALENT FOR DRISDOL 12 capsule 0   No current facility-administered medications for this visit.     Allergies as of 09/18/2019 - Review Complete 09/03/2019  Allergen Reaction Noted  . Other Swelling 10/16/2013  . Haldol [haloperidol decanoate]  05/19/2011  . Pravastatin Other (See Comments) 08/15/2013  . Sulfa antibiotics Rash 05/19/2011    Family History  Problem Relation Age of Onset  . Hypertension Father   . Melanoma Father 61  . Rheum arthritis Mother   . Fibromyalgia Mother   . Cancer Mother        breast, uterine, adenocarcinoma-right lung  . Uterine cancer Mother        Sarcoma  . Lung cancer Mother        Adenocarcinoma  . Breast cancer Mother 43  . Other Daughter        Phyllodes tumor  . Diabetes Maternal Aunt   . Diabetes Maternal Uncle   . Diabetes Paternal Aunt   . Breast cancer Paternal Aunt 25  . Diabetes Paternal Uncle   . Heart disease Maternal Grandfather   . Asthma Sister   . Other Maternal Grandmother 30       childbirth  . Cancer Paternal Grandmother        unknown  . Cancer Maternal Aunt 63       GYN cancer - possibly vulvar?  . Appendicitis Maternal Uncle        d. 12  . Breast cancer Paternal Aunt 83  . Uterine cancer Paternal Aunt 69  . Breast cancer Paternal Aunt 66  . Colon cancer Neg Hx     Social History   Socioeconomic History  . Marital status: Married    Spouse name: Not on file  . Number of children: Not on file  . Years of education: Not on file  . Highest education level: Not on file  Occupational History  . Not on file  Social Needs  . Financial resource strain: Not on file  . Food insecurity    Worry: Not on file    Inability: Not on file  . Transportation needs    Medical: Not on file    Non-medical: Not on file  Tobacco Use  . Smoking status: Never Smoker  . Smokeless tobacco: Never Used   Substance and Sexual Activity  . Alcohol use: No    Alcohol/week: 0.0 standard drinks  . Drug use: No  . Sexual activity: Yes    Partners: Male    Birth control/protection: Surgical    Comment: hyst.-1st intercourse 65 yo-1 partner  Lifestyle  . Physical activity  Days per week: Not on file    Minutes per session: Not on file  . Stress: Not on file  Relationships  . Social Herbalist on phone: Not on file    Gets together: Not on file    Attends religious service: Not on file    Active member of club or organization: Not on file    Attends meetings of clubs or organizations: Not on file    Relationship status: Not on file  . Intimate partner violence    Fear of current or ex partner: Not on file    Emotionally abused: Not on file    Physically abused: Not on file    Forced sexual activity: Not on file  Other Topics Concern  . Not on file  Social History Narrative  . Not on file    Review of Systems:    Constitutional: No weight loss, fever or chills Skin: No rash  Cardiovascular: No chest pain Respiratory: No SOB  Gastrointestinal: See HPI and otherwise negative Genitourinary: No dysuria  Neurological: No headache, dizziness or syncope Musculoskeletal: No new muscle or joint pain Hematologic: No bruising Psychiatric: No history of depression or anxiety   Physical Exam:  Vital signs: Temp (!) 97 F (36.1 C)   Ht '5\' 6"'$  (1.676 m)   Wt 192 lb 11.2 oz (87.4 kg)   LMP 07/24/2010   BMI 31.10 kg/m   Constitutional:   Pleasant overweight Caucasian female appears to be in NAD, Well developed, Well nourished, alert and cooperative Head:  Normocephalic and atraumatic. Eyes:   PEERL, EOMI. No icterus. Conjunctiva pink. Ears:  Normal auditory acuity. Neck:  Supple Throat: Oral cavity and pharynx without inflammation, swelling or lesion.  Respiratory: Respirations even and unlabored. Lungs clear to auscultation bilaterally.   No wheezes, crackles, or rhonchi.   Cardiovascular: Normal S1, S2. No MRG. Regular rate and rhythm. No peripheral edema, cyanosis or pallor.  Gastrointestinal:  Soft, nondistended, mild epigastric ttp No rebound or guarding. Normal bowel sounds. No appreciable masses or hepatomegaly. Rectal:  Not performed.  Msk:  Symmetrical without gross deformities. Without edema, no deformity or joint abnormality.  Neurologic:  Alert and  oriented x4;  grossly normal neurologically.  Skin:   Dry and intact without significant lesions or rashes. Psychiatric: Demonstrates good judgement and reason without abnormal affect or behaviors.  No recent labs or imaging.  Assessment: 1.  Epigastric pain: Likely related to reflux and gastritis 2.  Hematochezia: For 3 days, maroon blood mixed in with stool, now none over the past week; consider colitis vs hemorrhoids vs other 3.  Change in bowel habits: Radiates back-and-forth from constipation to diarrhea which is chronic for her, now although she has some urgency ; consider relation to being on medications for Covid +/-IBS  4.  GERD: Chronic, worse since Covid, was on Zantac, now nothing 5.  Dysphagia: Occasionally feels like food gets stuck; consider esophageal stricture versus esophagitis 6.  Hoarseness: Consider relation to Covid +/- GERD  Plan: 1.  Discussed with patient that due to her myriad of GI complaints would recommend that she have an EGD and colonoscopy for further evaluation.  Did discuss risks, benefits, limitations and alternatives and the patient agrees to proceed.  These were scheduled with Dr. Hilarie Fredrickson in the [facility]. 2.  Prescribed Omeprazole 40 mg daily, 30-60 minutes before breakfast #30 with 3 refills. 3.  Patient tells me she did not feel clean prior to her colonoscopy 5 years ago,  recommended that she take MiraLAX twice daily for a week leading up to her procedures. 4.  Patient to follow in clinic per recommendations from Dr. Hilarie Fredrickson after time of procedures.  Ellouise Newer, PA-C  [facility] 09/18/2019, 9:00 AM  Cc: Baruch Gouty, FNP

## 2019-09-18 NOTE — Progress Notes (Signed)
Addendum: Reviewed and agree with assessment and management plan. Seeley Southgate M, MD  

## 2019-09-22 ENCOUNTER — Other Ambulatory Visit: Payer: Self-pay | Admitting: Gynecology

## 2019-09-22 DIAGNOSIS — N644 Mastodynia: Secondary | ICD-10-CM

## 2019-09-26 ENCOUNTER — Other Ambulatory Visit: Payer: Self-pay | Admitting: Family Medicine

## 2019-10-01 ENCOUNTER — Encounter: Payer: Self-pay | Admitting: Internal Medicine

## 2019-10-02 ENCOUNTER — Ambulatory Visit (INDEPENDENT_AMBULATORY_CARE_PROVIDER_SITE_OTHER): Payer: BC Managed Care – PPO

## 2019-10-02 ENCOUNTER — Other Ambulatory Visit: Payer: Self-pay | Admitting: Internal Medicine

## 2019-10-02 DIAGNOSIS — Z1159 Encounter for screening for other viral diseases: Secondary | ICD-10-CM | POA: Diagnosis not present

## 2019-10-03 LAB — SARS CORONAVIRUS 2 (TAT 6-24 HRS): SARS Coronavirus 2: NEGATIVE

## 2019-10-06 ENCOUNTER — Encounter: Payer: Self-pay | Admitting: Internal Medicine

## 2019-10-06 ENCOUNTER — Ambulatory Visit (AMBULATORY_SURGERY_CENTER): Payer: BC Managed Care – PPO | Admitting: Internal Medicine

## 2019-10-06 ENCOUNTER — Other Ambulatory Visit: Payer: Self-pay

## 2019-10-06 VITALS — BP 105/42 | HR 68 | Temp 98.3°F | Resp 13 | Ht 66.0 in | Wt 192.0 lb

## 2019-10-06 DIAGNOSIS — D120 Benign neoplasm of cecum: Secondary | ICD-10-CM | POA: Diagnosis not present

## 2019-10-06 DIAGNOSIS — D123 Benign neoplasm of transverse colon: Secondary | ICD-10-CM

## 2019-10-06 DIAGNOSIS — R194 Change in bowel habit: Secondary | ICD-10-CM

## 2019-10-06 DIAGNOSIS — R1310 Dysphagia, unspecified: Secondary | ICD-10-CM | POA: Diagnosis not present

## 2019-10-06 DIAGNOSIS — D122 Benign neoplasm of ascending colon: Secondary | ICD-10-CM | POA: Diagnosis not present

## 2019-10-06 DIAGNOSIS — D124 Benign neoplasm of descending colon: Secondary | ICD-10-CM

## 2019-10-06 DIAGNOSIS — D125 Benign neoplasm of sigmoid colon: Secondary | ICD-10-CM

## 2019-10-06 DIAGNOSIS — K5730 Diverticulosis of large intestine without perforation or abscess without bleeding: Secondary | ICD-10-CM | POA: Diagnosis not present

## 2019-10-06 MED ORDER — SODIUM CHLORIDE 0.9 % IV SOLN: 500.0000 mL | Freq: Once | INTRAVENOUS | Status: DC

## 2019-10-06 NOTE — Op Note (Signed)
[place] Patient Name: Carla Morales Procedure Date: 10/06/2019 3:21 PM MRN: [id number] Endoscopist: Jerene Bears , MD Age: 58 Referring MD:  Date of Birth: [birth year] Gender: Female Account #: [id number] Procedure:                Colonoscopy Indications:              Last colonoscopy: November 2015, Hematochezia,                            Incidental change in bowel habits noted Medicines:                Monitored Anesthesia Care Procedure:                Pre-Anesthesia Assessment:                           - Prior to the procedure, a History and Physical                            was performed, and patient medications and                            allergies were reviewed. The patient's tolerance of                            previous anesthesia was also reviewed. The risks                            and benefits of the procedure and the sedation                            options and risks were discussed with the patient.                            All questions were answered, and informed consent                            was obtained. Prior Anticoagulants: The patient has                            taken no previous anticoagulant or antiplatelet                            agents. ASA Grade Assessment: II - A patient with                            mild systemic disease. After reviewing the risks                            and benefits, the patient was deemed in                            satisfactory condition to undergo the procedure.  After obtaining informed consent, the colonoscope                            was passed under direct vision. Throughout the                            procedure, the patient's blood pressure, pulse, and                            oxygen saturations were monitored continuously. The                            Colonoscope was introduced through the anus and                            advanced to the cecum,  identified by appendiceal                            orifice and ileocecal valve. The colonoscopy was                            performed without difficulty. The patient tolerated                            the procedure well. The quality of the bowel                            preparation was good. The ileocecal valve,                            appendiceal orifice, and rectum were photographed. Scope In: 3:44:41 PM Scope Out: 4:04:11 PM Scope Withdrawal Time: 0 hours 14 minutes 40 seconds  Total Procedure Duration: 0 hours 19 minutes 30 seconds  Findings:                 The digital rectal exam was normal.                           A 6 mm polyp was found in the cecum. The polyp was                            sessile. The polyp was removed with a cold snare.                            Resection and retrieval were complete.                           A 4 mm polyp was found in the ascending colon. The                            polyp was sessile. The polyp was removed with a  cold snare. Resection and retrieval were complete.                           Two sessile polyps were found in the transverse                            colon. The polyps were 4 to 5 mm in size. These                            polyps were removed with a cold snare. Resection                            and retrieval were complete.                           A 5 mm polyp was found in the descending colon. The                            polyp was sessile. The polyp was removed with a                            cold snare. Resection and retrieval were complete.                           A 6 mm polyp was found in the sigmoid colon. The                            polyp was sessile. The polyp was removed with a                            cold snare. Resection and retrieval were complete.                           Multiple small-mouthed diverticula were found in                            the sigmoid  colon, descending colon and ascending                            colon.                           Internal hemorrhoids were found during                            retroflexion. The hemorrhoids were small. Complications:            No immediate complications. Estimated Blood Loss:     Estimated blood loss was minimal. Impression:               - One 6 mm polyp in the cecum, removed with a cold                            snare. Resected  and retrieved.                           - One 4 mm polyp in the ascending colon, removed                            with a cold snare. Resected and retrieved.                           - Two 4 to 5 mm polyps in the transverse colon,                            removed with a cold snare. Resected and retrieved.                           - One 5 mm polyp in the descending colon, removed                            with a cold snare. Resected and retrieved.                           - One 6 mm polyp in the sigmoid colon, removed with                            a cold snare. Resected and retrieved.                           - Diverticulosis in the sigmoid colon, in the                            descending colon and in the ascending colon.                           - Small internal hemorrhoids. Recommendation:           - Patient has a contact number available for                            emergencies. The signs and symptoms of potential                            delayed complications were discussed with the                            patient. Return to normal activities tomorrow.                            Written discharge instructions were provided to the                            patient.                           - Resume previous diet.                           -  Continue present medications.                           - Await pathology results.                           - Repeat colonoscopy is recommended for                            surveillance.  The colonoscopy date will be                            determined after pathology results from today's                            exam become available for review. Jerene Bears, MD 10/06/2019 4:19:42 PM This report has been signed electronically.

## 2019-10-06 NOTE — Patient Instructions (Signed)
Thank you for allowing [place] to care for you today!  Await pathology results by mail, approximately 2 weeks.  Will make recommendation for next colonoscopy at that time  Continue with Omeprazole 40 mg by mouth daily to help heal the esophagitis.  Resume other medications today.  Soft diet today, resume your normal diet tomorrow.  Resume your normal activities tomorrow.    YOU HAD AN ENDOSCOPIC PROCEDURE TODAY AT [facility]:   Refer to the procedure report that was given to you for any specific questions about what was found during the examination.  If the procedure report does not answer your questions, please call your gastroenterologist to clarify.  If you requested that your care partner not be given the details of your procedure findings, then the procedure report has been included in a sealed envelope for you to review at your convenience later.  YOU SHOULD EXPECT: Some feelings of bloating in the abdomen. Passage of more gas than usual.  Walking can help get rid of the air that was put into your GI tract during the procedure and reduce the bloating. If you had a lower endoscopy (such as a colonoscopy or flexible sigmoidoscopy) you may notice spotting of blood in your stool or on the toilet paper. If you underwent a bowel prep for your procedure, you may not have a normal bowel movement for a few days.  Please Note:  You might notice some irritation and congestion in your nose or some drainage.  This is from the oxygen used during your procedure.  There is no need for concern and it should clear up in a day or so.  SYMPTOMS TO REPORT IMMEDIATELY:   Following lower endoscopy (colonoscopy or flexible sigmoidoscopy):  Excessive amounts of blood in the stool  Significant tenderness or worsening of abdominal pains  Swelling of the abdomen that is new, acute  Fever of 100F or higher   Following upper endoscopy (EGD)  Vomiting of blood or coffee ground material  New  chest pain or pain under the shoulder blades  Painful or persistently difficult swallowing  New shortness of breath  Fever of 100F or higher  Black, tarry-looking stools  For urgent or emergent issues, a gastroenterologist can be reached at any hour by calling [phone].   DIET:  We do recommend a small meal at first, but then you may proceed to your regular diet.  Drink plenty of fluids but you should avoid alcoholic beverages for 24 hours.  ACTIVITY:  You should plan to take it easy for the rest of today and you should NOT DRIVE or use heavy machinery until tomorrow (because of the sedation medicines used during the test).    FOLLOW UP: Our staff will call the number listed on your records 48-72 hours following your procedure to check on you and address any questions or concerns that you may have regarding the information given to you following your procedure. If we do not reach you, we will leave a message.  We will attempt to reach you two times.  During this call, we will ask if you have developed any symptoms of COVID 19. If you develop any symptoms (ie: fever, flu-like symptoms, shortness of breath, cough etc.) before then, please call [phone].  If you test positive for Covid 19 in the 2 weeks post procedure, please call and report this information to [place].    If any biopsies were taken you will be contacted by phone or by letter  within the next 1-3 weeks.  Please call us at [phone] if you have not heard about the biopsies in 3 weeks.    SIGNATURES/CONFIDENTIALITY: You and/or your care partner have signed paperwork which will be entered into your electronic medical record.  These signatures attest to the fact that that the information above on your After Visit Summary has been reviewed and is understood.  Full responsibility of the confidentiality of this discharge information lies with you and/or your care-partner.

## 2019-10-06 NOTE — Op Note (Addendum)
[place] Patient Name: Carla Morales Procedure Date: 10/06/2019 3:22 PM MRN: [id number] Endoscopist: Jerene Bears , MD Age: 58 Referring MD:  Date of Birth: [birth year] Gender: Female Account #: [id number] Procedure:                Upper GI endoscopy Indications:              Epigastric abdominal pain, Dysphagia, Suspected                            gastro-esophageal reflux disease Medicines:                Monitored Anesthesia Care Procedure:                Pre-Anesthesia Assessment:                           - Prior to the procedure, a History and Physical                            was performed, and patient medications and                            allergies were reviewed. The patient's tolerance of                            previous anesthesia was also reviewed. The risks                            and benefits of the procedure and the sedation                            options and risks were discussed with the patient.                            All questions were answered, and informed consent                            was obtained. Prior Anticoagulants: The patient has                            taken no previous anticoagulant or antiplatelet                            agents. ASA Grade Assessment: II - A patient with                            mild systemic disease. After reviewing the risks                            and benefits, the patient was deemed in                            satisfactory condition to undergo the procedure.  After obtaining informed consent, the endoscope was                            passed under direct vision. Throughout the                            procedure, the patient's blood pressure, pulse, and                            oxygen saturations were monitored continuously. The                            Endoscope was introduced through the mouth, and                            advanced to the second part  of duodenum. The upper                            GI endoscopy was accomplished without difficulty.                            The patient tolerated the procedure well. Scope In: Scope Out: Findings:                 LA Grade A (one or more mucosal breaks less than 5                            mm, not extending between tops of 2 mucosal folds)                            esophagitis was found at the gastroesophageal                            junction.                           A non-obstructing and mild Schatzki ring was found                            at the gastroesophageal junction. A TTS dilator was                            passed through the scope. Dilation with a 16-17-18                            mm balloon dilator was performed to 18 mm. The                            dilation site was examined and showed mild mucosal                            disruption.  The entire examined stomach was normal.                           The examined duodenum was normal. Complications:            No immediate complications. Estimated Blood Loss:     Estimated blood loss: none. Impression:               - LA Grade A reflux esophagitis.                           - Non-obstructing and mild Schatzki ring. Dilated                            to 18 mm with balloon.                           - Normal stomach.                           - Normal examined duodenum.                           - No specimens collected. Recommendation:           - Patient has a contact number available for                            emergencies. The signs and symptoms of potential                            delayed complications were discussed with the                            patient. Return to normal activities tomorrow.                            Written discharge instructions were provided to the                            patient.                           - Resume previous diet.                            - Continue present medications. Continue recently                            started omeprazole 40 mg daily to help heal                            esophagitis.                           - Call my office if you continue to experience  upper abdominal pain, acid reflux symptoms or                            recurrent trouble swallowing. Jerene Bears, MD 10/06/2019 4:16:42 PM This report has been signed electronically.

## 2019-10-06 NOTE — Progress Notes (Signed)
A/ox3, pleased with MAC, report to RN 

## 2019-10-08 ENCOUNTER — Telehealth: Payer: Self-pay | Admitting: *Deleted

## 2019-10-08 NOTE — Telephone Encounter (Signed)
  Follow up Call-  Call back number 10/06/2019  Post procedure Call Back phone  # [phone]  Permission to leave phone message Yes  Some recent data might be hidden     Patient questions:  Do you have a fever, pain , or abdominal swelling? No. Pain Score  0 *  Have you tolerated food without any problems? Yes.    Have you been able to return to your normal activities? Yes.    Do you have any questions about your discharge instructions: Diet   No. Medications  No. Follow up visit  No.  Do you have questions or concerns about your Care? No.  Actions: * If pain score is 4 or above: No action needed, pain <4.  1. Have you developed a fever since your procedure? no  2.   Have you had an respiratory symptoms (SOB or cough) since your procedure? no  3.   Have you tested positive for COVID 19 since your procedure no  4.   Have you had any family members/close contacts diagnosed with the COVID 19 since your procedure?  no   If yes to any of these questions please route to Joylene John, RN and Alphonsa Gin, Therapist, sports.

## 2019-10-13 ENCOUNTER — Encounter: Payer: Self-pay | Admitting: Internal Medicine

## 2019-10-19 ENCOUNTER — Other Ambulatory Visit: Payer: Self-pay | Admitting: Family Medicine

## 2019-10-20 NOTE — Telephone Encounter (Signed)
[facility]akes. NTBS lipid & OV for Dx 10/09/18 mail order refill not sent

## 2019-10-20 NOTE — Telephone Encounter (Signed)
Appt scheduled and patient notifiied

## 2019-10-28 ENCOUNTER — Ambulatory Visit: Payer: BC Managed Care – PPO

## 2019-10-28 ENCOUNTER — Other Ambulatory Visit: Payer: Self-pay

## 2019-10-28 ENCOUNTER — Ambulatory Visit: Admission: RE | Admit: 2019-10-28 | Payer: BC Managed Care – PPO | Source: Ambulatory Visit

## 2019-10-28 ENCOUNTER — Ambulatory Visit
Admission: RE | Admit: 2019-10-28 | Discharge: 2019-10-28 | Disposition: A | Discharge status: Home / Self Care | Payer: BC Managed Care – PPO | Source: Ambulatory Visit | Attending: Gynecology | Admitting: Gynecology

## 2019-10-28 DIAGNOSIS — R922 Inconclusive mammogram: Secondary | ICD-10-CM | POA: Diagnosis not present

## 2019-10-28 DIAGNOSIS — N644 Mastodynia: Secondary | ICD-10-CM

## 2019-10-29 ENCOUNTER — Encounter: Payer: Self-pay | Admitting: Family

## 2019-10-29 ENCOUNTER — Ambulatory Visit (INDEPENDENT_AMBULATORY_CARE_PROVIDER_SITE_OTHER): Payer: BC Managed Care – PPO | Admitting: Family

## 2019-10-29 VITALS — BP 119/75 | HR 69 | Temp 97.8°F | Ht 66.0 in | Wt 196.4 lb

## 2019-10-29 DIAGNOSIS — Z6830 Body mass index (BMI) 30.0-30.9, adult: Secondary | ICD-10-CM

## 2019-10-29 DIAGNOSIS — Z0000 Encounter for general adult medical examination without abnormal findings: Secondary | ICD-10-CM

## 2019-10-29 DIAGNOSIS — E559 Vitamin D deficiency, unspecified: Secondary | ICD-10-CM

## 2019-10-29 DIAGNOSIS — Z0001 Encounter for general adult medical examination with abnormal findings: Secondary | ICD-10-CM | POA: Diagnosis not present

## 2019-10-29 DIAGNOSIS — Z23 Encounter for immunization: Secondary | ICD-10-CM

## 2019-10-29 DIAGNOSIS — K219 Gastro-esophageal reflux disease without esophagitis: Secondary | ICD-10-CM

## 2019-10-29 DIAGNOSIS — Z780 Asymptomatic menopausal state: Secondary | ICD-10-CM

## 2019-10-29 DIAGNOSIS — Z114 Encounter for screening for human immunodeficiency virus [HIV]: Secondary | ICD-10-CM

## 2019-10-29 DIAGNOSIS — E7800 Pure hypercholesterolemia, unspecified: Secondary | ICD-10-CM | POA: Diagnosis not present

## 2019-10-29 DIAGNOSIS — Z79899 Other long term (current) drug therapy: Secondary | ICD-10-CM

## 2019-10-29 DIAGNOSIS — F411 Generalized anxiety disorder: Secondary | ICD-10-CM

## 2019-10-29 DIAGNOSIS — Z1159 Encounter for screening for other viral diseases: Secondary | ICD-10-CM

## 2019-10-29 MED ORDER — ALPRAZOLAM 0.5 MG PO TABS: 0.5000 mg | ORAL_TABLET | Freq: Every evening | ORAL | 2 refills | Status: DC | PRN

## 2019-10-29 MED ORDER — VITAMIN D (ERGOCALCIFEROL) 1.25 MG (50000 UNIT) PO CAPS: ORAL_CAPSULE | ORAL | 1 refills | Status: DC

## 2019-10-29 NOTE — Patient Instructions (Signed)
Health Maintenance, Female Adopting a healthy lifestyle and getting preventive care are important in promoting health and wellness. Ask your health care provider about:  The right schedule for you to have regular tests and exams.  Things you can do on your own to prevent diseases and keep yourself healthy. What should I know about diet, weight, and exercise? Eat a healthy diet   Eat a diet that includes plenty of vegetables, fruits, low-fat dairy products, and lean protein.  Do not eat a lot of foods that are high in solid fats, added sugars, or sodium. Maintain a healthy weight Body mass index (BMI) is used to identify weight problems. It estimates body fat based on height and weight. Your health care provider can help determine your BMI and help you achieve or maintain a healthy weight. Get regular exercise Get regular exercise. This is one of the most important things you can do for your health. Most adults should:  Exercise for at least 150 minutes each week. The exercise should increase your heart rate and make you sweat (moderate-intensity exercise).  Do strengthening exercises at least twice a week. This is in addition to the moderate-intensity exercise.  Spend less time sitting. Even light physical activity can be beneficial. Watch cholesterol and blood lipids Have your blood tested for lipids and cholesterol at 56 years of age, then have this test every 5 years. Have your cholesterol levels checked more often if:  Your lipid or cholesterol levels are high.  You are older than 56 years of age.  You are at high risk for heart disease. What should I know about cancer screening? Depending on your health history and family history, you may need to have cancer screening at various ages. This may include screening for:  Breast cancer.  Cervical cancer.  Colorectal cancer.  Skin cancer.  Lung cancer. What should I know about heart disease, diabetes, and high blood  pressure? Blood pressure and heart disease  High blood pressure causes heart disease and increases the risk of stroke. This is more likely to develop in people who have high blood pressure readings, are of African descent, or are overweight.  Have your blood pressure checked: ? Every 3-5 years if you are 18-39 years of age. ? Every year if you are 40 years old or older. Diabetes Have regular diabetes screenings. This checks your fasting blood sugar level. Have the screening done:  Once every three years after age 40 if you are at a normal weight and have a low risk for diabetes.  More often and at a younger age if you are overweight or have a high risk for diabetes. What should I know about preventing infection? Hepatitis B If you have a higher risk for hepatitis B, you should be screened for this virus. Talk with your health care provider to find out if you are at risk for hepatitis B infection. Hepatitis C Testing is recommended for:  Everyone born from 1945 through 1965.  Anyone with known risk factors for hepatitis C. Sexually transmitted infections (STIs)  Get screened for STIs, including gonorrhea and chlamydia, if: ? You are sexually active and are younger than 56 years of age. ? You are older than 56 years of age and your health care provider tells you that you are at risk for this type of infection. ? Your sexual activity has changed since you were last screened, and you are at increased risk for chlamydia or gonorrhea. Ask your health care provider if   you are at risk.  Ask your health care provider about whether you are at high risk for HIV. Your health care provider may recommend a prescription medicine to help prevent HIV infection. If you choose to take medicine to prevent HIV, you should first get tested for HIV. You should then be tested every 3 months for as long as you are taking the medicine. Pregnancy  If you are about to stop having your period (premenopausal) and  you may become pregnant, seek counseling before you get pregnant.  Take 400 to 800 micrograms (mcg) of folic acid every day if you become pregnant.  Ask for birth control (contraception) if you want to prevent pregnancy. Osteoporosis and menopause Osteoporosis is a disease in which the bones lose minerals and strength with aging. This can result in bone fractures. If you are 65 years old or older, or if you are at risk for osteoporosis and fractures, ask your health care provider if you should:  Be screened for bone loss.  Take a calcium or vitamin D supplement to lower your risk of fractures.  Be given hormone replacement therapy (HRT) to treat symptoms of menopause. Follow these instructions at home: Lifestyle  Do not use any products that contain nicotine or tobacco, such as cigarettes, e-cigarettes, and chewing tobacco. If you need help quitting, ask your health care provider.  Do not use street drugs.  Do not share needles.  Ask your health care provider for help if you need support or information about quitting drugs. Alcohol use  Do not drink alcohol if: ? Your health care provider tells you not to drink. ? You are pregnant, may be pregnant, or are planning to become pregnant.  If you drink alcohol: ? Limit how much you use to 0-1 drink a day. ? Limit intake if you are breastfeeding.  Be aware of how much alcohol is in your drink. In the [place] one drink equals one 12 oz bottle of beer (355 mL), one 5 oz glass of wine (148 mL), or one 1 oz glass of hard liquor (44 mL). General instructions  Schedule regular health, dental, and eye exams.  Stay current with your vaccines.  Tell your health care provider if: ? You often feel depressed. ? You have ever been abused or do not feel safe at home. Summary  Adopting a healthy lifestyle and getting preventive care are important in promoting health and wellness.  Follow your health care provider's instructions about healthy  diet, exercising, and getting tested or screened for diseases.  Follow your health care provider's instructions on monitoring your cholesterol and blood pressure. This information is not intended to replace advice given to you by your health care provider. Make sure you discuss any questions you have with your health care provider. Document Released: 05/15/2011 Document Revised: 10/23/2018 Document Reviewed: 10/23/2018 Elsevier Patient Education  2020 Else[employer] Inc[employer]  

## 2019-10-29 NOTE — Progress Notes (Signed)
Subjective:    Patient ID: Carla Morales, female    DOB: 16-Dec-1960, 59 y.o.   MRN: [id number]  Chief Complaint  Patient presents with  . Medical Management of Chronic Issues   Pt presents to the office today for CPE without pap. She is followed by GYN annually. She is requesting a refill of her xanax. She states her mother passed away last year the day after Christmas. She was given one prescription of xanax and still has that same prescrition.  Hyperlipidemia This is a chronic problem. The current episode started more than 1 year ago. The problem is controlled. Recent lipid tests were reviewed and are normal. Exacerbating diseases include obesity. Current antihyperlipidemic treatment includes statins. The current treatment provides moderate improvement of lipids. Risk factors for coronary artery disease include dyslipidemia, hypertension, post-menopausal and a sedentary lifestyle.  Anxiety Presents for follow-up visit. Symptoms include depressed mood, excessive worry, irritability, nervous/anxious behavior and restlessness. Symptoms occur most days. The severity of symptoms is moderate.    Gastroesophageal Reflux She complains of belching and heartburn. This is a chronic problem. The current episode started more than 1 year ago. The problem occurs occasionally. She has tried a PPI for the symptoms. The treatment provided moderate relief.      Review of Systems  Constitutional: Positive for irritability.  Gastrointestinal: Positive for heartburn.  Psychiatric/Behavioral: The patient is nervous/anxious.   All other systems reviewed and are negative.  Family History  Problem Relation Age of Onset  . Hypertension Father   . Melanoma Father 37  . Rheum arthritis Mother   . Fibromyalgia Mother   . Cancer Mother        breast, uterine, adenocarcinoma-right lung  . Uterine cancer Mother        Sarcoma  . Lung cancer Mother        Adenocarcinoma  . Breast cancer Mother 20  .  Other Daughter        Phyllodes tumor  . Diabetes Maternal Aunt   . Diabetes Maternal Uncle   . Diabetes Paternal Aunt   . Breast cancer Paternal Aunt 83  . Diabetes Paternal Uncle   . Heart disease Maternal Grandfather   . Asthma Sister   . Other Maternal Grandmother 30       childbirth  . Cancer Paternal Grandmother        unknown  . Cancer Maternal Aunt 70       GYN cancer - possibly vulvar?  . Appendicitis Maternal Uncle        d. 12  . Breast cancer Paternal Aunt 34  . Uterine cancer Paternal Aunt 24  . Breast cancer Paternal Aunt 67  . Colon cancer Neg Hx   . Esophageal cancer Neg Hx   . Ulcerative colitis Neg Hx   . Stomach cancer Neg Hx    Social History   Socioeconomic History  . Marital status: Married    Spouse name: Not on file  . Number of children: Not on file  . Years of education: Not on file  . Highest education level: Not on file  Occupational History  . Not on file  Tobacco Use  . Smoking status: Never Smoker  . Smokeless tobacco: Never Used  Substance and Sexual Activity  . Alcohol use: No    Alcohol/week: 0.0 standard drinks  . Drug use: No  . Sexual activity: Yes    Partners: Male    Birth control/protection: Surgical    Comment: hyst.-1st intercourse  37 yo-1 partner  Other Topics Concern  . Not on file  Social History Narrative  . Not on file   Social Determinants of Health   Financial Resource Strain:   . Difficulty of Paying Living Expenses: Not on file  Food Insecurity:   . Worried About Charity fundraiser in the Last Year: Not on file  . Ran Out of Food in the Last Year: Not on file  Transportation Needs:   . Lack of Transportation (Medical): Not on file  . Lack of Transportation (Non-Medical): Not on file  Physical Activity:   . Days of Exercise per Week: Not on file  . Minutes of Exercise per Session: Not on file  Stress:   . Feeling of Stress : Not on file  Social Connections:   . Frequency of Communication with Friends  and Family: Not on file  . Frequency of Social Gatherings with Friends and Family: Not on file  . Attends Religious Services: Not on file  . Active Member of Clubs or Organizations: Not on file  . Attends Archivist Meetings: Not on file  . Marital Status: Not on file       Objective:   Physical Exam Vitals reviewed.  Constitutional:      General: She is not in acute distress.    Appearance: She is well-developed.  HENT:     Head: Normocephalic and atraumatic.     Right Ear: Tympanic membrane normal.     Left Ear: Tympanic membrane normal.  Eyes:     Pupils: Pupils are equal, round, and reactive to light.  Neck:     Thyroid: No thyromegaly.  Cardiovascular:     Rate and Rhythm: Normal rate and regular rhythm.     Heart sounds: Normal heart sounds. No murmur.  Pulmonary:     Effort: Pulmonary effort is normal. No respiratory distress.     Breath sounds: Normal breath sounds. No wheezing.  Abdominal:     General: Bowel sounds are normal. There is no distension.     Palpations: Abdomen is soft.     Tenderness: There is no abdominal tenderness.  Musculoskeletal:        General: No tenderness. Normal range of motion.     Cervical back: Normal range of motion and neck supple.  Skin:    General: Skin is warm and dry.  Neurological:     Mental Status: She is alert and oriented to person, place, and time.     Cranial Nerves: No cranial nerve deficit.     Deep Tendon Reflexes: Reflexes are normal and symmetric.  Psychiatric:        Behavior: Behavior normal.        Thought Content: Thought content normal.        Judgment: Judgment normal.          BP 119/75   Pulse 69   Temp 97.8 F (36.6 C) (Temporal)   Ht '5\' 6"'$  (1.676 m)   Wt 196 lb 6.4 oz (89.1 kg)   LMP 07/24/2010   SpO2 98%   BMI 31.70 kg/m   Assessment & Plan:  Carla Morales comes in today with chief complaint of Medical Management of Chronic Issues   Diagnosis and orders  addressed:  1. Pure hypercholesterolemia - CMP14+EGFR - CBC with Differential/Platelet  2. BMI 30.0-30.9,adult - CMP14+EGFR - CBC with Differential/Platelet  3. Gastroesophageal reflux disease, unspecified whether esophagitis present - CMP14+EGFR - CBC with Differential/Platelet  4. Vitamin  D deficiency - CMP14+EGFR - CBC with Differential/Platelet - Vitamin D 25 hydroxy  5. Annual physical exam - CMP14+EGFR - CBC with Differential/Platelet - TSH - Lipid panel - Hepatitis C antibody - HIV antibody  6. Need for hepatitis C screening test - CMP14+EGFR - CBC with Differential/Platelet - Hepatitis C antibody  7. Encounter for screening for HIV - CMP14+EGFR - CBC with Differential/Platelet - HIV antibody  8. GAD (generalized anxiety disorder) - ALPRAZolam (XANAX) 0.5 MG tablet; Take 1 tablet (0.5 mg total) by mouth at bedtime as needed for anxiety.  Dispense: 20 tablet; Refill: 2  9. Controlled substance agreement signed - ALPRAZolam (XANAX) 0.5 MG tablet; Take 1 tablet (0.5 mg total) by mouth at bedtime as needed for anxiety.  Dispense: 20 tablet; Refill: 2  10. Post-menopausal - DG [facility] DEXA  11. Need for immunization against influenza - Flu Vaccine QUAD 36+ mos IM  PT reviewed in [place] controlled database- No red flags noted Labs pending Health Maintenance reviewed Diet and exercise encouraged  Follow up plan: 6 months    [name], FNP

## 2019-11-11 ENCOUNTER — Telehealth: Payer: Self-pay | Admitting: *Deleted

## 2019-11-11 NOTE — Telephone Encounter (Signed)
Patient requesting lab results.  Chart review in lab section, reveals waiting to draw blood on patient but patient says she had blood drawn on that day.  Personal in lab are reviewing and will get back with nurse concerning outcome.

## 2019-11-12 ENCOUNTER — Other Ambulatory Visit: Payer: Self-pay

## 2019-11-12 ENCOUNTER — Other Ambulatory Visit: Payer: BC Managed Care – PPO

## 2019-11-12 DIAGNOSIS — K219 Gastro-esophageal reflux disease without esophagitis: Secondary | ICD-10-CM | POA: Diagnosis not present

## 2019-11-12 DIAGNOSIS — E559 Vitamin D deficiency, unspecified: Secondary | ICD-10-CM | POA: Diagnosis not present

## 2019-11-12 DIAGNOSIS — Z6830 Body mass index (BMI) 30.0-30.9, adult: Secondary | ICD-10-CM | POA: Diagnosis not present

## 2019-11-12 DIAGNOSIS — E7800 Pure hypercholesterolemia, unspecified: Secondary | ICD-10-CM | POA: Diagnosis not present

## 2019-11-12 DIAGNOSIS — Z0000 Encounter for general adult medical examination without abnormal findings: Secondary | ICD-10-CM | POA: Diagnosis not present

## 2019-11-13 LAB — CBC WITH DIFFERENTIAL/PLATELET
Basophils Absolute: 0.1 10*3/uL (ref 0.0–0.2)
Basos: 1 %
EOS (ABSOLUTE): 0.2 10*3/uL (ref 0.0–0.4)
Eos: 2 %
Hematocrit: 39.4 % (ref 34.0–46.6)
Hemoglobin: 13.5 g/dL (ref 11.1–15.9)
Immature Grans (Abs): 0 10*3/uL (ref 0.0–0.1)
Immature Granulocytes: 0 %
Lymphocytes Absolute: 2.1 10*3/uL (ref 0.7–3.1)
Lymphs: 27 %
MCH: 30.5 pg (ref 26.6–33.0)
MCHC: 34.3 g/dL (ref 31.5–35.7)
MCV: 89 fL (ref 79–97)
Monocytes Absolute: 0.6 10*3/uL (ref 0.1–0.9)
Monocytes: 7 %
Neutrophils Absolute: 4.9 10*3/uL (ref 1.4–7.0)
Neutrophils: 63 %
Platelets: 270 10*3/uL (ref 150–450)
RBC: 4.43 x10E6/uL (ref 3.77–5.28)
RDW: 12.9 % (ref 11.7–15.4)
WBC: 7.8 10*3/uL (ref 3.4–10.8)

## 2019-11-13 LAB — CMP14+EGFR
ALT: 17 IU/L (ref 0–32)
AST: 17 IU/L (ref 0–40)
Albumin/Globulin Ratio: 1.4 (ref 1.2–2.2)
Albumin: 4.2 g/dL (ref 3.8–4.9)
Alkaline Phosphatase: 126 IU/L — ABNORMAL HIGH (ref 39–117)
BUN/Creatinine Ratio: 14 (ref 9–23)
BUN: 12 mg/dL (ref 6–24)
Bilirubin Total: 0.6 mg/dL (ref 0.0–1.2)
CO2: 22 mmol/L (ref 20–29)
Calcium: 9.2 mg/dL (ref 8.7–10.2)
Chloride: 103 mmol/L (ref 96–106)
Creatinine, Ser: 0.83 mg/dL (ref 0.57–1.00)
GFR calc Af Amer: 91 mL/min/{1.73_m2} (ref >59)
GFR calc non Af Amer: 79 mL/min/{1.73_m2} (ref >59)
Globulin, Total: 2.9 g/dL (ref 1.5–4.5)
Glucose: 103 mg/dL — ABNORMAL HIGH (ref 65–99)
Potassium: 3.9 mmol/L (ref 3.5–5.2)
Sodium: 139 mmol/L (ref 134–144)
Total Protein: 7.1 g/dL (ref 6.0–8.5)

## 2019-11-13 LAB — LIPID PANEL
Chol/HDL Ratio: 3 ratio (ref 0.0–4.4)
Cholesterol, Total: 158 mg/dL (ref 100–199)
HDL: 52 mg/dL (ref >39)
LDL Chol Calc (NIH): 77 mg/dL (ref 0–99)
Triglycerides: 174 mg/dL — ABNORMAL HIGH (ref 0–149)
VLDL Cholesterol Cal: 29 mg/dL (ref 5–40)

## 2019-11-13 LAB — HIV ANTIBODY (ROUTINE TESTING W REFLEX): HIV Screen 4th Generation wRfx: NONREACTIVE

## 2019-11-13 LAB — TSH: TSH: 1.97 u[IU]/mL (ref 0.450–4.500)

## 2019-11-13 LAB — VITAMIN D 25 HYDROXY (VIT D DEFICIENCY, FRACTURES): Vit D, 25-Hydroxy: 52.2 ng/mL (ref 30.0–100.0)

## 2019-11-13 LAB — HEPATITIS C ANTIBODY: Hep C Virus Ab: 0.1 s/co ratio (ref 0.0–0.9)

## 2019-11-17 ENCOUNTER — Telehealth: Payer: Self-pay | Admitting: Family

## 2019-11-17 NOTE — Telephone Encounter (Signed)
Pt called and said that she received a missed call from [facility]. Requested to be called back.

## 2019-11-17 NOTE — Telephone Encounter (Signed)
Spoke to pt regarding test results-see result notes, will close encounter.

## 2019-11-17 NOTE — Telephone Encounter (Signed)
Returning [employer]	[place] phone call. Requested to be called back.

## 2019-11-19 ENCOUNTER — Other Ambulatory Visit: Payer: Self-pay

## 2019-11-19 LAB — GAMMA GT: GGT: 52 IU/L (ref 0–60)

## 2019-11-19 LAB — SPECIMEN STATUS REPORT

## 2019-11-19 MED ORDER — ESTRADIOL 0.075 MG/24HR TD PTTW: MEDICATED_PATCH | TRANSDERMAL | 3 refills | Status: DC

## 2020-01-03 ENCOUNTER — Other Ambulatory Visit: Payer: Self-pay | Admitting: Family Medicine

## 2020-01-03 DIAGNOSIS — J301 Allergic rhinitis due to pollen: Secondary | ICD-10-CM

## 2020-01-04 ENCOUNTER — Other Ambulatory Visit: Payer: Self-pay | Admitting: Physician Assistant

## 2020-01-21 DIAGNOSIS — D225 Melanocytic nevi of trunk: Secondary | ICD-10-CM | POA: Diagnosis not present

## 2020-01-21 DIAGNOSIS — L298 Other pruritus: Secondary | ICD-10-CM | POA: Diagnosis not present

## 2020-01-21 DIAGNOSIS — L503 Dermatographic urticaria: Secondary | ICD-10-CM | POA: Diagnosis not present

## 2020-01-21 DIAGNOSIS — Z1283 Encounter for screening for malignant neoplasm of skin: Secondary | ICD-10-CM | POA: Diagnosis not present

## 2020-01-26 ENCOUNTER — Telehealth: Payer: Self-pay | Admitting: Family

## 2020-01-26 ENCOUNTER — Encounter: Payer: Self-pay | Admitting: *Deleted

## 2020-01-26 NOTE — Telephone Encounter (Signed)
Left message , do not see ANA lab test,  to please call our office for further questions.

## 2020-01-28 ENCOUNTER — Encounter: Payer: Self-pay | Admitting: *Deleted

## 2020-01-29 ENCOUNTER — Encounter: Payer: Self-pay | Admitting: Physician Assistant

## 2020-01-29 ENCOUNTER — Other Ambulatory Visit (INDEPENDENT_AMBULATORY_CARE_PROVIDER_SITE_OTHER): Payer: BC Managed Care – PPO

## 2020-01-29 ENCOUNTER — Ambulatory Visit: Payer: BC Managed Care – PPO | Admitting: Physician Assistant

## 2020-01-29 VITALS — BP 110/70 | HR 76 | Temp 98.4°F | Ht 65.0 in | Wt 194.5 lb

## 2020-01-29 DIAGNOSIS — K219 Gastro-esophageal reflux disease without esophagitis: Secondary | ICD-10-CM | POA: Diagnosis not present

## 2020-01-29 DIAGNOSIS — R7989 Other specified abnormal findings of blood chemistry: Secondary | ICD-10-CM

## 2020-01-29 LAB — GAMMA GT: GGT: 67 U/L — ABNORMAL HIGH (ref 7–51)

## 2020-01-29 LAB — AMMONIA: Ammonia: 34 umol/L (ref 11–35)

## 2020-01-29 MED ORDER — FAMOTIDINE 40 MG PO TABS: 40.0000 mg | ORAL_TABLET | Freq: Every morning | ORAL | 11 refills | Status: DC

## 2020-01-29 NOTE — Patient Instructions (Signed)
You have been scheduled for an abdominal ultrasound at [facility] (1st floor of hospital) on Thursday 02/05/20 at 8:00 am. Please arrive 15 minutes prior to your appointment for registration. Make certain not to have anything to eat or drink 6 hours prior to your appointment. Should you need to reschedule your appointment, please contact radiology at [phone]. This test typically takes about 30 minutes to perform. ____________________________________________________ Your provider has requested that you go to the basement level for lab work before leaving today. Press "B" on the elevator. The lab is located at the first door on the left as you exit the elevator.  ____________________________________________________ We have sent the following medications to your pharmacy for you to pick up at your convenience: Pepcid 40 mg every morning.  ____________________________________________________ If you are age 58 or older, your body mass index should be between 23-30. Your Body mass index is 32.37 kg/m. If this is out of the aforementioned range listed, please consider follow up with your Primary Care Provider.  If you are age 51 or younger, your body mass index should be between 19-25. Your Body mass index is 32.37 kg/m. If this is out of the aformentioned range listed, please consider follow up with your Primary Care Provider.   _____________________________________________________ Due to recent changes in healthcare laws, you may see the results of your imaging and laboratory studies on MyChart before your provider has had a chance to review them.  We understand that in some cases there may be results that are confusing or concerning to you. Not all laboratory results come back in the same time frame and the provider may be waiting for multiple results in order to interpret others.  Please give [place] 48 hours in order for your provider to thoroughly review all the results before contacting the  office for clarification of your results.

## 2020-01-29 NOTE — Progress Notes (Signed)
Subjective:    Patient ID: Carla Morales, female    DOB: [birth year], 59 y.o.   MRN: [id number]  HPI Carla Morales is a pleasant 59 year old white female, established with Dr. Hilarie Fredrickson.  She was last seen here in November 2020 with complaints of dysphagia and subsequently underwent EGD and colonoscopy in November 2020.  EGD showed grade a esophagitis and a distal stricture which was balloon dilated to 18 mm.  At colonoscopy she had 6 polyps removed and by biopsy all were tubular adenomas, also noted to have internal hemorrhoids and diverticulosis. She comes in today because of recent abnormal labs done through dermatology/Dr. Nevada Crane. Patient relates that she had COVID-19 in September 2020 and shortly after recovering from Covid started noticing intermittent itching which would be in random places, without rash or obvious lesions.  This seems to be worse at night.  Symptoms persisted and became a bit more prevalent and she underwent work-up. As far as recovery from Covid she says she is still experiencing fatigue and gets short of breath with activity.  Appetite has been fine, weight has been stable.  She continues to have intermittent reflux symptoms.  She had been on omeprazole for a period of time without any change in her symptoms so she stopped it. Labs done March 2021 showed normal CBC, LFTs with alk phos of 133/AST 21/ALT 27.  Reviewing she also had mildly elevated alk phos in December 2020 at 131, GGT was normal at that time at 52, hep C antibody negative.  Hepatic panel November 2019 alk phos 131 ANA positive at 1-3 20/homogenous, other autoimmune panel per dermatology including Sjogren's anti-SSA and SSB, Smith antibodies, anti-DNA antibody RNP antibodies all negative. She has been referred to rheumatology and is awaiting appointment./Dr. Amil Amen.  She has no prior history of liver disease , no family history of liver disease.  Review of Systems Pertinent positive and negative review of systems  were noted in the above HPI section.  All other review of systems was otherwise negative.  Outpatient Encounter Medications as of 01/29/2020  Medication Sig  . ALPRAZolam (XANAX) 0.5 MG tablet Take 1 tablet (0.5 mg total) by mouth at bedtime as needed for anxiety.  Marland Kitchen atorvastatin (LIPITOR) 10 MG tablet Take 1 tablet (10 mg total) by mouth daily.  . diphenhydrAMINE (BENADRYL) 12.5 MG/5ML liquid Take by mouth at bedtime as needed.  Marland Kitchen estradiol (VIVELLE-DOT) 0.075 MG/24HR UNWRAP AND APPLY 1 PATCH TO SKIN 2 TIMES PER WEEK.  . fluticasone (FLONASE) 50 MCG/ACT nasal spray USE 2 SPRAYS IN EACH NOSTRIL DAILY GENERIC EQUIVALENT FOR FLONASE  . Krill Oil 300 MG CAPS Take by mouth.  . loratadine (CLARITIN) 10 MG tablet Take 10 mg by mouth daily.  . meloxicam (MOBIC) 15 MG tablet Take 1 tablet (15 mg total) by mouth daily.  . VENTOLIN HFA 108 (90 Base) MCG/ACT inhaler TAKE 2 PUFFS BY MOUTH EVERY 6 HOURS AS NEEDED FOR WHEEZE OR SHORTNESS OF BREATH  . Vitamin D, Ergocalciferol, (DRISDOL) 1.25 MG (50000 UT) CAPS capsule TAKE 1 CAPSULE BY MOUTH EVERY 7 DAYS GENERIC EQUIVALENT FOR DRISDOL  . famotidine (PEPCID) 40 MG tablet Take 1 tablet (40 mg total) by mouth in the morning.  . [DISCONTINUED] omeprazole (PRILOSEC) 40 MG capsule TAKE 1 CAPSULE BY MOUTH EVERY DAY   No facility-administered encounter medications on file as of 01/29/2020.   Allergies  Allergen Reactions  . Other Swelling    Contrast dye  . Contrast Media [Iodinated Diagnostic Agents]   .  Haldol [Haloperidol Decanoate]     anxiety  . Pravastatin Other (See Comments)    myalgias  . Sulfa Antibiotics Rash   Patient Active Problem List   Diagnosis Date Noted  . GERD (gastroesophageal reflux disease) 10/29/2019  . Vitamin D deficiency 10/29/2019  . Genetic testing 06/19/2018  . Family history of breast cancer   . Family history of uterine cancer   . Family history of lung cancer   . Family history of melanoma   . Obesity (BMI 30-39.9)  03/29/2018  . Bronchospasm 11/27/2013  . Allergy   . Hyperlipidemia   . Migraine   . Pinched nerve in neck    Social History   Socioeconomic History  . Marital status: Married    Spouse name: Not on file  . Number of children: Not on file  . Years of education: Not on file  . Highest education level: Not on file  Occupational History  . Not on file  Tobacco Use  . Smoking status: Never Smoker  . Smokeless tobacco: Never Used  Substance and Sexual Activity  . Alcohol use: No    Alcohol/week: 0.0 standard drinks  . Drug use: No  . Sexual activity: Yes    Partners: Male    Birth control/protection: Surgical    Comment: hyst.-1st intercourse 59 yo-1 partner  Other Topics Concern  . Not on file  Social History Narrative  . Not on file   Social Determinants of Health   Financial Resource Strain:   . Difficulty of Paying Living Expenses:   Food Insecurity:   . Worried About Charity fundraiser in the Last Year:   . Arboriculturist in the Last Year:   Transportation Needs:   . Film/video editor (Medical):   Marland Kitchen Lack of Transportation (Non-Medical):   Physical Activity:   . Days of Exercise per Week:   . Minutes of Exercise per Session:   Stress:   . Feeling of Stress :   Social Connections:   . Frequency of Communication with Friends and Family:   . Frequency of Social Gatherings with Friends and Family:   . Attends Religious Services:   . Active Member of Clubs or Organizations:   . Attends Archivist Meetings:   Marland Kitchen Marital Status:   Intimate Partner Violence:   . Fear of Current or Ex-Partner:   . Emotionally Abused:   Marland Kitchen Physically Abused:   . Sexually Abused:     Ms. Sida family history includes Appendicitis in her maternal uncle; Asthma in her sister; Breast cancer (age of onset: 22) in her paternal aunt; Breast cancer (age of onset: 101) in her paternal aunt; Breast cancer (age of onset: 89) in her mother and paternal aunt; Cancer in her  mother and paternal grandmother; Cancer (age of onset: 66) in her maternal aunt; Diabetes in her maternal aunt, maternal uncle, paternal aunt, and paternal uncle; Fibromyalgia in her mother; Berenice Primas' disease in her nephew; Heart disease in her maternal grandfather; Hypertension in her father; Lung cancer in her mother; Melanoma (age of onset: 23) in her father; Other in her daughter; Other (age of onset: 47) in her maternal grandmother; Rheum arthritis in her mother; Uterine cancer in her mother; Uterine cancer (age of onset: 22) in her paternal aunt.      Objective:    Vitals:   01/29/20 0852  BP: 110/70  Pulse: 76  Temp: 98.4 F (36.9 C)    Physical Exam Well-developed well-nourished white  female in no acute distress.  Height, [id number], BMI 32.3  HEENT; nontraumatic normocephalic, EOMI, PER R LA, sclera anicteric. Oropharynx; not examined Neck; supple, no JVD Cardiovascular; regular rate and rhythm with S1-S2, no murmur rub or gallop Pulmonary; Clear bilaterally Abdomen; soft, nontender, nondistended, no palpable mass or hepatosplenomegaly, bowel sounds are active Rectal;not done Skin; benign exam, no jaundice rash or appreciable lesions Extremities; no clubbing cyanosis or edema skin warm and dry Neuro/Psych; alert and oriented x4, grossly nonfocal mood and affect appropriate       Assessment & Plan:   #52 59 year old female status post COVID-02 August 2019 with onset of intermittent pruritus after recovery which persisted. Work-up per dermatology has revealed positive ANA at 1-3 20 homogenous, and also noted to have persistently elevated alkaline phosphatase.  Patient continues to have other symptoms post Covid including fatigue and exertional dyspnea.  It is unclear whether the elevated alkaline phosphatase is of hepatic origin, versus bone. Transaminases have been normal, so autoimmune hepatitis type picture unlikely.  #2 GERD-stable status post EGD dilation November  2020-not currently on PPI #3 several adenomatous colon polyps on colonoscopy November 2020, indicated for 3-year interval follow-up  Plan; We will schedule upper abdominal ultrasound Repeat GGT and check 5 prime nucleotidase We will also check antimitochondrial antibody and smooth muscle antibody Discussed management of persistent intermittent reflux symptoms, will start Pepcid 40 mg p.o. every morning.  Wilfred Siverson Genia Harold PA-C 01/29/2020   Cc: Sharion Balloon, FNP

## 2020-01-31 NOTE — Progress Notes (Signed)
Addendum: Reviewed and agree with assessment and management plan. Glyn Zendejas M, MD  

## 2020-02-03 LAB — NUCLEOTIDASE, 5', BLOOD: 5-Nucleotidase: 6 U/L (ref 0–10)

## 2020-02-03 LAB — MITOCHONDRIAL ANTIBODIES: Mitochondrial M2 Ab, IgG: <=20 U

## 2020-02-03 LAB — ANTI-SMOOTH MUSCLE ANTIBODY, IGG: Actin (Smooth Muscle) Antibody (IGG): <20 U (ref <20)

## 2020-02-05 ENCOUNTER — Ambulatory Visit (HOSPITAL_COMMUNITY)
Admission: RE | Admit: 2020-02-05 | Discharge: 2020-02-05 | Disposition: A | Discharge status: Home / Self Care | Payer: BC Managed Care – PPO | Source: Ambulatory Visit | Attending: Physician Assistant | Admitting: Physician Assistant

## 2020-02-05 ENCOUNTER — Other Ambulatory Visit: Payer: Self-pay

## 2020-02-05 DIAGNOSIS — K8020 Calculus of gallbladder without cholecystitis without obstruction: Secondary | ICD-10-CM | POA: Diagnosis not present

## 2020-02-05 DIAGNOSIS — R7989 Other specified abnormal findings of blood chemistry: Secondary | ICD-10-CM

## 2020-02-10 ENCOUNTER — Other Ambulatory Visit: Payer: Self-pay | Admitting: Family Medicine

## 2020-02-10 DIAGNOSIS — R0981 Nasal congestion: Secondary | ICD-10-CM

## 2020-02-11 DIAGNOSIS — M2550 Pain in unspecified joint: Secondary | ICD-10-CM | POA: Diagnosis not present

## 2020-02-11 DIAGNOSIS — R768 Other specified abnormal immunological findings in serum: Secondary | ICD-10-CM | POA: Diagnosis not present

## 2020-02-12 ENCOUNTER — Telehealth: Payer: Self-pay | Admitting: Physician Assistant

## 2020-02-12 NOTE — Telephone Encounter (Signed)
Patient has received her imaging report. She states she actually has been dealing off and on symptoms of indigestion, night time awakenings with abdominal pain, and pain between her shoulder blades with her indigestion. Relates a family history of gallbladder issues. She is interested in pursuing the surgical referral. Records and referral request faxed to [facility]. Requesting an appointment for consideration of cholecystectomy from Drs Donne Hazel, Harlow Asa or Lucia Gaskins.

## 2020-03-05 ENCOUNTER — Other Ambulatory Visit: Payer: Self-pay | Admitting: General Surgery

## 2020-03-05 DIAGNOSIS — K8020 Calculus of gallbladder without cholecystitis without obstruction: Secondary | ICD-10-CM | POA: Diagnosis not present

## 2020-03-17 ENCOUNTER — Ambulatory Visit: Payer: BC Managed Care – PPO | Admitting: Internal Medicine

## 2020-03-24 MED ORDER — ATORVASTATIN CALCIUM 10 MG PO TABS: 10.0000 mg | ORAL_TABLET | Freq: Every day | ORAL | 0 refills | Status: DC

## 2020-04-02 ENCOUNTER — Other Ambulatory Visit: Payer: Self-pay | Admitting: General Surgery

## 2020-04-02 DIAGNOSIS — K8012 Calculus of gallbladder with acute and chronic cholecystitis without obstruction: Secondary | ICD-10-CM | POA: Diagnosis not present

## 2020-04-05 DIAGNOSIS — K8010 Calculus of gallbladder with chronic cholecystitis without obstruction: Secondary | ICD-10-CM | POA: Diagnosis not present

## 2020-04-05 DIAGNOSIS — K812 Acute cholecystitis with chronic cholecystitis: Secondary | ICD-10-CM | POA: Diagnosis not present

## 2020-04-15 ENCOUNTER — Other Ambulatory Visit: Payer: Self-pay | Admitting: Family

## 2020-04-27 ENCOUNTER — Other Ambulatory Visit: Payer: BC Managed Care – PPO

## 2020-04-27 ENCOUNTER — Encounter: Payer: Self-pay | Admitting: Family

## 2020-04-27 ENCOUNTER — Other Ambulatory Visit: Payer: Self-pay

## 2020-04-27 ENCOUNTER — Ambulatory Visit: Payer: BC Managed Care – PPO | Admitting: Family

## 2020-04-27 VITALS — BP 106/68 | HR 82 | Temp 97.8°F | Ht 65.0 in | Wt 191.6 lb

## 2020-04-27 DIAGNOSIS — K219 Gastro-esophageal reflux disease without esophagitis: Secondary | ICD-10-CM

## 2020-04-27 DIAGNOSIS — Z0001 Encounter for general adult medical examination with abnormal findings: Secondary | ICD-10-CM | POA: Diagnosis not present

## 2020-04-27 DIAGNOSIS — E7800 Pure hypercholesterolemia, unspecified: Secondary | ICD-10-CM | POA: Diagnosis not present

## 2020-04-27 DIAGNOSIS — Z0000 Encounter for general adult medical examination without abnormal findings: Secondary | ICD-10-CM | POA: Diagnosis not present

## 2020-04-27 DIAGNOSIS — E559 Vitamin D deficiency, unspecified: Secondary | ICD-10-CM

## 2020-04-27 DIAGNOSIS — E669 Obesity, unspecified: Secondary | ICD-10-CM | POA: Diagnosis not present

## 2020-04-27 MED ORDER — VITAMIN D (ERGOCALCIFEROL) 1.25 MG (50000 UNIT) PO CAPS: ORAL_CAPSULE | ORAL | 1 refills | Status: DC

## 2020-04-27 NOTE — Progress Notes (Signed)
Subjective:    Patient ID: Carla Morales, female    DOB: June 21, 1961, 59 y.o.   MRN: [id number]  Chief Complaint  Patient presents with  . Medical Management of Chronic Issues   Pt presents to the office today for CPE without pap. She is followed by GYN annually. She currently wearing estradiol patches for menopause.  Gastroesophageal Reflux She complains of belching and heartburn. This is a chronic problem. The current episode started more than 1 year ago. The problem occurs occasionally. The problem has been waxing and waning. She has tried a PPI for the symptoms. The treatment provided moderate relief.  Hyperlipidemia This is a chronic problem. The current episode started more than 1 year ago. The problem is controlled. Recent lipid tests were reviewed and are normal. Exacerbating diseases include obesity. Current antihyperlipidemic treatment includes statins. The current treatment provides moderate improvement of lipids. Risk factors for coronary artery disease include dyslipidemia and a sedentary lifestyle.  Anxiety Presents for follow-up visit. Symptoms include excessive worry, irritability and panic. Symptoms occur occasionally. The severity of symptoms is mild.        Review of Systems  Constitutional: Positive for irritability.  Gastrointestinal: Positive for heartburn.  All other systems reviewed and are negative.  Family History  Problem Relation Age of Onset  . Hypertension Father   . Melanoma Father 42  . Rheum arthritis Mother   . Fibromyalgia Mother   . Cancer Mother        breast, uterine, adenocarcinoma-right lung  . Uterine cancer Mother        Sarcoma  . Lung cancer Mother        Adenocarcinoma  . Breast cancer Mother 59  . Other Daughter        Phyllodes tumor  . Diabetes Maternal Aunt   . Diabetes Maternal Uncle   . Diabetes Paternal Aunt   . Breast cancer Paternal Aunt 21  . Diabetes Paternal Uncle   . Heart disease Maternal Grandfather   .  Asthma Sister   . Other Maternal Grandmother 30       childbirth  . Cancer Paternal Grandmother        unknown  . Cancer Maternal Aunt 45       GYN cancer - possibly vulvar?  . Appendicitis Maternal Uncle        d. 12  . Breast cancer Paternal Aunt 70  . Uterine cancer Paternal Aunt 94  . Breast cancer Paternal Aunt 28  . Graves' disease Nephew   . Colon cancer Neg Hx   . Esophageal cancer Neg Hx   . Ulcerative colitis Neg Hx   . Stomach cancer Neg Hx    Social History   Socioeconomic History  . Marital status: Married    Spouse name: Not on file  . Number of children: Not on file  . Years of education: Not on file  . Highest education level: Not on file  Occupational History  . Not on file  Tobacco Use  . Smoking status: Never Smoker  . Smokeless tobacco: Never Used  Vaping Use  . Vaping Use: Never used  Substance and Sexual Activity  . Alcohol use: No    Alcohol/week: 0.0 standard drinks  . Drug use: No  . Sexual activity: Yes    Partners: Male    Birth control/protection: Surgical    Comment: hyst.-1st intercourse 89 yo-1 partner  Other Topics Concern  . Not on file  Social History Narrative  .  Not on file   Social Determinants of Health   Financial Resource Strain:   . Difficulty of Paying Living Expenses:   Food Insecurity:   . Worried About Programme researcher, broadcasting/film/video in the Last Year:   . Barista in the Last Year:   Transportation Needs:   . Freight forwarder (Medical):   Marland Kitchen Lack of Transportation (Non-Medical):   Physical Activity:   . Days of Exercise per Week:   . Minutes of Exercise per Session:   Stress:   . Feeling of Stress :   Social Connections:   . Frequency of Communication with Friends and Family:   . Frequency of Social Gatherings with Friends and Family:   . Attends Religious Services:   . Active Member of Clubs or Organizations:   . Attends Banker Meetings:   Marland Kitchen Marital Status:        Objective:   Physical  Exam Vitals reviewed.  Constitutional:      General: She is not in acute distress.    Appearance: She is well-developed.  HENT:     Head: Normocephalic and atraumatic.     Right Ear: Tympanic membrane normal.     Left Ear: Tympanic membrane normal.  Eyes:     Pupils: Pupils are equal, round, and reactive to light.  Neck:     Thyroid: No thyromegaly.  Cardiovascular:     Rate and Rhythm: Normal rate and regular rhythm.     Heart sounds: Normal heart sounds. No murmur heard.   Pulmonary:     Effort: Pulmonary effort is normal. No respiratory distress.     Breath sounds: Normal breath sounds. No wheezing.  Abdominal:     General: Bowel sounds are normal. There is no distension.     Palpations: Abdomen is soft.     Tenderness: There is no abdominal tenderness.  Musculoskeletal:        General: No tenderness. Normal range of motion.     Cervical back: Normal range of motion and neck supple.  Skin:    General: Skin is warm and dry.  Neurological:     Mental Status: She is alert and oriented to person, place, and time.     Cranial Nerves: No cranial nerve deficit.     Deep Tendon Reflexes: Reflexes are normal and symmetric.  Psychiatric:        Behavior: Behavior normal.        Thought Content: Thought content normal.        Judgment: Judgment normal.      BP 106/68   Pulse 82   Temp 97.8 F (36.6 C) (Temporal)   Ht 5\' 5"  (1.651 m)   Wt 191 lb 9.6 oz (86.9 kg)   LMP 07/24/2010   SpO2 97%   BMI 31.88 kg/m       Assessment & Plan:  Carla Morales comes in today with chief complaint of Medical Management of Chronic Issues   Diagnosis and orders addressed:  1. Gastroesophageal reflux disease, unspecified whether esophagitis present - CMP14+EGFR - CBC with Differential/Platelet  2. Vitamin D deficiency - CMP14+EGFR - CBC with Differential/Platelet - TSH - VITAMIN D 25 Hydroxy (Vit-D Deficiency, Fractures)  3. Obesity (BMI 30-39.9) - CMP14+EGFR - CBC  with Differential/Platelet  4. Pure hypercholesterolemia - CMP14+EGFR - CBC with Differential/Platelet - Lipid panel  5. Annual physical exam - CMP14+EGFR - CBC with Differential/Platelet - Lipid panel - TSH   Labs pending Health Maintenance  reviewed Diet and exercise encouraged  Follow up plan 1 year  [name], FNP

## 2020-04-27 NOTE — Patient Instructions (Signed)
Health Maintenance, Female Adopting a healthy lifestyle and getting preventive care are important in promoting health and wellness. Ask your health care provider about:  The right schedule for you to have regular tests and exams.  Things you can do on your own to prevent diseases and keep yourself healthy. What should I know about diet, weight, and exercise? Eat a healthy diet   Eat a diet that includes plenty of vegetables, fruits, low-fat dairy products, and lean protein.  Do not eat a lot of foods that are high in solid fats, added sugars, or sodium. Maintain a healthy weight Body mass index (BMI) is used to identify weight problems. It estimates body fat based on height and weight. Your health care provider can help determine your BMI and help you achieve or maintain a healthy weight. Get regular exercise Get regular exercise. This is one of the most important things you can do for your health. Most adults should:  Exercise for at least 150 minutes each week. The exercise should increase your heart rate and make you sweat (moderate-intensity exercise).  Do strengthening exercises at least twice a week. This is in addition to the moderate-intensity exercise.  Spend less time sitting. Even light physical activity can be beneficial. Watch cholesterol and blood lipids Have your blood tested for lipids and cholesterol at 57 years of age, then have this test every 5 years. Have your cholesterol levels checked more often if:  Your lipid or cholesterol levels are high.  You are older than 57 years of age.  You are at high risk for heart disease. What should I know about cancer screening? Depending on your health history and family history, you may need to have cancer screening at various ages. This may include screening for:  Breast cancer.  Cervical cancer.  Colorectal cancer.  Skin cancer.  Lung cancer. What should I know about heart disease, diabetes, and high blood  pressure? Blood pressure and heart disease  High blood pressure causes heart disease and increases the risk of stroke. This is more likely to develop in people who have high blood pressure readings, are of African descent, or are overweight.  Have your blood pressure checked: ? Every 3-5 years if you are 18-39 years of age. ? Every year if you are 40 years old or older. Diabetes Have regular diabetes screenings. This checks your fasting blood sugar level. Have the screening done:  Once every three years after age 40 if you are at a normal weight and have a low risk for diabetes.  More often and at a younger age if you are overweight or have a high risk for diabetes. What should I know about preventing infection? Hepatitis B If you have a higher risk for hepatitis B, you should be screened for this virus. Talk with your health care provider to find out if you are at risk for hepatitis B infection. Hepatitis C Testing is recommended for:  Everyone born from 1945 through 1965.  Anyone with known risk factors for hepatitis C. Sexually transmitted infections (STIs)  Get screened for STIs, including gonorrhea and chlamydia, if: ? You are sexually active and are younger than 57 years of age. ? You are older than 57 years of age and your health care provider tells you that you are at risk for this type of infection. ? Your sexual activity has changed since you were last screened, and you are at increased risk for chlamydia or gonorrhea. Ask your health care provider if   you are at risk.  Ask your health care provider about whether you are at high risk for HIV. Your health care provider may recommend a prescription medicine to help prevent HIV infection. If you choose to take medicine to prevent HIV, you should first get tested for HIV. You should then be tested every 3 months for as long as you are taking the medicine. Pregnancy  If you are about to stop having your period (premenopausal) and  you may become pregnant, seek counseling before you get pregnant.  Take 400 to 800 micrograms (mcg) of folic acid every day if you become pregnant.  Ask for birth control (contraception) if you want to prevent pregnancy. Osteoporosis and menopause Osteoporosis is a disease in which the bones lose minerals and strength with aging. This can result in bone fractures. If you are 65 years old or older, or if you are at risk for osteoporosis and fractures, ask your health care provider if you should:  Be screened for bone loss.  Take a calcium or vitamin D supplement to lower your risk of fractures.  Be given hormone replacement therapy (HRT) to treat symptoms of menopause. Follow these instructions at home: Lifestyle  Do not use any products that contain nicotine or tobacco, such as cigarettes, e-cigarettes, and chewing tobacco. If you need help quitting, ask your health care provider.  Do not use street drugs.  Do not share needles.  Ask your health care provider for help if you need support or information about quitting drugs. Alcohol use  Do not drink alcohol if: ? Your health care provider tells you not to drink. ? You are pregnant, may be pregnant, or are planning to become pregnant.  If you drink alcohol: ? Limit how much you use to 0-1 drink a day. ? Limit intake if you are breastfeeding.  Be aware of how much alcohol is in your drink. In the [place] one drink equals one 12 oz bottle of beer (355 mL), one 5 oz glass of wine (148 mL), or one 1 oz glass of hard liquor (44 mL). General instructions  Schedule regular health, dental, and eye exams.  Stay current with your vaccines.  Tell your health care provider if: ? You often feel depressed. ? You have ever been abused or do not feel safe at home. Summary  Adopting a healthy lifestyle and getting preventive care are important in promoting health and wellness.  Follow your health care provider's instructions about healthy  diet, exercising, and getting tested or screened for diseases.  Follow your health care provider's instructions on monitoring your cholesterol and blood pressure. This information is not intended to replace advice given to you by your health care provider. Make sure you discuss any questions you have with your health care provider. Document Revised: 10/23/2018 Document Reviewed: 10/23/2018 Elsevier Patient Education  2020 [employer]  

## 2020-04-28 LAB — CBC WITH DIFFERENTIAL/PLATELET
Basophils Absolute: 0.1 10*3/uL (ref 0.0–0.2)
Basos: 1 %
EOS (ABSOLUTE): 0.3 10*3/uL (ref 0.0–0.4)
Eos: 5 %
Hematocrit: 38.2 % (ref 34.0–46.6)
Hemoglobin: 12.8 g/dL (ref 11.1–15.9)
Immature Grans (Abs): 0 10*3/uL (ref 0.0–0.1)
Immature Granulocytes: 0 %
Lymphocytes Absolute: 2.1 10*3/uL (ref 0.7–3.1)
Lymphs: 31 %
MCH: 30 pg (ref 26.6–33.0)
MCHC: 33.5 g/dL (ref 31.5–35.7)
MCV: 90 fL (ref 79–97)
Monocytes Absolute: 0.6 10*3/uL (ref 0.1–0.9)
Monocytes: 9 %
Neutrophils Absolute: 3.8 10*3/uL (ref 1.4–7.0)
Neutrophils: 54 %
Platelets: 272 10*3/uL (ref 150–450)
RBC: 4.26 x10E6/uL (ref 3.77–5.28)
RDW: 13.2 % (ref 11.7–15.4)
WBC: 7 10*3/uL (ref 3.4–10.8)

## 2020-04-28 LAB — LIPID PANEL
Chol/HDL Ratio: 3.2 ratio (ref 0.0–4.4)
Cholesterol, Total: 156 mg/dL (ref 100–199)
HDL: 49 mg/dL (ref >39)
LDL Chol Calc (NIH): 74 mg/dL (ref 0–99)
Triglycerides: 196 mg/dL — ABNORMAL HIGH (ref 0–149)
VLDL Cholesterol Cal: 33 mg/dL (ref 5–40)

## 2020-04-28 LAB — CMP14+EGFR
ALT: 23 IU/L (ref 0–32)
AST: 21 IU/L (ref 0–40)
Albumin/Globulin Ratio: 1.5 (ref 1.2–2.2)
Albumin: 4.2 g/dL (ref 3.8–4.9)
Alkaline Phosphatase: 129 IU/L — ABNORMAL HIGH (ref 48–121)
BUN/Creatinine Ratio: 15 (ref 9–23)
BUN: 12 mg/dL (ref 6–24)
Bilirubin Total: 0.5 mg/dL (ref 0.0–1.2)
CO2: 23 mmol/L (ref 20–29)
Calcium: 9.4 mg/dL (ref 8.7–10.2)
Chloride: 104 mmol/L (ref 96–106)
Creatinine, Ser: 0.82 mg/dL (ref 0.57–1.00)
GFR calc Af Amer: 93 mL/min/{1.73_m2} (ref >59)
GFR calc non Af Amer: 80 mL/min/{1.73_m2} (ref >59)
Globulin, Total: 2.8 g/dL (ref 1.5–4.5)
Glucose: 98 mg/dL (ref 65–99)
Potassium: 4.3 mmol/L (ref 3.5–5.2)
Sodium: 139 mmol/L (ref 134–144)
Total Protein: 7 g/dL (ref 6.0–8.5)

## 2020-04-28 LAB — VITAMIN D 25 HYDROXY (VIT D DEFICIENCY, FRACTURES): Vit D, 25-Hydroxy: 63.5 ng/mL (ref 30.0–100.0)

## 2020-04-28 LAB — TSH: TSH: 1.76 u[IU]/mL (ref 0.450–4.500)

## 2020-04-29 ENCOUNTER — Other Ambulatory Visit: Payer: BC Managed Care – PPO

## 2020-04-29 ENCOUNTER — Ambulatory Visit: Payer: BC Managed Care – PPO | Admitting: Family

## 2020-05-14 DIAGNOSIS — Z23 Encounter for immunization: Secondary | ICD-10-CM | POA: Diagnosis not present

## 2020-06-11 DIAGNOSIS — Z23 Encounter for immunization: Secondary | ICD-10-CM | POA: Diagnosis not present

## 2020-06-18 ENCOUNTER — Other Ambulatory Visit: Payer: Self-pay | Admitting: Family

## 2020-09-17 ENCOUNTER — Other Ambulatory Visit: Payer: Self-pay | Admitting: Family

## 2020-09-23 ENCOUNTER — Other Ambulatory Visit: Payer: Self-pay

## 2020-09-23 DIAGNOSIS — E559 Vitamin D deficiency, unspecified: Secondary | ICD-10-CM

## 2020-09-23 DIAGNOSIS — E669 Obesity, unspecified: Secondary | ICD-10-CM

## 2020-09-23 DIAGNOSIS — Z780 Asymptomatic menopausal state: Secondary | ICD-10-CM

## 2020-09-25 ENCOUNTER — Telehealth: Payer: BC Managed Care – PPO | Admitting: Emergency Medicine

## 2020-09-25 DIAGNOSIS — J329 Chronic sinusitis, unspecified: Secondary | ICD-10-CM

## 2020-09-25 MED ORDER — PROMETHAZINE-PHENYLEPHRINE 6.25-5 MG/5ML PO SYRP: 5.0000 mL | ORAL_SOLUTION | ORAL | 0 refills | Status: DC | PRN

## 2020-09-25 MED ORDER — AMOXICILLIN-POT CLAVULANATE 875-125 MG PO TABS
1.0000 | ORAL_TABLET | Freq: Two times a day (BID) | ORAL | 0 refills | Status: DC
Start: 2020-09-25 — End: 2020-11-26

## 2020-09-25 NOTE — Progress Notes (Signed)
Time spent: 10 min  We are sorry that you are not feeling well.  Here is how we plan to help!  Based on what you have shared with me it looks like you have sinusitis.  Sinusitis is inflammation and infection in the sinus cavities of the head.  Based on your presentation I believe you most likely have Acute Bacterial Sinusitis.  This is an infection caused by bacteria and is treated with antibiotics. I have prescribed Augmentin 875mg /125mg  one tablet twice daily with food, for 7 days. You may use an oral decongestant such as Mucinex D or if you have glaucoma or high blood pressure use plain Mucinex. Saline nasal spray help and can safely be used as often as needed for congestion.  If you develop worsening sinus pain, fever or notice severe headache and vision changes, or if symptoms are not better after completion of antibiotic, please schedule an appointment with a health care provider.    Additionally I have prescribed a cough medicine called promethazine DM which can help with disruptive cough.   There is actually no good evidence that systemic steroids (prednisone) are helpful in sinusitis and recommendations are against using them.  Continue using flonase which is an intranasal steroid.   Sinus infections are not as easily transmitted as other respiratory infection, however we still recommend that you avoid close contact with loved ones, especially the very young and elderly.  Remember to wash your hands thoroughly throughout the day as this is the number one way to prevent the spread of infection!  Home Care:  Only take medications as instructed by your medical team.  Complete the entire course of an antibiotic.  Do not take these medications with alcohol.  A steam or ultrasonic humidifier can help congestion.  You can place a towel over your head and breathe in the steam from hot water coming from a faucet.  Avoid close contacts especially the very young and the elderly.  Cover your  mouth when you cough or sneeze.  Always remember to wash your hands.  Get Help Right Away If:  You develop worsening fever or sinus pain.  You develop a severe head ache or visual changes.  Your symptoms persist after you have completed your treatment plan.  Make sure you  Understand these instructions.  Will watch your condition.  Will get help right away if you are not doing well or get worse.  Your e-visit answers were reviewed by a board certified advanced clinical practitioner to complete your personal care plan.  Depending on the condition, your plan could have included both over the counter or prescription medications.  If there is a problem please reply  once you have received a response from your provider.  Your safety is important to [place].  If you have drug allergies check your prescription carefully.    You can use MyChart to ask questions about today's visit, request a non-urgent call back, or ask for a work or school excuse for 24 hours related to this e-Visit. If it has been greater than 24 hours you will need to follow up with your provider, or enter a new e-Visit to address those concerns.  You will get an e-mail in the next two days asking about your experience.  I hope that your e-visit has been valuable and will speed your recovery. Thank you for using e-visits.

## 2020-10-18 DIAGNOSIS — Z23 Encounter for immunization: Secondary | ICD-10-CM | POA: Diagnosis not present

## 2020-10-27 ENCOUNTER — Other Ambulatory Visit: Payer: Self-pay | Admitting: Family

## 2020-10-27 DIAGNOSIS — Z1231 Encounter for screening mammogram for malignant neoplasm of breast: Secondary | ICD-10-CM

## 2020-11-25 ENCOUNTER — Ambulatory Visit: Payer: BC Managed Care – PPO

## 2020-11-26 ENCOUNTER — Ambulatory Visit (INDEPENDENT_AMBULATORY_CARE_PROVIDER_SITE_OTHER): Payer: BC Managed Care – PPO | Admitting: Obstetrics and Gynecology

## 2020-11-26 ENCOUNTER — Encounter: Payer: Self-pay | Admitting: Obstetrics and Gynecology

## 2020-11-26 ENCOUNTER — Other Ambulatory Visit: Payer: Self-pay

## 2020-11-26 VITALS — BP 126/80 | Ht 65.0 in | Wt 195.0 lb

## 2020-11-26 DIAGNOSIS — Z79890 Hormone replacement therapy (postmenopausal): Secondary | ICD-10-CM

## 2020-11-26 DIAGNOSIS — R413 Other amnesia: Secondary | ICD-10-CM | POA: Diagnosis not present

## 2020-11-26 DIAGNOSIS — Z01419 Encounter for gynecological examination (general) (routine) without abnormal findings: Secondary | ICD-10-CM

## 2020-11-26 NOTE — Progress Notes (Signed)
Londyn Hotard [facility] Sep 12, 1961 [id number]  SUBJECTIVE:  60 y.o. G1P1 female for annual routine gynecologic exam. She has no gynecologic concerns.  Does have some memory related concerns as discussed below.  Current Outpatient Medications  Medication Sig Dispense Refill  . ALPRAZolam (XANAX) 0.5 MG tablet Take 1 tablet (0.5 mg total) by mouth at bedtime as needed for anxiety. 20 tablet 2  . atorvastatin (LIPITOR) 10 MG tablet TAKE 1 TABLET BY MOUTH EVERY DAY 30 tablet 2  . diphenhydrAMINE (BENADRYL) 12.5 MG/5ML liquid Take by mouth at bedtime as needed.    Marland Kitchen estradiol (VIVELLE-DOT) 0.075 MG/24HR UNWRAP AND APPLY 1 PATCH TO SKIN 2 TIMES PER WEEK. 24 patch 3  . famotidine (PEPCID) 40 MG tablet Take 1 tablet (40 mg total) by mouth in the morning. 30 tablet 11  . fluticasone (FLONASE) 50 MCG/ACT nasal spray USE 2 SPAYRS IN EACH NOSTRIL DAILY. GENERIC EQUIVALENT FOR FLONASE 48 g 1  . Krill Oil 300 MG CAPS Take by mouth.    . loratadine (CLARITIN) 10 MG tablet Take 10 mg by mouth daily.    . meloxicam (MOBIC) 15 MG tablet Take 1 tablet (15 mg total) by mouth daily. 90 tablet 0  . VENTOLIN HFA 108 (90 Base) MCG/ACT inhaler TAKE 2 PUFFS BY MOUTH EVERY 6 HOURS AS NEEDED FOR WHEEZE OR SHORTNESS OF BREATH 8 g 11  . Vitamin D, Ergocalciferol, (DRISDOL) 1.25 MG (50000 UNIT) CAPS capsule TAKE 1 CAPSULE BY MOUTH EVERY 7 DAYS GENERIC EQUIVALENT FOR DRISDOL 12 capsule 1   No current facility-administered medications for this visit.   Allergies: Other, Contrast media [iodinated diagnostic agents], Haldol [haloperidol decanoate], Pravastatin, and Sulfa antibiotics  Patient's last menstrual period was 07/24/2010.  Past medical history,surgical history, problem list, medications, allergies, family history and social history were all reviewed and documented as reviewed in the EPIC chart.  ROS: Pertinent positives and negatives as reviewed in HPI   OBJECTIVE:  BP 126/80 (BP Location: Right Arm, Patient  Position: Sitting, Cuff Size: Normal)   Ht 5\' 5"  (1.651 m)   Wt 195 lb (88.5 kg)   LMP 07/24/2010   BMI 32.45 kg/m  The patient appears well, alert, oriented, in no distress. ENT normal.  Neck supple. No cervical or supraclavicular adenopathy or thyromegaly.  Lungs are clear, good air entry, no wheezes, rhonchi or rales. S1 and S2 normal, no murmurs, regular rate and rhythm.  Abdomen soft without tenderness, guarding, mass or organomegaly.  Neurological is normal, no focal findings.  BREAST EXAM: breasts appear normal, no suspicious masses, no skin or nipple changes or axillary nodes  PELVIC EXAM: VULVA: normal appearing vulva with atrophic change, no masses, tenderness or lesions, VAGINA: normal appearing vagina with atrophic change, normal color and discharge, no lesions, CERVIX: surgically absent, UTERUS: surgically absent, vaginal cuff normal, ADNEXA: no masses, nontender  Chaperone: Wandra Scot Bonham present during the examination  ASSESSMENT:  60 y.o. G1P1 here for annual gynecologic exam  PLAN:   1. Postmenopausal/HRT/memory concerns. Prior [facility] in 2011 for menorrhagia and leiomyoma.  Continues on Vivelle 0.075 mg patch.  Aware of the risks of thrombotic diseases such as heart attack, stroke, DVT, PE and breast cancer issue.  Also concerned about memory changes.  It sounds like she has been through a lot of stressors with passing of mother in the past few years and then her daughter had a complicated delivery out 6 months ago, but her grandchild is doing fine at this point.  No known  family history of early onset dementia or Alzheimer.  We discussed some of the expected "brain fog" that can come with menopause, and external stressors and life circumstances may also play a role, but if she is noticing any severe memory changes I recommended she discuss this further with her primary for any referrals that may be needed for further in-depth evaluation.  She would like to continue on HRT at  this time so I provided her with a refill x1 year.  2. Pap smear 04/2018.  No significant history of abnormal Pap smears.  Next Pap smear at her next exam if she were to desire to continue screening.  Further address at that time. 3. Mammogram coming up 12/2020.  Normal breast exam today.   4. Colonoscopy 09/2019.  Recommended that she follow up at the recommended interval.  5. DEXA 2016 normal.  Next DEXA recommended at age 63.  77. Health maintenance.  No labs today as she normally has these completed elsewhere.  Return annually or sooner, prn.  Joseph Pierini MD 11/26/20

## 2020-12-15 ENCOUNTER — Other Ambulatory Visit: Payer: Self-pay

## 2020-12-15 ENCOUNTER — Ambulatory Visit
Admission: RE | Admit: 2020-12-15 | Discharge: 2020-12-15 | Disposition: A | Discharge status: Home / Self Care | Payer: BC Managed Care – PPO | Source: Ambulatory Visit | Attending: Family | Admitting: Family

## 2020-12-15 ENCOUNTER — Other Ambulatory Visit: Payer: Self-pay | Admitting: Family

## 2020-12-15 DIAGNOSIS — Z1231 Encounter for screening mammogram for malignant neoplasm of breast: Secondary | ICD-10-CM | POA: Diagnosis not present

## 2020-12-15 NOTE — Telephone Encounter (Signed)
OV 04/27/20 RTC 1 yr Next OV 04/28/21

## 2020-12-24 DIAGNOSIS — Z23 Encounter for immunization: Secondary | ICD-10-CM | POA: Diagnosis not present

## 2021-01-07 ENCOUNTER — Ambulatory Visit (INDEPENDENT_AMBULATORY_CARE_PROVIDER_SITE_OTHER): Payer: BC Managed Care – PPO

## 2021-01-07 ENCOUNTER — Ambulatory Visit: Payer: BC Managed Care – PPO | Admitting: Family

## 2021-01-07 ENCOUNTER — Other Ambulatory Visit: Payer: Self-pay

## 2021-01-07 ENCOUNTER — Encounter: Payer: Self-pay | Admitting: Family

## 2021-01-07 VITALS — BP 101/61 | HR 76 | Temp 97.3°F | Ht 65.0 in | Wt 196.0 lb

## 2021-01-07 DIAGNOSIS — E559 Vitamin D deficiency, unspecified: Secondary | ICD-10-CM

## 2021-01-07 DIAGNOSIS — Z713 Dietary counseling and surveillance: Secondary | ICD-10-CM

## 2021-01-07 DIAGNOSIS — E669 Obesity, unspecified: Secondary | ICD-10-CM

## 2021-01-07 DIAGNOSIS — R6889 Other general symptoms and signs: Secondary | ICD-10-CM | POA: Diagnosis not present

## 2021-01-07 DIAGNOSIS — Z8616 Personal history of COVID-19: Secondary | ICD-10-CM | POA: Diagnosis not present

## 2021-01-07 DIAGNOSIS — R0602 Shortness of breath: Secondary | ICD-10-CM

## 2021-01-07 DIAGNOSIS — M8588 Other specified disorders of bone density and structure, other site: Secondary | ICD-10-CM | POA: Diagnosis not present

## 2021-01-07 DIAGNOSIS — J45909 Unspecified asthma, uncomplicated: Secondary | ICD-10-CM | POA: Insufficient documentation

## 2021-01-07 DIAGNOSIS — R4189 Other symptoms and signs involving cognitive functions and awareness: Secondary | ICD-10-CM

## 2021-01-07 DIAGNOSIS — K219 Gastro-esophageal reflux disease without esophagitis: Secondary | ICD-10-CM

## 2021-01-07 DIAGNOSIS — J4520 Mild intermittent asthma, uncomplicated: Secondary | ICD-10-CM

## 2021-01-07 DIAGNOSIS — R5383 Other fatigue: Secondary | ICD-10-CM

## 2021-01-07 DIAGNOSIS — Z780 Asymptomatic menopausal state: Secondary | ICD-10-CM | POA: Diagnosis not present

## 2021-01-07 DIAGNOSIS — E7800 Pure hypercholesterolemia, unspecified: Secondary | ICD-10-CM | POA: Diagnosis not present

## 2021-01-07 MED ORDER — PHENTERMINE HCL 37.5 MG PO TABS: 37.5000 mg | ORAL_TABLET | Freq: Every day | ORAL | 2 refills | Status: DC

## 2021-01-07 MED ORDER — QVAR REDIHALER 80 MCG/ACT IN AERB: 2.0000 | INHALATION_SPRAY | Freq: Two times a day (BID) | RESPIRATORY_TRACT | 3 refills | Status: DC

## 2021-01-07 NOTE — Progress Notes (Signed)
   Subjective:    Patient ID: Carla Morales, female    DOB: [birth year], 60 y.o.   MRN: [id number]  Chief Complaint  Patient presents with  . Asthma    Thinks she needs something daily. COVID + in 2020 and SOB and and asthma is worse   . Brain Fog     Pt presents to the office today for chronic follow up. She is followed by GYN annually. She currently wearing estradiol patches for menopause.   She is complaining of increased SOB, fatigue, and brain fog since having COVID in 2020. She reports she has taken QVar in the past that has helped with her SOB.   She is also wanting to try to lose weight. 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  Asthma She complains of cough and shortness of breath. There is no wheezing. This is a chronic problem. The current episode started more than 1 year ago. The problem occurs intermittently. Pertinent negatives include no heartburn. Her symptoms are not alleviated by rest. Her past medical history is significant for asthma.  Gastroesophageal Reflux She complains of coughing. She reports no belching, no heartburn or no wheezing. This is a chronic problem. The current episode started more than 1 year ago. The problem occurs occasionally. Risk factors include obesity. She has tried an antacid for the symptoms. The treatment provided moderate relief.  Shortness of Breath This is a chronic problem. The current episode started more than 1 year ago. The problem occurs intermittently. Pertinent negatives include no wheezing. Her past medical history is significant for asthma.      Review of Systems  Respiratory: Positive for cough and shortness of breath. Negative for wheezing.   Gastrointestinal: Negative for heartburn.       Objective:   Physical Exam    BP 101/61   Pulse 76   Temp (!) 97.3 F (36.3 C) (Temporal)   Ht 5\' 5"  (1.651 m)   Wt 196 lb (88.9 kg)   LMP 07/24/2010   SpO2 100%   BMI  32.62 kg/m      Assessment & Plan:

## 2021-01-07 NOTE — Patient Instructions (Signed)
http://www.aaaai.org/conditions-and-treatments/asthma">  Asthma, Adult  Asthma is a long-term (chronic) condition that causes recurrent episodes in which the airways become tight and narrow. The airways are the passages that lead from the nose and mouth down into the lungs. Asthma episodes, also called asthma attacks, can cause coughing, wheezing, shortness of breath, and chest pain. The airways can also fill with mucus. During an attack, it can be difficult to breathe. Asthma attacks can range from minor to life threatening. Asthma cannot be cured, but medicines and lifestyle changes can help control it and treat acute attacks. What are the causes? This condition is believed to be caused by inherited (genetic) and environmental factors, but its exact cause is not known. There are many things that can bring on an asthma attack or make asthma symptoms worse (triggers). Asthma triggers are different for each person. Common triggers include:  Mold.  Dust.  Cigarette smoke.  Cockroaches.  Things that can cause allergy symptoms (allergens), such as animal dander or pollen from trees or grass.  Air pollutants such as household cleaners, wood smoke, smog, or chemical odors.  Cold air, weather changes, and winds (which increase molds and pollen in the air).  Strong emotional expressions such as crying or laughing hard.  Stress.  Certain medicines (such as aspirin) or types of medicines (such as beta-blockers).  Sulfites in foods and drinks. Foods and drinks that may contain sulfites include dried fruit, potato chips, and sparkling grape juice.  Infections or inflammatory conditions such as the flu, a cold, or inflammation of the nasal membranes (rhinitis).  Gastroesophageal reflux disease (GERD).  Exercise or strenuous activity. What are the signs or symptoms? Symptoms of this condition may occur right after asthma is triggered or many hours later. Symptoms include:  Wheezing. This can  sound like whistling when you breathe.  Excessive nighttime or early morning coughing.  Frequent or severe coughing with a common cold.  Chest tightness.  Shortness of breath.  Tiredness (fatigue) with minimal activity. How is this diagnosed? This condition is diagnosed based on:  Your medical history.  A physical exam.  Tests, which may include: ? Lung function studies and pulmonary studies (spirometry). These tests can evaluate the flow of air in your lungs. ? Allergy tests. ? Imaging tests, such as X-rays. How is this treated? There is no cure for this condition, but treatment can help control your symptoms. Treatment for asthma usually involves:  Identifying and avoiding your asthma triggers.  Using medicines to control your symptoms. Generally, two types of medicines are used to treat asthma: ? Controller medicines. These help prevent asthma symptoms from occurring. They are usually taken every day. ? Fast-acting reliever or rescue medicines. These quickly relieve asthma symptoms by widening the narrow and tight airways. They are used as needed and provide short-term relief.  Using supplemental oxygen. This may be needed during a severe episode.  Using other medicines, such as: ? Allergy medicines, such as antihistamines, if your asthma attacks are triggered by allergens. ? Immune medicines (immunomodulators). These are medicines that help control the immune system.  Creating an asthma action plan. An asthma action plan is a written plan for managing and treating your asthma attacks. This plan includes: ? A list of your asthma triggers and how to avoid them. ? Information about when medicines should be taken and when their dosage should be changed. ? Instructions about using a device called a peak flow meter. A peak flow meter measures how well the lungs are working   and the severity of your asthma. It helps you monitor your condition. Follow these instructions at  home: Controlling your home environment Control your home environment in the following ways to help avoid triggers and prevent asthma attacks:  Change your heating and air conditioning filter regularly.  Limit your use of fireplaces and wood stoves.  Get rid of pests (such as roaches and mice) and their droppings.  Throw away plants if you see mold on them.  Clean floors and dust surfaces regularly. Use unscented cleaning products.  Try to have someone else vacuum for you regularly. Stay out of rooms while they are being vacuumed and for a short while afterward. If you vacuum, use a dust mask from a hardware store, a double-layered or microfilter vacuum cleaner bag, or a vacuum cleaner with a HEPA filter.  Replace carpet with wood, tile, or vinyl flooring. Carpet can trap dander and dust.  Use allergy-proof pillows, mattress covers, and box spring covers.  Keep your bedroom a trigger-free room.  Avoid pets and keep windows closed when allergens are in the air.  Wash beddings every week in hot water and dry them in a dryer.  Use blankets that are made of polyester or cotton.  Clean bathrooms and kitchens with bleach. If possible, have someone repaint the walls in these rooms with mold-resistant paint. Stay out of the rooms that are being cleaned and painted.  Wash your hands often with soap and water. If soap and water are not available, use hand sanitizer.  Do not allow anyone to smoke in your home. General instructions  Take over-the-counter and prescription medicines only as told by your health care provider. ? Speak with your health care provider if you have questions about how or when to take the medicines. ? Make note if you are requiring more frequent dosages.  Do not use any products that contain nicotine or tobacco, such as cigarettes and e-cigarettes. If you need help quitting, ask your health care provider. Also, avoid being exposed to secondhand smoke.  Use a peak  flow meter as told by your health care provider. Record and keep track of the readings.  Understand and use the asthma action plan to help minimize, or stop an asthma attack, without needing to seek medical care.  Make sure you stay up to date on your yearly vaccinations as told by your health care provider. This may include vaccines for the flu and pneumonia.  Avoid outdoor activities when allergen counts are high and when air quality is low.  Wear a ski mask that covers your nose and mouth during outdoor winter activities. Exercise indoors on cold days if you can.  Warm up before exercising, and take time for a cool-down period after exercise.  Keep all follow-up visits as told by your health care provider. This is important. Where to find more information  For information about asthma, turn to the Centers for Disease Control and Prevention at http://www.clark.net/  For air quality information, turn to [employer] at https://www.miller-reyes.info/ Contact a health care provider if:  You have wheezing, shortness of breath, or a cough even while you are taking medicine to prevent attacks.  The mucus you cough up (sputum) is thicker than usual.  Your sputum changes from clear or white to yellow, green, gray, or bloody.  Your medicines are causing side effects, such as a rash, itching, swelling, or trouble breathing.  You need to use a reliever medicine more than 2-3 times a week.  Your peak  flow reading is still at 50-79% of your personal best after following your action plan for 1 hour.  You have a fever. Get help right away if:  You are getting worse and do not respond to treatment during an asthma attack.  You are short of breath when at rest or when doing very little physical activity.  You have difficulty eating, drinking, or talking.  You have chest pain or tightness.  You develop a fast heartbeat or palpitations.  You have a bluish color to your lips or fingernails.  You are  light-headed or dizzy, or you faint.  Your peak flow reading is less than 50% of your personal best.  You feel too tired to breathe normally. Summary  Asthma is a long-term (chronic) condition that causes recurrent episodes in which the airways become tight and narrow. These episodes can cause coughing, wheezing, shortness of breath, and chest pain.  Asthma cannot be cured, but medicines and lifestyle changes can help control it and treat acute attacks.  Make sure you understand how to avoid triggers and how and when to use your medicines.  Asthma attacks can range from minor to life threatening. Get help right away if you have an asthma attack and do not respond to treatment with your usual rescue medicines. This information is not intended to replace advice given to you by your health care provider. Make sure you discuss any questions you have with your health care provider. Document Revised: 07/30/2020 Document Reviewed: 03/03/2020 Elsevier Patient Education  2021 [employer].

## 2021-01-08 LAB — ANEMIA PROFILE B
Basophils Absolute: 0.1 10*3/uL (ref 0.0–0.2)
Basos: 1 %
EOS (ABSOLUTE): 0.3 10*3/uL (ref 0.0–0.4)
Eos: 3 %
Ferritin: 74 ng/mL (ref 15–150)
Folate: 11.5 ng/mL (ref >3.0)
Hematocrit: 39.7 % (ref 34.0–46.6)
Hemoglobin: 13.6 g/dL (ref 11.1–15.9)
Immature Grans (Abs): 0 10*3/uL (ref 0.0–0.1)
Immature Granulocytes: 0 %
Iron Saturation: 14 % — ABNORMAL LOW (ref 15–55)
Iron: 45 ug/dL (ref 27–159)
Lymphocytes Absolute: 2.2 10*3/uL (ref 0.7–3.1)
Lymphs: 25 %
MCH: 31.1 pg (ref 26.6–33.0)
MCHC: 34.3 g/dL (ref 31.5–35.7)
MCV: 91 fL (ref 79–97)
Monocytes Absolute: 0.7 10*3/uL (ref 0.1–0.9)
Monocytes: 8 %
Neutrophils Absolute: 5.6 10*3/uL (ref 1.4–7.0)
Neutrophils: 63 %
Platelets: 270 10*3/uL (ref 150–450)
RBC: 4.37 x10E6/uL (ref 3.77–5.28)
RDW: 13.1 % (ref 11.7–15.4)
Retic Ct Pct: 1.8 % (ref 0.6–2.6)
Total Iron Binding Capacity: 312 ug/dL (ref 250–450)
UIBC: 267 ug/dL (ref 131–425)
Vitamin B-12: 262 pg/mL (ref 232–1245)
WBC: 8.8 10*3/uL (ref 3.4–10.8)

## 2021-01-08 LAB — CMP14+EGFR
ALT: 26 IU/L (ref 0–32)
AST: 22 IU/L (ref 0–40)
Albumin/Globulin Ratio: 1.7 (ref 1.2–2.2)
Albumin: 4.3 g/dL (ref 3.8–4.9)
Alkaline Phosphatase: 140 IU/L — ABNORMAL HIGH (ref 44–121)
BUN/Creatinine Ratio: 17 (ref 9–23)
BUN: 13 mg/dL (ref 6–24)
Bilirubin Total: 0.4 mg/dL (ref 0.0–1.2)
CO2: 23 mmol/L (ref 20–29)
Calcium: 9.6 mg/dL (ref 8.7–10.2)
Chloride: 103 mmol/L (ref 96–106)
Creatinine, Ser: 0.76 mg/dL (ref 0.57–1.00)
GFR calc Af Amer: 101 mL/min/{1.73_m2} (ref >59)
GFR calc non Af Amer: 87 mL/min/{1.73_m2} (ref >59)
Globulin, Total: 2.6 g/dL (ref 1.5–4.5)
Glucose: 96 mg/dL (ref 65–99)
Potassium: 4.5 mmol/L (ref 3.5–5.2)
Sodium: 141 mmol/L (ref 134–144)
Total Protein: 6.9 g/dL (ref 6.0–8.5)

## 2021-01-08 LAB — TSH: TSH: 1.92 u[IU]/mL (ref 0.450–4.500)

## 2021-01-10 ENCOUNTER — Other Ambulatory Visit: Payer: Self-pay | Admitting: Family

## 2021-01-10 LAB — SPECIMEN STATUS REPORT

## 2021-01-10 LAB — GAMMA GT: GGT: 71 IU/L — ABNORMAL HIGH (ref 0–60)

## 2021-02-15 ENCOUNTER — Other Ambulatory Visit: Payer: Self-pay

## 2021-02-15 ENCOUNTER — Encounter: Payer: Self-pay | Admitting: Nurse Practitioner

## 2021-02-15 ENCOUNTER — Ambulatory Visit: Payer: BC Managed Care – PPO | Admitting: Nurse Practitioner

## 2021-02-15 VITALS — BP 114/74 | HR 66 | Temp 97.9°F | Ht 65.0 in | Wt 192.0 lb

## 2021-02-15 DIAGNOSIS — R079 Chest pain, unspecified: Secondary | ICD-10-CM | POA: Diagnosis not present

## 2021-02-15 MED ORDER — NITROGLYCERIN 0.4 MG SL SUBL: 0.4000 mg | SUBLINGUAL_TABLET | SUBLINGUAL | 3 refills | Status: DC | PRN

## 2021-02-15 MED ORDER — OMEPRAZOLE 20 MG PO CPDR
20.0000 mg | DELAYED_RELEASE_CAPSULE | Freq: Every day | ORAL | 2 refills | Status: DC
Start: 2021-02-15 — End: 2021-04-20

## 2021-02-15 NOTE — Patient Instructions (Addendum)
Gastroesophageal Reflux Disease, Adult  Gastroesophageal reflux (GER) happens when acid from the stomach flows up into the tube that connects the mouth and the stomach (esophagus). Normally, food travels down the esophagus and stays in the stomach to be digested. With GER, food and stomach acid sometimes move back up into the esophagus. You may have a disease called gastroesophageal reflux disease (GERD) if the reflux:  Happens often.  Causes frequent or very bad symptoms.  Causes problems such as damage to the esophagus. When this happens, the esophagus becomes sore and swollen. Over time, GERD can make small holes (ulcers) in the lining of the esophagus. What are the causes? This condition is caused by a problem with the muscle between the esophagus and the stomach. When this muscle is weak or not normal, it does not close properly to keep food and acid from coming back up from the stomach. The muscle can be weak because of:  Tobacco use.  Pregnancy.  Having a certain type of hernia (hiatal hernia).  Alcohol use.  Certain foods and drinks, such as coffee, chocolate, onions, and peppermint. What increases the risk?  Being overweight.  Having a disease that affects your connective tissue.  Taking NSAIDs, such a ibuprofen. What are the signs or symptoms?  Heartburn.  Difficult or painful swallowing.  The feeling of having a lump in the throat.  A bitter taste in the mouth.  Bad breath.  Having a lot of saliva.  Having an upset or bloated stomach.  Burping.  Chest pain. Different conditions can cause chest pain. Make sure you see your doctor if you have chest pain.  Shortness of breath or wheezing.  A long-term cough or a cough at night.  Wearing away of the surface of teeth (tooth enamel).  Weight loss. How is this treated?  Making changes to your diet.  Taking medicine.  Having surgery. Treatment will depend on how bad your symptoms are. Follow these  instructions at home: Eating and drinking  Follow a diet as told by your doctor. You may need to avoid foods and drinks such as: ? Coffee and tea, with or without caffeine. ? Drinks that contain alcohol. ? Energy drinks and sports drinks. ? Bubbly (carbonated) drinks or sodas. ? Chocolate and cocoa. ? Peppermint and mint flavorings. ? Garlic and onions. ? Horseradish. ? Spicy and acidic foods. These include peppers, chili powder, curry powder, vinegar, hot sauces, and BBQ sauce. ? Citrus fruit juices and citrus fruits, such as oranges, lemons, and limes. ? Tomato-based foods. These include red sauce, chili, salsa, and pizza with red sauce. ? Fried and fatty foods. These include donuts, french fries, potato chips, and high-fat dressings. ? High-fat meats. These include hot dogs, rib eye steak, sausage, ham, and bacon. ? High-fat dairy items, such as whole milk, butter, and cream cheese.  Eat small meals often. Avoid eating large meals.  Avoid drinking large amounts of liquid with your meals.  Avoid eating meals during the 2-3 hours before bedtime.  Avoid lying down right after you eat.  Do not exercise right after you eat.   Lifestyle  Do not smoke or use any products that contain nicotine or tobacco. If you need help quitting, ask your doctor.  Try to lower your stress. If you need help doing this, ask your doctor.  If you are overweight, lose an amount of weight that is healthy for you. Ask your doctor about a safe weight loss goal.   General instructions  Pay attention to any changes in your symptoms.  Take over-the-counter and prescription medicines only as told by your doctor.  Do not take aspirin, ibuprofen, or other NSAIDs unless your doctor says it is okay.  Wear loose clothes. Do not wear anything tight around your waist.  Raise (elevate) the head of your bed about 6 inches (15 cm). You may need to use a wedge to do this.  Avoid bending over if this makes your  symptoms worse.  Keep all follow-up visits. Contact a doctor if:  You have new symptoms.  You lose weight and you do not know why.  You have trouble swallowing or it hurts to swallow.  You have wheezing or a cough that keeps happening.  You have a hoarse voice.  Your symptoms do not get better with treatment. Get help right away if:  You have sudden pain in your arms, neck, jaw, teeth, or back.  You suddenly feel sweaty, dizzy, or light-headed.  You have chest pain or shortness of breath.  You vomit and the vomit is green, yellow, or black, or it looks like blood or coffee grounds.  You faint.  Your poop (stool) is red, bloody, or black.  You cannot swallow, drink, or eat. These symptoms may represent a serious problem that is an emergency. Do not wait to see if the symptoms will go away. Get medical help right away. Call your local emergency services (911 in the [place] Do not drive yourself to the hospital. Summary  If a person has gastroesophageal reflux disease (GERD), food and stomach acid move back up into the esophagus and cause symptoms or problems such as damage to the esophagus.  Treatment will depend on how bad your symptoms are.  Follow a diet as told by your doctor.  Take all medicines only as told by your doctor. This information is not intended to replace advice given to you by your health care provider. Make sure you discuss any questions you have with your health care provider. Document Revised: 05/10/2020 Document Reviewed: 05/10/2020 Elsevier Patient Education  2021 [place]. Chest Wall Pain Chest wall pain is pain in or around the bones and muscles of your chest. Chest wall pain may be caused by:  An injury.  Coughing a lot.  Using your chest and arm muscles too much. Sometimes, the cause may not be known. This pain may take a few weeks or longer to get better. Follow these instructions at home: Managing pain, stiffness, and swelling If  told, put ice on the painful area:  Put ice in a plastic bag.  Place a towel between your skin and the bag.  Leave the ice on for 20 minutes, 2-3 times a day.   Activity  Rest as told by your doctor.  Avoid doing things that cause pain. This includes lifting heavy items.  Ask your doctor what activities are safe for you. General instructions  Take over-the-counter and prescription medicines only as told by your doctor.  Do not use any products that contain nicotine or tobacco, such as cigarettes, e-cigarettes, and chewing tobacco. If you need help quitting, ask your doctor.  Keep all follow-up visits as told by your doctor. This is important.   Contact a doctor if:  You have a fever.  Your chest pain gets worse.  You have new symptoms. Get help right away if:  You feel sick to your stomach (nauseous) or you throw up (vomit).  You feel sweaty or light-headed.  You  have a cough with mucus from your lungs (sputum) or you cough up blood.  You are short of breath. These symptoms may be an emergency. Do not wait to see if the symptoms will go away. Get medical help right away. Call your local emergency services (911 in the [place] Do not drive yourself to the hospital. Summary  Chest wall pain is pain in or around the bones and muscles of your chest.  It may be treated with ice, rest, and medicines. Your condition may also get better if you avoid doing things that cause pain.  Contact a doctor if you have a fever, chest pain that gets worse, or new symptoms.  Get help right away if you feel light-headed or you get short of breath. These symptoms may be an emergency. This information is not intended to replace advice given to you by your health care provider. Make sure you discuss any questions you have with your health care provider. Document Revised: 05/02/2018 Document Reviewed: 05/02/2018 Elsevier Patient Education  2021 [employer].

## 2021-02-15 NOTE — Progress Notes (Signed)
Acute Office Visit  Subjective:    Patient ID: Carla Morales, female    DOB: [birth year], 60 y.o.   MRN: [id number]  Chief Complaint  Patient presents with  . Chest Pain    Chest Pain  This is a new problem. Episode onset: The past 2 days. The onset quality is gradual. The problem occurs intermittently. The problem has been gradually improving. The pain is present in the substernal region. The pain is mild. The quality of the pain is described as burning and pressure. The pain radiates to the upper back. Associated symptoms include back pain. Pertinent negatives include no cough, irregular heartbeat, palpitations or shortness of breath. The pain is aggravated by nothing. She has tried nothing for the symptoms.  Her past medical history is significant for anxiety/panic attacks.     Past Medical History:  Diagnosis Date  . Allergy   . Arthritis   . Asthma   . COVID-19   . Diverticulosis   . Elevated alkaline phosphatase level   . Elevated antinuclear antibody (ANA) level   . GERD (gastroesophageal reflux disease)   . Hyperlipidemia   . Internal hemorrhoids   . Migraine   . Pinched nerve in neck   . Schatzki's ring   . Sinus congestion   . Urinary incontinence     Past Surgical History:  Procedure Laterality Date  . bladder dialation  1968   as a child  . COLONOSCOPY    . TONSILLECTOMY  1971  . TOTAL VAGINAL HYSTERECTOMY  07/2010   menorrhagia, leiomyoma, prolapse  . TUBAL LIGATION  1998    Family History  Problem Relation Age of Onset  . Hypertension Father   . Melanoma Father 56  . Rheum arthritis Mother   . Fibromyalgia Mother   . Cancer Mother        breast, uterine, adenocarcinoma-right lung  . Uterine cancer Mother        Sarcoma  . Lung cancer Mother        Adenocarcinoma  . Breast cancer Mother 29  . Other Daughter        Phyllodes tumor  . Diabetes Maternal Aunt   . Diabetes Maternal Uncle   . Diabetes Paternal Aunt   . Breast cancer  Paternal Aunt 48  . Diabetes Paternal Uncle   . Heart disease Maternal Grandfather   . Asthma Sister   . Other Maternal Grandmother 30       childbirth  . Cancer Paternal Grandmother        unknown  . Cancer Maternal Aunt 53       GYN cancer - possibly vulvar?  . Appendicitis Maternal Uncle        d. 12  . Breast cancer Paternal Aunt 66  . Uterine cancer Paternal Aunt 93  . Breast cancer Paternal Aunt 23  . Graves' disease Nephew   . Colon cancer Neg Hx   . Esophageal cancer Neg Hx   . Ulcerative colitis Neg Hx   . Stomach cancer Neg Hx     Social History   Socioeconomic History  . Marital status: Married    Spouse name: Not on file  . Number of children: Not on file  . Years of education: Not on file  . Highest education level: Not on file  Occupational History  . Not on file  Tobacco Use  . Smoking status: Never Smoker  . Smokeless tobacco: Never Used  Vaping Use  . Vaping Use: Never  used  Substance and Sexual Activity  . Alcohol use: No    Alcohol/week: 0.0 standard drinks  . Drug use: No  . Sexual activity: Yes    Partners: Male    Birth control/protection: Surgical    Comment: hyst.-1st intercourse 89 yo-1 partner  Other Topics Concern  . Not on file  Social History Narrative  . Not on file   Social Determinants of Health   Financial Resource Strain: Not on file  Food Insecurity: Not on file  Transportation Needs: Not on file  Physical Activity: Not on file  Stress: Not on file  Social Connections: Not on file  Intimate Partner Violence: Not on file    Outpatient Medications Prior to Visit  Medication Sig Dispense Refill  . ALPRAZolam (XANAX) 0.5 MG tablet Take 1 tablet (0.5 mg total) by mouth at bedtime as needed for anxiety. 20 tablet 2  . atorvastatin (LIPITOR) 10 MG tablet TAKE 1 TABLET BY MOUTH EVERY DAY 30 tablet 4  . beclomethasone (QVAR REDIHALER) 80 MCG/ACT inhaler Inhale 2 puffs into the lungs 2 (two) times daily. 1 each 3  .  diphenhydrAMINE (BENADRYL) 12.5 MG/5ML liquid Take by mouth at bedtime as needed.    Marland Kitchen estradiol (VIVELLE-DOT) 0.075 MG/24HR UNWRAP AND APPLY 1 PATCH TO SKIN 2 TIMES PER WEEK. 24 patch 3  . fluticasone (FLONASE) 50 MCG/ACT nasal spray USE 2 SPAYRS IN EACH NOSTRIL DAILY. GENERIC EQUIVALENT FOR FLONASE 48 g 1  . loratadine (CLARITIN) 10 MG tablet Take 10 mg by mouth daily.    . meloxicam (MOBIC) 15 MG tablet Take 1 tablet (15 mg total) by mouth daily. 90 tablet 0  . VENTOLIN HFA 108 (90 Base) MCG/ACT inhaler TAKE 2 PUFFS BY MOUTH EVERY 6 HOURS AS NEEDED FOR WHEEZE OR SHORTNESS OF BREATH 8 g 11  . Vitamin D, Ergocalciferol, (DRISDOL) 1.25 MG (50000 UNIT) CAPS capsule TAKE 1 CAPSULE BY MOUTH EVERY 7 DAYS GENERIC EQUIVALENT FOR DRISDOL 12 capsule 1  . phentermine (ADIPEX-P) 37.5 MG tablet Take 1 tablet (37.5 mg total) by mouth daily before breakfast. (Patient not taking: Reported on 02/15/2021) 30 tablet 2   No facility-administered medications prior to visit.    Allergies  Allergen Reactions  . Other Swelling    Contrast dye  . Contrast Media [Iodinated Diagnostic Agents]   . Haldol [Haloperidol Decanoate]     anxiety  . Pravastatin Other (See Comments)    myalgias  . Sulfa Antibiotics Rash    Review of Systems  Constitutional: Negative.   HENT: Negative.   Respiratory: Negative for cough and shortness of breath.   Cardiovascular: Positive for chest pain. Negative for palpitations.  Genitourinary: Negative.   Musculoskeletal: Positive for back pain.  Psychiatric/Behavioral: Negative.   All other systems reviewed and are negative.      Objective:    Physical Exam Vitals reviewed.  Constitutional:      Appearance: She is well-developed.  HENT:     Head: Normocephalic.  Eyes:     Conjunctiva/sclera: Conjunctivae normal.  Cardiovascular:     Rate and Rhythm: Normal rate and regular rhythm.     Pulses: Normal pulses.     Heart sounds: Normal heart sounds.  Pulmonary:      Effort: Pulmonary effort is normal.     Breath sounds: Normal breath sounds.  Abdominal:     General: Bowel sounds are normal.  Musculoskeletal:        General: Tenderness present.     Cervical back: Normal  range of motion.  Neurological:     Mental Status: She is alert and oriented to person, place, and time.  Psychiatric:     Comments: History of anxiety     BP 114/74   Pulse 66   Temp 97.9 F (36.6 C) (Temporal)   Ht 5\' 5"  (1.651 m)   Wt 192 lb (87.1 kg)   LMP 07/24/2010   SpO2 99%   BMI 31.95 kg/m  Wt Readings from Last 3 Encounters:  02/15/21 192 lb (87.1 kg)  01/07/21 196 lb (88.9 kg)  11/26/20 195 lb (88.5 kg)    There are no preventive care reminders to display for this patient.  There are no preventive care reminders to display for this patient.   Lab Results  Component Value Date   TSH 1.920 01/07/2021   Lab Results  Component Value Date   WBC 8.8 01/07/2021   HGB 13.6 01/07/2021   HCT 39.7 01/07/2021   MCV 91 01/07/2021   PLT 270 01/07/2021   Lab Results  Component Value Date   NA 141 01/07/2021   K 4.5 01/07/2021   CO2 23 01/07/2021   GLUCOSE 96 01/07/2021   BUN 13 01/07/2021   CREATININE 0.76 01/07/2021   BILITOT 0.4 01/07/2021   ALKPHOS 140 (H) 01/07/2021   AST 22 01/07/2021   ALT 26 01/07/2021   PROT 6.9 01/07/2021   ALBUMIN 4.3 01/07/2021   CALCIUM 9.6 01/07/2021   Lab Results  Component Value Date   CHOL 156 04/27/2020   Lab Results  Component Value Date   HDL 49 04/27/2020   Lab Results  Component Value Date   LDLCALC 74 04/27/2020   Lab Results  Component Value Date   TRIG 196 (H) 04/27/2020   Lab Results  Component Value Date   CHOLHDL 3.2 04/27/2020   No results found for: HGBA1C     Assessment & Plan:   Problem List Items Addressed This Visit      Other   Chest pain - Primary    New chest pain in the last 3 days.  Completed twelve-lead EKG results are normal.  I am treating patient today for acid reflux  based on patient's symptoms.  Patient is also reporting increased anxiety from life changes and family stress.  Discussed with patient the reevaluating anxiety and completing new PHQ scale/GAD-7 will improve management of anxiety long term.  Patient is currently on 0.5 mg Xanax once daily as needed.  Omeprazole 20 mg tablet by mouth daily, nitroglycerin as needed.  Advised patient to call 911 with unresolved chest pain, nausea/any signs or symptoms of heart attack.  Education provided to patient with printed handouts given.      Relevant Medications   omeprazole (PRILOSEC) 20 MG capsule   nitroGLYCERIN (NITROSTAT) 0.4 MG SL tablet   Other Relevant Orders   EKG 12-Lead (Completed)       Meds ordered this encounter  Medications  . omeprazole (PRILOSEC) 20 MG capsule    Sig: Take 1 capsule (20 mg total) by mouth daily.    Dispense:  60 capsule    Refill:  2    Order Specific Question:   Supervising Provider    Answer:   Janora Norlander [[id number]]  . nitroGLYCERIN (NITROSTAT) 0.4 MG SL tablet    Sig: Place 1 tablet (0.4 mg total) under the tongue every 5 (five) minutes as needed for chest pain.    Dispense:  50 tablet    Refill:  3  Order Specific Question:   Supervising Provider    Answer:   Janora Norlander [[id number]]     Ivy Lynn, NP

## 2021-02-15 NOTE — Assessment & Plan Note (Addendum)
New chest pain in the last 3 days.  Completed twelve-lead EKG results are normal.  I am treating patient today for acid reflux based on patient's symptoms.  Patient is also reporting increased anxiety from life changes and family stress.  Discussed with patient the reevaluating anxiety and completing new PHQ scale/GAD-7 will improve management of anxiety long term.  Patient is currently on 0.5 mg Xanax once daily as needed.  Omeprazole 20 mg tablet by mouth daily, nitroglycerin as needed.  Advised patient to call 911 with unresolved chest pain, nausea/any signs or symptoms of heart attack.  Education provided to patient with printed handouts given.

## 2021-02-22 ENCOUNTER — Telehealth: Payer: Self-pay | Admitting: *Deleted

## 2021-02-22 MED ORDER — ESTRADIOL 0.075 MG/24HR TD PTTW
MEDICATED_PATCH | TRANSDERMAL | 3 refills | Status: DC
Start: 2021-02-22 — End: 2021-07-11

## 2021-02-22 NOTE — Telephone Encounter (Signed)
Patient called requesting Rx for vivelle dot patches to be sent to [facility]. Rx sent.

## 2021-03-08 ENCOUNTER — Other Ambulatory Visit: Payer: Self-pay | Admitting: Family

## 2021-03-11 ENCOUNTER — Encounter: Payer: Self-pay | Admitting: Nurse Practitioner

## 2021-03-11 ENCOUNTER — Ambulatory Visit: Payer: BC Managed Care – PPO | Admitting: Nurse Practitioner

## 2021-03-11 DIAGNOSIS — J4531 Mild persistent asthma with (acute) exacerbation: Secondary | ICD-10-CM | POA: Diagnosis not present

## 2021-03-11 MED ORDER — PREDNISONE 10 MG (21) PO TBPK: ORAL_TABLET | ORAL | 0 refills | Status: DC

## 2021-03-11 NOTE — Progress Notes (Signed)
   Virtual Visit  Note Due to COVID-19 pandemic this visit was conducted virtually. This visit type was conducted due to national recommendations for restrictions regarding the COVID-19 Pandemic (e.g. social distancing, sheltering in place) in an effort to limit this patient's exposure and mitigate transmission in our community. All issues noted in this document were discussed and addressed.  A physical exam was not performed with this format.  I connected with Carla Morales on 03/11/21 at 10:38 by telephone and verified that I am speaking with the correct person using two identifiers. Carla Morales is currently located at home and no noe is currently with  her during visit. The provider, Mary-Margaret Hassell Done, FNP is located in their office at time of visit.  I discussed the limitations, risks, security and privacy concerns of performing an evaluation and management service by telephone and the availability of in person appointments. I also discussed with the patient that there may be a patient responsible charge related to this service. The patient expressed understanding and agreed to proceed.   History and Present Illness:   Chief Complaint: URI   HPI Patient does telephone visit today c/o cough that started over a week ago. The cough is constant. Thinks is allergy related, but she has asthma. She is on qvar daily nad has been having to use albuterol 3-4x a dya the last several days.    Review of Systems  Constitutional: Positive for malaise/fatigue. Negative for chills and fever.  HENT: Negative for congestion and sore throat.   Respiratory: Positive for cough, sputum production (occasionally), shortness of breath and wheezing.   Neurological: Negative for headaches.  All other systems reviewed and are negative.    Observations/Objective: Alert and oriented- answers all questions appropriately No distress Dry sounding cough during visit Slight hoarse   Assessment  and Plan: Carla Morales in today with chief complaint of URI   1. Mild persistent asthmatic bronchitis with acute exacerbation Force fluids Continue flonase and claritin OTC Meds ordered this encounter  Medications  . predniSONE (STERAPRED UNI-PAK 21 TAB) 10 MG (21) TBPK tablet    Sig: As directed x 6 days    Dispense:  21 tablet    Refill:  0    Order Specific Question:   Supervising Provider    Answer:   Caryl Pina A [[id number]]  If not improving will need a face to face visit     Follow Up Instructions: prn    I discussed the assessment and treatment plan with the patient. The patient was provided an opportunity to ask questions and all were answered. The patient agreed with the plan and demonstrated an understanding of the instructions.   The patient was advised to call back or seek an in-person evaluation if the symptoms worsen or if the condition fails to improve as anticipated.  The above assessment and management plan was discussed with the patient. The patient verbalized understanding of and has agreed to the management plan. Patient is aware to call the clinic if symptoms persist or worsen. Patient is aware when to return to the clinic for a follow-up visit. Patient educated on when it is appropriate to go to the emergency department.   Time call ended:  10:50  I provided 12 minutes of  non face-to-face time during this encounter.    Mary-Margaret Hassell Done, FNP

## 2021-03-17 DIAGNOSIS — J4521 Mild intermittent asthma with (acute) exacerbation: Secondary | ICD-10-CM | POA: Diagnosis not present

## 2021-03-17 DIAGNOSIS — B370 Candidal stomatitis: Secondary | ICD-10-CM | POA: Diagnosis not present

## 2021-03-17 DIAGNOSIS — J029 Acute pharyngitis, unspecified: Secondary | ICD-10-CM | POA: Diagnosis not present

## 2021-03-17 DIAGNOSIS — R059 Cough, unspecified: Secondary | ICD-10-CM | POA: Diagnosis not present

## 2021-03-20 DIAGNOSIS — H5203 Hypermetropia, bilateral: Secondary | ICD-10-CM | POA: Diagnosis not present

## 2021-03-24 ENCOUNTER — Other Ambulatory Visit: Payer: Self-pay | Admitting: Family

## 2021-03-29 ENCOUNTER — Other Ambulatory Visit: Payer: Self-pay | Admitting: Family

## 2021-03-29 DIAGNOSIS — J301 Allergic rhinitis due to pollen: Secondary | ICD-10-CM

## 2021-04-04 ENCOUNTER — Telehealth: Payer: BC Managed Care – PPO | Admitting: Physician Assistant

## 2021-04-04 DIAGNOSIS — J019 Acute sinusitis, unspecified: Secondary | ICD-10-CM

## 2021-04-04 MED ORDER — AMOXICILLIN-POT CLAVULANATE 875-125 MG PO TABS: 1.0000 | ORAL_TABLET | Freq: Two times a day (BID) | ORAL | 0 refills | Status: DC

## 2021-04-04 NOTE — Progress Notes (Signed)

## 2021-04-20 ENCOUNTER — Other Ambulatory Visit: Payer: Self-pay | Admitting: *Deleted

## 2021-04-20 DIAGNOSIS — J301 Allergic rhinitis due to pollen: Secondary | ICD-10-CM

## 2021-04-20 DIAGNOSIS — R0981 Nasal congestion: Secondary | ICD-10-CM

## 2021-04-20 DIAGNOSIS — R079 Chest pain, unspecified: Secondary | ICD-10-CM

## 2021-04-20 MED ORDER — OMEPRAZOLE 20 MG PO CPDR: 20.0000 mg | DELAYED_RELEASE_CAPSULE | Freq: Every day | ORAL | 0 refills | Status: DC

## 2021-04-20 MED ORDER — QVAR REDIHALER 80 MCG/ACT IN AERB: 2.0000 | INHALATION_SPRAY | Freq: Two times a day (BID) | RESPIRATORY_TRACT | 0 refills | Status: DC

## 2021-04-20 MED ORDER — FLUTICASONE PROPIONATE 50 MCG/ACT NA SUSP: NASAL | 1 refills | Status: DC

## 2021-04-20 MED ORDER — ALBUTEROL SULFATE HFA 108 (90 BASE) MCG/ACT IN AERS: INHALATION_SPRAY | RESPIRATORY_TRACT | 0 refills | Status: DC

## 2021-04-20 MED ORDER — ATORVASTATIN CALCIUM 10 MG PO TABS: 1.0000 | ORAL_TABLET | Freq: Every day | ORAL | 0 refills | Status: DC

## 2021-04-28 ENCOUNTER — Other Ambulatory Visit: Payer: BC Managed Care – PPO

## 2021-04-28 ENCOUNTER — Ambulatory Visit: Payer: BC Managed Care – PPO | Admitting: Family

## 2021-05-05 ENCOUNTER — Other Ambulatory Visit: Payer: Self-pay

## 2021-05-05 ENCOUNTER — Encounter: Payer: Self-pay | Admitting: Family Medicine

## 2021-05-05 ENCOUNTER — Ambulatory Visit: Payer: BC Managed Care – PPO | Admitting: Family Medicine

## 2021-05-05 VITALS — BP 135/74 | HR 69 | Temp 98.3°F

## 2021-05-05 DIAGNOSIS — U071 COVID-19: Secondary | ICD-10-CM

## 2021-05-05 DIAGNOSIS — J029 Acute pharyngitis, unspecified: Secondary | ICD-10-CM | POA: Diagnosis not present

## 2021-05-05 LAB — CULTURE, GROUP A STREP

## 2021-05-05 LAB — RAPID STREP SCREEN (MED CTR MEBANE ONLY): Strep Gp A Ag, IA W/Reflex: NEGATIVE

## 2021-05-05 MED ORDER — DEXAMETHASONE 6 MG PO TABS: 6.0000 mg | ORAL_TABLET | Freq: Every day | ORAL | 0 refills | Status: AC

## 2021-05-05 MED ORDER — AZITHROMYCIN 250 MG PO TABS
ORAL_TABLET | ORAL | 0 refills | Status: DC
Start: 2021-05-05 — End: 2021-07-11

## 2021-05-05 NOTE — Progress Notes (Signed)
Assessment & Plan:  1. COVID-19 Symptom management. Continue inhalers for asthma.  - azithromycin (ZITHROMAX Z-PAK) 250 MG tablet; Take 2 tablets (500 mg) PO today, then 1 tablet (250 mg) PO daily x4 days.  Dispense: 6 tablet; Refill: 0 - dexamethasone (DECADRON) 6 MG tablet; Take 1 tablet (6 mg total) by mouth daily for 5 days.  Dispense: 5 tablet; Refill: 0  2. Sore throat - Rapid Strep Screen ([facility] ONLY) - Culture, Group A Strep   Follow up plan: Return if symptoms worsen or fail to improve.  Hendricks Limes, MSN, APRN, FNP-C [facility]  Subjective:   Patient ID: Carla Morales, female    DOB: January 22, 1961, 60 y.o.   MRN: [id number]  HPI: Carla Morales is a 60 y.o. female presenting on 05/05/2021 for chest congestion (Tested positive on Saturday. Symptoms started wed- thur last week ), Sore Throat, Ear Pain, and Cough  Patient complains of cough, chest congestion, headache, sore throat, and ear pain/pressure. Onset of symptoms was 8 days ago, gradually worsening since that time. She is drinking plenty of fluids. Evaluation to date: tested positive for COVID 5 days ago. Treatment to date:  inhalers . She has a history of asthma. She does not smoke. Patient has been fully vaccinated against COVID-19.   ROS: Negative unless specifically indicated above in HPI.   Relevant past medical history reviewed and updated as indicated.   Allergies and medications reviewed and updated.   Current Outpatient Medications:    albuterol (VENTOLIN HFA) 108 (90 Base) MCG/ACT inhaler, TAKE 2 PUFFS BY MOUTH EVERY 6 HOURS AS NEEDED FOR WHEEZE OR SHORTNESS OF BREATH, Disp: 54 g, Rfl: 0   ALPRAZolam (XANAX) 0.5 MG tablet, Take 1 tablet (0.5 mg total) by mouth at bedtime as needed for anxiety., Disp: 20 tablet, Rfl: 2   atorvastatin (LIPITOR) 10 MG tablet, Take 1 tablet (10 mg total) by mouth daily., Disp: 90 tablet, Rfl: 0   beclomethasone (QVAR REDIHALER) 80  MCG/ACT inhaler, Inhale 2 puffs into the lungs 2 (two) times daily., Disp: 31.8 g, Rfl: 0   diphenhydrAMINE (BENADRYL) 12.5 MG/5ML liquid, Take by mouth at bedtime as needed., Disp: , Rfl:    estradiol (VIVELLE-DOT) 0.075 MG/24HR, UNWRAP AND APPLY 1 PATCH TO SKIN 2 TIMES PER WEEK., Disp: 24 patch, Rfl: 3   fluticasone (FLONASE) 50 MCG/ACT nasal spray, USE 2 SPAYRS IN EACH NOSTRIL DAILY. GENERIC EQUIVALENT FOR FLONASE, Disp: 48 g, Rfl: 1   loratadine (CLARITIN) 10 MG tablet, Take 10 mg by mouth daily., Disp: , Rfl:    meloxicam (MOBIC) 15 MG tablet, Take 1 tablet (15 mg total) by mouth daily., Disp: 90 tablet, Rfl: 0   nitroGLYCERIN (NITROSTAT) 0.4 MG SL tablet, Place 1 tablet (0.4 mg total) under the tongue every 5 (five) minutes as needed for chest pain., Disp: 50 tablet, Rfl: 3   omeprazole (PRILOSEC) 20 MG capsule, Take 1 capsule (20 mg total) by mouth daily., Disp: 180 capsule, Rfl: 0  Allergies  Allergen Reactions   Other Swelling    Contrast dye   Contrast Media [Iodinated Diagnostic Agents]    Haldol [Haloperidol Decanoate]     anxiety   Pravastatin Other (See Comments)    myalgias   Sulfa Antibiotics Rash    Objective:   BP 135/74   Pulse 69   Temp 98.3 F (36.8 C) (Temporal)   LMP 07/24/2010   SpO2 96%    Physical Exam Vitals reviewed.  Constitutional:  General: She is not in acute distress.    Appearance: Normal appearance. She is not ill-appearing, toxic-appearing or diaphoretic.  HENT:     Head: Normocephalic and atraumatic.     Right Ear: Tympanic membrane, ear canal and external ear normal. There is no impacted cerumen.     Left Ear: Tympanic membrane, ear canal and external ear normal. There is no impacted cerumen.     Mouth/Throat:     Pharynx: Oropharyngeal exudate and posterior oropharyngeal erythema present.  Eyes:     General: No scleral icterus.       Right eye: No discharge.        Left eye: No discharge.     Conjunctiva/sclera: Conjunctivae  normal.  Cardiovascular:     Rate and Rhythm: Normal rate and regular rhythm.     Heart sounds: Normal heart sounds. No murmur heard.   No friction rub. No gallop.  Pulmonary:     Effort: Pulmonary effort is normal. No respiratory distress.     Breath sounds: Normal breath sounds. No stridor. No wheezing, rhonchi or rales.  Musculoskeletal:        General: Normal range of motion.     Cervical back: Normal range of motion.  Skin:    General: Skin is warm and dry.     Capillary Refill: Capillary refill takes less than 2 seconds.  Neurological:     General: No focal deficit present.     Mental Status: She is alert and oriented to person, place, and time. Mental status is at baseline.  Psychiatric:        Mood and Affect: Mood normal.        Behavior: Behavior normal.        Thought Content: Thought content normal.        Judgment: Judgment normal.

## 2021-05-09 LAB — CULTURE, GROUP A STREP: Strep A Culture: NEGATIVE

## 2021-05-10 ENCOUNTER — Encounter: Payer: Self-pay | Admitting: Family Medicine

## 2021-05-11 MED ORDER — NYSTATIN 100000 UNIT/ML MT SUSP: 5.0000 mL | Freq: Four times a day (QID) | OROMUCOSAL | 0 refills | Status: AC

## 2021-05-12 ENCOUNTER — Other Ambulatory Visit: Payer: Self-pay | Admitting: Family

## 2021-07-11 ENCOUNTER — Encounter: Payer: Self-pay | Admitting: Family

## 2021-07-11 ENCOUNTER — Other Ambulatory Visit: Payer: Self-pay

## 2021-07-11 ENCOUNTER — Ambulatory Visit: Payer: BC Managed Care – PPO | Admitting: Family

## 2021-07-11 VITALS — BP 125/75 | HR 81 | Temp 97.8°F | Ht 65.0 in | Wt 198.0 lb

## 2021-07-11 DIAGNOSIS — E559 Vitamin D deficiency, unspecified: Secondary | ICD-10-CM

## 2021-07-11 DIAGNOSIS — K219 Gastro-esophageal reflux disease without esophagitis: Secondary | ICD-10-CM

## 2021-07-11 DIAGNOSIS — E669 Obesity, unspecified: Secondary | ICD-10-CM | POA: Diagnosis not present

## 2021-07-11 DIAGNOSIS — J4520 Mild intermittent asthma, uncomplicated: Secondary | ICD-10-CM

## 2021-07-11 DIAGNOSIS — E7800 Pure hypercholesterolemia, unspecified: Secondary | ICD-10-CM

## 2021-07-11 DIAGNOSIS — R609 Edema, unspecified: Secondary | ICD-10-CM

## 2021-07-11 DIAGNOSIS — R600 Localized edema: Secondary | ICD-10-CM | POA: Insufficient documentation

## 2021-07-11 MED ORDER — HYDROCHLOROTHIAZIDE 12.5 MG PO TABS: 12.5000 mg | ORAL_TABLET | Freq: Every day | ORAL | 3 refills | Status: DC

## 2021-07-11 NOTE — Progress Notes (Signed)
Subjective:    Patient ID: Carla Morales, female    DOB: 03/09/1961, 60 y.o.   MRN: [id number]  Chief Complaint  Patient presents with   Medical Management of Chronic Issues   Foot Swelling    In both right is th worse for years but has gotten worse    Pt presents to the office today for chronic follow up. She is followed by GYN annually. She currently wearing estradiol patches for menopause.   She is complaining of bilateral leg swelling that has been on going for a year, but over the last few months it has worsen.  Asthma She complains of wheezing. This is a chronic problem. The current episode started more than 1 year ago. The problem occurs intermittently. Associated symptoms include heartburn. She reports moderate improvement on treatment. Her past medical history is significant for asthma.  Gastroesophageal Reflux She complains of belching, heartburn and wheezing. This is a chronic problem. The current episode started more than 1 year ago. The problem occurs occasionally. She has tried a PPI for the symptoms. The treatment provided moderate relief.  Hyperlipidemia This is a chronic problem. The current episode started more than 1 year ago. Exacerbating diseases include obesity. Current antihyperlipidemic treatment includes statins. The current treatment provides moderate improvement of lipids. Risk factors for coronary artery disease include dyslipidemia and a sedentary lifestyle.     Review of Systems  Respiratory:  Positive for wheezing.   Gastrointestinal:  Positive for heartburn.  All other systems reviewed and are negative.     Objective:   Physical Exam Vitals reviewed.  Constitutional:      General: She is not in acute distress.    Appearance: She is well-developed.  HENT:     Head: Normocephalic and atraumatic.     Right Ear: Tympanic membrane normal.     Left Ear: Tympanic membrane normal.  Eyes:     Pupils: Pupils are equal, round, and reactive to  light.  Neck:     Thyroid: No thyromegaly.  Cardiovascular:     Rate and Rhythm: Normal rate and regular rhythm.     Heart sounds: Normal heart sounds. No murmur heard. Pulmonary:     Effort: Pulmonary effort is normal. No respiratory distress.     Breath sounds: Normal breath sounds. No wheezing.  Abdominal:     General: Bowel sounds are normal. There is no distension.     Palpations: Abdomen is soft.     Tenderness: There is no abdominal tenderness.  Musculoskeletal:        General: No tenderness. Normal range of motion.     Cervical back: Normal range of motion and neck supple.     Right lower leg: Edema (2+) present.     Left lower leg: Edema (1+) present.  Skin:    General: Skin is warm and dry.  Neurological:     Mental Status: She is alert and oriented to person, place, and time.     Cranial Nerves: No cranial nerve deficit.     Deep Tendon Reflexes: Reflexes are normal and symmetric.  Psychiatric:        Behavior: Behavior normal.        Thought Content: Thought content normal.        Judgment: Judgment normal.       BP 125/75   Pulse 81   Temp 97.8 F (36.6 C) (Temporal)   Ht 5\' 5"  (1.651 m)   Wt 198 lb (89.8 kg)  LMP 07/24/2010   BMI 32.95 kg/m   Assessment & Plan:  Ryanna Teschner comes in today with chief complaint of Medical Management of Chronic Issues and Foot Swelling (In both right is th worse for years but has gotten worse )   Diagnosis and orders addressed:  1. Mild intermittent asthma without complication - LLV74+XVEZ - CBC with Differential/Platelet  2. Gastroesophageal reflux disease, unspecified whether esophagitis present - CMP14+EGFR - CBC with Differential/Platelet  3. Obesity (BMI 30-39.9) - CMP14+EGFR - CBC with Differential/Platelet  4. Pure hypercholesterolemia - CMP14+EGFR - CBC with Differential/Platelet  5. Vitamin D deficiency - CMP14+EGFR - CBC with Differential/Platelet  6. Peripheral edema Start HCTZ 12.5  mg  Start wearing compression hose daily Low salt diet - CMP14+EGFR - CBC with Differential/Platelet - hydrochlorothiazide (HYDRODIURIL) 12.5 MG tablet; Take 1 tablet (12.5 mg total) by mouth daily.  Dispense: 90 tablet; Refill: 3   Labs pending Health Maintenance reviewed Diet and exercise encouraged  Follow up plan: 6 months    [name], FNP

## 2021-07-11 NOTE — Patient Instructions (Addendum)
Peripheral Edema Peripheral edema is swelling that is caused by a buildup of fluid. Peripheral edema most often affects the lower legs, ankles, and feet. It can also develop in the arms, hands, and face. The area of the body that has peripheral edema will look swollen. It may also feel heavy or warm. Your clothes may start to feel tight. Pressing on the area may make a temporary dent in your skin. You may not be able to move your swollen arm or leg as much as usual. There are many causes of peripheral edema. It can happen because of a complication of other conditions such as congestive heart failure, kidney disease, or a problem with your blood circulation. It also can be a side effect of certain medicines or because of an infection. It often happens to women during pregnancy. Sometimes, the cause is not known. Follow these instructions at home: Managing pain, stiffness, and swelling  Raise (elevate) your legs while you are sitting or lying down. Move around often to prevent stiffness and to lessen swelling. Do not sit or stand for long periods of time. Wear support stockings as told by your health care provider. Medicines Take over-the-counter and prescription medicines only as told by your health care provider. Your health care provider may prescribe medicine to help your body get rid of excess water (diuretic). General instructions Pay attention to any changes in your symptoms. Follow instructions from your health care provider about limiting salt (sodium) in your diet. Sometimes, eating less salt may reduce swelling. Moisturize skin daily to help prevent skin from cracking and draining. Keep all follow-up visits as told by your health care provider. This is important. Contact a health care provider if you have: A fever. Edema that starts suddenly or is getting worse, especially if you are pregnant or have a medical condition. Swelling in only one leg. Increased swelling, redness, or pain in  one or both of your legs. Drainage or sores at the area where you have edema. Get help right away if you: Develop shortness of breath, especially when you are lying down. Have pain in your chest or abdomen. Feel weak. Feel faint. Summary Peripheral edema is swelling that is caused by a buildup of fluid. Peripheral edema most often affects the lower legs, ankles, and feet. Move around often to prevent stiffness and to lessen swelling. Do not sit or stand for long periods of time. Pay attention to any changes in your symptoms. Contact a health care provider if you have edema that starts suddenly or is getting worse, especially if you are pregnant or have a medical condition. Get help right away if you develop shortness of breath, especially when lying down. This information is not intended to replace advice given to you by your health care provider. Make sure you discuss any questions you have with your health care provider. Document Revised: 07/24/2018 Document Reviewed: 07/24/2018 Elsevier Patient Education  2022 Elsevier Inc.  

## 2021-07-12 LAB — CMP14+EGFR
ALT: 22 IU/L (ref 0–32)
AST: 19 IU/L (ref 0–40)
Albumin/Globulin Ratio: 1.4 (ref 1.2–2.2)
Albumin: 4.2 g/dL (ref 3.8–4.9)
Alkaline Phosphatase: 125 IU/L — ABNORMAL HIGH (ref 44–121)
BUN/Creatinine Ratio: 15 (ref 9–23)
BUN: 11 mg/dL (ref 6–24)
Bilirubin Total: 0.3 mg/dL (ref 0.0–1.2)
CO2: 24 mmol/L (ref 20–29)
Calcium: 9.2 mg/dL (ref 8.7–10.2)
Chloride: 103 mmol/L (ref 96–106)
Creatinine, Ser: 0.75 mg/dL (ref 0.57–1.00)
Globulin, Total: 2.9 g/dL (ref 1.5–4.5)
Glucose: 118 mg/dL — ABNORMAL HIGH (ref 65–99)
Potassium: 3.9 mmol/L (ref 3.5–5.2)
Sodium: 140 mmol/L (ref 134–144)
Total Protein: 7.1 g/dL (ref 6.0–8.5)
eGFR: 93 mL/min/{1.73_m2} (ref >59)

## 2021-07-12 LAB — CBC WITH DIFFERENTIAL/PLATELET
Basophils Absolute: 0.1 10*3/uL (ref 0.0–0.2)
Basos: 1 %
EOS (ABSOLUTE): 0.3 10*3/uL (ref 0.0–0.4)
Eos: 4 %
Hematocrit: 38.4 % (ref 34.0–46.6)
Hemoglobin: 12.8 g/dL (ref 11.1–15.9)
Immature Grans (Abs): 0 10*3/uL (ref 0.0–0.1)
Immature Granulocytes: 0 %
Lymphocytes Absolute: 2.7 10*3/uL (ref 0.7–3.1)
Lymphs: 33 %
MCH: 30.5 pg (ref 26.6–33.0)
MCHC: 33.3 g/dL (ref 31.5–35.7)
MCV: 92 fL (ref 79–97)
Monocytes Absolute: 0.6 10*3/uL (ref 0.1–0.9)
Monocytes: 7 %
Neutrophils Absolute: 4.5 10*3/uL (ref 1.4–7.0)
Neutrophils: 55 %
Platelets: 279 10*3/uL (ref 150–450)
RBC: 4.19 x10E6/uL (ref 3.77–5.28)
RDW: 13.1 % (ref 11.7–15.4)
WBC: 8.2 10*3/uL (ref 3.4–10.8)

## 2021-09-23 ENCOUNTER — Other Ambulatory Visit: Payer: Self-pay | Admitting: Family

## 2021-10-04 ENCOUNTER — Telehealth: Payer: BC Managed Care – PPO | Admitting: Physician Assistant

## 2021-10-04 DIAGNOSIS — J208 Acute bronchitis due to other specified organisms: Secondary | ICD-10-CM

## 2021-10-04 DIAGNOSIS — B9689 Other specified bacterial agents as the cause of diseases classified elsewhere: Secondary | ICD-10-CM

## 2021-10-04 MED ORDER — BENZONATATE 100 MG PO CAPS
100.0000 mg | ORAL_CAPSULE | Freq: Three times a day (TID) | ORAL | 0 refills | Status: DC | PRN
Start: 2021-10-04 — End: 2021-12-15

## 2021-10-04 MED ORDER — AZITHROMYCIN 250 MG PO TABS: ORAL_TABLET | ORAL | 0 refills | Status: AC

## 2021-10-04 MED ORDER — PREDNISONE 10 MG (21) PO TBPK: ORAL_TABLET | ORAL | 0 refills | Status: DC

## 2021-10-04 MED ORDER — ALBUTEROL SULFATE HFA 108 (90 BASE) MCG/ACT IN AERS: 2.0000 | INHALATION_SPRAY | Freq: Four times a day (QID) | RESPIRATORY_TRACT | 0 refills | Status: DC | PRN

## 2021-10-04 NOTE — Progress Notes (Signed)

## 2021-10-19 DIAGNOSIS — H40033 Anatomical narrow angle, bilateral: Secondary | ICD-10-CM | POA: Diagnosis not present

## 2021-10-19 DIAGNOSIS — H2513 Age-related nuclear cataract, bilateral: Secondary | ICD-10-CM | POA: Diagnosis not present

## 2021-12-05 ENCOUNTER — Other Ambulatory Visit: Payer: Self-pay | Admitting: Family

## 2021-12-05 DIAGNOSIS — Z1231 Encounter for screening mammogram for malignant neoplasm of breast: Secondary | ICD-10-CM

## 2021-12-15 ENCOUNTER — Other Ambulatory Visit: Payer: Self-pay

## 2021-12-15 ENCOUNTER — Ambulatory Visit (INDEPENDENT_AMBULATORY_CARE_PROVIDER_SITE_OTHER): Payer: BC Managed Care – PPO | Admitting: Radiology

## 2021-12-15 ENCOUNTER — Encounter: Payer: Self-pay | Admitting: Radiology

## 2021-12-15 VITALS — BP 118/80 | Ht 64.5 in | Wt 194.0 lb

## 2021-12-15 DIAGNOSIS — Z23 Encounter for immunization: Secondary | ICD-10-CM

## 2021-12-15 DIAGNOSIS — Z01419 Encounter for gynecological examination (general) (routine) without abnormal findings: Secondary | ICD-10-CM | POA: Diagnosis not present

## 2021-12-15 DIAGNOSIS — R8271 Bacteriuria: Secondary | ICD-10-CM | POA: Diagnosis not present

## 2021-12-15 DIAGNOSIS — M545 Low back pain, unspecified: Secondary | ICD-10-CM | POA: Diagnosis not present

## 2021-12-15 NOTE — Progress Notes (Signed)
° °  Carla Morales [facility] [birth year] [id number]   History:  61 y.o. G 1p1 presents for annual exam. C/o back pain, GI irregularity, worsening over the past few weeks. Gynecologic History Hysterectomy: DUB, fibroids  Sexually active: yes, no problems, no need for extra lubricant  Health Maintenance Last Pap: 2019. Results were: neg Last mammogram: 12/15/20. Results were: neg Last colonoscopy: 2020. Results were: neg Last Dexa: 2022. Results were: osteopenia  Past medical history, past surgical history, family history and social history were all reviewed and documented in the EPIC chart.  ROS:  A ROS was performed and pertinent positives and negatives are included. + irregular bowels since gallbladder removal and before Exam:  U/A +bacteria will send culture   Vitals:   12/15/21 0754  BP: 118/80  Weight: 194 lb (88 kg)  Height: 5' 4.5" (1.638 m)   Body mass index is 32.79 kg/m.  General appearance:  Normal Thyroid:  Symmetrical, normal in size, without palpable masses or nodularity. Respiratory  Auscultation:  Clear without wheezing or rhonchi Cardiovascular  Auscultation:  Regular rate, without rubs, murmurs or gallops  Edema/varicosities:  Not grossly evident Abdominal  Soft,nontender, without masses, guarding or rebound.  Liver/spleen:  No organomegaly noted  Hernia:  None appreciated  Skin  Inspection:  Grossly normal Breasts: Examined lying and sitting.   Right: Without masses, retractions, nipple discharge or axillary adenopathy.   Left: Without masses, retractions, nipple discharge or axillary adenopathy. Genitourinary   Inguinal/mons:  Normal without inguinal adenopathy  External genitalia:  Normal appearing vulva with no masses, tenderness, or lesions  BUS/Urethra/Skene's glands:  Normal  Vagina:  Normal appearing with normal color and discharge, no lesions. Atrophy mild  Cervix:  absent  Uterus:  absent  Adnexa/parametria:     Rt: Normal in size, without  masses or tenderness.   Lt: Normal in size, without masses or tenderness.  Anus and perineum: Normal  Digital rectal exam: Normal sphincter tone without palpated masses or tenderness  Patient informed chaperone available to be present for breast and pelvic exam. Patient has requested no chaperone to be present. Patient has been advised what will be completed during breast and pelvic exam.   Assessment/Plan:  61 y.o. G 1P1 for annual exam.   Will send urine C+S Begin daily probiotic Increase fluid intake daily  Discussed SBE, colonoscopy and DEXA screening as appropriate. Encouraged 137mins/week of cardiovascular and weight bearing exercise minimum. Recommend the use of seatbelts and sunscreen consistently.   Return in 1 year for annual or sooner prn.  Rubbie Battiest B WHNP-BC 8:05 AM 12/15/2021

## 2021-12-18 LAB — URINALYSIS, COMPLETE W/RFL CULTURE
Glucose, UA: NEGATIVE
Hgb urine dipstick: NEGATIVE
Hyaline Cast: NONE SEEN /LPF
Leukocyte Esterase: NEGATIVE
Nitrites, Initial: NEGATIVE
Protein, ur: NEGATIVE
Specific Gravity, Urine: 1.025 (ref 1.001–1.035)
pH: 5.5 (ref 5.0–8.0)

## 2021-12-18 LAB — URINE CULTURE
MICRO NUMBER:: 12954734
SPECIMEN QUALITY:: ADEQUATE

## 2021-12-18 LAB — CULTURE INDICATED

## 2021-12-19 ENCOUNTER — Ambulatory Visit
Admission: RE | Admit: 2021-12-19 | Discharge: 2021-12-19 | Disposition: A | Discharge status: Home / Self Care | Payer: BC Managed Care – PPO | Source: Ambulatory Visit | Attending: Family | Admitting: Family

## 2021-12-19 ENCOUNTER — Other Ambulatory Visit: Payer: Self-pay

## 2021-12-19 DIAGNOSIS — Z1231 Encounter for screening mammogram for malignant neoplasm of breast: Secondary | ICD-10-CM | POA: Diagnosis not present

## 2021-12-20 ENCOUNTER — Other Ambulatory Visit: Payer: Self-pay | Admitting: Radiology

## 2021-12-20 DIAGNOSIS — N3000 Acute cystitis without hematuria: Secondary | ICD-10-CM

## 2021-12-20 MED ORDER — NITROFURANTOIN MONOHYD MACRO 100 MG PO CAPS: 100.0000 mg | ORAL_CAPSULE | Freq: Two times a day (BID) | ORAL | 0 refills | Status: DC

## 2022-01-03 ENCOUNTER — Other Ambulatory Visit: Payer: Self-pay | Admitting: Family

## 2022-01-09 ENCOUNTER — Ambulatory Visit: Payer: BC Managed Care – PPO | Admitting: Family

## 2022-01-09 ENCOUNTER — Encounter: Payer: Self-pay | Admitting: Family

## 2022-01-09 VITALS — BP 104/72 | HR 80 | Temp 97.8°F | Ht 64.5 in | Wt 195.0 lb

## 2022-01-09 DIAGNOSIS — R7401 Elevation of levels of liver transaminase levels: Secondary | ICD-10-CM | POA: Diagnosis not present

## 2022-01-09 DIAGNOSIS — E669 Obesity, unspecified: Secondary | ICD-10-CM | POA: Diagnosis not present

## 2022-01-09 DIAGNOSIS — E7800 Pure hypercholesterolemia, unspecified: Secondary | ICD-10-CM | POA: Diagnosis not present

## 2022-01-09 DIAGNOSIS — R300 Dysuria: Secondary | ICD-10-CM | POA: Diagnosis not present

## 2022-01-09 DIAGNOSIS — J019 Acute sinusitis, unspecified: Secondary | ICD-10-CM

## 2022-01-09 DIAGNOSIS — R768 Other specified abnormal immunological findings in serum: Secondary | ICD-10-CM

## 2022-01-09 DIAGNOSIS — R748 Abnormal levels of other serum enzymes: Secondary | ICD-10-CM

## 2022-01-09 LAB — MICROSCOPIC EXAMINATION
RBC, Urine: NONE SEEN /hpf (ref 0–2)
Renal Epithel, UA: NONE SEEN /hpf

## 2022-01-09 LAB — URINALYSIS, COMPLETE
Bilirubin, UA: NEGATIVE
Glucose, UA: NEGATIVE
Leukocytes,UA: NEGATIVE
Nitrite, UA: NEGATIVE
RBC, UA: NEGATIVE
Specific Gravity, UA: 1.025 (ref 1.005–1.030)
Urobilinogen, Ur: 0.2 mg/dL (ref 0.2–1.0)
pH, UA: 5.5 (ref 5.0–7.5)

## 2022-01-09 LAB — LIPID PANEL

## 2022-01-09 MED ORDER — AMOXICILLIN-POT CLAVULANATE 875-125 MG PO TABS: 1.0000 | ORAL_TABLET | Freq: Two times a day (BID) | ORAL | 0 refills | Status: DC

## 2022-01-09 MED ORDER — ATORVASTATIN CALCIUM 10 MG PO TABS: 10.0000 mg | ORAL_TABLET | Freq: Every day | ORAL | 2 refills | Status: DC

## 2022-01-09 NOTE — Progress Notes (Signed)
° °Subjective:  ° ° Patient ID: Carla Morales, female    DOB: 02/18/1961, 60 y.o.   MRN: [id number] ° °Pt presents to the office with sinus issues. She reports she had a UTI and took Macrobid for 7 days starting on 12/20/21. She reports she is feeling better, but still having frequency.  ° °She also had an elevated ANA in the past and wants this retested today. She states she had recently had COVID and was curious if that caused the elevation.  °Sinus Problem °This is a new problem. The current episode started 1 to 4 weeks ago. The problem has been gradually worsening since onset. There has been no fever. Her pain is at a severity of 6/10. The pain is mild. Associated symptoms include congestion, coughing, headaches, a hoarse voice, sinus pressure, sneezing and a sore throat. Pertinent negatives include no chills or ear pain. Past treatments include oral decongestants. The treatment provided mild relief.  °Dysuria  °This is a chronic problem. The current episode started more than 1 year ago. The problem has been resolved. Associated symptoms include frequency. Pertinent negatives include no chills. She has tried increased fluids and antibiotics for the symptoms. The treatment provided mild relief.  °Hyperlipidemia °This is a chronic problem. The current episode started more than 1 year ago. Exacerbating diseases include obesity. She has no history of nephrotic syndrome. Current antihyperlipidemic treatment includes statins. The current treatment provides moderate improvement of lipids. Risk factors for coronary artery disease include dyslipidemia, a sedentary lifestyle and post-menopausal.  ° ° ° °Review of Systems  °Constitutional:  Negative for chills.  °HENT:  Positive for congestion, hoarse voice, sinus pressure, sneezing and sore throat. Negative for ear pain.   °Respiratory:  Positive for cough.   °Genitourinary:  Positive for dysuria and frequency.  °Neurological:  Positive for headaches.  °All other  systems reviewed and are negative. ° °   °Objective:  ° Physical Exam °Vitals reviewed.  °Constitutional:   °   General: She is not in acute distress. °   Appearance: She is well-developed. She is obese.  °HENT:  °   Head: Normocephalic and atraumatic.  °   Right Ear: Tympanic membrane normal.  °   Left Ear: Tympanic membrane normal.  °Eyes:  °   Pupils: Pupils are equal, round, and reactive to light.  °Neck:  °   Thyroid: No thyromegaly.  °Cardiovascular:  °   Rate and Rhythm: Normal rate and regular rhythm.  °   Heart sounds: Normal heart sounds. No murmur heard. °Pulmonary:  °   Effort: Pulmonary effort is normal. No respiratory distress.  °   Breath sounds: Normal breath sounds. No wheezing.  °Abdominal:  °   General: Bowel sounds are normal. There is no distension.  °   Palpations: Abdomen is soft.  °   Tenderness: There is no abdominal tenderness.  °Musculoskeletal:     °   General: No tenderness. Normal range of motion.  °   Cervical back: Normal range of motion and neck supple.  °Skin: °   General: Skin is warm and dry.  °Neurological:  °   Mental Status: She is alert and oriented to person, place, and time.  °   Cranial Nerves: No cranial nerve deficit.  °   Deep Tendon Reflexes: Reflexes are normal and symmetric.  °Psychiatric:     °   Behavior: Behavior normal.     °   Thought Content: Thought content normal.     °     Judgment: Judgment normal.  ° ° ° °BP 104/72    Pulse 80    Temp 97.8 °F (36.6 °C) (Temporal)    Ht 5' 4.5" (1.638 m)    Wt 195 lb (88.5 kg)    LMP 07/24/2010    BMI 32.95 kg/m²  ° ° °   °Assessment & Plan:  ° °Carla Morales comes in today with chief complaint of Urinary Tract Infection, Sinus Problem (STARTED OVER A WEEK AGO ), and Medical Management of Chronic Issues ° ° °Diagnosis and orders addressed: ° °1. Dysuria °-Culture pending °- Urinalysis, Complete °- Urine Culture °- CMP14+EGFR ° °2. Acute sinusitis, recurrence not specified, unspecified location °Will give Augmentin she  will wait for 1-2 days to see if pain improves °Continue Mucinex  °- amoxicillin-clavulanate (AUGMENTIN) 875-125 MG tablet; Take 1 tablet by mouth 2 (two) times daily.  Dispense: 14 tablet; Refill: 0 °- CMP14+EGFR ° °3. Pure hypercholesterolemia °- CMP14+EGFR °- Lipid panel °- atorvastatin (LIPITOR) 10 MG tablet; Take 1 tablet (10 mg total) by mouth daily.  Dispense: 90 tablet; Refill: 2 ° °4. Obesity (BMI 30-39.9) °- CMP14+EGFR ° °5. ANA positive °- CMP14+EGFR °- ANA ° ° °Labs pending °Health Maintenance reviewed °Diet and exercise encouraged ° °Follow up plan: °6 months  ° ° °Christy Hawks, FNP ° ° °

## 2022-01-09 NOTE — Patient Instructions (Signed)

## 2022-01-10 ENCOUNTER — Other Ambulatory Visit: Payer: Self-pay | Admitting: Family

## 2022-01-10 LAB — CMP14+EGFR
ALT: 35 IU/L — ABNORMAL HIGH (ref 0–32)
AST: 24 IU/L (ref 0–40)
Albumin/Globulin Ratio: 1.4 (ref 1.2–2.2)
Albumin: 4.3 g/dL (ref 3.8–4.9)
Alkaline Phosphatase: 200 IU/L — ABNORMAL HIGH (ref 44–121)
BUN/Creatinine Ratio: 14 (ref 9–23)
BUN: 11 mg/dL (ref 6–24)
Bilirubin Total: 0.3 mg/dL (ref 0.0–1.2)
CO2: 23 mmol/L (ref 20–29)
Calcium: 10.2 mg/dL (ref 8.7–10.2)
Chloride: 105 mmol/L (ref 96–106)
Creatinine, Ser: 0.81 mg/dL (ref 0.57–1.00)
Globulin, Total: 3.1 g/dL (ref 1.5–4.5)
Glucose: 122 mg/dL — ABNORMAL HIGH (ref 70–99)
Potassium: 4.3 mmol/L (ref 3.5–5.2)
Sodium: 145 mmol/L — ABNORMAL HIGH (ref 134–144)
Total Protein: 7.4 g/dL (ref 6.0–8.5)
eGFR: 84 mL/min/{1.73_m2} (ref >59)

## 2022-01-10 LAB — LIPID PANEL
Chol/HDL Ratio: 3.8 ratio (ref 0.0–4.4)
Cholesterol, Total: 183 mg/dL (ref 100–199)
HDL: 48 mg/dL (ref >39)
LDL Chol Calc (NIH): 98 mg/dL (ref 0–99)
Triglycerides: 218 mg/dL — ABNORMAL HIGH (ref 0–149)
VLDL Cholesterol Cal: 37 mg/dL (ref 5–40)

## 2022-01-10 LAB — ANA: Anti Nuclear Antibody (ANA): NEGATIVE

## 2022-01-11 LAB — URINE CULTURE

## 2022-01-12 LAB — SPECIMEN STATUS REPORT

## 2022-01-12 LAB — GAMMA GT: GGT: 193 IU/L — ABNORMAL HIGH (ref 0–60)

## 2022-01-13 ENCOUNTER — Other Ambulatory Visit: Payer: Self-pay | Admitting: Family

## 2022-01-13 DIAGNOSIS — Z1211 Encounter for screening for malignant neoplasm of colon: Secondary | ICD-10-CM

## 2022-01-13 NOTE — Addendum Note (Signed)
Addended by: Ladean Raya on: 01/13/2022 09:17 AM   Modules accepted: Orders

## 2022-01-18 ENCOUNTER — Ambulatory Visit: Payer: BC Managed Care – PPO | Admitting: Physician Assistant

## 2022-01-23 NOTE — Progress Notes (Unsigned)
01/23/2022 Liddie Chichester [id number] 08/28/1961   ASSESSMENT AND PLAN:  *** There are no diagnoses linked to this encounter.   History of Present Illness:  61 y.o. female  with a past medical history of asthma, reflux, hyperlipidemia, and others listed below, known to Dr. Hilarie Fredrickson returns to clinic today for evaluation of ***.  Last seen in the office by Nicoletta Ba 01/2020 for elevated liver function, patient had positive ANA but negative mitochondrial antibodies, anti-smooth muscle, hepatitis C, abdominal ultrasound showed 2.7 cm gallstone, patient had cholecystectomy.   Last CMET was 01/09/2022 and showed alkaline phosphatase 200, ALT 35, AST 24, total bilirubin 0.3.  GGT elevated at 193.  10/06/2019 colonoscopy and endoscopy Colonoscopy showed diverticulosis, small internal hemorrhoids Patient had 6 tubular adenomas removed-recall 3 years or 10/05/2022. Endoscopy for dysphagia showed LA grade a nonobstructing mild Schatzki ring dilated to 18 mm. Current Medications:    Current Outpatient Medications (Cardiovascular):    atorvastatin (LIPITOR) 10 MG tablet, Take 1 tablet (10 mg total) by mouth daily.   hydrochlorothiazide (HYDRODIURIL) 12.5 MG tablet, Take 1 tablet (12.5 mg total) by mouth daily.   nitroGLYCERIN (NITROSTAT) 0.4 MG SL tablet, Place 1 tablet (0.4 mg total) under the tongue every 5 (five) minutes as needed for chest pain.  Current Outpatient Medications (Respiratory):    albuterol (VENTOLIN HFA) 108 (90 Base) MCG/ACT inhaler, TAKE 2 PUFFS BY MOUTH EVERY 6 HOURS AS NEEDED FOR WHEEZE OR SHORTNESS OF BREATH   diphenhydrAMINE (BENADRYL) 12.5 MG/5ML liquid, Take by mouth at bedtime as needed.   fluticasone (FLONASE) 50 MCG/ACT nasal spray, USE 2 SPAYRS IN EACH NOSTRIL DAILY. GENERIC EQUIVALENT FOR FLONASE    Current Outpatient Medications (Other):    ALPRAZolam (XANAX) 0.5 MG tablet, Take 1 tablet (0.5 mg total) by mouth at bedtime as needed for anxiety.  (Patient not taking: Reported on 01/09/2022)   amoxicillin-clavulanate (AUGMENTIN) 875-125 MG tablet, Take 1 tablet by mouth 2 (two) times daily.   omeprazole (PRILOSEC) 20 MG capsule, Take 1 capsule (20 mg total) by mouth daily.  Surgical History:  She  has a past surgical history that includes Tonsillectomy (1971); bladder dialation (1968); Tubal ligation (1998); Total vaginal hysterectomy (07/2010); Colonoscopy; and Abdominal hysterectomy. Family History:  Her family history includes Appendicitis in her maternal uncle; Asthma in her sister; Breast cancer (age of onset: 17) in her paternal aunt; Breast cancer (age of onset: 65) in her paternal aunt; Breast cancer (age of onset: 38) in her mother and paternal aunt; Cancer in her mother and paternal grandmother; Cancer (age of onset: 7) in her maternal aunt; Diabetes in her maternal aunt, maternal uncle, paternal aunt, and paternal uncle; Fibromyalgia in her mother; Berenice Primas' disease in her nephew; Heart disease in her maternal grandfather; Hypertension in her father; Lung cancer in her mother; Melanoma (age of onset: 5) in her father; Other in her daughter; Other (age of onset: 48) in her maternal grandmother; Rheum arthritis in her mother; Uterine cancer in her mother; Uterine cancer (age of onset: 41) in her paternal aunt. Social History:   reports that she has never smoked. She has never used smokeless tobacco. She reports that she does not drink alcohol and does not use drugs.  Current Medications, Allergies, Past Medical History, Past Surgical History, Family History and Social History were reviewed in [employer] record.  Physical Exam: LMP 07/24/2010  General:   Pleasant, well developed female in no acute distress Eyes: {sclerae:26738},conjunctive {conjuctiva:26739}  Heart:  {HEART  EXAM HEM/ONC:21750} Pulm: Clear anteriorly; no wheezing Abdomen:  {BlankSingle:19197::"Distended","Ridged","Soft"},  {BlankSingle:19197::"Flat","Obese"} AB, skin exam {ABDOMEN SKIN EXAM:22649}, {BlankSingle:19197::"Absent","Hyperactive, tinkling","Hypoactive","Sluggish","Normal"} bowel sounds. {Desc; pc desc - abdomen tenderness:5168} tenderness {anatomy; site abdomen:5010}. {BlankMultiple:19196::"Without guarding","With guarding","Without rebound","With rebound"}, {Exam; abdomen organomegaly:15152}. Extremities:  {With/Without:304960234} edema. Peripheral pulses intact.  Neurologic:  Alert and  oriented x4;  grossly normal neurologically. Skin:   Dry and intact without significant lesions or rashes. Psychiatric: Demonstrates good judgement and reason without abnormal affect or behaviors.  Vladimir Crofts, PA-C 01/23/22

## 2022-01-25 ENCOUNTER — Encounter: Payer: Self-pay | Admitting: Physician Assistant

## 2022-01-25 ENCOUNTER — Ambulatory Visit: Payer: BC Managed Care – PPO | Admitting: Physician Assistant

## 2022-01-25 ENCOUNTER — Other Ambulatory Visit (INDEPENDENT_AMBULATORY_CARE_PROVIDER_SITE_OTHER): Payer: BC Managed Care – PPO

## 2022-01-25 VITALS — BP 124/80 | HR 91 | Ht 65.0 in | Wt 192.8 lb

## 2022-01-25 DIAGNOSIS — K760 Fatty (change of) liver, not elsewhere classified: Secondary | ICD-10-CM

## 2022-01-25 DIAGNOSIS — R748 Abnormal levels of other serum enzymes: Secondary | ICD-10-CM

## 2022-01-25 DIAGNOSIS — Z86010 Personal history of colonic polyps: Secondary | ICD-10-CM

## 2022-01-25 LAB — HEPATIC FUNCTION PANEL
ALT: 26 U/L (ref 0–35)
AST: 21 U/L (ref 0–37)
Albumin: 4.6 g/dL (ref 3.5–5.2)
Alkaline Phosphatase: 134 U/L — ABNORMAL HIGH (ref 39–117)
Bilirubin, Direct: 0.1 mg/dL (ref 0.0–0.3)
Total Bilirubin: 0.6 mg/dL (ref 0.2–1.2)
Total Protein: 7.7 g/dL (ref 6.0–8.3)

## 2022-01-25 LAB — FERRITIN: Ferritin: 124.5 ng/mL (ref 10.0–291.0)

## 2022-01-25 NOTE — Patient Instructions (Signed)
You have been scheduled for an abdominal ultrasound at [facility] (1st floor of hospital) on 01/27/2022 at 11am. Please arrive 15 minutes prior to your appointment for registration. Make certain not to have anything to eat or drink 6 hours prior to your appointment. Should you need to reschedule your appointment, please contact radiology at [phone]. This test typically takes about 30 minutes to perform.  ? ?Your provider has requested that you go to the basement level for lab work before leaving today. Press "B" on the elevator. The lab is located at the first door on the left as you exit the elevator.  ? ?Due to recent changes in healthcare laws, you may see the results of your imaging and laboratory studies on MyChart before your provider has had a chance to review them.  We understand that in some cases there may be results that are confusing or concerning to you. Not all laboratory results come back in the same time frame and the provider may be waiting for multiple results in order to interpret others.  Please give [place] 48 hours in order for your provider to thoroughly review all the results before contacting the office for clarification of your results.   ? ?If you are age 59 or older, your body mass index should be between 23-30. Your Body mass index is 32.08 kg/m?Marland Kitchen If this is out of the aforementioned range listed, please consider follow up with your Primary Care Provider. ? ?If you are age 59 or younger, your body mass index should be between 19-25. Your Body mass index is 32.08 kg/m?Marland Kitchen If this is out of the aformentioned range listed, please consider follow up with your Primary Care Provider.  ? ?________________________________________________________ ? ?The [facility] providers would like to encourage you to use [facility] to communicate with providers for non-urgent requests or questions.  Due to long hold times on the telephone, sending your provider a message by [facility] may be a faster and  more efficient way to get a response.  Please allow 48 business hours for a response.  Please remember that this is for non-urgent requests.  ?_______________________________________________________  ? ? ? ? ? ?Due for your colonoscopy 09/2022 ? ?Fatty liver or Nonalcoholic fatty liver disease (NASH)  ?Now the leading cause of liver failure in the [place].  ?It is normally from such risk factors as obesity, diabetes, insulin resistance, high cholesterol, or metabolic syndrome.  ?The only definitive therapy is weight loss and exercise.  ? ? ?Suggest walking 20-30 mins daily.  ?Decreasing carbohydrates, increasing veggies.  ? ? ?Fatty Liver ?Fatty liver is the accumulation of fat in liver cells. It is also called hepatosteatosis or steatohepatitis. It is normal for your liver to contain some fat. If fat is more than 5 to 10% of your liver's weight, you have fatty liver.  ?There are often no symptoms (problems) for years while damage is still occurring. People often learn about their fatty liver when they have medical tests for other reasons. Fat can damage your liver for years or even decades without causing problems. When it becomes severe, it can cause fatigue, weight loss, weakness, and confusion. ?This makes you more likely to develop more serious liver problems. The liver is the largest organ in the body. It does a lot of work and often gives no warning signs when it is sick until late in a disease. ?The liver has many important jobs including: ?Breaking down foods. ?Storing vitamins, iron, and other minerals. ?Making  proteins. ?Making bile for food digestion. ?Breaking down many products including medications, alcohol and some poisons. ? ?PROGNOSIS  ?Fatty liver may cause no damage or it can lead to an inflammation of the liver. This is, called steatohepatitis.  Over time the liver may become scarred and hardened. This condition is called cirrhosis. Cirrhosis is serious and may lead to liver failure or  cancer. NASH is one of the leading causes of cirrhosis. About 10-20% of Americans have fatty liver and a smaller 2-5% has NASH. ? ?TREATMENT  ?Weight loss, fat restriction, and exercise in overweight patients produces inconsistent results but is worth trying. ?Good control of diabetes may reduce fatty liver. ?Eat a balanced, healthy diet. ?Increase your physical activity. ?There are no medical or surgical treatments for a fatty liver or NASH, but improving your diet and increasing your exercise may help prevent or reverse some of the damage. ? ? ?FIBER SUPPLEMENT ? ?Benefiber or Citracel is good for constipation/diarrhea/irritable bowel syndrome, it helps with weight loss and can help lower your bad cholesterol. Please do 1 TBSP in the morning in water, coffee, or tea. It can take up to a month before you can see a difference with your bowel movements. It is cheapest from [employer], [employer], [employer].  ? ?

## 2022-01-26 LAB — HEPATITIS PANEL, ACUTE
Hep A IgM: NONREACTIVE
Hep B C IgM: NONREACTIVE
Hepatitis B Surface Ag: NONREACTIVE
Hepatitis C Ab: NONREACTIVE
SIGNAL TO CUT-OFF: 0.04 (ref <1.00)

## 2022-01-26 LAB — IGG: IgG (Immunoglobin G), Serum: 1543 mg/dL (ref 600–1640)

## 2022-01-26 NOTE — Progress Notes (Signed)
Addendum: Reviewed and agree with assessment and management plan. Cicley Ganesh M, MD  

## 2022-01-27 ENCOUNTER — Other Ambulatory Visit: Payer: Self-pay

## 2022-01-27 ENCOUNTER — Ambulatory Visit (HOSPITAL_COMMUNITY)
Admission: RE | Admit: 2022-01-27 | Discharge: 2022-01-27 | Disposition: A | Discharge status: Home / Self Care | Payer: BC Managed Care – PPO | Source: Ambulatory Visit | Attending: Physician Assistant | Admitting: Physician Assistant

## 2022-01-27 DIAGNOSIS — R748 Abnormal levels of other serum enzymes: Secondary | ICD-10-CM | POA: Insufficient documentation

## 2022-01-27 DIAGNOSIS — K760 Fatty (change of) liver, not elsewhere classified: Secondary | ICD-10-CM | POA: Diagnosis not present

## 2022-01-27 DIAGNOSIS — Z9049 Acquired absence of other specified parts of digestive tract: Secondary | ICD-10-CM | POA: Diagnosis not present

## 2022-01-29 LAB — IRON AND TIBC
Iron Saturation: 21 % (ref 15–55)
Iron: 69 ug/dL (ref 27–159)
Total Iron Binding Capacity: 326 ug/dL (ref 250–450)
UIBC: 257 ug/dL (ref 131–425)

## 2022-01-29 LAB — ALKALINE PHOSPHATASE, ISOENZYMES
Alkaline Phosphatase: 151 IU/L — ABNORMAL HIGH (ref 44–121)
BONE FRACTION: 24 % (ref 14–68)
INTESTINAL FRAC.: 3 % (ref 0–18)
LIVER FRACTION: 73 % (ref 18–85)

## 2022-02-08 DIAGNOSIS — Z6830 Body mass index (BMI) 30.0-30.9, adult: Secondary | ICD-10-CM | POA: Diagnosis not present

## 2022-02-08 DIAGNOSIS — J9801 Acute bronchospasm: Secondary | ICD-10-CM | POA: Diagnosis not present

## 2022-02-08 DIAGNOSIS — R051 Acute cough: Secondary | ICD-10-CM | POA: Diagnosis not present

## 2022-02-08 DIAGNOSIS — J40 Bronchitis, not specified as acute or chronic: Secondary | ICD-10-CM | POA: Diagnosis not present

## 2022-03-12 ENCOUNTER — Other Ambulatory Visit: Payer: Self-pay | Admitting: Family

## 2022-03-12 DIAGNOSIS — R079 Chest pain, unspecified: Secondary | ICD-10-CM

## 2022-03-12 DIAGNOSIS — R0981 Nasal congestion: Secondary | ICD-10-CM

## 2022-03-12 DIAGNOSIS — J301 Allergic rhinitis due to pollen: Secondary | ICD-10-CM

## 2022-03-13 NOTE — Telephone Encounter (Signed)
NTBS 30 days given  ?

## 2022-03-13 NOTE — Telephone Encounter (Signed)
Called pt--she said that she was seen 01/09/2022 and had labs on 01/10/2022. Does she still need an apt? If so, please call back to explain. ?

## 2022-05-03 DIAGNOSIS — L570 Actinic keratosis: Secondary | ICD-10-CM | POA: Diagnosis not present

## 2022-05-03 DIAGNOSIS — D225 Melanocytic nevi of trunk: Secondary | ICD-10-CM | POA: Diagnosis not present

## 2022-05-03 DIAGNOSIS — X32XXXD Exposure to sunlight, subsequent encounter: Secondary | ICD-10-CM | POA: Diagnosis not present

## 2022-05-03 DIAGNOSIS — Z1283 Encounter for screening for malignant neoplasm of skin: Secondary | ICD-10-CM | POA: Diagnosis not present

## 2022-05-03 DIAGNOSIS — L298 Other pruritus: Secondary | ICD-10-CM | POA: Diagnosis not present

## 2022-06-27 ENCOUNTER — Other Ambulatory Visit: Payer: Self-pay | Admitting: Family

## 2022-06-27 DIAGNOSIS — R079 Chest pain, unspecified: Secondary | ICD-10-CM

## 2022-06-28 MED ORDER — OMEPRAZOLE 20 MG PO CPDR: 20.0000 mg | DELAYED_RELEASE_CAPSULE | Freq: Every day | ORAL | 0 refills | Status: DC

## 2022-06-28 NOTE — Telephone Encounter (Signed)
[place] Last OV 07/11/21. Mail order NOT sent

## 2022-06-28 NOTE — Addendum Note (Signed)
Addended by: Antonietta Barcelona D on: 06/28/2022 11:42 AM   Modules accepted: Orders

## 2022-06-28 NOTE — Telephone Encounter (Signed)
Made appt for Aug 31

## 2022-07-13 ENCOUNTER — Encounter: Payer: Self-pay | Admitting: Family

## 2022-07-13 ENCOUNTER — Ambulatory Visit: Payer: BC Managed Care – PPO | Admitting: Family

## 2022-07-13 VITALS — BP 104/62 | HR 78 | Temp 98.3°F | Ht 65.0 in | Wt 183.2 lb

## 2022-07-13 DIAGNOSIS — Z0000 Encounter for general adult medical examination without abnormal findings: Secondary | ICD-10-CM

## 2022-07-13 DIAGNOSIS — Z1211 Encounter for screening for malignant neoplasm of colon: Secondary | ICD-10-CM

## 2022-07-13 DIAGNOSIS — Z0001 Encounter for general adult medical examination with abnormal findings: Secondary | ICD-10-CM

## 2022-07-13 DIAGNOSIS — M542 Cervicalgia: Secondary | ICD-10-CM | POA: Diagnosis not present

## 2022-07-13 DIAGNOSIS — J4520 Mild intermittent asthma, uncomplicated: Secondary | ICD-10-CM

## 2022-07-13 DIAGNOSIS — E669 Obesity, unspecified: Secondary | ICD-10-CM

## 2022-07-13 DIAGNOSIS — E7800 Pure hypercholesterolemia, unspecified: Secondary | ICD-10-CM | POA: Diagnosis not present

## 2022-07-13 DIAGNOSIS — E559 Vitamin D deficiency, unspecified: Secondary | ICD-10-CM | POA: Diagnosis not present

## 2022-07-13 DIAGNOSIS — R3915 Urgency of urination: Secondary | ICD-10-CM

## 2022-07-13 DIAGNOSIS — K219 Gastro-esophageal reflux disease without esophagitis: Secondary | ICD-10-CM | POA: Diagnosis not present

## 2022-07-13 DIAGNOSIS — R1310 Dysphagia, unspecified: Secondary | ICD-10-CM

## 2022-07-13 LAB — MICROSCOPIC EXAMINATION
RBC, Urine: NONE SEEN /hpf (ref 0–2)
Renal Epithel, UA: NONE SEEN /hpf
WBC, UA: NONE SEEN /hpf (ref 0–5)

## 2022-07-13 LAB — URINALYSIS, COMPLETE
Bilirubin, UA: NEGATIVE
Glucose, UA: NEGATIVE
Leukocytes,UA: NEGATIVE
Nitrite, UA: NEGATIVE
Protein,UA: NEGATIVE
RBC, UA: NEGATIVE
Specific Gravity, UA: 1.02 (ref 1.005–1.030)
Urobilinogen, Ur: 2 mg/dL — ABNORMAL HIGH (ref 0.2–1.0)
pH, UA: 6 (ref 5.0–7.5)

## 2022-07-13 MED ORDER — OMEPRAZOLE 40 MG PO CPDR: 40.0000 mg | DELAYED_RELEASE_CAPSULE | Freq: Every day | ORAL | 3 refills | Status: DC

## 2022-07-13 NOTE — Patient Instructions (Signed)
Dysphagia  Dysphagia is trouble swallowing. This condition occurs when solids and liquids stick in a person's throat on the way down to the stomach, or when food takes longer to get to the stomach than usual. You may have problems swallowing food, liquids, or both. You may also have pain while trying to swallow. It may take you more time and effort to swallow something. What are the causes? This condition may be caused by: Muscle problems. These may make it difficult for you to move food and liquids through the esophagus, which is the tube that connects your mouth to your stomach. Blockages. You may have ulcers, scar tissue, or inflammation that blocks the normal passage of food and liquids. Causes of these problems include: Acid reflux from your stomach into your esophagus (gastroesophageal reflux). Infections. Radiation treatment for cancer. Medicines taken without enough fluids to wash them down into your stomach. Stroke. This can affect the nerves and make it difficult to swallow. Nerve problems. These prevent signals from being sent to the muscles of your esophagus to squeeze (contract) and move what you swallow down to your stomach. Globus pharyngeus. This is a common problem that involves a feeling like something is stuck in your throat or a sense of trouble with swallowing, even though nothing is wrong with the swallowing passages. Certain conditions, such as cerebral palsy or Parkinson's disease. What are the signs or symptoms? Common symptoms of this condition include: A feeling that solids or liquids are stuck in your throat on the way down to the stomach. Pain while swallowing. Coughing or gagging while trying to swallow. Other symptoms include: Food moving back from your stomach to your mouth (regurgitation). Noises coming from your throat. Chest discomfort when swallowing. A feeling of fullness when swallowing. Drooling, especially when the throat is blocked. Heartburn. How  is this diagnosed? This condition may be diagnosed by: Barium swallow X-ray. In this test, you will swallow a white liquid that sticks to the inside of your esophagus. X-ray images are then taken. Endoscopy. In this test, a flexible telescope is inserted down your throat to look at your esophagus and your stomach. CT scans or an MRI. How is this treated? Treatment for dysphagia depends on the cause of this condition: If the dysphagia is caused by acid reflux or infection, medicines may be used. These may include antibiotics or heartburn medicines. If the dysphagia is caused by problems with the muscles, swallowing therapy may be used to help you strengthen your swallowing muscles. You may have to do specific exercises to strengthen the muscles or stretch them. If the dysphagia is caused by a blockage or mass, procedures to remove the blockage may be done. You may need surgery and a feeding tube. You may need to make diet changes. Ask your health care provider for specific instructions. Follow these instructions at home: Medicines Take over-the-counter and prescription medicines only as told by your health care provider. If you were prescribed an antibiotic medicine, take it as told by your health care provider. Do not stop taking the antibiotic even if you start to feel better. Eating and drinking  Make any diet changes as told by your health care provider. Work with a diet and nutrition specialist (dietitian) to create an eating plan that will help you get the nutrients you need in order to stay healthy. Eat soft foods that are easier to swallow. Cut your food into small pieces and eat slowly. Take small bites. Eat and drink only when you   are sitting upright. Do not drink alcohol or caffeine. If you need help quitting, ask your health care provider. General instructions Check your weight every day to make sure you are not losing weight. Do not use any products that contain nicotine or  tobacco. These products include cigarettes, chewing tobacco, and vaping devices, such as e-cigarettes. If you need help quitting, ask your health care provider. Keep all follow-up visits. This is important. Contact a health care provider if: You lose weight because you cannot swallow. You cough when you drink liquids. You cough up partially digested food. Get help right away if: You cannot swallow your saliva. You have shortness of breath, a fever, or both. Your voice is hoarse and you have trouble swallowing. These symptoms may represent a serious problem that is an emergency. Do not wait to see if the symptoms will go away. Get medical help right away. Call your local emergency services (911 in the [place] Do not drive yourself to the hospital. Summary Dysphagia is trouble swallowing. This condition occurs when solids and liquids stick in a person's throat on the way down to the stomach. You may cough or gag while trying to swallow. Dysphagia has many possible causes. Treatment for dysphagia depends on the cause of the condition. Keep all follow-up visits. This is important. This information is not intended to replace advice given to you by your health care provider. Make sure you discuss any questions you have with your health care provider. Document Revised: 06/19/2020 Document Reviewed: 06/19/2020 Elsevier Patient Education  2023 [employer] Inc[employer]  

## 2022-07-13 NOTE — Progress Notes (Signed)
Subjective:    Patient ID: Carla Morales, female    DOB: [birth year], 61 y.o.   MRN: [id number]  Chief Complaint  Patient presents with   Medical Management of Chronic Issues   Urinary Urgency   Pt presents to the office today for CPE and chronic follow up. She is followed by GYN annually.  Asthma She complains of shortness of breath. There is no cough, frequent throat clearing or wheezing. This is a chronic problem. The current episode started more than 1 year ago. The problem has been waxing and waning. Associated symptoms include heartburn. Her symptoms are aggravated by pollen. Her symptoms are alleviated by rest. She reports moderate improvement on treatment. Her past medical history is significant for asthma.  Gastroesophageal Reflux She complains of belching, dysphagia, heartburn and nausea. She reports no coughing or no wheezing. This is a chronic problem. The current episode started more than 1 year ago. The problem occurs occasionally. Risk factors include obesity. She has tried a PPI for the symptoms. The treatment provided moderate relief.  Hyperlipidemia This is a chronic problem. The current episode started more than 1 year ago. The problem is controlled. Recent lipid tests were reviewed and are normal. Exacerbating diseases include obesity. Associated symptoms include shortness of breath. Current antihyperlipidemic treatment includes statins. The current treatment provides moderate improvement of lipids. Risk factors for coronary artery disease include dyslipidemia, hypertension, a sedentary lifestyle and post-menopausal.  Neck Pain  This is a new problem. The current episode started 1 to 4 weeks ago. The pain is associated with nothing. The pain is present in the anterior neck. The quality of the pain is described as aching. The pain is mild.  Urinary Frequency  This is a recurrent problem. The current episode started 1 to 4 weeks ago. The problem occurs intermittently. The  patient is experiencing no pain. Associated symptoms include frequency, nausea and urgency. Pertinent negatives include no hematuria or hesitancy. She has tried increased fluids for the symptoms. The treatment provided mild relief.      Review of Systems  Respiratory:  Positive for shortness of breath. Negative for cough and wheezing.   Gastrointestinal:  Positive for dysphagia, heartburn and nausea.  Genitourinary:  Positive for frequency and urgency. Negative for hematuria and hesitancy.  Musculoskeletal:  Positive for neck pain.  All other systems reviewed and are negative.  Family History  Problem Relation Age of Onset   Rheum arthritis Mother    Fibromyalgia Mother    Cancer Mother        breast, uterine, adenocarcinoma-right lung   Uterine cancer Mother        Sarcoma   Lung cancer Mother        Adenocarcinoma   Breast cancer Mother 22   Hypertension Father    Melanoma Father 51   Asthma Sister    Other Maternal Grandmother 18       childbirth   Heart disease Maternal Grandfather    Cancer Paternal Grandmother        unknown   Other Daughter        Phyllodes tumor   Diabetes Maternal Aunt    Cancer Maternal Aunt 77       GYN cancer - possibly vulvar?   Diabetes Maternal Uncle    Appendicitis Maternal Uncle        d. 12   Diabetes Paternal Aunt    Breast cancer Paternal Aunt 76   Breast cancer Paternal Aunt 30   Uterine  cancer Paternal Aunt 57   Breast cancer Paternal Aunt 58   Diabetes Paternal Uncle    Other Nephew        Gilbert's Disease   Colon cancer Neg Hx    Esophageal cancer Neg Hx    Ulcerative colitis Neg Hx    Stomach cancer Neg Hx    Pancreatic cancer Neg Hx    Social History   Socioeconomic History   Marital status: Married    Spouse name: Not on file   Number of children: 1   Years of education: Not on file   Highest education level: Not on file  Occupational History   Not on file  Tobacco Use   Smoking status: Never   Smokeless  tobacco: Never  Vaping Use   Vaping Use: Never used  Substance and Sexual Activity   Alcohol use: No    Alcohol/week: 0.0 standard drinks of alcohol   Drug use: No   Sexual activity: Yes    Partners: Male    Birth control/protection: Surgical    Comment: hyst.-1st intercourse 8 yo-1 partner  Other Topics Concern   Not on file  Social History Narrative   Not on file   Social Determinants of Health   Financial Resource Strain: Not on file  Food Insecurity: Not on file  Transportation Needs: Not on file  Physical Activity: Not on file  Stress: Not on file  Social Connections: Not on file       Objective:   Physical Exam Vitals reviewed.  Constitutional:      General: She is not in acute distress.    Appearance: She is well-developed.  HENT:     Head: Normocephalic and atraumatic.     Right Ear: Tympanic membrane normal.     Left Ear: Tympanic membrane normal.  Eyes:     Pupils: Pupils are equal, round, and reactive to light.  Neck:     Thyroid: No thyromegaly.  Cardiovascular:     Rate and Rhythm: Normal rate and regular rhythm.     Heart sounds: Normal heart sounds. No murmur heard. Pulmonary:     Effort: Pulmonary effort is normal. No respiratory distress.     Breath sounds: Normal breath sounds. No wheezing.  Abdominal:     General: Bowel sounds are normal. There is no distension.     Palpations: Abdomen is soft.     Tenderness: There is no abdominal tenderness.  Musculoskeletal:        General: No tenderness. Normal range of motion.     Cervical back: Normal range of motion and neck supple.  Skin:    General: Skin is warm and dry.  Neurological:     Mental Status: She is alert and oriented to person, place, and time.     Cranial Nerves: No cranial nerve deficit.     Deep Tendon Reflexes: Reflexes are normal and symmetric.  Psychiatric:        Behavior: Behavior normal.        Thought Content: Thought content normal.        Judgment: Judgment normal.        BP 104/62   Pulse 78   Temp 98.3 F (36.8 C) (Temporal)   Ht $R'5\' 5"'Pg$  (1.651 m)   Wt 183 lb 3.2 oz (83.1 kg)   LMP 07/24/2010   BMI 30.49 kg/m      Assessment & Plan:  Carla Morales comes in today with chief complaint of Medical Management of  Chronic Issues and Urinary Urgency   Diagnosis and orders addressed:  1. Urinary urgency - Urinalysis, Complete - Urine Culture - CMP14+EGFR - CBC with Differential/Platelet  2. Mild intermittent asthma without complication - QXI50+TUUE - CBC with Differential/Platelet  3. Gastroesophageal reflux disease, unspecified whether esophagitis present -Omeprazole increased to 40 mg from 20 mg  - omeprazole (PRILOSEC) 40 MG capsule; Take 1 capsule (40 mg total) by mouth daily.  Dispense: 30 capsule; Refill: 3 - CMP14+EGFR - CBC with Differential/Platelet  4. Obesity (BMI 30-39.9) - CMP14+EGFR - CBC with Differential/Platelet  5. Vitamin D deficiency - CMP14+EGFR - CBC with Differential/Platelet - VITAMIN D 25 Hydroxy (Vit-D Deficiency, Fractures)  6. Pure hypercholesterolemia - CMP14+EGFR - CBC with Differential/Platelet - Lipid panel  7. Annual physical exam  - CMP14+EGFR - CBC with Differential/Platelet - TSH - Lipid panel - VITAMIN D 25 Hydroxy (Vit-D Deficiency, Fractures)  8. Dysphagia, unspecified type  - Ambulatory referral to Gastroenterology  9. Colon cancer screening - Ambulatory referral to Gastroenterology   Labs pending Health Maintenance reviewed Diet and exercise encouraged  Follow up plan: 6 months    [name], FNP

## 2022-07-14 LAB — CMP14+EGFR
ALT: 40 IU/L — ABNORMAL HIGH (ref 0–32)
AST: 25 IU/L (ref 0–40)
Albumin/Globulin Ratio: 1.6 (ref 1.2–2.2)
Albumin: 4.6 g/dL (ref 3.8–4.9)
Alkaline Phosphatase: 169 IU/L — ABNORMAL HIGH (ref 44–121)
BUN/Creatinine Ratio: 27 — ABNORMAL HIGH (ref 9–23)
BUN: 24 mg/dL (ref 6–24)
Bilirubin Total: 0.4 mg/dL (ref 0.0–1.2)
CO2: 27 mmol/L (ref 20–29)
Calcium: 10.3 mg/dL — ABNORMAL HIGH (ref 8.7–10.2)
Chloride: 102 mmol/L (ref 96–106)
Creatinine, Ser: 0.88 mg/dL (ref 0.57–1.00)
Globulin, Total: 2.8 g/dL (ref 1.5–4.5)
Glucose: 99 mg/dL (ref 70–99)
Potassium: 4.2 mmol/L (ref 3.5–5.2)
Sodium: 144 mmol/L (ref 134–144)
Total Protein: 7.4 g/dL (ref 6.0–8.5)
eGFR: 76 mL/min/{1.73_m2} (ref >59)

## 2022-07-14 LAB — LIPID PANEL
Chol/HDL Ratio: 3.4 ratio (ref 0.0–4.4)
Cholesterol, Total: 172 mg/dL (ref 100–199)
HDL: 51 mg/dL (ref >39)
LDL Chol Calc (NIH): 90 mg/dL (ref 0–99)
Triglycerides: 182 mg/dL — ABNORMAL HIGH (ref 0–149)
VLDL Cholesterol Cal: 31 mg/dL (ref 5–40)

## 2022-07-14 LAB — CBC WITH DIFFERENTIAL/PLATELET
Basophils Absolute: 0.1 10*3/uL (ref 0.0–0.2)
Basos: 1 %
EOS (ABSOLUTE): 0.2 10*3/uL (ref 0.0–0.4)
Eos: 3 %
Hematocrit: 41.1 % (ref 34.0–46.6)
Hemoglobin: 13.7 g/dL (ref 11.1–15.9)
Immature Grans (Abs): 0 10*3/uL (ref 0.0–0.1)
Immature Granulocytes: 0 %
Lymphocytes Absolute: 2.4 10*3/uL (ref 0.7–3.1)
Lymphs: 32 %
MCH: 29.7 pg (ref 26.6–33.0)
MCHC: 33.3 g/dL (ref 31.5–35.7)
MCV: 89 fL (ref 79–97)
Monocytes Absolute: 0.5 10*3/uL (ref 0.1–0.9)
Monocytes: 7 %
Neutrophils Absolute: 4.3 10*3/uL (ref 1.4–7.0)
Neutrophils: 57 %
Platelets: 279 10*3/uL (ref 150–450)
RBC: 4.62 x10E6/uL (ref 3.77–5.28)
RDW: 13.3 % (ref 11.7–15.4)
WBC: 7.5 10*3/uL (ref 3.4–10.8)

## 2022-07-14 LAB — URINE CULTURE: Organism ID, Bacteria: NO GROWTH

## 2022-07-14 LAB — VITAMIN D 25 HYDROXY (VIT D DEFICIENCY, FRACTURES): Vit D, 25-Hydroxy: 46.8 ng/mL (ref 30.0–100.0)

## 2022-07-14 LAB — TSH: TSH: 2.05 u[IU]/mL (ref 0.450–4.500)

## 2022-09-01 NOTE — Progress Notes (Unsigned)
09/04/2022 Carla Morales [id number] 04-30-1961   ASSESSMENT AND PLAN:   Globus sensation with dysphagia, worse with liquids, has had previous thrush 2022 some odynophagia, worsening GERD EGD 2020 s/p dilatation that helped EGD with possible dilatation to evaluate for structural abnormality, tumor, erosive/infectious esophagitis.  With issues with liquids, will schedule for barium swallow to evaluate for dysmotility. Possible infectious, unremarkable pharynx examination.   Will increase PPI to twice daily, emphasizing before food. I discussed risks of EGD with patient today, including risk of sedation, bleeding or perforation.  Patient provides understanding and gave verbal consent to proceed.  History of adenomatous polyp of colon 10/06/2019 Colonoscopy showed diverticulosis, small internal hemorrhoids Patient had 6 tubular adenomas removed-recall 3 years or 10/05/2022. We have discussed the risks of bleeding, infection, perforation, medication reactions, and remote risk of death associated with colonoscopy. All questions were answered and the patient acknowledges these risk and wishes to proceed.  MASH Negative hepatocellular work up, likely [place] --Continue to work on risk factor modification including diet exercise and control of risk factors including blood sugars. - monitor q 6 months.    History of Present Illness:  61 y.o. female  with a past medical history of asthma, reflux, hyperlipidemia, and others listed below, known to Dr. Hilarie Fredrickson returns to clinic today for evaluation of LFTs.   Carla Morales 01/2020 for elevated liver function, patient had positive ANA but negative mitochondrial antibodies, anti-smooth muscle, hepatitis C,  02/05/2020 abdominal ultrasound showed 2.7 cm gallstone, fatty liver, patient had cholecystectomy in 2021.   01/25/2022 office visit with myself for elevated alk phos 200.   Recheck that visit down to 134. Fractionated alk phos  indeterminate. Hepatitis panel negative, IgG negative, iron and ferritin normal 01/29/2022 right upper quadrant ultrasound showed hepatic steatosis, biliary ducts unremarkable She is on lipitor x several years. She takes tylenol AS needed,no NSAIDS, did take dose of tylenol day before LFTs.  Denies ETOH.  Was on antibiotics in February.  Could be contributed to elevated labs.  She has hoarseness a lot, has had since COVID in 2020, feels something in throat all the time.  Had some GERD, increase prilosec from 20 to 40 but continues to have hoarseness, globulus sensation, intermittent dysphagia with liquid mainly.  States had thrush June 2022, states can still have painful swallowing, has had negative strep in the past.   10/06/2019 colonoscopy and endoscopy Colonoscopy showed diverticulosis, small internal hemorrhoids Patient had 6 tubular adenomas removed-recall 3 years or 10/05/2022. Endoscopy for dysphagia showed LA grade a nonobstructing mild Schatzki ring dilated to 18 mm.  Current Medications:    Current Outpatient Medications (Cardiovascular):    atorvastatin (LIPITOR) 10 MG tablet, Take 1 tablet (10 mg total) by mouth daily.   hydrochlorothiazide (HYDRODIURIL) 12.5 MG tablet, Take 1 tablet (12.5 mg total) by mouth daily.   nitroGLYCERIN (NITROSTAT) 0.4 MG SL tablet, Place 1 tablet (0.4 mg total) under the tongue every 5 (five) minutes as needed for chest pain.  Current Outpatient Medications (Respiratory):    albuterol (VENTOLIN HFA) 108 (90 Base) MCG/ACT inhaler, USE 2 INHALATIONS EVERY 6 HOURS AS NEEDED FOR WHEEZING OR SHORTNESS OF BREATH Need office visit for further refills   diphenhydrAMINE (BENADRYL) 12.5 MG/5ML liquid, Take by mouth at bedtime as needed.   fluticasone (FLONASE) 50 MCG/ACT nasal spray, USE 2 SPRAYS IN EACH NOSTRIL DAILY Needs office visit for further refills   levocetirizine (XYZAL) 5 MG tablet, Take 5 mg by mouth every evening.  Current Outpatient  Medications (Other):    cimetidine (TAGAMET) 400 MG tablet, Take 400 mg by mouth 3 (three) times daily.   Methylcellulose, Laxative, (CITRUCEL PO), Take by mouth.   multivitamin-iron-minerals-folic acid (CENTRUM) chewable tablet, Chew 1 tablet by mouth daily.   Probiotic Product (PROBIOTIC PEARLS WOMENS) CAPS, Take 1 capsule by mouth daily.   omeprazole (PRILOSEC) 40 MG capsule, Take 1 capsule (40 mg total) by mouth 2 (two) times daily before a meal.  Surgical History:  She  has a past surgical history that includes Tonsillectomy (1971); bladder dialation (1968); Tubal ligation (1998); Total vaginal hysterectomy (07/2010); Colonoscopy; and Abdominal hysterectomy. Family History:  Her family history includes Appendicitis in her maternal uncle; Asthma in her sister; Breast cancer (age of onset: 56) in her paternal aunt; Breast cancer (age of onset: 26) in her paternal aunt; Breast cancer (age of onset: 20) in her mother and paternal aunt; Cancer in her mother and paternal grandmother; Cancer (age of onset: 73) in her maternal aunt; Diabetes in her maternal aunt, maternal uncle, paternal aunt, and paternal uncle; Fibromyalgia in her mother; Heart disease in her maternal grandfather; Hypertension in her father; Lung cancer in her maternal aunt and mother; Melanoma (age of onset: 14) in her father; Other in her daughter and nephew; Other (age of onset: 33) in her maternal grandmother; Rheum arthritis in her mother; Uterine cancer in her mother; Uterine cancer (age of onset: 34) in her paternal aunt. Social History:   reports that she has never smoked. She has never used smokeless tobacco. She reports that she does not drink alcohol and does not use drugs.  Current Medications, Allergies, Past Medical History, Past Surgical History, Family History and Social History were reviewed in [employer] record.  Physical Exam: BP 124/70   Pulse 75   Ht $R'5\' 5"'lt$  (1.651 m)   Wt 188 lb 2 oz  (85.3 kg)   LMP 07/24/2010   BMI 31.31 kg/m  General:   Pleasant, well developed female in no acute distress Eyes: sclerae anicteric,conjunctive pink  Heart:  regular rate and rhythm Pulm: Clear anteriorly; no wheezing Abdomen:  Soft, Obese AB, skin exam normal, Normal bowel sounds. mild tenderness in the upper abdomen. Without guarding and Without rebound, without hepatomegaly. Extremities:  Without edema. Peripheral pulses intact.  Neurologic:  Alert and  oriented x4;  grossly normal neurologically. Skin:   Dry and intact without significant lesions or rashes. Psychiatric: Demonstrates good judgement and reason without abnormal affect or behaviors.  Vladimir Crofts, PA-C 09/04/22

## 2022-09-04 ENCOUNTER — Ambulatory Visit: Payer: BC Managed Care – PPO | Admitting: Physician Assistant

## 2022-09-04 ENCOUNTER — Encounter: Payer: Self-pay | Admitting: Physician Assistant

## 2022-09-04 VITALS — BP 124/70 | HR 75 | Ht 65.0 in | Wt 188.1 lb

## 2022-09-04 DIAGNOSIS — Z86010 Personal history of colonic polyps: Secondary | ICD-10-CM

## 2022-09-04 DIAGNOSIS — R1319 Other dysphagia: Secondary | ICD-10-CM

## 2022-09-04 DIAGNOSIS — K7581 Nonalcoholic steatohepatitis (NASH): Secondary | ICD-10-CM

## 2022-09-04 DIAGNOSIS — R09A2 Foreign body sensation, throat: Secondary | ICD-10-CM | POA: Diagnosis not present

## 2022-09-04 DIAGNOSIS — K219 Gastro-esophageal reflux disease without esophagitis: Secondary | ICD-10-CM | POA: Diagnosis not present

## 2022-09-04 MED ORDER — OMEPRAZOLE 40 MG PO CPDR: 40.0000 mg | DELAYED_RELEASE_CAPSULE | Freq: Two times a day (BID) | ORAL | 3 refills | Status: DC

## 2022-09-04 MED ORDER — NA SULFATE-K SULFATE-MG SULF 17.5-3.13-1.6 GM/177ML PO SOLN: 1.0000 | Freq: Once | ORAL | 0 refills | Status: AC

## 2022-09-04 NOTE — Patient Instructions (Addendum)
If you are age 59 or older, your body mass index should be between 23-30. Your Body mass index is 31.31 kg/m. If this is out of the aforementioned range listed, please consider follow up with your Primary Care Provider.  If you are age 61 or younger, your body mass index should be between 19-25. Your Body mass index is 31.31 kg/m. If this is out of the aformentioned range listed, please consider follow up with your Primary Care Provider.   ________________________________________________________  Carla Morales have been scheduled for an endoscopy and colonoscopy. Please follow the written instructions given to you at your visit today. Please pick up your prep supplies at the pharmacy within the next 1-3 days. If you use inhalers (even only as needed), please bring them with you on the day of your procedure.  You have been scheduled for a Barium Esophogram at [facility] (1st floor of the hospital) on Thursday, 09-14-22 at 9:00am. Please arrive 15 minutes prior to your appointment for registration. Make certain not to have anything to eat or drink 3 hours prior to your test. If you need to reschedule for any reason, please contact radiology at [phone] to do so. __________________________________________________________________ A barium swallow is an examination that concentrates on views of the esophagus. This tends to be a double contrast exam (barium and two liquids which, when combined, create a gas to distend the wall of the oesophagus) or single contrast (non-ionic iodine based). The study is usually tailored to your symptoms so a good history is essential. Attention is paid during the study to the form, structure and configuration of the esophagus, looking for functional disorders (such as aspiration, dysphagia, achalasia, motility and reflux) EXAMINATION You may be asked to change into a gown, depending on the type of swallow being performed. A radiologist and radiographer will perform the  procedure. The radiologist will advise you of the type of contrast selected for your procedure and direct you during the exam. You will be asked to stand, sit or lie in several different positions and to hold a small amount of fluid in your mouth before being asked to swallow while the imaging is performed .In some instances you may be asked to swallow barium coated marshmallows to assess the motility of a solid food bolus. The exam can be recorded as a digital or video fluoroscopy procedure. POST PROCEDURE It will take 1-2 days for the barium to pass through your system. To facilitate this, it is important, unless otherwise directed, to increase your fluids for the next 24-48hrs and to resume your normal diet.  This test typically takes about 30 minutes to perform. __________________________________________________________________  Please take your proton pump inhibitor medication, prilosec 40 mg twice a day  Please take this medication 30 minutes to 1 hour before meals- this makes it more effective.  Avoid spicy and acidic foods Avoid fatty foods Limit your intake of coffee, tea, alcohol, and carbonated drinks Work to maintain a healthy weight Keep the head of the bed elevated at least 3 inches with blocks or a wedge pillow if you are having any nighttime symptoms Stay upright for 2 hours after eating Avoid meals and snacks three to four hours before bedtime    Metabolic dysfunction associated seatohepatitis  Now the leading cause of liver failure in the [place].  It is normally from such risk factors as obesity, diabetes, insulin resistance, high cholesterol, or metabolic syndrome.  The only definitive therapy is weight loss and exercise.  Can add  on vitamin E '800mg'$  once daily Suggest walking 20-30 mins daily.  Decreasing carbohydrates, increasing veggies.  Check liver function twice a year   Fatty Liver Fatty liver is the accumulation of fat in liver cells. It is also called  hepatosteatosis or steatohepatitis. It is normal for your liver to contain some fat. If fat is more than 5 to 10% of your liver's weight, you have fatty liver.  There are often no symptoms (problems) for years while damage is still occurring. People often learn about their fatty liver when they have medical tests for other reasons. Fat can damage your liver for years or even decades without causing problems. When it becomes severe, it can cause fatigue, weight loss, weakness, and confusion. This makes you more likely to develop more serious liver problems. The liver is the largest organ in the body. It does a lot of work and often gives no warning signs when it is sick until late in a disease. The liver has many important jobs including: Breaking down foods. Storing vitamins, iron, and other minerals. Making proteins. Making bile for food digestion. Breaking down many products including medications, alcohol and some poisons.  PROGNOSIS  Fatty liver may cause no damage or it can lead to an inflammation of the liver. This is, called steatohepatitis.  Over time the liver may become scarred and hardened. This condition is called cirrhosis. Cirrhosis is serious and may lead to liver failure or cancer. NASH is one of the leading causes of cirrhosis. About 10-20% of Americans have fatty liver and a smaller 2-5% has NASH.  TREATMENT  Weight loss, fat restriction, and exercise in overweight patients produces inconsistent results but is worth trying. Good control of diabetes may reduce fatty liver. Eat a balanced, healthy diet. Increase your physical activity. There are no medical or surgical treatments for a fatty liver or NASH, but improving your diet and increasing your exercise may help prevent or reverse some of the damage.

## 2022-09-09 NOTE — Progress Notes (Signed)
Addendum: Reviewed and agree with assessment and management plan. Sila Sarsfield M, MD  

## 2022-09-14 ENCOUNTER — Ambulatory Visit (HOSPITAL_COMMUNITY)
Admission: RE | Admit: 2022-09-14 | Discharge: 2022-09-14 | Disposition: A | Discharge status: Home / Self Care | Payer: BC Managed Care – PPO | Source: Ambulatory Visit | Attending: Physician Assistant | Admitting: Physician Assistant

## 2022-09-14 DIAGNOSIS — K224 Dyskinesia of esophagus: Secondary | ICD-10-CM | POA: Diagnosis not present

## 2022-09-14 DIAGNOSIS — R09A2 Foreign body sensation, throat: Secondary | ICD-10-CM | POA: Diagnosis not present

## 2022-09-14 DIAGNOSIS — R1319 Other dysphagia: Secondary | ICD-10-CM | POA: Insufficient documentation

## 2022-09-14 DIAGNOSIS — R1310 Dysphagia, unspecified: Secondary | ICD-10-CM | POA: Diagnosis not present

## 2022-09-28 ENCOUNTER — Other Ambulatory Visit: Payer: Self-pay | Admitting: Family

## 2022-09-28 DIAGNOSIS — R600 Localized edema: Secondary | ICD-10-CM

## 2022-09-28 DIAGNOSIS — E7800 Pure hypercholesterolemia, unspecified: Secondary | ICD-10-CM

## 2022-09-28 DIAGNOSIS — R609 Edema, unspecified: Secondary | ICD-10-CM

## 2022-10-14 ENCOUNTER — Encounter: Payer: Self-pay | Admitting: Certified Registered Nurse Anesthetist

## 2022-10-18 ENCOUNTER — Encounter: Payer: Self-pay | Admitting: Internal Medicine

## 2022-10-18 ENCOUNTER — Ambulatory Visit (AMBULATORY_SURGERY_CENTER): Payer: BC Managed Care – PPO | Admitting: Internal Medicine

## 2022-10-18 VITALS — BP 119/75 | HR 79 | Temp 97.3°F | Resp 17 | Ht 65.0 in | Wt 188.0 lb

## 2022-10-18 DIAGNOSIS — R09A2 Foreign body sensation, throat: Secondary | ICD-10-CM

## 2022-10-18 DIAGNOSIS — Z09 Encounter for follow-up examination after completed treatment for conditions other than malignant neoplasm: Secondary | ICD-10-CM

## 2022-10-18 DIAGNOSIS — Z86010 Personal history of colonic polyps: Secondary | ICD-10-CM | POA: Diagnosis not present

## 2022-10-18 DIAGNOSIS — R1319 Other dysphagia: Secondary | ICD-10-CM | POA: Diagnosis not present

## 2022-10-18 DIAGNOSIS — Z1211 Encounter for screening for malignant neoplasm of colon: Secondary | ICD-10-CM | POA: Diagnosis not present

## 2022-10-18 DIAGNOSIS — K222 Esophageal obstruction: Secondary | ICD-10-CM | POA: Diagnosis not present

## 2022-10-18 DIAGNOSIS — K2970 Gastritis, unspecified, without bleeding: Secondary | ICD-10-CM

## 2022-10-18 DIAGNOSIS — D122 Benign neoplasm of ascending colon: Secondary | ICD-10-CM | POA: Diagnosis not present

## 2022-10-18 DIAGNOSIS — D123 Benign neoplasm of transverse colon: Secondary | ICD-10-CM | POA: Diagnosis not present

## 2022-10-18 DIAGNOSIS — K219 Gastro-esophageal reflux disease without esophagitis: Secondary | ICD-10-CM

## 2022-10-18 MED ORDER — SODIUM CHLORIDE 0.9 % IV SOLN: 500.0000 mL | INTRAVENOUS | Status: DC

## 2022-10-18 NOTE — Op Note (Signed)
[facility] Patient Name: Faatima Tench Procedure Date: 10/18/2022 2:10 PM MRN: [id number] Endoscopist: Jerene Bears , MD, [id number] Age: 61 Referring MD:  Date of Birth: 28-Dec-1960 Gender: Female Account #: [id number] Procedure:                Upper GI endoscopy Indications:              Gastro-esophageal reflux disease, Globus sensation,                            sore throat, hoarseness of voice, only minimal                            improvement with BID PPI Medicines:                Monitored Anesthesia Care Procedure:                Pre-Anesthesia Assessment:                           - Prior to the procedure, a History and Physical                            was performed, and patient medications and                            allergies were reviewed. The patient's tolerance of                            previous anesthesia was also reviewed. The risks                            and benefits of the procedure and the sedation                            options and risks were discussed with the patient.                            All questions were answered, and informed consent                            was obtained. Prior Anticoagulants: The patient has                            taken no anticoagulant or antiplatelet agents. ASA                            Grade Assessment: II - A patient with mild systemic                            disease. After reviewing the risks and benefits,                            the patient was deemed in satisfactory condition to  undergo the procedure.                           After obtaining informed consent, the endoscope was                            passed under direct vision. Throughout the                            procedure, the patient's blood pressure, pulse, and                            oxygen saturations were monitored continuously. The                            Endoscope was introduced  through the mouth, and                            advanced to the second part of duodenum. The upper                            GI endoscopy was accomplished without difficulty.                            The patient tolerated the procedure well. Scope In: Scope Out: Findings:                 Diffuse mild inflammation characterized by                            congestion (edema) and granularity was found in the                            middle third of the esophagus and in the lower                            third of the esophagus. Biopsies were obtained from                            the proximal and distal esophagus with cold forceps                            for histology of suspected eosinophilic esophagitis.                           One benign-appearing, intrinsic mild                            (non-circumferential scarring) stenosis was found                            40 cm from the incisors. This stenosis measured 1.4                            cm (inner  diameter) x less than one cm (in length).                            The stenosis was traversed. A TTS dilator was                            passed through the scope. Dilation with an 18-19-20                            mm balloon dilator was performed to 19 mm.                           The entire examined stomach was normal.                           The examined duodenum was normal. Complications:            No immediate complications. Estimated Blood Loss:     Estimated blood loss was minimal. Impression:               - Esophageal mucosal changes were present,                            including congestion (edema) and granularity.                            Findings are suggestive of reflux inflammation.                           - Benign-appearing esophageal stenosis. Dilated to                            19 mm with balloon.                           - Normal stomach.                           - Normal examined  duodenum.                           - Biopsies were taken with a cold forceps for                            evaluation and to exclude eosinophilic esophagitis. Recommendation:           - Patient has a contact number available for                            emergencies. The signs and symptoms of potential                            delayed complications were discussed with the                            patient. Return to normal activities tomorrow.  Written discharge instructions were provided to the                            patient.                           - Resume previous diet.                           - Continue present medications.                           - Await pathology results.                           - See the other procedure note for documentation of                            additional recommendations. Jerene Bears, MD 10/18/2022 2:46:46 PM This report has been signed electronically.

## 2022-10-18 NOTE — Progress Notes (Signed)
Report given to PACU, vss 

## 2022-10-18 NOTE — Progress Notes (Signed)
Patient states no changes in health or medications

## 2022-10-18 NOTE — Patient Instructions (Signed)
Read all handouts provided to you today  Await pathology results   YOU HAD AN ENDOSCOPIC PROCEDURE TODAY AT [place]:   Refer to the procedure report that was given to you for any specific questions about what was found during the examination.  If the procedure report does not answer your questions, please call your gastroenterologist to clarify.  If you requested that your care partner not be given the details of your procedure findings, then the procedure report has been included in a sealed envelope for you to review at your convenience later.  YOU SHOULD EXPECT: Some feelings of bloating in the abdomen. Passage of more gas than usual.  Walking can help get rid of the air that was put into your GI tract during the procedure and reduce the bloating. If you had a lower endoscopy (such as a colonoscopy or flexible sigmoidoscopy) you may notice spotting of blood in your stool or on the toilet paper. If you underwent a bowel prep for your procedure, you may not have a normal bowel movement for a few days.  Please Note:  You might notice some irritation and congestion in your nose or some drainage.  This is from the oxygen used during your procedure.  There is no need for concern and it should clear up in a day or so.  SYMPTOMS TO REPORT IMMEDIATELY:  Following lower endoscopy (colonoscopy or flexible sigmoidoscopy):  Excessive amounts of blood in the stool  Significant tenderness or worsening of abdominal pains  Swelling of the abdomen that is new, acute  Fever of 100F or higher  Following upper endoscopy (EGD)  Vomiting of blood or coffee ground material  New chest pain or pain under the shoulder blades  Painful or persistently difficult swallowing  New shortness of breath  Fever of 100F or higher  Black, tarry-looking stools  For urgent or emergent issues, a gastroenterologist can be reached at any hour by calling [phone]. Do not use MyChart messaging for urgent  concerns.    DIET:  We do recommend a small meal at first, but then you may proceed to your regular diet.  Drink plenty of fluids but you should avoid alcoholic beverages for 24 hours.  ACTIVITY:  You should plan to take it easy for the rest of today and you should NOT DRIVE or use heavy machinery until tomorrow (because of the sedation medicines used during the test).    FOLLOW UP: Our staff will call the number listed on your records the next business day following your procedure.  We will call around 7:15- 8:00 am to check on you and address any questions or concerns that you may have regarding the information given to you following your procedure. If we do not reach you, we will leave a message.     If any biopsies were taken you will be contacted by phone or by letter within the next 1-3 weeks.  Please call us at [phone] if you have not heard about the biopsies in 3 weeks.    SIGNATURES/CONFIDENTIALITY: You and/or your care partner have signed paperwork which will be entered into your electronic medical record.  These signatures attest to the fact that that the information above on your After Visit Summary has been reviewed and is understood.  Full responsibility of the confidentiality of this discharge information lies with you and/or your care-partner.

## 2022-10-18 NOTE — Progress Notes (Signed)
1324 Robinul 0.1 mg IV given due large amount of secretions upon assessment.  MD made aware, vss

## 2022-10-18 NOTE — Progress Notes (Signed)
Called to room to assist during endoscopic procedure.  Patient ID and intended procedure confirmed with present staff. Received instructions for my participation in the procedure from the performing physician.  

## 2022-10-18 NOTE — Progress Notes (Signed)
GASTROENTEROLOGY PROCEDURE H&P NOTE   Primary Care Physician: Sharion Balloon, FNP    Reason for Procedure:  Dysphagia, globus sensation, history of colon polyps  Plan:    EGD and colonoscopy  Patient is appropriate for endoscopic procedure(s) in the ambulatory ([place]) setting.  The nature of the procedure, as well as the risks, benefits, and alternatives were carefully and thoroughly reviewed with the patient. Ample time for discussion and questions allowed. The patient understood, was satisfied, and agreed to proceed.     HPI: Carla Morales is a 61 y.o. female who presents for EGD and colonoscopy.  Medical history as below.  Tolerated the prep.  No recent chest pain or shortness of breath.  No abdominal pain today.  Past Medical History:  Diagnosis Date   Allergy    Arthritis    Asthma    COVID-19    Diverticulosis    Elevated alkaline phosphatase level    Elevated antinuclear antibody (ANA) level    GERD (gastroesophageal reflux disease)    Hyperlipidemia    Internal hemorrhoids    Migraine    Pinched nerve in neck    Schatzki's ring    Sinus congestion    Urinary incontinence     Past Surgical History:  Procedure Laterality Date   ABDOMINAL HYSTERECTOMY     bladder dialation  1968   as a child   COLONOSCOPY     TONSILLECTOMY  1971   TOTAL VAGINAL HYSTERECTOMY  07/2010   menorrhagia, leiomyoma, prolapse   TUBAL LIGATION  1998    Prior to Admission medications   Medication Sig Start Date End Date Taking? Authorizing Provider  atorvastatin (LIPITOR) 10 MG tablet TAKE 1 TABLET DAILY 09/28/22  Yes Hawks, Christy A, FNP  fluticasone (FLONASE) 50 MCG/ACT nasal spray USE 2 SPRAYS IN EACH NOSTRIL DAILY Needs office visit for further refills 03/13/22  Yes Hawks, Christy A, FNP  hydrochlorothiazide (HYDRODIURIL) 12.5 MG tablet TAKE 1 TABLET DAILY 09/28/22  Yes Hawks, Christy A, FNP  levocetirizine (XYZAL) 5 MG tablet Take 5 mg by mouth every evening.   Yes  [provider]  Methylcellulose, Laxative, (CITRUCEL PO) Take by mouth.   Yes [provider]  multivitamin-iron-minerals-folic acid (CENTRUM) chewable tablet Chew 1 tablet by mouth daily.   Yes [provider]  omeprazole (PRILOSEC) 40 MG capsule Take 1 capsule (40 mg total) by mouth 2 (two) times daily before a meal. 09/04/22  Yes Vladimir Crofts, PA-C  Probiotic Product (PROBIOTIC PEARLS WOMENS) CAPS Take 1 capsule by mouth daily.   Yes [provider]  albuterol (VENTOLIN HFA) 108 (90 Base) MCG/ACT inhaler USE 2 INHALATIONS EVERY 6 HOURS AS NEEDED FOR WHEEZING OR SHORTNESS OF BREATH Need office visit for further refills 03/13/22   [name] A, FNP  cimetidine (TAGAMET) 400 MG tablet Take 400 mg by mouth 3 (three) times daily. Patient not taking: Reported on 10/18/2022 05/03/22   [provider]  diphenhydrAMINE (BENADRYL) 12.5 MG/5ML liquid Take by mouth at bedtime as needed.    [provider]  nitroGLYCERIN (NITROSTAT) 0.4 MG SL tablet Place 1 tablet (0.4 mg total) under the tongue every 5 (five) minutes as needed for chest pain. Patient not taking: Reported on 10/18/2022 02/15/21   Ivy Lynn, NP    Current Outpatient Medications  Medication Sig Dispense Refill   atorvastatin (LIPITOR) 10 MG tablet TAKE 1 TABLET DAILY 90 tablet 0   fluticasone (FLONASE) 50 MCG/ACT nasal spray USE 2  SPRAYS IN EACH NOSTRIL DAILY Needs office visit for further refills 48 g 0   hydrochlorothiazide (HYDRODIURIL) 12.5 MG tablet TAKE 1 TABLET DAILY 90 tablet 0   levocetirizine (XYZAL) 5 MG tablet Take 5 mg by mouth every evening.     Methylcellulose, Laxative, (CITRUCEL PO) Take by mouth.     multivitamin-iron-minerals-folic acid (CENTRUM) chewable tablet Chew 1 tablet by mouth daily.     omeprazole (PRILOSEC) 40 MG capsule Take 1 capsule (40 mg total) by mouth 2 (two) times daily before a meal. 60 capsule 3   Probiotic Product (PROBIOTIC PEARLS  WOMENS) CAPS Take 1 capsule by mouth daily.     albuterol (VENTOLIN HFA) 108 (90 Base) MCG/ACT inhaler USE 2 INHALATIONS EVERY 6 HOURS AS NEEDED FOR WHEEZING OR SHORTNESS OF BREATH Need office visit for further refills 54 g 0   cimetidine (TAGAMET) 400 MG tablet Take 400 mg by mouth 3 (three) times daily. (Patient not taking: Reported on 10/18/2022)     diphenhydrAMINE (BENADRYL) 12.5 MG/5ML liquid Take by mouth at bedtime as needed.     nitroGLYCERIN (NITROSTAT) 0.4 MG SL tablet Place 1 tablet (0.4 mg total) under the tongue every 5 (five) minutes as needed for chest pain. (Patient not taking: Reported on 10/18/2022) 50 tablet 3   Current Facility-Administered Medications  Medication Dose Route Frequency Provider Last Rate Last Admin   0.9 %  sodium chloride infusion  500 mL Intravenous Continuous Serah Nicoletti, Lajuan Lines, MD        Allergies as of 10/18/2022 - Review Complete 10/18/2022  Allergen Reaction Noted   Other Swelling 10/16/2013   Contrast media [iodinated contrast media]  09/18/2019   Haldol [haloperidol decanoate]  05/19/2011   Pravastatin Other (See Comments) 08/15/2013   Sulfa antibiotics Rash 05/19/2011    Family History  Problem Relation Age of Onset   Rheum arthritis Mother    Fibromyalgia Mother    Cancer Mother        breast, uterine, adenocarcinoma-right lung   Uterine cancer Mother        Sarcoma   Lung cancer Mother        Adenocarcinoma   Breast cancer Mother 31   Hypertension Father    Melanoma Father 33   Asthma Sister    Other Maternal Grandmother 64       childbirth   Heart disease Maternal Grandfather    Cancer Paternal Grandmother        unknown   Other Daughter        Phyllodes tumor   Diabetes Maternal Aunt    Cancer Maternal Aunt 44       GYN cancer - possibly vulvar?   Lung cancer Maternal Aunt    Diabetes Maternal Uncle    Appendicitis Maternal Uncle        d. 12   Diabetes Paternal Aunt    Breast cancer Paternal Aunt 94   Breast cancer  Paternal Aunt 43   Uterine cancer Paternal Aunt 78   Breast cancer Paternal Aunt 95   Diabetes Paternal Uncle    Other Nephew        Gilbert's Disease   Colon cancer Neg Hx    Esophageal cancer Neg Hx    Ulcerative colitis Neg Hx    Stomach cancer Neg Hx    Pancreatic cancer Neg Hx     Social History   Socioeconomic History   Marital status: Married    Spouse name: Not on file   Number of  children: 1   Years of education: Not on file   Highest education level: Not on file  Occupational History   Not on file  Tobacco Use   Smoking status: Never   Smokeless tobacco: Never  Vaping Use   Vaping Use: Never used  Substance and Sexual Activity   Alcohol use: No    Alcohol/week: 0.0 standard drinks of alcohol   Drug use: No   Sexual activity: Yes    Partners: Male    Birth control/protection: Surgical    Comment: hyst.-1st intercourse 26 yo-1 partner  Other Topics Concern   Not on file  Social History Narrative   Not on file   Social Determinants of Health   Financial Resource Strain: Not on file  Food Insecurity: Not on file  Transportation Needs: Not on file  Physical Activity: Not on file  Stress: Not on file  Social Connections: Not on file  Intimate Partner Violence: Not on file    Physical Exam: Vital signs in last 24 hours: '@BP'$  128/61   Pulse 80   Temp (!) 97.3 F (36.3 C)   Ht '5\' 5"'$  (1.651 m)   Wt 188 lb (85.3 kg)   LMP 07/24/2010   SpO2 96%   BMI 31.28 kg/m  GEN: NAD EYE: Sclerae anicteric ENT: MMM CV: Non-tachycardic Pulm: CTA b/l GI: Soft, NT/ND NEURO:  Alert & Oriented x 3   Zenovia Jarred, MD [facility]  10/18/2022 1:58 PM

## 2022-10-18 NOTE — Progress Notes (Signed)
1415 HR > 100 with esmolol 25 mg given IV, MD updated, vss

## 2022-10-18 NOTE — Progress Notes (Signed)
1425 HR > 100 with esmolol 25 mg given IV, MD updated, vss

## 2022-10-18 NOTE — Op Note (Signed)
[facility] Patient Name: Carla Morales Procedure Date: 10/18/2022 2:01 PM MRN: [id number] Endoscopist: Jerene Bears , MD, [id number] Age: 61 Referring MD:  Date of Birth: 1961/08/23 Gender: Female Account #: [id number] Procedure:                Colonoscopy Indications:              Surveillance: Personal history of adenomatous                            polyps on last colonoscopy 3 years ago (6 adenomas                            removed), Last colonoscopy: November 2020 Medicines:                Monitored Anesthesia Care Procedure:                Pre-Anesthesia Assessment:                           - Prior to the procedure, a History and Physical                            was performed, and patient medications and                            allergies were reviewed. The patient's tolerance of                            previous anesthesia was also reviewed. The risks                            and benefits of the procedure and the sedation                            options and risks were discussed with the patient.                            All questions were answered, and informed consent                            was obtained. Prior Anticoagulants: The patient has                            taken no anticoagulant or antiplatelet agents. ASA                            Grade Assessment: II - A patient with mild systemic                            disease. After reviewing the risks and benefits,                            the patient was deemed in satisfactory condition to  undergo the procedure.                           After obtaining informed consent, the colonoscope                            was passed under direct vision. Throughout the                            procedure, the patient's blood pressure, pulse, and                            oxygen saturations were monitored continuously. The                            [id number]-HQ190L  Colonoscope was introduced through the                            anus and advanced to the cecum, identified by                            appendiceal orifice and ileocecal valve. The                            colonoscopy was performed without difficulty. The                            patient tolerated the procedure well. The quality                            of the bowel preparation was good. The ileocecal                            valve, appendiceal orifice, and rectum were                            photographed. Scope In: 2:28:01 PM Scope Out: 2:42:01 PM Scope Withdrawal Time: 0 hours 9 minutes 23 seconds  Total Procedure Duration: 0 hours 14 minutes 0 seconds  Findings:                 The digital rectal exam was normal.                           A 4 mm polyp was found in the ascending colon. The                            polyp was sessile. The polyp was removed with a                            cold snare. Resection and retrieval were complete.                           Two sessile polyps were found in the transverse  colon. The polyps were 5 to 6 mm in size. These                            polyps were removed with a cold snare. Resection                            and retrieval were complete.                           Multiple large-mouthed and small-mouthed                            diverticula were found in the sigmoid colon,                            descending colon and ascending colon.                           The retroflexed view of the distal rectum and anal                            verge was normal and showed no anal or rectal                            abnormalities. Complications:            No immediate complications. Estimated Blood Loss:     Estimated blood loss was minimal. Impression:               - One 4 mm polyp in the ascending colon, removed                            with a cold snare. Resected and retrieved.                            - Two 5 to 6 mm polyps in the transverse colon,                            removed with a cold snare. Resected and retrieved.                           - Mild diverticulosis in the sigmoid colon, in the                            descending colon and in the ascending colon.                           - The distal rectum and anal verge are normal on                            retroflexion view. Recommendation:           - Patient has a contact number available for  emergencies. The signs and symptoms of potential                            delayed complications were discussed with the                            patient. Return to normal activities tomorrow.                            Written discharge instructions were provided to the                            patient.                           - Resume previous diet.                           - Continue present medications.                           - Await pathology results.                           - Repeat colonoscopy is recommended for                            surveillance. The colonoscopy date will be                            determined after pathology results from today's                            exam become available for review. Jerene Bears, MD 10/18/2022 2:49:20 PM This report has been signed electronically.

## 2022-10-19 ENCOUNTER — Telehealth: Payer: Self-pay

## 2022-10-19 NOTE — Telephone Encounter (Signed)
Left message on follow up call. 

## 2022-10-25 ENCOUNTER — Other Ambulatory Visit: Payer: Self-pay

## 2022-10-25 MED ORDER — DEXLANSOPRAZOLE 60 MG PO CPDR: 60.0000 mg | DELAYED_RELEASE_CAPSULE | Freq: Every day | ORAL | 3 refills | Status: DC

## 2022-12-04 ENCOUNTER — Other Ambulatory Visit: Payer: Self-pay | Admitting: Family

## 2022-12-04 DIAGNOSIS — Z1231 Encounter for screening mammogram for malignant neoplasm of breast: Secondary | ICD-10-CM

## 2022-12-11 ENCOUNTER — Encounter: Payer: Self-pay | Admitting: *Deleted

## 2022-12-16 DIAGNOSIS — H2513 Age-related nuclear cataract, bilateral: Secondary | ICD-10-CM | POA: Diagnosis not present

## 2022-12-16 DIAGNOSIS — H40033 Anatomical narrow angle, bilateral: Secondary | ICD-10-CM | POA: Diagnosis not present

## 2023-01-04 ENCOUNTER — Other Ambulatory Visit: Payer: Self-pay | Admitting: Family

## 2023-01-04 DIAGNOSIS — R609 Edema, unspecified: Secondary | ICD-10-CM

## 2023-01-04 DIAGNOSIS — E7800 Pure hypercholesterolemia, unspecified: Secondary | ICD-10-CM

## 2023-01-04 NOTE — Telephone Encounter (Signed)
Appt scheduled 01/23/23

## 2023-01-04 NOTE — Telephone Encounter (Signed)
Hawks NTBS for 6 mos FU which should have been in Feb. RF SENT to mail order

## 2023-01-09 ENCOUNTER — Encounter: Payer: Self-pay | Admitting: Internal Medicine

## 2023-01-09 ENCOUNTER — Ambulatory Visit: Payer: BC Managed Care – PPO | Admitting: Internal Medicine

## 2023-01-09 VITALS — BP 102/72 | HR 72 | Ht 65.0 in | Wt 192.1 lb

## 2023-01-09 DIAGNOSIS — K2100 Gastro-esophageal reflux disease with esophagitis, without bleeding: Secondary | ICD-10-CM

## 2023-01-09 DIAGNOSIS — K760 Fatty (change of) liver, not elsewhere classified: Secondary | ICD-10-CM

## 2023-01-09 DIAGNOSIS — R198 Other specified symptoms and signs involving the digestive system and abdomen: Secondary | ICD-10-CM

## 2023-01-09 DIAGNOSIS — R140 Abdominal distension (gaseous): Secondary | ICD-10-CM | POA: Diagnosis not present

## 2023-01-09 DIAGNOSIS — K200 Eosinophilic esophagitis: Secondary | ICD-10-CM

## 2023-01-09 MED ORDER — DEXLANSOPRAZOLE 60 MG PO CPDR: 60.0000 mg | DELAYED_RELEASE_CAPSULE | Freq: Every day | ORAL | 3 refills | Status: DC

## 2023-01-09 MED ORDER — AMBULATORY NON FORMULARY MEDICATION: 1 refills | Status: DC

## 2023-01-09 NOTE — Patient Instructions (Addendum)
We have sent the following medications to your pharmacy for you to pick up at your convenience: Myrtie Hawk -2 mg( take 1 pill by mouth twice daily for 8 weeks.) If this is not covered, please let our office know.   Continue Dexilant 60 mg once daily.   A high fiber diet with plenty of fluids (up to 8 glasses of water daily) is suggested to relieve these symptoms. Citrucel 1 tablespoon once or twice daily can be used to keep bowels regular if needed.  Start Vitamin E 800 IU daily.   Follow up on : 04/17/23 at 9:10 am   _______________________________________________________  If your blood pressure at your visit was 140/90 or greater, please contact your primary care physician to follow up on this.  _______________________________________________________  If you are age 67 or older, your body mass index should be between 23-30. Your Body mass index is 31.97 kg/m. If this is out of the aforementioned range listed, please consider follow up with your Primary Care Provider.  If you are age 22 or younger, your body mass index should be between 19-25. Your Body mass index is 31.97 kg/m. If this is out of the aformentioned range listed, please consider follow up with your Primary Care Provider.   ________________________________________________________  The [facility] providers would like to encourage you to use [facility] to communicate with providers for non-urgent requests or questions.  Due to long hold times on the telephone, sending your provider a message by [facility] may be a faster and more efficient way to get a response.  Please allow 48 business hours for a response.  Please remember that this is for non-urgent requests.  _______________________________________________________  Thank you for choosing me and [facility].  Dr.Jay Pyrtle

## 2023-01-09 NOTE — Progress Notes (Signed)
Subjective:    Patient ID: Carla Morales, female    DOB: [birth year], 62 y.o.   MRN: [id number]  HPI Carla Morales is a 62 year old female with a history of GERD and esophagitis, MASH, history of adenomatous colon polyps, hyperlipidemia, obesity, who is here for follow-up.  She is here alone today and I last saw her for her upper and lower endoscopy performed in December 2023.  EGD 10/18/2022: Inflammatory changes in the middle and lower third of the esophagus raising question of EOE.  Biopsies obtained.  Mild stenosis at GE junction dilated to 19 mm with balloon.  Normal stomach and duodenum.  Pathology in the distal esophagus normal.  Proximal esophagus focal intraepithelial eosinophils up to 11 per high-power field Colonoscopy 10/18/2022 3 polyps removed all less than a centimeter.  Diverticulosis in the ascending, descending and sigmoid colon.  Pathology adenomatous polyps  She has been taking Dexilant 60 mg daily which was changed after the findings of proximal eosinophilic inflammation.  She reports that she has had no noticeable change in her globus sensation.  Globus has persisted.  She also is having some indigestion but not really true heartburn.  Occasional burping.  Still some choking with liquids and occasionally dry meats like chicken.  She does have regurgitation.  No vomiting.  Bowel movements have been normal since colonoscopy though in the last several weeks occasionally slightly constipated versus per urgent stools.  She has been off of Citrucel and probiotic since colonoscopy.  Slightly more gas and bloating.  From a fatty liver perspective she states she was previously successful at weight loss though she gained some of this weight back.  She remains rather sedentary at work.  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   Review of Systems As per HPI, otherwise negative  Current Medications, Allergies, Past Medical History, Past Surgical History, Family  History and Social History were reviewed in [employer] record.    Objective:   Physical Exam BP 102/72   Pulse 72   Ht '5\' 5"'$  (1.651 m)   Wt 192 lb 2 oz (87.1 kg)   LMP 07/24/2010   SpO2 97%   BMI 31.97 kg/m  Gen: awake, alert, NAD HEENT: anicteric  Neuro: nonfocal  CMP     Component Value Date/Time   NA 144 07/13/2022 1125   K 4.2 07/13/2022 1125   CL 102 07/13/2022 1125   CO2 27 07/13/2022 1125   GLUCOSE 99 07/13/2022 1125   BUN 24 07/13/2022 1125   CREATININE 0.88 07/13/2022 1125   CALCIUM 10.3 (H) 07/13/2022 1125   PROT 7.4 07/13/2022 1125   ALBUMIN 4.6 07/13/2022 1125   AST 25 07/13/2022 1125   ALT 40 (H) 07/13/2022 1125   ALKPHOS 169 (H) 07/13/2022 1125   BILITOT 0.4 07/13/2022 1125   GFRNONAA 87 01/07/2021 1117   GFRAA 101 01/07/2021 1117      Latest Ref Rng & Units 07/13/2022   11:25 AM 07/11/2021    2:36 PM 01/07/2021   11:17 AM  CBC  WBC 3.4 - 10.8 x10E3/uL 7.5  8.2  8.8   Hemoglobin 11.1 - 15.9 g/dL 13.7  12.8  13.6   Hematocrit 34.0 - 46.6 % 41.1  38.4  39.7   Platelets 150 - 450 x10E3/uL 279  279  270        Assessment & Plan:   62 year old female with a history of GERD and esophagitis, MASH, history of adenomatous colon polyps, hyperlipidemia,  obesity, who is here for follow-up.    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  Despite this she is still having regurgitation, swallowing dysfunction and globus sensation.  She has biopsy-proven inflammation in the proximal esophagus but not the distal.  We discussed how this is either uncontrolled GERD with esophagitis or EOE.  Given her history of asthma allergy I favor EoE.  We discussed treatment as follows for today: -- Continue Dexilant 60 mg daily -- Add liquid budesonide twice daily x 8 weeks; will send a prescription for Myrtie Hawk (if not covered as this medicine is quite new and  may need budesonide slurry from compounding pharmacy); after 4 to 6 weeks of therapy she is asked to send me a MyChart message to notify me if symptoms are improving (if not we may consider adding high-dose famotidine at that time)  2.  Metabolic associated steatotic liver disease, no alcohol --we discussed this today at length.  She has been successful with weight watchers in the past and I suggested she consider this diet versus Noom.  I also encouraged her to use her Apple Watch fitness program to help with move, exercise and standing goals throughout the day.  Goal would be 10% weight reduction in 6 to 12 months which would get her to around 175 pounds. -- Diet and exercise modification as above -- Begin vitamin E 800 IU daily -- Check liver enzymes and INR at follow-up  3.  Mild constipation and urgent stools --add back Citrucel daily; if persistent bloating on follow-up consider adding back probiotic  Follow-up with me in June  40 minutes total spent today including patient facing time, coordination of care, reviewing medical history/procedures/pertinent radiology studies, and documentation of the encounter.

## 2023-01-15 ENCOUNTER — Encounter: Payer: Self-pay | Admitting: Internal Medicine

## 2023-01-16 ENCOUNTER — Other Ambulatory Visit (HOSPITAL_COMMUNITY): Payer: Self-pay

## 2023-01-16 ENCOUNTER — Telehealth: Payer: Self-pay | Admitting: Pharmacy Technician

## 2023-01-16 ENCOUNTER — Other Ambulatory Visit: Payer: Self-pay

## 2023-01-16 MED ORDER — AMBULATORY NON FORMULARY MEDICATION: 0 refills | Status: DC

## 2023-01-16 NOTE — Telephone Encounter (Signed)
Spoke with [facility] outpt pharmacy and they can order the medication. Script faxed to [facility].

## 2023-01-16 NOTE — Telephone Encounter (Signed)
Patient Advocate Encounter  Prior Authorization for Carla Morales has been approved.    PA# [id number] Effective dates: 3.5.24 through 3.4.25

## 2023-01-16 NOTE — Telephone Encounter (Signed)
Can you have pt check with her pharmacy and another pharmacy if needed. This med is FDA approved and now approved by her insurance for EoE It should be commercially available

## 2023-01-16 NOTE — Telephone Encounter (Signed)
Patient Advocate Encounter  Received notification from [facility] that prior authorization for [facility] is required.   PA submitted on 3.5.24 [facility] Status is pending

## 2023-01-19 ENCOUNTER — Other Ambulatory Visit (HOSPITAL_COMMUNITY): Payer: Self-pay

## 2023-01-22 ENCOUNTER — Ambulatory Visit
Admission: RE | Admit: 2023-01-22 | Discharge: 2023-01-22 | Disposition: A | Discharge status: Home / Self Care | Payer: BC Managed Care – PPO | Source: Ambulatory Visit | Attending: Family | Admitting: Family

## 2023-01-22 DIAGNOSIS — Z1231 Encounter for screening mammogram for malignant neoplasm of breast: Secondary | ICD-10-CM | POA: Diagnosis not present

## 2023-01-23 ENCOUNTER — Ambulatory Visit: Payer: BC Managed Care – PPO | Admitting: Family

## 2023-01-23 ENCOUNTER — Encounter: Payer: Self-pay | Admitting: Family

## 2023-01-23 VITALS — BP 126/80 | HR 78 | Temp 97.1°F | Ht 65.0 in | Wt 191.6 lb

## 2023-01-23 DIAGNOSIS — E7800 Pure hypercholesterolemia, unspecified: Secondary | ICD-10-CM | POA: Diagnosis not present

## 2023-01-23 DIAGNOSIS — J4520 Mild intermittent asthma, uncomplicated: Secondary | ICD-10-CM | POA: Diagnosis not present

## 2023-01-23 DIAGNOSIS — Z6831 Body mass index (BMI) 31.0-31.9, adult: Secondary | ICD-10-CM

## 2023-01-23 DIAGNOSIS — E559 Vitamin D deficiency, unspecified: Secondary | ICD-10-CM

## 2023-01-23 DIAGNOSIS — R609 Edema, unspecified: Secondary | ICD-10-CM | POA: Diagnosis not present

## 2023-01-23 DIAGNOSIS — J301 Allergic rhinitis due to pollen: Secondary | ICD-10-CM | POA: Diagnosis not present

## 2023-01-23 DIAGNOSIS — E669 Obesity, unspecified: Secondary | ICD-10-CM

## 2023-01-23 DIAGNOSIS — K219 Gastro-esophageal reflux disease without esophagitis: Secondary | ICD-10-CM

## 2023-01-23 DIAGNOSIS — R600 Localized edema: Secondary | ICD-10-CM

## 2023-01-23 LAB — CMP14+EGFR
ALT: 22 IU/L (ref 0–32)
AST: 16 IU/L (ref 0–40)
Albumin/Globulin Ratio: 1.5 (ref 1.2–2.2)
Albumin: 4.4 g/dL (ref 3.8–4.9)
Alkaline Phosphatase: 170 IU/L — ABNORMAL HIGH (ref 44–121)
BUN/Creatinine Ratio: 21 (ref 9–23)
BUN: 18 mg/dL (ref 6–24)
Bilirubin Total: 0.4 mg/dL (ref 0.0–1.2)
CO2: 23 mmol/L (ref 20–29)
Calcium: 9.7 mg/dL (ref 8.7–10.2)
Chloride: 102 mmol/L (ref 96–106)
Creatinine, Ser: 0.84 mg/dL (ref 0.57–1.00)
Globulin, Total: 2.9 g/dL (ref 1.5–4.5)
Glucose: 112 mg/dL — ABNORMAL HIGH (ref 70–99)
Potassium: 3.9 mmol/L (ref 3.5–5.2)
Sodium: 140 mmol/L (ref 134–144)
Total Protein: 7.3 g/dL (ref 6.0–8.5)
eGFR: 80 mL/min/{1.73_m2} (ref >59)

## 2023-01-23 MED ORDER — ATORVASTATIN CALCIUM 10 MG PO TABS: 10.0000 mg | ORAL_TABLET | Freq: Every day | ORAL | 0 refills | Status: DC

## 2023-01-23 MED ORDER — HYDROCHLOROTHIAZIDE 12.5 MG PO TABS: 12.5000 mg | ORAL_TABLET | Freq: Every day | ORAL | 0 refills | Status: DC

## 2023-01-23 MED ORDER — FLUTICASONE PROPIONATE 50 MCG/ACT NA SUSP: 2.0000 | Freq: Every day | NASAL | 6 refills | Status: DC

## 2023-01-23 MED ORDER — ALBUTEROL SULFATE HFA 108 (90 BASE) MCG/ACT IN AERS: INHALATION_SPRAY | RESPIRATORY_TRACT | 0 refills | Status: DC

## 2023-01-23 MED ORDER — NITROGLYCERIN 0.4 MG SL SUBL: 0.4000 mg | SUBLINGUAL_TABLET | SUBLINGUAL | 3 refills | Status: DC | PRN

## 2023-01-23 NOTE — Progress Notes (Signed)
Subjective:    Patient ID: Carla Morales, female    DOB: 04/23/1961, 62 y.o.   MRN: [id number]  Chief Complaint  Patient presents with   Medical Management of Chronic Issues   Pt presents to the office today for  chronic follow up. She is followed by GYN annually.    She is followed by GI every 6 months. She just started [place].  Asthma There is no cough, shortness of breath or wheezing. This is a chronic problem. The current episode started more than 1 year ago. The problem occurs intermittently. The problem has been waxing and waning. Associated symptoms include heartburn. Her symptoms are alleviated by rest. She reports moderate improvement on treatment. Her symptoms are not alleviated by rest. Her past medical history is significant for asthma.  Gastroesophageal Reflux She complains of belching and heartburn. She reports no coughing or no wheezing. The current episode started more than 1 year ago. The problem occurs occasionally. She has tried a PPI for the symptoms. The treatment provided mild relief.  Hyperlipidemia This is a chronic problem. The current episode started more than 1 year ago. The problem is controlled. Recent lipid tests were reviewed and are normal. Exacerbating diseases include obesity. Pertinent negatives include no shortness of breath. Current antihyperlipidemic treatment includes statins. The current treatment provides moderate improvement of lipids. Risk factors for coronary artery disease include dyslipidemia, hypertension and a sedentary lifestyle.      Review of Systems  Respiratory:  Negative for cough, shortness of breath and wheezing.   Gastrointestinal:  Positive for heartburn.  All other systems reviewed and are negative.      Objective:   Physical Exam Vitals reviewed.  Constitutional:      General: She is not in acute distress.    Appearance: She is well-developed.  HENT:     Head: Normocephalic and atraumatic.     Right Ear:  Tympanic membrane normal. There is no impacted cerumen.     Left Ear: Tympanic membrane normal.  Eyes:     Pupils: Pupils are equal, round, and reactive to light.  Neck:     Thyroid: No thyromegaly.  Cardiovascular:     Rate and Rhythm: Normal rate and regular rhythm.     Heart sounds: Normal heart sounds. No murmur heard. Pulmonary:     Effort: Pulmonary effort is normal. No respiratory distress.     Breath sounds: Normal breath sounds. No wheezing.  Abdominal:     General: Bowel sounds are normal. There is no distension.     Palpations: Abdomen is soft.     Tenderness: There is no abdominal tenderness.  Musculoskeletal:        General: No tenderness. Normal range of motion.     Cervical back: Normal range of motion and neck supple.  Skin:    General: Skin is warm and dry.  Neurological:     Mental Status: She is alert and oriented to person, place, and time.     Cranial Nerves: No cranial nerve deficit.     Deep Tendon Reflexes: Reflexes are normal and symmetric.  Psychiatric:        Behavior: Behavior normal.        Thought Content: Thought content normal.        Judgment: Judgment normal.       BP 126/80   Pulse 78   Temp (!) 97.1 F (36.2 C) (Temporal)   Ht '5\' 5"'$  (1.651 m)   Wt 191 lb 9.6  oz (86.9 kg)   LMP 07/24/2010   SpO2 99%   BMI 31.88 kg/m      Assessment & Plan:  Tamantha Sedberry comes in today with chief complaint of Medical Management of Chronic Issues   Diagnosis and orders addressed:  1. Acute seasonal allergic rhinitis due to pollen - CMP14+EGFR - fluticasone (FLONASE) 50 MCG/ACT nasal spray; Place 2 sprays into both nostrils daily.  Dispense: 16 g; Refill: 6 - albuterol (VENTOLIN HFA) 108 (90 Base) MCG/ACT inhaler; USE 2 INHALATIONS EVERY 6 HOURS AS NEEDED FOR WHEEZING OR SHORTNESS OF BREATH Need office visit for further refills  Dispense: 54 g; Refill: 0 - Ambulatory referral to Allergy  2. Pure hypercholesterolemia - atorvastatin  (LIPITOR) 10 MG tablet; Take 1 tablet (10 mg total) by mouth daily.  Dispense: 90 tablet; Refill: 0 - CMP14+EGFR  3. Peripheral edema - hydrochlorothiazide (HYDRODIURIL) 12.5 MG tablet; Take 1 tablet (12.5 mg total) by mouth daily.  Dispense: 90 tablet; Refill: 0 - CMP14+EGFR   5. Obesity (BMI 30-39.9) - CMP14+EGFR  6. Gastroesophageal reflux disease, unspecified whether esophagitis present  - CMP14+EGFR  7. Vitamin D deficiency - CMP14+EGFR  8. Mild intermittent asthma without complication - 0000000 - Ambulatory referral to Allergy    Labs pending Health Maintenance reviewed Diet and exercise encouraged  Follow up plan: 6 months    [name], FNP

## 2023-01-23 NOTE — Patient Instructions (Signed)

## 2023-01-24 ENCOUNTER — Other Ambulatory Visit (HOSPITAL_COMMUNITY): Payer: Self-pay

## 2023-01-24 MED ORDER — EOHILIA 2 MG/10ML PO SUSP
ORAL | 0 refills | Status: DC
  Filled 2023-01-24: qty 600, 30d supply, fill #0

## 2023-01-26 ENCOUNTER — Other Ambulatory Visit (HOSPITAL_COMMUNITY): Payer: Self-pay

## 2023-01-30 ENCOUNTER — Telehealth: Payer: BC Managed Care – PPO | Admitting: Family

## 2023-01-30 ENCOUNTER — Encounter: Payer: Self-pay | Admitting: Family

## 2023-01-30 DIAGNOSIS — M5412 Radiculopathy, cervical region: Secondary | ICD-10-CM

## 2023-01-30 MED ORDER — MELOXICAM 15 MG PO TABS: 15.0000 mg | ORAL_TABLET | Freq: Every day | ORAL | 0 refills | Status: DC

## 2023-01-30 MED ORDER — BACLOFEN 10 MG PO TABS: 10.0000 mg | ORAL_TABLET | Freq: Three times a day (TID) | ORAL | 0 refills | Status: DC

## 2023-01-30 NOTE — Progress Notes (Signed)
Virtual Visit Consent   Carla Morales [facility], you are scheduled for a virtual visit with a Carla Morales provider today. Just as with appointments in the office, your consent must be obtained to participate. Your consent will be active for this visit and any virtual visit you may have with one of our providers in the next 365 days. If you have a MyChart account, a copy of this consent can be sent to you electronically.  As this is a virtual visit, video technology does not allow for your provider to perform a traditional examination. This may limit your provider's ability to fully assess your condition. If your provider identifies any concerns that need to be evaluated in person or the need to arrange testing (such as labs, EKG, etc.), we will make arrangements to do so. Although advances in technology are sophisticated, we cannot ensure that it will always work on either your end or our end. If the connection with a video visit is poor, the visit may have to be switched to a telephone visit. With either a video or telephone visit, we are not always able to ensure that we have a secure connection.  By engaging in this virtual visit, you consent to the provision of healthcare and authorize for your insurance to be billed (if applicable) for the services provided during this visit. Depending on your insurance coverage, you may receive a charge related to this service.  I need to obtain your verbal consent now. Are you willing to proceed with your visit today? Carla Morales has provided verbal consent on 01/30/2023 for a virtual visit (video or telephone). Carla Dun, FNP  Date: 01/30/2023 10:48 AM  Virtual Visit via Video Note   I, Carla Morales, connected with  Carla Morales  ([id number], March 02, 1961) on 01/30/23 at 10:50 AM EDT by a video-enabled telemedicine application and verified that I am speaking with the correct person using two identifiers.  Location: Patient: Virtual Visit  Location Patient: Other: work Provider: Ecologist: Home Office   I discussed the limitations of evaluation and management by telemedicine and the availability of in person appointments. The patient expressed understanding and agreed to proceed.    History of Present Illness: Carla Morales is a 62 y.o. who identifies as a female who was assigned female at birth, and is being seen today for neck pain that radiates down her left arm.  HPI: Neck Pain  This is a new problem. The current episode started today. The problem occurs constantly. The problem has been unchanged. The pain is associated with nothing (buttoning up her shirt). The pain is present in the left side. The quality of the pain is described as aching. The pain is at a severity of 7/10. The pain is mild. The symptoms are aggravated by twisting. Pertinent negatives include no photophobia, syncope, tingling or trouble swallowing. She has tried acetaminophen and NSAIDs for the symptoms. The treatment provided mild relief.    Problems:  Patient Active Problem List   Diagnosis Date Noted   Peripheral edema 07/11/2021   Asthma 01/07/2021   GERD (gastroesophageal reflux disease) 10/29/2019   Vitamin D deficiency 10/29/2019   Family history of breast cancer    Family history of uterine cancer    Family history of lung cancer    Family history of melanoma    Obesity (BMI 30-39.9) 03/29/2018   Allergic rhinitis    Hyperlipidemia     Allergies:  Allergies  Allergen Reactions  Other Swelling    Contrast dye   Contrast Media [Iodinated Contrast Media]    Haldol [Haloperidol Decanoate]     anxiety   Pravastatin Other (See Comments)    myalgias   Sulfa Antibiotics Rash   Medications:  Current Outpatient Medications:    baclofen (LIORESAL) 10 MG tablet, Take 1 tablet (10 mg total) by mouth 3 (three) times daily., Disp: 60 each, Rfl: 0   meloxicam (MOBIC) 15 MG tablet, Take 1 tablet (15 mg total) by  mouth daily., Disp: 90 tablet, Rfl: 0   albuterol (VENTOLIN HFA) 108 (90 Base) MCG/ACT inhaler, USE 2 INHALATIONS EVERY 6 HOURS AS NEEDED FOR WHEEZING OR SHORTNESS OF BREATH Need office visit for further refills, Disp: 54 g, Rfl: 0   atorvastatin (LIPITOR) 10 MG tablet, Take 1 tablet (10 mg total) by mouth daily., Disp: 90 tablet, Rfl: 0   Budesonide (EOHILIA) 2 MG/10ML SUSP, Take 10 ml by mouth 2 times a day for 12 weeks, Disp: 1680 mL, Rfl: 0   dexlansoprazole (DEXILANT) 60 MG capsule, Take 1 capsule (60 mg total) by mouth daily., Disp: 90 capsule, Rfl: 3   diphenhydrAMINE (BENADRYL) 12.5 MG/5ML liquid, Take by mouth at bedtime as needed., Disp: , Rfl:    EOHILIA 2 MG/10ML SUSP, Take 10 mLs by mouth 2 (two) times daily., Disp: , Rfl:    fluticasone (FLONASE) 50 MCG/ACT nasal spray, Place 2 sprays into both nostrils daily., Disp: 16 g, Rfl: 6   hydrochlorothiazide (HYDRODIURIL) 12.5 MG tablet, Take 1 tablet (12.5 mg total) by mouth daily., Disp: 90 tablet, Rfl: 0   levocetirizine (XYZAL) 5 MG tablet, Take 5 mg by mouth in the morning., Disp: , Rfl:   Observations/Objective: Patient is well-developed, well-nourished in no acute distress.  Head is normocephalic, atraumatic.  No labored breathing.  Speech is clear and coherent with logical content.  Patient is alert and oriented at baseline.  Full ROM of neck and arm  Assessment and Plan: 1. Cervical radiculopathy - meloxicam (MOBIC) 15 MG tablet; Take 1 tablet (15 mg total) by mouth daily.  Dispense: 90 tablet; Refill: 0 - baclofen (LIORESAL) 10 MG tablet; Take 1 tablet (10 mg total) by mouth 3 (three) times daily.  Dispense: 60 each; Refill: 0  No other NSAID's while taking mobic Baclofen TID prn Sedation precautions  Follow up if symptoms worsen or do not improve   Follow Up Instructions: I discussed the assessment and treatment plan with the patient. The patient was provided an opportunity to ask questions and all were answered. The  patient agreed with the plan and demonstrated an understanding of the instructions.  A copy of instructions were sent to the patient via MyChart unless otherwise noted below.    The patient was advised to call back or seek an in-person evaluation if the symptoms worsen or if the condition fails to improve as anticipated.  Time:  I spent 7 minutes with the patient via telehealth technology discussing the above problems/concerns.    Carla Dun, FNP

## 2023-02-01 DIAGNOSIS — M9901 Segmental and somatic dysfunction of cervical region: Secondary | ICD-10-CM | POA: Diagnosis not present

## 2023-02-01 DIAGNOSIS — M5412 Radiculopathy, cervical region: Secondary | ICD-10-CM | POA: Diagnosis not present

## 2023-02-01 DIAGNOSIS — M9903 Segmental and somatic dysfunction of lumbar region: Secondary | ICD-10-CM | POA: Diagnosis not present

## 2023-02-01 DIAGNOSIS — M9902 Segmental and somatic dysfunction of thoracic region: Secondary | ICD-10-CM | POA: Diagnosis not present

## 2023-02-05 DIAGNOSIS — M9902 Segmental and somatic dysfunction of thoracic region: Secondary | ICD-10-CM | POA: Diagnosis not present

## 2023-02-05 DIAGNOSIS — M9903 Segmental and somatic dysfunction of lumbar region: Secondary | ICD-10-CM | POA: Diagnosis not present

## 2023-02-05 DIAGNOSIS — M9901 Segmental and somatic dysfunction of cervical region: Secondary | ICD-10-CM | POA: Diagnosis not present

## 2023-02-05 DIAGNOSIS — M5412 Radiculopathy, cervical region: Secondary | ICD-10-CM | POA: Diagnosis not present

## 2023-02-07 DIAGNOSIS — M9903 Segmental and somatic dysfunction of lumbar region: Secondary | ICD-10-CM | POA: Diagnosis not present

## 2023-02-07 DIAGNOSIS — M5412 Radiculopathy, cervical region: Secondary | ICD-10-CM | POA: Diagnosis not present

## 2023-02-07 DIAGNOSIS — M9902 Segmental and somatic dysfunction of thoracic region: Secondary | ICD-10-CM | POA: Diagnosis not present

## 2023-02-07 DIAGNOSIS — M9901 Segmental and somatic dysfunction of cervical region: Secondary | ICD-10-CM | POA: Diagnosis not present

## 2023-02-08 DIAGNOSIS — M5412 Radiculopathy, cervical region: Secondary | ICD-10-CM | POA: Diagnosis not present

## 2023-02-08 DIAGNOSIS — M9902 Segmental and somatic dysfunction of thoracic region: Secondary | ICD-10-CM | POA: Diagnosis not present

## 2023-02-08 DIAGNOSIS — M9903 Segmental and somatic dysfunction of lumbar region: Secondary | ICD-10-CM | POA: Diagnosis not present

## 2023-02-08 DIAGNOSIS — M9901 Segmental and somatic dysfunction of cervical region: Secondary | ICD-10-CM | POA: Diagnosis not present

## 2023-02-19 ENCOUNTER — Telehealth: Payer: Self-pay | Admitting: Internal Medicine

## 2023-02-19 DIAGNOSIS — M9902 Segmental and somatic dysfunction of thoracic region: Secondary | ICD-10-CM | POA: Diagnosis not present

## 2023-02-19 DIAGNOSIS — M9903 Segmental and somatic dysfunction of lumbar region: Secondary | ICD-10-CM | POA: Diagnosis not present

## 2023-02-19 DIAGNOSIS — M9901 Segmental and somatic dysfunction of cervical region: Secondary | ICD-10-CM | POA: Diagnosis not present

## 2023-02-19 DIAGNOSIS — M5412 Radiculopathy, cervical region: Secondary | ICD-10-CM | POA: Diagnosis not present

## 2023-02-19 MED ORDER — FLUCONAZOLE 100 MG PO TABS: ORAL_TABLET | ORAL | 0 refills | Status: DC

## 2023-02-19 NOTE — Telephone Encounter (Signed)
Spoke with pt and she is aware of Dr. Lauro Franklin recommendations. States the med has helped with her swallowing. Script sent to pharmacy for fluconazole.

## 2023-02-19 NOTE — Telephone Encounter (Signed)
Please see note below from Pt and advise.

## 2023-02-19 NOTE — Telephone Encounter (Signed)
Inbound call from patient, states she is having issues accessing her EOHILIA. States the pharmacy, does not have it in stock and they have to order it, she states she has been off of it for 2 days and would like to know if she will experience any side effects due to not taking it every day like prescribed. States she also believes she has thrush due to the steroids in the medication and would like medication prescribed to aleve those symptoms.

## 2023-02-19 NOTE — Telephone Encounter (Signed)
Would not expect her to have symptoms from missing this medication for a few days Has it helped with her trouble swallowing which is due to her EoE? Okay to prescribe fluconazole 200 mg x 1 day and 100 mg x 13 days for Candida associated with budesonide therapy

## 2023-02-21 DIAGNOSIS — M9901 Segmental and somatic dysfunction of cervical region: Secondary | ICD-10-CM | POA: Diagnosis not present

## 2023-02-21 DIAGNOSIS — M9902 Segmental and somatic dysfunction of thoracic region: Secondary | ICD-10-CM | POA: Diagnosis not present

## 2023-02-21 DIAGNOSIS — M9903 Segmental and somatic dysfunction of lumbar region: Secondary | ICD-10-CM | POA: Diagnosis not present

## 2023-02-21 DIAGNOSIS — M5412 Radiculopathy, cervical region: Secondary | ICD-10-CM | POA: Diagnosis not present

## 2023-02-22 DIAGNOSIS — M9903 Segmental and somatic dysfunction of lumbar region: Secondary | ICD-10-CM | POA: Diagnosis not present

## 2023-02-22 DIAGNOSIS — M9902 Segmental and somatic dysfunction of thoracic region: Secondary | ICD-10-CM | POA: Diagnosis not present

## 2023-02-22 DIAGNOSIS — M5412 Radiculopathy, cervical region: Secondary | ICD-10-CM | POA: Diagnosis not present

## 2023-02-22 DIAGNOSIS — M9901 Segmental and somatic dysfunction of cervical region: Secondary | ICD-10-CM | POA: Diagnosis not present

## 2023-02-26 DIAGNOSIS — M5412 Radiculopathy, cervical region: Secondary | ICD-10-CM | POA: Diagnosis not present

## 2023-02-26 DIAGNOSIS — M9901 Segmental and somatic dysfunction of cervical region: Secondary | ICD-10-CM | POA: Diagnosis not present

## 2023-02-26 DIAGNOSIS — M9902 Segmental and somatic dysfunction of thoracic region: Secondary | ICD-10-CM | POA: Diagnosis not present

## 2023-02-26 DIAGNOSIS — M9903 Segmental and somatic dysfunction of lumbar region: Secondary | ICD-10-CM | POA: Diagnosis not present

## 2023-02-28 DIAGNOSIS — M9902 Segmental and somatic dysfunction of thoracic region: Secondary | ICD-10-CM | POA: Diagnosis not present

## 2023-02-28 DIAGNOSIS — M9903 Segmental and somatic dysfunction of lumbar region: Secondary | ICD-10-CM | POA: Diagnosis not present

## 2023-02-28 DIAGNOSIS — M9901 Segmental and somatic dysfunction of cervical region: Secondary | ICD-10-CM | POA: Diagnosis not present

## 2023-02-28 DIAGNOSIS — M5412 Radiculopathy, cervical region: Secondary | ICD-10-CM | POA: Diagnosis not present

## 2023-03-01 DIAGNOSIS — M9903 Segmental and somatic dysfunction of lumbar region: Secondary | ICD-10-CM | POA: Diagnosis not present

## 2023-03-01 DIAGNOSIS — M9902 Segmental and somatic dysfunction of thoracic region: Secondary | ICD-10-CM | POA: Diagnosis not present

## 2023-03-01 DIAGNOSIS — M9901 Segmental and somatic dysfunction of cervical region: Secondary | ICD-10-CM | POA: Diagnosis not present

## 2023-03-01 DIAGNOSIS — M5412 Radiculopathy, cervical region: Secondary | ICD-10-CM | POA: Diagnosis not present

## 2023-03-05 DIAGNOSIS — M5412 Radiculopathy, cervical region: Secondary | ICD-10-CM | POA: Diagnosis not present

## 2023-03-05 DIAGNOSIS — M9901 Segmental and somatic dysfunction of cervical region: Secondary | ICD-10-CM | POA: Diagnosis not present

## 2023-03-05 DIAGNOSIS — M9902 Segmental and somatic dysfunction of thoracic region: Secondary | ICD-10-CM | POA: Diagnosis not present

## 2023-03-05 DIAGNOSIS — M9903 Segmental and somatic dysfunction of lumbar region: Secondary | ICD-10-CM | POA: Diagnosis not present

## 2023-03-07 DIAGNOSIS — M9903 Segmental and somatic dysfunction of lumbar region: Secondary | ICD-10-CM | POA: Diagnosis not present

## 2023-03-07 DIAGNOSIS — M9901 Segmental and somatic dysfunction of cervical region: Secondary | ICD-10-CM | POA: Diagnosis not present

## 2023-03-07 DIAGNOSIS — M9902 Segmental and somatic dysfunction of thoracic region: Secondary | ICD-10-CM | POA: Diagnosis not present

## 2023-03-07 DIAGNOSIS — M5412 Radiculopathy, cervical region: Secondary | ICD-10-CM | POA: Diagnosis not present

## 2023-03-08 DIAGNOSIS — M9903 Segmental and somatic dysfunction of lumbar region: Secondary | ICD-10-CM | POA: Diagnosis not present

## 2023-03-08 DIAGNOSIS — M9902 Segmental and somatic dysfunction of thoracic region: Secondary | ICD-10-CM | POA: Diagnosis not present

## 2023-03-08 DIAGNOSIS — M5412 Radiculopathy, cervical region: Secondary | ICD-10-CM | POA: Diagnosis not present

## 2023-03-08 DIAGNOSIS — M9901 Segmental and somatic dysfunction of cervical region: Secondary | ICD-10-CM | POA: Diagnosis not present

## 2023-03-12 DIAGNOSIS — M5412 Radiculopathy, cervical region: Secondary | ICD-10-CM | POA: Diagnosis not present

## 2023-03-12 DIAGNOSIS — M9901 Segmental and somatic dysfunction of cervical region: Secondary | ICD-10-CM | POA: Diagnosis not present

## 2023-03-12 DIAGNOSIS — M9902 Segmental and somatic dysfunction of thoracic region: Secondary | ICD-10-CM | POA: Diagnosis not present

## 2023-03-12 DIAGNOSIS — M9903 Segmental and somatic dysfunction of lumbar region: Secondary | ICD-10-CM | POA: Diagnosis not present

## 2023-03-15 DIAGNOSIS — M9901 Segmental and somatic dysfunction of cervical region: Secondary | ICD-10-CM | POA: Diagnosis not present

## 2023-03-15 DIAGNOSIS — M9903 Segmental and somatic dysfunction of lumbar region: Secondary | ICD-10-CM | POA: Diagnosis not present

## 2023-03-15 DIAGNOSIS — M9902 Segmental and somatic dysfunction of thoracic region: Secondary | ICD-10-CM | POA: Diagnosis not present

## 2023-03-15 DIAGNOSIS — M5412 Radiculopathy, cervical region: Secondary | ICD-10-CM | POA: Diagnosis not present

## 2023-03-20 DIAGNOSIS — J9801 Acute bronchospasm: Secondary | ICD-10-CM | POA: Diagnosis not present

## 2023-03-20 DIAGNOSIS — R051 Acute cough: Secondary | ICD-10-CM | POA: Diagnosis not present

## 2023-03-20 DIAGNOSIS — M9902 Segmental and somatic dysfunction of thoracic region: Secondary | ICD-10-CM | POA: Diagnosis not present

## 2023-03-20 DIAGNOSIS — M9903 Segmental and somatic dysfunction of lumbar region: Secondary | ICD-10-CM | POA: Diagnosis not present

## 2023-03-20 DIAGNOSIS — J40 Bronchitis, not specified as acute or chronic: Secondary | ICD-10-CM | POA: Diagnosis not present

## 2023-03-20 DIAGNOSIS — M5412 Radiculopathy, cervical region: Secondary | ICD-10-CM | POA: Diagnosis not present

## 2023-03-20 DIAGNOSIS — M9901 Segmental and somatic dysfunction of cervical region: Secondary | ICD-10-CM | POA: Diagnosis not present

## 2023-03-21 DIAGNOSIS — M9902 Segmental and somatic dysfunction of thoracic region: Secondary | ICD-10-CM | POA: Diagnosis not present

## 2023-03-21 DIAGNOSIS — M9901 Segmental and somatic dysfunction of cervical region: Secondary | ICD-10-CM | POA: Diagnosis not present

## 2023-03-21 DIAGNOSIS — M5412 Radiculopathy, cervical region: Secondary | ICD-10-CM | POA: Diagnosis not present

## 2023-03-21 DIAGNOSIS — M9903 Segmental and somatic dysfunction of lumbar region: Secondary | ICD-10-CM | POA: Diagnosis not present

## 2023-03-26 NOTE — Progress Notes (Unsigned)
NEW PATIENT Date of Service/Encounter:  03/28/23 Referring provider: Junie Spencer, FNP Primary care provider: Junie Spencer, FNP  Subjective:  Carla Morales is a 62 y.o. female with a PMHx of hyperlipidemia, obesity, GERD, vitamin D deficiency presenting today for evaluation of chronic rhinitis and asthma. History obtained from: chart review and patient.   Asthma History: Intermittent -Diagnosed as an adult -Current symptoms include shortness of breath Using rescue inhaler several times per month -Limitations to daily activity:  moderate - 0 ED visits, 2 UC visits and 2-3 oral steroids in the past year - 0 number of lifetime hospitalizations, 0 number of lifetime intubations.  - Identified Triggers: exercise and respiratory illness - Not Up-to-date with pneumonia,, Covid-19,, and Flu, vaccines. Has had 1 covid booster, but none since.  - History of prior pneumonias: no - History of prior COVID-19 infection: 2020 and 2021-asthma worsened after this - Smoking exposure: no Previous Diagnostics:  - Most Recent AEC (07/13/22): 200 -Most Recent Chest Imaging: CXR on (2022): normal Management:  - Previously used therapies: QVAR-had oral thrush. Has been a few years since she was on a controller inhaler.  - Current regimen:  - Rescue: Albuterol 2 puffs q4-6 hrs PRN, not using prior to exercise  Chronic rhinitis:  Symptoms include: hoarse voice, feeling something is stuck in her throat, eyes watery, feel blurry like a film over them, runny nose, generalized pruritus Occurs year-round with seasonal flares-spring and fall Treatments tried: flonase, xyzal  Previous allergy testing: no History of reflux/heartburn: yes Previous sinus, ear, tonsil, adenoid surgeries: tonsillectomy in childhood  She does have elevated liver enzymes - NASH - and has been told this may be cause of her generalized pruritus.  No rash unless scratching. She will use xyzal daily, and benadryl PRN as  needed.   Reviewed PCP visit 01/23/23 for asthma and allergic rhinitis-referred to AI, continue flonase and albuterol PRN.  She follows with [facility] and had a endoscopy in December 2023 while on dexilant.  She just finished 8 weeks of budesonide. Suspected probably EoE although reportedly her numbers were not diagnostic of EoE, suspected due to dexilant. She has been choked on liquid. Had a swallow study. Chicken or dry meat can cause her to choke. She has had 2 esophageal dilations.  Report from endoscopy 10/18/22  Biopsy results not available for my review, summary from [facility]: 1.  Persistent inflammation in the esophagus consistent with reflux despite twice daily omeprazole; this means symptoms likely reflux related but not adequately treated with current PPI 2.  3 adenomatous colon polyps without high-grade dysplasia   Other allergy screening: Food allergy: no Medication allergy: no Hymenoptera allergy: no Eczema:no Past Medical History: Past Medical History:  Diagnosis Date   Adenomatous colon polyp    Allergy    Arthritis    Asthma    COVID-19    Diverticulosis    Elevated alkaline phosphatase level    Elevated antinuclear antibody (ANA) level    Eosinophilic esophagitis    Esophageal dysmotility    Esophageal stenosis    Fatty liver    GERD (gastroesophageal reflux disease)    Hyperlipidemia    Internal hemorrhoids    Migraine    Pinched nerve in neck    Schatzki's ring    Sinus congestion    Urinary incontinence    Medication List:  Current Outpatient Medications  Medication Sig Dispense Refill   albuterol (VENTOLIN HFA) 108 (90 Base) MCG/ACT inhaler USE 2 INHALATIONS  EVERY 6 HOURS AS NEEDED FOR WHEEZING OR SHORTNESS OF BREATH Need office visit for further refills 54 g 0   atorvastatin (LIPITOR) 10 MG tablet Take 1 tablet (10 mg total) by mouth daily. 90 tablet 0   azelastine (ASTELIN) 0.1 % nasal spray Place 2 sprays into both nostrils 2 (two) times  daily as needed for rhinitis. Use in each nostril as directed 30 mL 5   azithromycin (ZITHROMAX) 250 MG tablet Take 250 mg by mouth once.     baclofen (LIORESAL) 10 MG tablet Take 1 tablet (10 mg total) by mouth 3 (three) times daily. 60 each 0   Budesonide (EOHILIA) 2 MG/10ML SUSP Take 10 ml by mouth 2 times a day for 12 weeks 1680 mL 0   dexlansoprazole (DEXILANT) 60 MG capsule Take 1 capsule (60 mg total) by mouth daily. 90 capsule 3   fluconazole (DIFLUCAN) 100 MG tablet Take 2 tabs by mouth on day 1 then take 1 tablet daily until gone. (Patient not taking: Reported on 03/28/2023) 15 tablet 0   fluticasone (FLONASE) 50 MCG/ACT nasal spray Place 2 sprays into both nostrils daily. 16 g 6   hydrochlorothiazide (HYDRODIURIL) 12.5 MG tablet Take 1 tablet (12.5 mg total) by mouth daily. 90 tablet 0   hydrOXYzine (ATARAX) 25 MG tablet Take 1-2 tablets nightly as needed for itching. 60 tablet 3   levocetirizine (XYZAL) 5 MG tablet Take 5 mg by mouth in the morning.     meloxicam (MOBIC) 15 MG tablet Take 1 tablet (15 mg total) by mouth daily. 90 tablet 0   Mometasone Furo-Formoterol Fum (DULERA) 50-5 MCG/ACT AERO Inhale 2 Inhalations into the lungs in the morning and at bedtime. Use with spacer, rinse mouth after use. 1 each 5   nitroGLYCERIN (NITROSTAT) 0.4 MG SL tablet Place 0.4 mg under the tongue every 5 (five) minutes as needed.     EOHILIA 2 MG/10ML SUSP Take 10 mLs by mouth 2 (two) times daily. (Patient not taking: Reported on 03/28/2023)     No current facility-administered medications for this visit.   Known Allergies:  Allergies  Allergen Reactions   Other Swelling    Contrast dye   Contrast Media [Iodinated Contrast Media]    Haldol [Haloperidol Decanoate]     anxiety   Pravastatin Other (See Comments)    myalgias   Sulfa Antibiotics Rash   Past Surgical History: Past Surgical History:  Procedure Laterality Date   ABDOMINAL HYSTERECTOMY     bladder dialation  1968   as a child    COLONOSCOPY     TONSILLECTOMY  1971   TOTAL VAGINAL HYSTERECTOMY  07/2010   menorrhagia, leiomyoma, prolapse   TUBAL LIGATION  1998   Family History: Family History  Problem Relation Age of Onset   Allergic rhinitis Mother    Asthma Mother    Rheum arthritis Mother    Fibromyalgia Mother    Cancer Mother        breast, uterine, adenocarcinoma-right lung   Uterine cancer Mother        Sarcoma   Lung cancer Mother        Adenocarcinoma   Breast cancer Mother 62   Hypertension Father    Melanoma Father 39   Asthma Sister    Asthma Maternal Aunt    Diabetes Maternal Aunt    Cancer Maternal Aunt 55       GYN cancer - possibly vulvar?   Lung cancer Maternal Aunt    Diabetes  Maternal Uncle    Appendicitis Maternal Uncle        d. 12   Diabetes Paternal Aunt    Breast cancer Paternal Aunt 57   Breast cancer Paternal Aunt 12   Uterine cancer Paternal Aunt 62   Breast cancer Paternal Aunt 11   Diabetes Paternal Uncle    Other Maternal Grandmother 30       childbirth   Heart disease Maternal Grandfather    Cancer Paternal Grandmother        unknown   Other Daughter        Phyllodes tumor   Other Nephew        Gilbert's Disease   Colon cancer Neg Hx    Esophageal cancer Neg Hx    Ulcerative colitis Neg Hx    Stomach cancer Neg Hx    Pancreatic cancer Neg Hx    Social History: Johnine lives in a house built 34 years ago, no water damage, carpet in the bedroom, no pets, no roaches, not using DM protection on bedding or pillows, works as a Data processing manager, [place]   ROS:  All other systems negative except as noted per HPI.  Objective:  Blood pressure 104/72, pulse 74, temperature 97.7 F (36.5 C), temperature source Temporal, height 5\' 6"  (1.676 m), weight 193 lb 6.4 oz (87.7 kg), last menstrual period 07/24/2010, SpO2 98 %. Body mass index is 31.22 kg/m. Physical Exam:  General Appearance:  Alert, cooperative, no distress, appears  stated age  Head:  Normocephalic, without obvious abnormality, atraumatic  Eyes:  Conjunctiva clear, EOM's intact  Nose: Nares normal, hypertrophic turbinates, normal mucosa, and no visible anterior polyps  Throat: Lips, tongue normal; teeth and gums normal, normal posterior oropharynx  Neck: Supple, symmetrical  Lungs:   clear to auscultation bilaterally, Respirations unlabored, no coughing  Heart:  regular rate and rhythm and no murmur, Appears well perfused  Extremities: No edema  Skin: Skin color, texture, turgor normal and no rashes or lesions on visualized portions of skin  Neurologic: No gross deficits   Diagnostics: Spirometry:  Tracings reviewed. Her effort: Good reproducible efforts. FVC: 2.90L ( FEV1: 2.48L, 92% predicted  FEV1/FVC ratio: 0.86 Interpretation: Spirometry consistent with normal pattern   Skin Testing: Environmental allergy panel and select foods.  Adequate controls. Results discussed with patient/family.  Airborne Adult Perc - 03/28/23 1001     Time Antigen Placed 5784    Allergen Manufacturer Waynette Buttery    Location Back    Number of Test 59    1. Control-Buffer 50% Glycerol Negative    2. Control-Histamine 1 mg/ml 3+    3. Albumin saline Negative    4. Bahia Negative    5. French Southern Territories Negative    6. Johnson Negative    7. Kentucky Blue Negative    8. Meadow Fescue Negative    9. Perennial Rye Negative    10. Sweet Vernal Negative    11. Timothy Negative    12. Cocklebur Negative    13. Burweed Marshelder Negative    14. Ragweed, short Negative    15. Ragweed, Giant Negative    16. Plantain,  English Negative    17. Lamb's Quarters Negative    18. Sheep Sorrell Negative    19. Rough Pigweed Negative    20. Marsh Elder, Rough Negative    21. Mugwort, Common Negative    22. Ash mix Negative    23. Birch mix Negative    24. [place] Clines  American Negative    25. Box, Elder Negative    26. Cedar, red Negative    27. Cottonwood, Guinea[place]iss[place] Negative    28. Elm  mix Negative    29. Hickory Negative    30. Maple mix Negative    31. Oak, [place] mix Negative    32. Pecan Pollen Negative    33. Pine mix Negative    35. Walnut, Black Pollen Negative    36. Alternaria alternata Negative    37. Cladosporium Herbarum Negative    38. Aspergillus mix Negative    39. Penicillium mix Negative    40. Bipolaris sorokiniana (Helminthosporium) Negative    41. Drechslera spicifera (Curvularia) Negative    42. Mucor plumbeus Negative    43. Fusarium moniliforme Negative    44. Aureobasidium pullulans (pullulara) Negative    45. Rhizopus oryzae Negative    46. Botrytis cinera Negative    47. Epicoccum nigrum Negative    48. Phoma betae Negative    49. Candida Albicans Negative    50. Trichophyton mentagrophytes Negative    51. Mite, D Farinae  5,000 AU/ml 2+    52. Mite, D Pteronyssinus  5,000 AU/ml 2+    53. Cat Hair 10,000 BAU/ml Negative    54.  Dog Epithelia Negative    55. Mixed Feathers Negative    56. Horse Epithelia Negative    57. Cockroach, German Negative    58. Mouse Negative    59. Tobacco Leaf Negative             Intradermal - 03/28/23 1003     Time Antigen Placed 1004    Allergen Manufacturer Greer    Location Back    Number of Test 14    Control Negative    French Southern Territories Negative    Johnson Negative    7 Grass Negative    Ragweed mix Negative    Weed mix Negative    Tree mix Negative    Mold 1 Negative    Mold 2 Negative    Mold 3 Negative    Mold 4 3+    Cat Negative    Dog Negative    Cockroach Negative             Food Adult Perc - 03/28/23 1000     Time Antigen Placed 4098    Allergen Manufacturer Waynette Buttery    Location Back     Control-buffer 50% Glycerol Negative    Control-Histamine 1 mg/ml 3+    1. Peanut Negative    2. Soybean Negative    3. Wheat Negative    4. Sesame Negative    5. Milk, cow Negative    6. Egg White, Chicken Negative    7. Casein Negative    8. Shellfish Mix Negative    9. Fish Mix  Negative    10. Cashew Negative    37. Pork Negative    38. [place] Meat Negative    39. Chicken Meat Negative    43. White Potato Negative    63. Pineapple Negative             Allergy testing results were read and interpreted by myself, documented by clinical staff.  Assessment and Plan  Mild persistent Asthma: - your lung testing today looked good, but you are asymptomatic currently and your history is suggestive of not controlled asthma due to need for oral steroids - Controller Inhaler: Start Dulera 50 mcg 2 puffs twice a day; This Should  Be Used Everyday - Rinse mouth out after use - use with spacer (provided in clinic) - Rescue Inhaler: Albuterol (Proair/Ventolin) 2 puffs . Use  every 4-6 hours as needed for chest tightness, wheezing, or coughing.  Can also use 15 minutes prior to exercise if you have symptoms with activity. - Asthma is not controlled if:  - Symptoms are occurring >2 times a week OR  - >2 times a month nighttime awakenings  - You are requiring systemic steroids (prednisone/steroid injections) more than once per year  - Your require hospitalization for your asthma.  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  - Consider Astelin (azelastine) 1-2 sprays in each nostril twice a day as needed for nasal congestion/itchy nose - Continue Xyzal (Levocetirinze) 5mg  daily as needed  Allergic Conjunctivitis:  - Consider Allergy Eye  drops-great options include Pataday (Olopatadine) or Zaditor (ketotifen) for eye symptoms daily as needed-both sold over the counter if not covered by insurance.  -Avoid eye drops that say red eye relief as they may contain medications that dry out your eyes.  Generalized pruritus: responsive to antihistamines. - this could be due to elevated liver enzymes - continue management of NASH - continue xyzal daily and consider hydroxyzine 1-2 tablets nightly as needed for itching  GERD + possible EoE:  - continue management per GI - may consider dupixent in the future if biopsy proven EoE (will likely need to stop all medications and repeat a biopsy) - food allergy testing today to most common food allergens, as well as potatoes, chicken, pork, beef and pineapple were all negative (copy of results provided); if a food elimination diet is to be considered, would consider first dairy elimination  Follow up : 3 months, sooner if needed It was a pleasure meeting you in clinic today! Thank you for allowing me to participate in your care.  This note in its entirety was forwarded to the Provider who requested this consultation.  Thank you for your kind referral. I appreciate the opportunity to take part in Deniss's care. Please do not hesitate to contact me with questions.  Sincerely,  Tonny Bollman, MD [facility]

## 2023-03-28 ENCOUNTER — Ambulatory Visit: Payer: BC Managed Care – PPO | Admitting: Internal Medicine

## 2023-03-28 ENCOUNTER — Encounter: Payer: Self-pay | Admitting: Internal Medicine

## 2023-03-28 ENCOUNTER — Other Ambulatory Visit: Payer: Self-pay

## 2023-03-28 VITALS — BP 104/72 | HR 74 | Temp 97.7°F | Ht 66.0 in | Wt 193.4 lb

## 2023-03-28 DIAGNOSIS — K2100 Gastro-esophageal reflux disease with esophagitis, without bleeding: Secondary | ICD-10-CM | POA: Diagnosis not present

## 2023-03-28 DIAGNOSIS — J3089 Other allergic rhinitis: Secondary | ICD-10-CM | POA: Diagnosis not present

## 2023-03-28 DIAGNOSIS — T781XXA Other adverse food reactions, not elsewhere classified, initial encounter: Secondary | ICD-10-CM

## 2023-03-28 DIAGNOSIS — J4530 Mild persistent asthma, uncomplicated: Secondary | ICD-10-CM | POA: Diagnosis not present

## 2023-03-28 DIAGNOSIS — H1013 Acute atopic conjunctivitis, bilateral: Secondary | ICD-10-CM | POA: Insufficient documentation

## 2023-03-28 DIAGNOSIS — J45998 Other asthma: Secondary | ICD-10-CM | POA: Diagnosis not present

## 2023-03-28 DIAGNOSIS — L299 Pruritus, unspecified: Secondary | ICD-10-CM | POA: Insufficient documentation

## 2023-03-28 DIAGNOSIS — J310 Chronic rhinitis: Secondary | ICD-10-CM

## 2023-03-28 MED ORDER — HYDROXYZINE HCL 25 MG PO TABS: ORAL_TABLET | ORAL | 3 refills | Status: DC

## 2023-03-28 MED ORDER — AZELASTINE HCL 0.1 % NA SOLN: 2.0000 | Freq: Two times a day (BID) | NASAL | 5 refills | Status: DC | PRN

## 2023-03-28 MED ORDER — DULERA 50-5 MCG/ACT IN AERO: 2.0000 | INHALATION_SPRAY | Freq: Two times a day (BID) | RESPIRATORY_TRACT | 5 refills | Status: DC

## 2023-03-28 NOTE — Patient Instructions (Signed)
Mild persistent Asthma: - your lung testing today looked good, but you are asymptomatic currently and your history is suggestive of not controlled asthma due to need for oral steroids - Controller Inhaler: Start Dulera 50 mcg 2 puffs twice a day; This Should Be Used Everyday - Rinse mouth out after use - Rescue Inhaler: Albuterol (Proair/Ventolin) 2 puffs . Use  every 4-6 hours as needed for chest tightness, wheezing, or coughing.  Can also use 15 minutes prior to exercise if you have symptoms with activity. - Asthma is not controlled if:  - Symptoms are occurring >2 times a week OR  - >2 times a month nighttime awakenings  - You are requiring systemic steroids (prednisone/steroid injections) more than once per year  - Your require hospitalization for your asthma.  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  - Consider Astelin (azelastine) 1-2 sprays in each nostril twice a day as needed for nasal congestion/itchy nose - Continue Xyzal (Levocetirinze) 5mg  daily as needed  Allergic Conjunctivitis:  - Consider Allergy Eye drops-great options include Pataday (Olopatadine) or Zaditor (ketotifen) for eye symptoms daily as needed-both sold over the counter if not covered by insurance.  -Avoid eye drops that say red eye relief as they may contain medications that dry out your eyes.  Generalized pruritus: responsive to antihistamines. - this could be due to elevated  liver enzymes - continue management of NASH - continue xyzal daily and consider hydroxyzine 1-2 tablets nightly as needed for itching  GERD + possible EoE:  - continue management per GI - may consider dupixent in the future if biopsy proven EoE (will likely need to stop all medications and repeat a biopsy) - food allergy testing today to most common food allergens, as well as potatoes, chicken, pork, beef and pineapple were all negative (copy of results provided); if a food elimination diet is to be considered, would consider first dairy elimination  Follow up : 3 months, sooner if needed It was a pleasure meeting you in clinic today! Thank you for allowing me to participate in your care.  Tonny Bollman, MD [facility]  DUST MITE AVOIDANCE MEASURES:  There are three main measures that need and can be taken to avoid house dust mites:  Reduce accumulation of dust in general -reduce furniture, clothing, carpeting, books, stuffed animals, especially in bedroom  Separate yourself from the dust -use pillow and mattress encasements (can be found at stores such as [employer] or online) -avoid direct exposure to air condition flow -use a HEPA filter device, especially in the bedroom; you can also use a HEPA filter vacuum cleaner -wipe dust with a moist towel instead of a dry towel or broom when cleaning  Decrease mites and/or their secretions -wash clothing and linen and stuffed animals at highest temperature possible, at least every 2 weeks -stuffed animals can also be placed in a bag and put in a freezer overnight  Despite the above measures,  it is impossible to eliminate dust mites or their allergen completely from your home.  With the above measures the burden of mites in your home can be diminished, with the goal of minimizing your allergic symptoms.  Success will be reached only when implementing and using all means together.  General information about  allergy shots:  Allergy shots always are given starting at low (very dilute) doses and increase over time to more concentrated allergen. This is called build-up. There are two protocols that we offer for build-up.   Rush immunotherapy -- one day of shots lasting from 8-4pm, shots every 1 hour. Approximately half of the build-up process is completed in that one day. The following week, normal build-up is resumed, and this entails ~20 visits either weekly or twice weekly, then ~5 visits every other week, then every 3 weeks, then every 4 weeks. The regular build-up appointments are nurse visits the shots are administered, followed by required monitoring for 30 minutes.  Traditional build-up -- weekly or twice weekly visits for several months, then space out to every 2 weeks, then every 3 weeks, then every 4 weeks as above. At these appointments, the shots(s) are administered, followed by the required 30 minute monitoring period.   Either way is fine and both are equally effective. With the rush protocol, the advantage is that less time is spent here for shots overall, and the patient would reach maintenance dosing faster (which is when the clinical benefit starts to become apparent). Regardless of the build-up method you choose, the ultimate schedule is once monthly injections, which you then continue for 3-5 years for maximum sustained benefit.   IF we proceed with the rush protocol, there are premedications the day before and the day after the rush only (this includes antihistamines, steroids, and singulair). After the rush day, no prednisone or singulair is required, and we just recommend antihistamines.     Airborne Adult Perc - 03/28/23 1001     Time Antigen Placed 4098    Allergen Manufacturer Waynette Buttery    Location Back    Number of Test 59    1. Control-Buffer 50% Glycerol Negative    2. Control-Histamine 1 mg/ml 3+    3. Albumin saline Negative    4. Bahia Negative    5. French [place] Territ[place] Negative     6. Johnson Negative    7. Kentucky Blue Negative    8. Meadow Fescue Negative    9. Perennial Rye Negative    10. Sweet Vernal Negative    11. Timothy Negative    12. Cocklebur Negative    13. Burweed Marshelder Negative    14. Ragweed, short Negative    15. Ragweed, Giant Negative    16. Plantain,  English Negative    17. Lamb's Quarters Negative    18. Sheep Sorrell Negative    19. Rough Pigweed Negative    20. Marsh Elder, Rough Negative    21. Mugwort, Common Negative    22. Ash mix Negative    23. Birch mix Negative    24. Beech American Negative    25. Box, Elder Negative    26. Cedar, red Negative    27. Cottonwood, Guinea-[place]issau Negative    28. Elm mix Negative    29. Hickory Negative    30. Maple mix Negative    31. Oak, [place] mix Negative    32. Pecan Pollen Negative    33. Pine mix Negative    35. Walnut, Black Pollen Negative  36. Alternaria alternata Negative    37. Cladosporium Herbarum Negative    38. Aspergillus mix Negative    39. Penicillium mix Negative    40. Bipolaris sorokiniana (Helminthosporium) Negative    41. Drechslera spicifera (Curvularia) Negative    42. Mucor plumbeus Negative    43. Fusarium moniliforme Negative    44. Aureobasidium pullulans (pullulara) Negative    45. Rhizopus oryzae Negative    46. Botrytis cinera Negative    47. Epicoccum nigrum Negative    48. Phoma betae Negative    49. Candida Albicans Negative    50. Trichophyton mentagrophytes Negative    51. Mite, D Farinae  5,000 AU/ml 2+    52. Mite, D Pteronyssinus  5,000 AU/ml 2+    53. Cat Hair 10,000 BAU/ml Negative    54.  Dog Epithelia Negative    55. Mixed Feathers Negative    56. Horse Epithelia Negative    57. Cockroach, German Negative    58. Mouse Negative    59. Tobacco Leaf Negative             Intradermal - 03/28/23 1003     Time Antigen Placed 1004    Allergen Manufacturer Waynette Buttery    Location Back    Control Negative    [place] Territ[place] Negative    Johnson  Negative    7 Grass Negative    Ragweed mix Negative    Weed mix Negative    Tree mix Negative    Mold 1 Negative    Mold 2 Negative    Mold 3 Negative    Mold 4 3+    Cat Negative    Dog Negative    Cockroach Negative             Food Adult Perc - 03/28/23 1000     Time Antigen Placed 1610    Allergen Manufacturer Waynette Buttery    Location Back     Control-buffer 50% Glycerol Negative    Control-Histamine 1 mg/ml 3+    1. Peanut Negative    2. Soybean Negative    3. Wheat Negative    4. Sesame Negative    5. Milk, cow Negative    6. Egg White, Chicken Negative    7. Casein Negative    8. Shellfish Mix Negative    9. Fish Mix Negative    10. Cashew Negative    37. Pork Negative    38. [place] Meat Negative    39. Chicken Meat Negative    43. White Potato Negative    63. Pineapple Negative

## 2023-03-29 ENCOUNTER — Other Ambulatory Visit (INDEPENDENT_AMBULATORY_CARE_PROVIDER_SITE_OTHER): Payer: BC Managed Care – PPO

## 2023-03-29 ENCOUNTER — Encounter: Payer: Self-pay | Admitting: Internal Medicine

## 2023-03-29 ENCOUNTER — Ambulatory Visit: Payer: BC Managed Care – PPO | Admitting: Internal Medicine

## 2023-03-29 VITALS — BP 118/68 | HR 85 | Ht 65.0 in | Wt 192.0 lb

## 2023-03-29 DIAGNOSIS — K200 Eosinophilic esophagitis: Secondary | ICD-10-CM

## 2023-03-29 DIAGNOSIS — K760 Fatty (change of) liver, not elsewhere classified: Secondary | ICD-10-CM

## 2023-03-29 DIAGNOSIS — Z86010 Personal history of colonic polyps: Secondary | ICD-10-CM | POA: Diagnosis not present

## 2023-03-29 DIAGNOSIS — K2100 Gastro-esophageal reflux disease with esophagitis, without bleeding: Secondary | ICD-10-CM

## 2023-03-29 DIAGNOSIS — K7581 Nonalcoholic steatohepatitis (NASH): Secondary | ICD-10-CM

## 2023-03-29 LAB — COMPREHENSIVE METABOLIC PANEL
ALT: 32 U/L (ref 0–35)
AST: 28 U/L (ref 0–37)
Albumin: 4.1 g/dL (ref 3.5–5.2)
Alkaline Phosphatase: 126 U/L — ABNORMAL HIGH (ref 39–117)
BUN: 14 mg/dL (ref 6–23)
CO2: 31 mEq/L (ref 19–32)
Calcium: 9.7 mg/dL (ref 8.4–10.5)
Chloride: 101 mEq/L (ref 96–112)
Creatinine, Ser: 0.79 mg/dL (ref 0.40–1.20)
GFR: 81.77 mL/min (ref >60.00)
Glucose, Bld: 113 mg/dL — ABNORMAL HIGH (ref 70–99)
Potassium: 3.7 mEq/L (ref 3.5–5.1)
Sodium: 141 mEq/L (ref 135–145)
Total Bilirubin: 0.5 mg/dL (ref 0.2–1.2)
Total Protein: 7.6 g/dL (ref 6.0–8.3)

## 2023-03-29 LAB — PROTIME-INR
INR: 1 ratio (ref 0.8–1.0)
Prothrombin Time: 10.5 s (ref 9.6–13.1)

## 2023-03-29 MED ORDER — VOQUEZNA 10 MG PO TABS: 1.0000 | ORAL_TABLET | Freq: Every day | ORAL | 1 refills | Status: DC

## 2023-03-29 MED ORDER — VOQUEZNA 20 MG PO TABS: 1.0000 | ORAL_TABLET | Freq: Every day | ORAL | 1 refills | Status: DC

## 2023-03-29 NOTE — Progress Notes (Signed)
Subjective:    Patient ID: Carla Morales, female    DOB: 1961/10/12, 62 y.o.   MRN: [id number]  HPI Carla Morales is a 62 year old female with a history of GERD and esophagitis/EoE, MASH, history of adenomatous colon polyps, hyperlipidemia, obesity, who is here for follow-up.  She is here alone today and I last saw her on 01/09/2023.  After her last visit she continued Dexilant but took 8 weeks of [place].  She thought that this probably helped her swallowing and she has had "less choking" since using this medication.  However on the whole she just generally felt less well on this medication.  She had throat and mouth irritation in her throat and mouth felt "raw".  This made certain liquid and food considerably burning when she would ingest them.  Such as sodas or citrus.  She has tried to reduce citrus and sodas and also avoiding spicy foods.  She is having some heartburn despite the Dexilant 60 mg daily.  She is seen by allergy and asthma recently and they feel like her asthma is probably undermedicated.  They have prescribed a new inhaler.  They also mention Dupixent.  She was also having fairly diffuse itching which resolved with Xyzal.  She has been taking the vitamin E 1000 IU every other day because she could not find the 800 IU dose.   Review of Systems As per HPI, otherwise negative  Current Medications, Allergies, Past Medical History, Past Surgical History, Family History and Social History were reviewed in [employer] record.    Objective:   Physical Exam BP 118/68   Pulse 85   Ht 5\' 5"  (1.651 m)   Wt 192 lb (87.1 kg)   LMP 07/24/2010   BMI 31.95 kg/m  Gen: awake, alert, NAD HEENT: anicteric  Neuro: nonfocal  CBC    Component Value Date/Time   WBC 7.5 07/13/2022 1125   WBC 7.6 03/24/2015 0857   WBC 15.0 (H) 08/10/2010 0520   RBC 4.62 07/13/2022 1125   RBC 4.60 03/24/2015 0857   RBC 3.80 (L) 08/10/2010 0520   HGB 13.7 07/13/2022 1125    HCT 41.1 07/13/2022 1125   PLT 279 07/13/2022 1125   MCV 89 07/13/2022 1125   MCH 29.7 07/13/2022 1125   MCH 28.3 03/24/2015 0857   MCH 31.1 08/10/2010 0520   MCHC 33.3 07/13/2022 1125   MCHC 31.3 (A) 03/24/2015 0857   MCHC 34.8 08/10/2010 0520   RDW 13.3 07/13/2022 1125   LYMPHSABS 2.4 07/13/2022 1125   MONOABS 1.0 08/10/2010 0520   EOSABS 0.2 07/13/2022 1125   BASOSABS 0.1 07/13/2022 1125         Assessment & Plan:  Carla Morales is a 62 year old female with a history of GERD and esophagitis, MASH, history of adenomatous colon polyps, hyperlipidemia, obesity, who is here for follow-up.    GERD with esophagitis/EoE --she very well may benefit from Dupixent but first I am going to try her on vonoprazan because she is still having breakthrough heartburn despite Dexilant therapy.  She has made significant dietary modifications as well to try to control symptoms.  The Eohilia led to irritation in her throat and mouth and therefore were going to leave her off of this therapy.  It is also not intended for long-term use. --Discontinue Eohilia --Discontinue Dexilant --Begin vonoprazan 20 mg daily x 8 weeks and then 10 mg as a maintenance dose; samples provided today --If still having breakthrough heartburn or trouble swallowing I  would recommend we repeat upper endoscopy for esophageal biopsy and move to initiate Dupixent  2. MASH --we discussed controlling metabolic risk factors.  I do think she should continue vitamin E 800 IU daily.  Continue to work on weight reduction over time with a goal of 19 pounds in the next 1 year which would be a 10% weight reduction. --CBC, CMP and INR today --Continue vitamin E 800 IU daily  3.  History of colon polyps -surveillance colonoscopy recommended December 2026  30 minutes total spent today including patient facing time, coordination of care, reviewing medical history/procedures/pertinent radiology studies, and documentation of the  encounter.

## 2023-03-29 NOTE — Patient Instructions (Addendum)
You have been scheduled for a follow up with Dr Rhea Belton on 07/18/23 at 9:30 am.  Your provider has requested that you go to the basement level for lab work before leaving today. Press "B" on the elevator. The lab is located at the first door on the left as you exit the elevator.  Continue Vitamin E 800 iu daily.  We have sent Voquenza to [facility].  After starting the Voquenza, you may discontinue Dexilant.  _______________________________________________________  If your blood pressure at your visit was 140/90 or greater, please contact your primary care physician to follow up on this.  _______________________________________________________  If you are age 31 or older, your body mass index should be between 23-30. Your Body mass index is 31.95 kg/m. If this is out of the aforementioned range listed, please consider follow up with your Primary Care Provider.  If you are age 25 or younger, your body mass index should be between 19-25. Your Body mass index is 31.95 kg/m. If this is out of the aformentioned range listed, please consider follow up with your Primary Care Provider.   ________________________________________________________  The [facility] providers would like to encourage you to use [facility] to communicate with providers for non-urgent requests or questions.  Due to long hold times on the telephone, sending your provider a message by [facility] may be a faster and more efficient way to get a response.  Please allow 48 business hours for a response.  Please remember that this is for non-urgent requests.  _______________________________________________________  Due to recent changes in healthcare laws, you may see the results of your imaging and laboratory studies on MyChart before your provider has had a chance to review them.  We understand that in some cases there may be results that are confusing or concerning to you. Not all laboratory results come back in the same time frame  and the provider may be waiting for multiple results in order to interpret others.  Please give [place] 48 hours in order for your provider to thoroughly review all the results before contacting the office for clarification of your results.

## 2023-04-04 DIAGNOSIS — M5412 Radiculopathy, cervical region: Secondary | ICD-10-CM | POA: Diagnosis not present

## 2023-04-04 DIAGNOSIS — M9902 Segmental and somatic dysfunction of thoracic region: Secondary | ICD-10-CM | POA: Diagnosis not present

## 2023-04-04 DIAGNOSIS — M9903 Segmental and somatic dysfunction of lumbar region: Secondary | ICD-10-CM | POA: Diagnosis not present

## 2023-04-04 DIAGNOSIS — M9901 Segmental and somatic dysfunction of cervical region: Secondary | ICD-10-CM | POA: Diagnosis not present

## 2023-04-17 ENCOUNTER — Ambulatory Visit: Payer: BC Managed Care – PPO | Admitting: Internal Medicine

## 2023-04-18 ENCOUNTER — Encounter: Payer: Self-pay | Admitting: Internal Medicine

## 2023-04-18 DIAGNOSIS — M9901 Segmental and somatic dysfunction of cervical region: Secondary | ICD-10-CM | POA: Diagnosis not present

## 2023-04-18 DIAGNOSIS — M9902 Segmental and somatic dysfunction of thoracic region: Secondary | ICD-10-CM | POA: Diagnosis not present

## 2023-04-18 DIAGNOSIS — M9903 Segmental and somatic dysfunction of lumbar region: Secondary | ICD-10-CM | POA: Diagnosis not present

## 2023-04-18 DIAGNOSIS — M5412 Radiculopathy, cervical region: Secondary | ICD-10-CM | POA: Diagnosis not present

## 2023-04-19 ENCOUNTER — Other Ambulatory Visit: Payer: Self-pay

## 2023-04-19 MED ORDER — IPRATROPIUM BROMIDE 0.03 % NA SOLN: NASAL | 5 refills | Status: DC

## 2023-04-19 NOTE — Telephone Encounter (Signed)
I would recommend switching her to [facility] 100 mcg 2 puffs BID-use with spacer and rinse mouth after use. And also have her start Atrovent (ipatropium) nasal spray 1-2 sprays up to two times daily for drainage.  If not better by next week, please have her schedule a follow-up. Thank you!

## 2023-04-21 ENCOUNTER — Ambulatory Visit
Admission: EM | Admit: 2023-04-21 | Discharge: 2023-04-21 | Disposition: A | Discharge status: Home / Self Care | Payer: BC Managed Care – PPO | Attending: Urgent Care | Admitting: Urgent Care

## 2023-04-21 ENCOUNTER — Other Ambulatory Visit: Payer: Self-pay

## 2023-04-21 ENCOUNTER — Ambulatory Visit (INDEPENDENT_AMBULATORY_CARE_PROVIDER_SITE_OTHER): Payer: BC Managed Care – PPO

## 2023-04-21 DIAGNOSIS — J3089 Other allergic rhinitis: Secondary | ICD-10-CM

## 2023-04-21 DIAGNOSIS — U099 Post covid-19 condition, unspecified: Secondary | ICD-10-CM | POA: Diagnosis not present

## 2023-04-21 DIAGNOSIS — R053 Chronic cough: Secondary | ICD-10-CM | POA: Diagnosis not present

## 2023-04-21 DIAGNOSIS — J209 Acute bronchitis, unspecified: Secondary | ICD-10-CM | POA: Diagnosis not present

## 2023-04-21 MED ORDER — MONTELUKAST SODIUM 10 MG PO TABS: 10.0000 mg | ORAL_TABLET | Freq: Every day | ORAL | 0 refills | Status: DC

## 2023-04-21 MED ORDER — HYDROCODONE BIT-HOMATROP MBR 5-1.5 MG/5ML PO SOLN: 5.0000 mL | Freq: Four times a day (QID) | ORAL | 0 refills | Status: DC | PRN

## 2023-04-21 NOTE — Discharge Instructions (Addendum)
Your chest xray is normal.  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 I am questioning if this is the cause for your symptoms.  Please start taking montelukast every night before bed.  Please discuss possibly getting a nebulizer machine and using this in lieu of your inhaler when you have exacerbation such as this.  Please use the cough suppressant up to twice daily, but preferred at night as it may make you drowsy. This should also help with the pain.  Keep your appointment with your primary next week.

## 2023-04-21 NOTE — ED Triage Notes (Signed)
Pt c/o cough and congestion since last Sunday. Cough worse at night. Hx of asthma. Reached out to PCP via MyChart. Instructed to double her inhalers. Hx of bronchitis.

## 2023-04-21 NOTE — ED Provider Notes (Signed)
[facility]    CSN: [id number] Arrival date & time: 04/21/23  1223      History   Chief Complaint Chief Complaint  Patient presents with   Cough   Nasal Congestion    HPI [name] is a 62 y.o. female.   Pleasant 62 year old female presents today primarily due to concerns of cough.  She states it started worsening significantly this past Sunday.  She states however she has actually had a chronic cough since 2022 when she had a severe COVID-19 infection. She also has had globus sensation and hoarse voice since her covid infection in 2022 as well.  Patient has been following with an allergist recently for her allergy and asthma, and was told to increase her Dulera and to double up on her inhalers.  She has done this with no significant improvement to her cough.  She states now she is having pain in her lower back radiating around underneath her breast bilaterally.  She also states several days ago she did have a fever of 101.4.   Cough   Past Medical History:  Diagnosis Date   Adenomatous colon polyp    Allergy    Arthritis    Asthma    COVID-19    Diverticulosis    Elevated alkaline phosphatase level    Elevated antinuclear antibody (ANA) level    Eosinophilic esophagitis    Esophageal dysmotility    Esophageal stenosis    Fatty liver    GERD (gastroesophageal reflux disease)    Hyperlipidemia    Internal hemorrhoids    Migraine    Pinched nerve in neck    Schatzki's ring    Sinus congestion    Urinary incontinence     Patient Active Problem List   Diagnosis Date Noted   Pruritus 03/28/2023   Allergic conjunctivitis of both eyes 03/28/2023   Peripheral edema 07/11/2021   Asthma 01/07/2021   GERD (gastroesophageal reflux disease) 10/29/2019   Vitamin D deficiency 10/29/2019   Family history of breast cancer    Family history of uterine cancer    Family history of lung cancer    Family history of melanoma    Obesity (BMI 30-39.9)  03/29/2018   Perennial allergic rhinitis    Hyperlipidemia     Past Surgical History:  Procedure Laterality Date   ABDOMINAL HYSTERECTOMY     bladder dialation  1968   as a child   COLONOSCOPY     TONSILLECTOMY  1971   TOTAL VAGINAL HYSTERECTOMY  07/2010   menorrhagia, leiomyoma, prolapse   TUBAL LIGATION  1998    OB History     Gravida  1   Para  1   Term      Preterm      AB      Living  1      SAB      IAB      Ectopic      Multiple      Live Births               Home Medications    Prior to Admission medications   Medication Sig Start Date End Date Taking? Authorizing Provider  ipratropium (ATROVENT) 0.03 % nasal spray 1-2 sprays up to two times daily for drainage. 04/19/23   Verlee Monte, MD  albuterol (VENTOLIN HFA) 108 (90 Base) MCG/ACT inhaler USE 2 INHALATIONS EVERY 6 HOURS AS NEEDED FOR WHEEZING OR SHORTNESS OF BREATH Need office visit for  further refills 01/23/23   Jannifer Rodney A, FNP  atorvastatin (LIPITOR) 10 MG tablet Take 1 tablet (10 mg total) by mouth daily. 01/23/23   Jannifer Rodney A, FNP  azelastine (ASTELIN) 0.1 % nasal spray Place 2 sprays into both nostrils 2 (two) times daily as needed for rhinitis. Use in each nostril as directed 03/28/23   Verlee Monte, MD  azithromycin (ZITHROMAX) 250 MG tablet Take 250 mg by mouth once. 03/20/23   [provider]  baclofen (LIORESAL) 10 MG tablet Take 1 tablet (10 mg total) by mouth 3 (three) times daily. 01/30/23   Junie Spencer, FNP  Budesonide (EOHILIA) 2 MG/10ML SUSP Take 10 ml by mouth 2 times a day for 12 weeks Patient not taking: Reported on 03/29/2023 01/16/23   Beverley Fiedler, MD  fluconazole (DIFLUCAN) 100 MG tablet Take 2 tabs by mouth on day 1 then take 1 tablet daily until gone. 02/19/23   Pyrtle, Carie Caddy, MD  fluticasone (FLONASE) 50 MCG/ACT nasal spray Place 2 sprays into both nostrils daily. 01/23/23   Jannifer Rodney A, FNP  hydrochlorothiazide (HYDRODIURIL) 12.5 MG tablet  Take 1 tablet (12.5 mg total) by mouth daily. 01/23/23   Junie Spencer, FNP  hydrOXYzine (ATARAX) 25 MG tablet Take 1-2 tablets nightly as needed for itching. 03/28/23   Verlee Monte, MD  levocetirizine (XYZAL) 5 MG tablet Take 5 mg by mouth in the morning.    [provider]  meloxicam (MOBIC) 15 MG tablet Take 1 tablet (15 mg total) by mouth daily. 01/30/23   Hawks, Neysa Bonito A, FNP  Mometasone Furo-Formoterol Fum (DULERA) 50-5 MCG/ACT AERO Inhale 2 Inhalations into the lungs in the morning and at bedtime. Use with spacer, rinse mouth after use. 03/28/23   Verlee Monte, MD  nitroGLYCERIN (NITROSTAT) 0.4 MG SL tablet Place 0.4 mg under the tongue every 5 (five) minutes as needed. 01/23/23   [provider]  Vonoprazan Fumarate (VOQUEZNA) 10 MG TABS Take 1 tablet by mouth daily. 03/29/23   Pyrtle, Carie Caddy, MD  Vonoprazan Fumarate (VOQUEZNA) 20 MG TABS Take 1 tablet by mouth daily. X 8 weeks, then decrease to 10 mg once daily thereafter. 03/29/23 05/28/23  Pyrtle, Carie Caddy, MD    Family History Family History  Problem Relation Age of Onset   Allergic rhinitis Mother    Asthma Mother    Rheum arthritis Mother    Fibromyalgia Mother    Cancer Mother        breast, uterine, adenocarcinoma-right lung   Uterine cancer Mother        Sarcoma   Lung cancer Mother        Adenocarcinoma   Breast cancer Mother 31   Hypertension Father    Melanoma Father 3   Asthma Sister    Asthma Maternal Aunt    Diabetes Maternal Aunt    Cancer Maternal Aunt 44       GYN cancer - possibly vulvar?   Lung cancer Maternal Aunt    Diabetes Maternal Uncle    Appendicitis Maternal Uncle        d. 12   Diabetes Paternal Aunt    Breast cancer Paternal Aunt 34   Breast cancer Paternal Aunt 53   Uterine cancer Paternal Aunt 18   Breast cancer Paternal Aunt 31   Diabetes Paternal Uncle    Other Maternal Grandmother 30       childbirth   Heart disease Maternal Grandfather    Cancer  Paternal  Grandmother        unknown   Other Daughter        Phyllodes tumor   Other Nephew        Gilbert's Disease   Colon cancer Neg Hx    Esophageal cancer Neg Hx    Ulcerative colitis Neg Hx    Stomach cancer Neg Hx    Pancreatic cancer Neg Hx     Social History Social History   Tobacco Use   Smoking status: Never    Passive exposure: Never   Smokeless tobacco: Never  Vaping Use   Vaping Use: Never used  Substance Use Topics   Alcohol use: No    Alcohol/week: 0.0 standard drinks of alcohol   Drug use: No     Allergies   Other, Contrast media [iodinated contrast media], Haldol [haloperidol decanoate], Pravastatin, and Sulfa antibiotics   Review of Systems Review of Systems  Respiratory:  Positive for cough.      Physical Exam Triage Vital Signs ED Triage Vitals  Enc Vitals Group     BP 04/21/23 1233 119/79     Pulse Rate 04/21/23 1233 90     Resp 04/21/23 1233 17     Temp 04/21/23 1233 98.1 F (36.7 C)     Temp src --      SpO2 04/21/23 1233 98 %     Weight --      Height --      Head Circumference --      Peak Flow --      Pain Score 04/21/23 1234 0     Pain Loc --      Pain Edu? --      Excl. in GC? --    No data found.  Updated Vital Signs BP 119/79 (BP Location: Right Arm)   Pulse 90   Temp 98.1 F (36.7 C)   Resp 17   LMP 07/24/2010   SpO2 98%   Visual Acuity Right Eye Distance:   Left Eye Distance:   Bilateral Distance:    Right Eye Near:   Left Eye Near:    Bilateral Near:     Physical Exam   UC Treatments / Results  Labs (all labs ordered are listed, but only abnormal results are displayed) Labs Reviewed - No data to display  EKG   Radiology No results found.  Procedures Procedures (including critical care time)  Medications Ordered in UC Medications - No data to display  Initial Impression / Assessment and Plan / UC Course  I have reviewed the triage vital signs and the nursing notes.  Pertinent labs & imaging  results that were available during my care of the patient were reviewed by me and considered in my medical decision making (see chart for details).     *** Final Clinical Impressions(s) / UC Diagnoses   Final diagnoses:  None   Discharge Instructions   None    ED Prescriptions   None    PDMP not reviewed this encounter.

## 2023-04-26 ENCOUNTER — Ambulatory Visit (INDEPENDENT_AMBULATORY_CARE_PROVIDER_SITE_OTHER): Payer: BC Managed Care – PPO | Admitting: Family Medicine

## 2023-04-26 ENCOUNTER — Other Ambulatory Visit: Payer: Self-pay

## 2023-04-26 ENCOUNTER — Encounter: Payer: Self-pay | Admitting: Family Medicine

## 2023-04-26 DIAGNOSIS — J4521 Mild intermittent asthma with (acute) exacerbation: Secondary | ICD-10-CM

## 2023-04-26 DIAGNOSIS — J0110 Acute frontal sinusitis, unspecified: Secondary | ICD-10-CM

## 2023-04-26 DIAGNOSIS — J3089 Other allergic rhinitis: Secondary | ICD-10-CM | POA: Diagnosis not present

## 2023-04-26 DIAGNOSIS — H1013 Acute atopic conjunctivitis, bilateral: Secondary | ICD-10-CM | POA: Diagnosis not present

## 2023-04-26 DIAGNOSIS — K2100 Gastro-esophageal reflux disease with esophagitis, without bleeding: Secondary | ICD-10-CM

## 2023-04-26 DIAGNOSIS — L299 Pruritus, unspecified: Secondary | ICD-10-CM

## 2023-04-26 MED ORDER — PREDNISONE 10 MG PO TABS: ORAL_TABLET | ORAL | 0 refills | Status: DC

## 2023-04-26 MED ORDER — AMOXICILLIN-POT CLAVULANATE 875-125 MG PO TABS: 1.0000 | ORAL_TABLET | Freq: Two times a day (BID) | ORAL | 0 refills | Status: DC

## 2023-04-26 NOTE — Patient Instructions (Addendum)
Asthma with acute exacerbation Begin prednisone 10 mg tablets. Take 2 tablets twice a day for 3 days, then take 2 tablets once a day for 1 day, then take 1 tablet on the 5th day, then stop  Continue Dulera 50-4 puffs twice a day for the next 1-2 weeks or until cough and wheeze free. Pre-treat with albuterol Continue albuterol 2 puffs once every 4 hours as needed for cough or wheeze You may use albuterol 2 puffs 5 to 15 minutes before activity to decrease cough or wheeze  Acute bacterial sinusitis Begin Augmentin 875 mg twice a day for sinus infection Begin saline nasal rinses at least once a day Continue Flonase 2 sprays in each nostril once a day for nasal congestion Hold azelastine for about a week, then restart if needed Begin Mucinex [phone] mg twice a day to thin mucus. Increase fluid intake as tolerated.   Allergic rhinitis Continue allergen avoidance measures directed toward dust mite and mold as listed below Continue Xyzal 5 mg once a day as needed for runny nose or itch Continue Flonase 2 sprays in each nostril once a day as needed for stuffy nose Continue azelastine 2 sprays in each nostril up to twice a day as needed for runny nose Consider saline nasal rinses as needed for nasal symptoms. Use this before any medicated nasal sprays for best result  Allergic conjunctivitis Some over the counter eye drops include Pataday one drop in each eye once a day as needed for red, itchy eyes OR Zaditor one drop in each eye twice a day as needed for red itchy eyes. Avoid eye drops that say red eye relief as they may contain medications that dry out your eyes.   Pruritus Continue Xyzal 5 mg once a day as needed for itch Continue hydroxyzine as needed for nighttime itch  Call the clinic if this treatment plan is not working well for you  Follow up in the clinic in 2 weeks or sooner if needed.   Control of Dust Mite Allergen Dust mites play a major role in allergic asthma and rhinitis.  They occur in environments with high humidity wherever human skin is found. Dust mites absorb humidity from the atmosphere (ie, they do not drink) and feed on organic matter (including shed human and animal skin). Dust mites are a microscopic type of insect that you cannot see with the naked eye. High levels of dust mites have been detected from mattresses, pillows, carpets, upholstered furniture, bed covers, clothes, soft toys and any woven material. The principal allergen of the dust mite is found in its feces. A gram of dust may contain 1,000 mites and 250,000 fecal particles. Mite antigen is easily measured in the air during house cleaning activities. Dust mites do not bite and do not cause harm to humans, other than by triggering allergies/asthma.  Ways to decrease your exposure to dust mites in your home:  1. Encase mattresses, box springs and pillows with a mite-impermeable barrier or cover  2. Wash sheets, blankets and drapes weekly in hot water (130 F) with detergent and dry them in a dryer on the hot setting.  3. Have the room cleaned frequently with a vacuum cleaner and a damp dust-mop. For carpeting or rugs, vacuuming with a vacuum cleaner equipped with a high-efficiency particulate air (HEPA) filter. The dust mite allergic individual should not be in a room which is being cleaned and should wait 1 hour after cleaning before going into the room.  4. Do  not sleep on upholstered furniture (eg, couches).  5. If possible removing carpeting, upholstered furniture and drapery from the home is ideal. Horizontal blinds should be eliminated in the rooms where the person spends the most time (bedroom, study, television room). Washable vinyl, roller-type shades are optimal.  6. Remove all non-washable stuffed toys from the bedroom. Wash stuffed toys weekly like sheets and blankets above.  7. Reduce indoor humidity to less than 50%. Inexpensive humidity monitors can be purchased at most hardware  stores. Do not use a humidifier as can make the problem worse and are not recommended.  Control of Mold Allergen Mold and fungi can grow on a variety of surfaces provided certain temperature and moisture conditions exist.  Outdoor molds grow on plants, decaying vegetation and soil.  The major outdoor mold, Alternaria and Cladosporium, are found in very high numbers during hot and dry conditions.  Generally, a late Summer - Fall peak is seen for common outdoor fungal spores.  Rain will temporarily lower outdoor mold spore count, but counts rise rapidly when the rainy period ends.  The most important indoor molds are Aspergillus and Penicillium.  Dark, humid and poorly ventilated basements are ideal sites for mold growth.  The next most common sites of mold growth are the bathroom and the kitchen.  Outdoor [employer] Use air conditioning and keep windows closed Avoid exposure to decaying vegetation. Avoid leaf raking. Avoid grain handling. Consider wearing a face mask if working in moldy areas.  Indoor Mold Control Maintain humidity below 50%. Clean washable surfaces with 5% bleach solution. Remove sources e.g. Contaminated carpets.

## 2023-04-26 NOTE — Progress Notes (Signed)
RE: Carla Morales MRN: [id number] DOB: Apr 23, 1961 Date of Telemedicine Visit: 04/26/2023  Referring provider: Junie Spencer, FNP Primary care provider: Junie Spencer, FNP  Chief Complaint: Asthma, Follow-up, Cough (Patient stated, she been coughing a lot), Nasal Congestion, Wheezing, and Fever (She tested for COVID and Flu, last Thursday, it came out negative)   Telemedicine Follow Up Visit via Telephone: I connected with Carla Morales for a follow up on 04/27/23 by telephone and verified that I am speaking with the correct person using two identifiers.   I discussed the limitations, risks, security and privacy concerns of performing an evaluation and management service by telephone and the availability of in person appointments. I also discussed with the patient that there may be a patient responsible charge related to this service. The patient expressed understanding and agreed to proceed.  Patient is at home   Provider is at the office.  Visit start time: 943 Visit end time: 6 Insurance consent/check in by: [facility] consent and medical assistant/nurse: Patience  History of Present Illness: She is a 62 y.o. female, who is being followed for asthma, allergic rhinitis, allergic conjunctivitis, pruritus with increased liver enzymes, and reflux for which she follows up with her GI specialist. She had COVID in 2022. Marland Kitchen Her previous allergy office visit was on 03/28/2023 with  Dr. Maurine Minister . On 04/18/2023 she communicated with Dr. Maurine Minister via MyChart reporting uncontrollable cough which was disrupting sleep at which time she was prescribed Dulera 50 mcg 4 puffs twice a day for 2 weeks. Carla Morales reported back that she continued to cough and Dr. Maurine Minister recommended a change to [facility] 100-2 puffs twice a day and the addition of ipratropium nasal spray. She visited urgent care on 04/21/2023 for evaluation of cough and fever with Tmax 101.4. CXR normal. At that time, she added montelukast daily  and hydrocodone-homatropine cough syrup.   At today's visit, she reports that she began to develop a cough Sunday before last (11 days ago) and has also had intermittent fever.   Asthma is reported as poorly controlled with shortness of breath, wheeze originating from her throat, and constant cough producing yellow mucus. She reports the cough is worst while talking or in the heat. She continues montelukast 10 mg once a day, Dulera 50-4 puffs twice a day, and has been using albuterol 3-4 times a day with mild relief of symptoms.   Allergic rhinitis is reported as poorly controlled with nasal congestion, thick yellow-green nasal drainage, headache around her eyes. She reports that last week she had been experiencing sneezing and post nasal drainage. This week, she reports more thick mucus that is difficult to cough out. She continues montelukast daily, Xyzal daily, Flonase with excellent application technique daily, azelastine twice a day and ipratropium twice a day. She is not currently using nasal saline rinses.   Pruritis is reported as moderately well controlled with Xyzal daily and reports that she has not needed to take hydroxyzine at night recently.   Reflux is reported as moderately well controlled. She reports a frequent globus sensation. She continues to follow up Dr. Rhea Belton, GI specialist who switched over from Dexilant to Vonoprazan on 03/29/2023. Chart review indicates that he would like to try this medication before turning to repear EGD and Dupixent to control reflux with EoE.    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   Her current medications are listed in the chart.   Assessment and Plan: Carla Morales  is a 62 y.o. female with: Patient Instructions  Asthma with acute exacerbation Begin prednisone 10 mg tablets. Take 2 tablets twice a day for 3 days, then take 2 tablets once a day for 1 day, then take 1 tablet on the 5th day, then stop  Continue Dulera  50-4 puffs twice a day for the next 1-2 weeks or until cough and wheeze free. Pre-treat with albuterol Continue albuterol 2 puffs once every 4 hours as needed for cough or wheeze You may use albuterol 2 puffs 5 to 15 minutes before activity to decrease cough or wheeze  Acute bacterial sinusitis Begin Augmentin 875 mg twice a day for sinus infection Begin saline nasal rinses at least once a day Continue Flonase 2 sprays in each nostril once a day for nasal congestion Hold azelastine for about a week, then restart if needed Begin Mucinex [phone] mg twice a day to thin mucus. Increase fluid intake as tolerated.   Allergic rhinitis Continue allergen avoidance measures directed toward dust mite and mold as listed below Continue Xyzal 5 mg once a day as needed for runny nose or itch Continue Flonase 2 sprays in each nostril once a day as needed for stuffy nose Continue azelastine 2 sprays in each nostril up to twice a day as needed for runny nose Consider saline nasal rinses as needed for nasal symptoms. Use this before any medicated nasal sprays for best result  Allergic conjunctivitis Some over the counter eye drops include Pataday one drop in each eye once a day as needed for red, itchy eyes OR Zaditor one drop in each eye twice a day as needed for red itchy eyes. Avoid eye drops that say red eye relief as they may contain medications that dry out your eyes.   Pruritus Continue Xyzal 5 mg once a day as needed for itch Continue hydroxyzine as needed for nighttime itch  Call the clinic if this treatment plan is not working well for you  Follow up in the clinic in 2 weeks or sooner if needed.    Return in about 2 weeks (around 05/10/2023), or if symptoms worsen or Morales to improve.  Meds ordered this encounter  Medications   predniSONE (DELTASONE) 10 MG tablet    Sig: Begin prednisone 10 mg tablets. Take 2 tablets twice a day for 3 days, then take 2 tablets once a day for 1 day, then  take 1 tablet on the 5th day, then stop    Dispense:  15 tablet    Refill:  0   amoxicillin-clavulanate (AUGMENTIN) 875-125 MG tablet    Sig: Take 1 tablet by mouth 2 (two) times daily.    Dispense:  20 tablet    Refill:  0    Medication List:  Current Outpatient Medications  Medication Sig Dispense Refill   albuterol (VENTOLIN HFA) 108 (90 Base) MCG/ACT inhaler USE 2 INHALATIONS EVERY 6 HOURS AS NEEDED FOR WHEEZING OR SHORTNESS OF BREATH Need office visit for further refills 54 g 0   amoxicillin-clavulanate (AUGMENTIN) 875-125 MG tablet Take 1 tablet by mouth 2 (two) times daily. 20 tablet 0   atorvastatin (LIPITOR) 10 MG tablet Take 1 tablet (10 mg total) by mouth daily. 90 tablet 0   azelastine (ASTELIN) 0.1 % nasal spray Place 2 sprays into both nostrils 2 (two) times daily as needed for rhinitis. Use in each nostril as directed 30 mL 5   fluticasone (FLONASE) 50 MCG/ACT nasal spray Place 2 sprays into both nostrils daily. 16  g 6   hydrochlorothiazide (HYDRODIURIL) 12.5 MG tablet Take 1 tablet (12.5 mg total) by mouth daily. 90 tablet 0   HYDROcodone bit-homatropine (HYCODAN) 5-1.5 MG/5ML syrup Take 5 mLs by mouth every 6 (six) hours as needed for cough. 120 mL 0   hydrOXYzine (ATARAX) 25 MG tablet Take 1-2 tablets nightly as needed for itching. 60 tablet 3   ipratropium (ATROVENT) 0.03 % nasal spray 1-2 sprays up to two times daily for drainage. 30 mL 5   levocetirizine (XYZAL) 5 MG tablet Take 5 mg by mouth in the morning.     meloxicam (MOBIC) 15 MG tablet Take 1 tablet (15 mg total) by mouth daily. 90 tablet 0   Mometasone Furo-Formoterol Fum (DULERA) 50-5 MCG/ACT AERO Inhale 2 Inhalations into the lungs in the morning and at bedtime. Use with spacer, rinse mouth after use. 1 each 5   montelukast (SINGULAIR) 10 MG tablet Take 1 tablet (10 mg total) by mouth at bedtime. 30 tablet 0   nitroGLYCERIN (NITROSTAT) 0.4 MG SL tablet Place 0.4 mg under the tongue every 5 (five) minutes as  needed.     predniSONE (DELTASONE) 10 MG tablet Begin prednisone 10 mg tablets. Take 2 tablets twice a day for 3 days, then take 2 tablets once a day for 1 day, then take 1 tablet on the 5th day, then stop 15 tablet 0   Vonoprazan Fumarate (VOQUEZNA) 10 MG TABS Take 1 tablet by mouth daily. 30 tablet 1   Vonoprazan Fumarate (VOQUEZNA) 20 MG TABS Take 1 tablet by mouth daily. X 8 weeks, then decrease to 10 mg once daily thereafter. 30 tablet 1   baclofen (LIORESAL) 10 MG tablet Take 1 tablet (10 mg total) by mouth 3 (three) times daily. (Patient not taking: Reported on 04/26/2023) 60 each 0   fluconazole (DIFLUCAN) 100 MG tablet Take 2 tabs by mouth on day 1 then take 1 tablet daily until gone. (Patient not taking: Reported on 04/26/2023) 15 tablet 0   No current facility-administered medications for this visit.   Allergies: Allergies  Allergen Reactions   Other Swelling    Contrast dye   Contrast Media [Iodinated Contrast Media]    Haldol [Haloperidol Decanoate]     anxiety   Pravastatin Other (See Comments)    myalgias   Sulfa Antibiotics Rash   I reviewed her past medical history, social history, family history, and environmental history and no significant changes have been reported from previous visit on 03/28/2023.   Objective: Physical Exam Not obtained as encounter was done via telephone.   Previous notes and tests were reviewed.  I discussed the assessment and treatment plan with the patient. The patient was provided an opportunity to ask questions and all were answered. The patient agreed with the plan and demonstrated an understanding of the instructions.   The patient was advised to call back or seek an in-person evaluation if the symptoms worsen or if the condition fails to improve as anticipated.  I provided 30 minutes of non-face-to-face time during this encounter.  It was my pleasure to participate in Mackey Bise's care today. Please feel free to contact me with any  questions or concerns.   Sincerely,  Thermon Leyland, FNP

## 2023-04-27 ENCOUNTER — Encounter: Payer: Self-pay | Admitting: Family Medicine

## 2023-04-27 ENCOUNTER — Other Ambulatory Visit: Payer: Self-pay | Admitting: *Deleted

## 2023-04-27 ENCOUNTER — Telehealth: Payer: Self-pay | Admitting: *Deleted

## 2023-04-27 DIAGNOSIS — J0110 Acute frontal sinusitis, unspecified: Secondary | ICD-10-CM | POA: Insufficient documentation

## 2023-04-27 MED ORDER — DULERA 100-5 MCG/ACT IN AERO: 2.0000 | INHALATION_SPRAY | Freq: Two times a day (BID) | RESPIRATORY_TRACT | 5 refills | Status: DC

## 2023-04-27 MED ORDER — MOMETASONE FURO-FORMOTEROL FUM 100-5 MCG/ACT IN AERO: 2.0000 | INHALATION_SPRAY | Freq: Two times a day (BID) | RESPIRATORY_TRACT | 2 refills | Status: DC

## 2023-04-27 NOTE — Telephone Encounter (Signed)
My apologizes, called and left a voicemail asking for patient to return call to inform.

## 2023-04-27 NOTE — Addendum Note (Signed)
Addended by: Robet Leu A on: 04/27/2023 04:47 PM   Modules accepted: Orders

## 2023-04-27 NOTE — Telephone Encounter (Signed)
New prescription has been sent in. Called and left a voicemail asking for patient to return call to inform, patient verbalized understanding.

## 2023-04-27 NOTE — Telephone Encounter (Signed)
-----   Message from Hetty Blend, FNP sent at 04/27/2023  4:09 PM EDT ----- Can you please order a Dulera 100/5 -2 puffs twice a day for this patient. This will replace Dulera 50/5 -4 puffs twice a day. While she is needing to use this medication more frequently I want her to get a higher dose of the inhaled corticosteroid and want her to remain at the same level of long acting bets concentration. Thank you. Please let the patient know of the change. Thank you

## 2023-05-01 NOTE — Telephone Encounter (Signed)
All of these are possibilities-is she taking anything for GERD? If not, have her start omeprazole 40 mg daily - take 30 minutes prior to breakfast/meals. Continue dulera until higher dose available.

## 2023-05-01 NOTE — Telephone Encounter (Signed)
Patient called back and stated that she is taking Voquezana 20mg  daily for her GERD.

## 2023-05-01 NOTE — Telephone Encounter (Signed)
I would have her reach out to whoever is managing her GERD to discuss symptoms as well.  I will see her in clinic next week.

## 2023-05-01 NOTE — Telephone Encounter (Signed)
Called and left a voicemail asking for return call to discuss. I didn't see any current GERD medications in her medication list.

## 2023-05-01 NOTE — Telephone Encounter (Signed)
Called patient and advised. Patient verbalized understanding.  

## 2023-05-01 NOTE — Telephone Encounter (Signed)
Called and spoke with the patient and she stated that insurance is giving her a hard time picking up the stronger Dulera but they are working on it. She states that she is still using 4 puffs of Dulera 50. She states that recently she has been having issues with having shortness of breath while trying to eat and wonders  if it may be her GERD. She states that it's not as bad this morning but it is still there some. I did schedule her for a follow up visit with Dr. Maurine Minister next week. She is also wondering if she may have long COVID and maybe have [place] nerve neuropathy.

## 2023-05-02 ENCOUNTER — Encounter: Payer: Self-pay | Admitting: Internal Medicine

## 2023-05-08 NOTE — Progress Notes (Unsigned)
FOLLOW UP Date of Service/Encounter:  05/09/23   Subjective:  Carla Morales (DOB: 1961/09/22) is a 62 y.o. female [facility] in re-evaluation of the following: asthma, allergic rhinitis, pruritus History obtained from: chart review and patient.  For Review, LV was on 04/26/23  with Thermon Leyland, FNP seen for acute visit for asthma exacerbation . See below for summary of history and diagnostics.  Therapeutic plans/changes recommended: prednisone, continue dulera 50 4 puffs BID x 2 weeks, Augmentin 875 mg BID, add saline rinses. ----------------------------------------------------- Pertinent History/Diagnostics:  Asthma: Diagnosed as an adult. Sx: SOB, moderate exercise intolerance. 2-3 courses systemic steroids in 2023-2024. No prior hospitalizations. Triggers: exercise and respiratory illness, symptoms worsened after COVID infections in 2020 and 2021. No smoke exposure. QVAR gave her oral thrush.  -  AEC (07/13/22): 200 -  CXR on (2022): normal - normal spirometry (03/28/23): ratio 0.86, 92% FEV1 Allergic Rhinitis:  hoarse voice, feeling something is stuck in her throat, eyes watery, feel blurry like a film over them, runny nose, generalized pruritus Occurs year-round with seasonal flares-spring and fall Has reflux. Follows with GI on dexilant recently switched to vonoprazan (03/29/23), scope concerning for EoE without histologic diagnosis. Chokes on dry meats. Has had 2 esophageal dilations. S/p tonsillectomy. - SPT environmental panel (03/28/23): skin testing borderline to dust mites, intradermals positive to minor molds  - SPT most common food allergens + potatoes, chicken, pork, beef and pineapple (03/28/23) negative Generalized pruritus:  NASH present, no rash unless scratches.  --------------------------------------------------- Today presents for follow-up. She did have a CXR after her last visit which was normal.  She has  since started singulair and a cough syrup. She did feel better after starting augmentin and prednisone at last visit. She has not had another flare since that happened.  She continues feeling hoarse and coughing some.  She has continued on Dulera 100 mcg 2 puffs twice a day. She has followed up with GI.  She is back on dexilant.  He felt the next step would be another biopsy and trying to get approved for dupixent. Her last biopsy was in December and she was on dexilant at that time and her eosinophil count was not above EOE threshold. She has had thrush since I last saw her, had rawness in her throat/mouth.  She had a yeast infection.  She is using mouth wash and brushes teeth after using her dulera. She uses a spacer. She had a fluconazole tablet at home, and took one which resolved symptoms. None since. She is using albuterol twice daily.   She has had symptoms of shortness of breath while eating, but this doesn't happen often.   That was also during a time when she was coughing a lot.   Allergies as of 05/09/2023       Reactions   Other Swelling   Contrast dye   Contrast Media [iodinated Contrast Media]    Haldol [haloperidol Decanoate]    anxiety   Pravastatin Other (See Comments)   myalgias   Sulfa Antibiotics Rash        Medication List        Accurate as of May 09, 2023  1:23 PM. If you have any questions, ask your nurse or doctor.          STOP taking these medications    amoxicillin-clavulanate 875-125 MG tablet Commonly known as: AUGMENTIN Stopped by: Verlee Monte, MD   predniSONE 10 MG tablet  Commonly known as: DELTASONE Stopped by: Verlee Monte, MD       TAKE these medications    albuterol 108 (90 Base) MCG/ACT inhaler Commonly known as: Ventolin HFA USE 2 INHALATIONS EVERY 6 HOURS AS NEEDED FOR WHEEZING OR SHORTNESS OF BREATH Need office visit for further refills   atorvastatin 10 MG tablet Commonly known as: LIPITOR Take 1 tablet (10 mg total)  by mouth daily.   azelastine 0.1 % nasal spray Commonly known as: ASTELIN Place 2 sprays into both nostrils 2 (two) times daily as needed for rhinitis. Use in each nostril as directed   baclofen 10 MG tablet Commonly known as: LIORESAL Take 1 tablet (10 mg total) by mouth 3 (three) times daily.   Dulera 50-5 MCG/ACT Aero Generic drug: Mometasone Furo-Formoterol Fum Inhale 2 Inhalations into the lungs in the morning and at bedtime. Use with spacer, rinse mouth after use.   mometasone-formoterol 100-5 MCG/ACT Aero Commonly known as: DULERA Inhale 2 puffs into the lungs in the morning and at bedtime.   Dulera 100-5 MCG/ACT Aero Generic drug: mometasone-formoterol Inhale 2 puffs into the lungs 2 (two) times daily.   fluconazole 100 MG tablet Commonly known as: DIFLUCAN Take 2 tabs by mouth on day 1 then take 1 tablet daily until gone.   fluticasone 50 MCG/ACT nasal spray Commonly known as: FLONASE Place 2 sprays into both nostrils daily.   hydrochlorothiazide 12.5 MG tablet Commonly known as: HYDRODIURIL Take 1 tablet (12.5 mg total) by mouth daily.   HYDROcodone bit-homatropine 5-1.5 MG/5ML syrup Commonly known as: HYCODAN Take 5 mLs by mouth every 6 (six) hours as needed for cough.   hydrOXYzine 25 MG tablet Commonly known as: ATARAX Take 1-2 tablets nightly as needed for itching.   ipratropium 0.03 % nasal spray Commonly known as: ATROVENT 1-2 sprays up to two times daily for drainage.   levocetirizine 5 MG tablet Commonly known as: XYZAL Take 5 mg by mouth in the morning.   meloxicam 15 MG tablet Commonly known as: MOBIC Take 1 tablet (15 mg total) by mouth daily.   montelukast 10 MG tablet Commonly known as: Singulair Take 1 tablet (10 mg total) by mouth at bedtime.   nitroGLYCERIN 0.4 MG SL tablet Commonly known as: NITROSTAT Place 0.4 mg under the tongue every 5 (five) minutes as needed.   VITAMIN E PO Take 800 Int'l Units/day by mouth daily.    Voquezna 20 MG Tabs Generic drug: Vonoprazan Fumarate Take 1 tablet by mouth daily. X 8 weeks, then decrease to 10 mg once daily thereafter.   Voquezna 10 MG Tabs Generic drug: Vonoprazan Fumarate Take 1 tablet by mouth daily.       Past Medical History:  Diagnosis Date   Adenomatous colon polyp    Allergy    Arthritis    Asthma    COVID-19    Diverticulosis    Elevated alkaline phosphatase level    Elevated antinuclear antibody (ANA) level    Eosinophilic esophagitis    Esophageal dysmotility    Esophageal stenosis    Fatty liver    GERD (gastroesophageal reflux disease)    Hyperlipidemia    Internal hemorrhoids    Migraine    Pinched nerve in neck    Schatzki's ring    Sinus congestion    Urinary incontinence    Past Surgical History:  Procedure Laterality Date   ABDOMINAL HYSTERECTOMY     bladder dialation  1968   as a child  COLONOSCOPY     TONSILLECTOMY  1971   TOTAL VAGINAL HYSTERECTOMY  07/2010   menorrhagia, leiomyoma, prolapse   TUBAL LIGATION  1998   Otherwise, there have been no changes to her past medical history, surgical history, family history, or social history.  ROS: All others negative except as noted per HPI.   Objective:  BP 118/70   Pulse 86   Temp 98.1 F (36.7 C) (Temporal)   Resp 20   Wt 192 lb 11.2 oz (87.4 kg)   LMP 07/24/2010   SpO2 98%   BMI 32.07 kg/m  Body mass index is 32.07 kg/m. Physical Exam: General Appearance:  Alert, cooperative, no distress, appears stated age  Head:  Normocephalic, without obvious abnormality, atraumatic  Eyes:  Conjunctiva clear, EOM's intact  Nose: Nares normal, hypertrophic turbinates and normal mucosa  Throat: Lips, tongue normal; teeth and gums normal, normal posterior oropharynx and + cobblestoning  Neck: Supple, symmetrical  Lungs:   clear to auscultation bilaterally, Respirations unlabored, no coughing  Heart:  regular rate and rhythm and no murmur, Appears well perfused   Extremities: No edema  Skin: Skin color, texture, turgor normal and no rashes or lesions on visualized portions of skin  Neurologic: No gross deficits   Spirometry:  Tracings reviewed. Her effort: Good reproducible efforts. FVC: 2.59L FEV1: 2.22L, 83% predicted FEV1/FVC ratio: 0.86 Interpretation: Spirometry consistent with normal pattern.  Please see scanned spirometry results for details.  Assessment/Plan   Asthma - not controlled Increase Dulera 200 mcg 2 puffs twice daily.  Continue albuterol 2 puffs once every 4 hours as needed for cough or wheeze You may use albuterol 2 puffs 5 to 15 minutes before activity to decrease cough or wheeze  Will send in fluconazole to have on hand in case of thrust Continue to rinse and brush teeth after using inhaler. Use with spacer.   Allergic rhinitis - partially controlled Continue allergen avoidance measures directed toward dust mite and mold as listed below Consider allergy injections. Information provided today Continue Xyzal 5 mg once a day as needed for runny nose or itch Continue Flonase 2 sprays in each nostril once a day as needed for stuffy nose Continue azelastine 2 sprays in each nostril up to twice a day as needed for runny nose Consider saline nasal rinses as needed for nasal symptoms. Use this before any medicated nasal sprays for best result  Allergic conjunctivitis - partially controlled Some over the counter eye drops include Pataday one drop in each eye once a day as needed for red, itchy eyes OR Zaditor one drop in each eye twice a day as needed for red itchy eyes. Avoid eye drops that say red eye relief as they may contain medications that dry out your eyes.   Pruritus - controlled Continue Xyzal 5 mg once a day as needed for itch Continue hydroxyzine as needed for nighttime itch  Concern for EoE - not controlled - continue medications as prescribed by GI - consider repeat biopsy- ask about holding dexilant prior to  biopsy  Call the clinic if this treatment plan is not working well for you  Follow up in the clinic in 3 months, sooner if needed. Call us if you decide on allergy testing-let us know if traditional or rush protocol after discussing with insurance.  Tonny Bollman, MD  [facility]

## 2023-05-09 ENCOUNTER — Encounter: Payer: Self-pay | Admitting: Internal Medicine

## 2023-05-09 ENCOUNTER — Other Ambulatory Visit: Payer: Self-pay

## 2023-05-09 ENCOUNTER — Ambulatory Visit: Payer: BC Managed Care – PPO | Admitting: Internal Medicine

## 2023-05-09 VITALS — BP 118/70 | HR 86 | Temp 98.1°F | Resp 20 | Wt 192.7 lb

## 2023-05-09 DIAGNOSIS — J3089 Other allergic rhinitis: Secondary | ICD-10-CM | POA: Diagnosis not present

## 2023-05-09 DIAGNOSIS — J4530 Mild persistent asthma, uncomplicated: Secondary | ICD-10-CM

## 2023-05-09 DIAGNOSIS — H1013 Acute atopic conjunctivitis, bilateral: Secondary | ICD-10-CM

## 2023-05-09 DIAGNOSIS — K2100 Gastro-esophageal reflux disease with esophagitis, without bleeding: Secondary | ICD-10-CM | POA: Diagnosis not present

## 2023-05-09 MED ORDER — FLUCONAZOLE 100 MG PO TABS: ORAL_TABLET | ORAL | 0 refills | Status: DC

## 2023-05-09 MED ORDER — DULERA 200-5 MCG/ACT IN AERO: 2.0000 | INHALATION_SPRAY | Freq: Two times a day (BID) | RESPIRATORY_TRACT | 5 refills | Status: DC

## 2023-05-09 NOTE — Patient Instructions (Addendum)
Asthma  Increase Dulera 200 mcg 2 puffs twice daily.  Continue albuterol 2 puffs once every 4 hours as needed for cough or wheeze You may use albuterol 2 puffs 5 to 15 minutes before activity to decrease cough or wheeze  Will send in fluconazole to have on hand in case of thrust Continue to rinse and brush teeth after using inhaler. Use with spacer.   Allergic rhinitis Continue allergen avoidance measures directed toward dust mite and mold as listed below Consider allergy injections. Information provided today Continue Xyzal 5 mg once a day as needed for runny nose or itch Continue Flonase 2 sprays in each nostril once a day as needed for stuffy nose Continue azelastine 2 sprays in each nostril up to twice a day as needed for runny nose Consider saline nasal rinses as needed for nasal symptoms. Use this before any medicated nasal sprays for best result  Allergic conjunctivitis Some over the counter eye drops include Pataday one drop in each eye once a day as needed for red, itchy eyes OR Zaditor one drop in each eye twice a day as needed for red itchy eyes. Avoid eye drops that say red eye relief as they may contain medications that dry out your eyes.   Pruritus Continue Xyzal 5 mg once a day as needed for itch Continue hydroxyzine as needed for nighttime itch  Concern for EoE - continue medications as prescribed by GI - consider repeat biopsy- ask about holding dexilant prior to biopsy   Call the clinic if this treatment plan is not working well for you  Follow up in the clinic in 3 months, sooner if needed. Call us if you decide on allergy testing-let us know if traditional or rush protocol after discussing with insurance.   Control of Dust Mite Allergen Dust mites play a major role in allergic asthma and rhinitis. They occur in environments with high humidity wherever human skin is found. Dust mites absorb humidity from the atmosphere (ie, they do not drink) and feed on organic  matter (including shed human and animal skin). Dust mites are a microscopic type of insect that you cannot see with the naked eye. High levels of dust mites have been detected from mattresses, pillows, carpets, upholstered furniture, bed covers, clothes, soft toys and any woven material. The principal allergen of the dust mite is found in its feces. A gram of dust may contain 1,000 mites and 250,000 fecal particles. Mite antigen is easily measured in the air during house cleaning activities. Dust mites do not bite and do not cause harm to humans, other than by triggering allergies/asthma.  Ways to decrease your exposure to dust mites in your home:  1. Encase mattresses, box springs and pillows with a mite-impermeable barrier or cover  2. Wash sheets, blankets and drapes weekly in hot water (130 F) with detergent and dry them in a dryer on the hot setting.  3. Have the room cleaned frequently with a vacuum cleaner and a damp dust-mop. For carpeting or rugs, vacuuming with a vacuum cleaner equipped with a high-efficiency particulate air (HEPA) filter. The dust mite allergic individual should not be in a room which is being cleaned and should wait 1 hour after cleaning before going into the room.  4. Do not sleep on upholstered furniture (eg, couches).  5. If possible removing carpeting, upholstered furniture and drapery from the home is ideal. Horizontal blinds should be eliminated in the rooms where the person spends the most time (bedroom,  study, television room). Washable vinyl, roller-type shades are optimal.  6. Remove all non-washable stuffed toys from the bedroom. Wash stuffed toys weekly like sheets and blankets above.  7. Reduce indoor humidity to less than 50%. Inexpensive humidity monitors can be purchased at most hardware stores. Do not use a humidifier as can make the problem worse and are not recommended.  Control of Mold Allergen Mold and fungi can grow on a variety of surfaces  provided certain temperature and moisture conditions exist.  Outdoor molds grow on plants, decaying vegetation and soil.  The major outdoor mold, Alternaria and Cladosporium, are found in very high numbers during hot and dry conditions.  Generally, a late Summer - Fall peak is seen for common outdoor fungal spores.  Rain will temporarily lower outdoor mold spore count, but counts rise rapidly when the rainy period ends.  The most important indoor molds are Aspergillus and Penicillium.  Dark, humid and poorly ventilated basements are ideal sites for mold growth.  The next most common sites of mold growth are the bathroom and the kitchen.  Outdoor [employer] Use air conditioning and keep windows closed Avoid exposure to decaying vegetation. Avoid leaf raking. Avoid grain handling. Consider wearing a face mask if working in moldy areas.  Indoor Mold Control Maintain humidity below 50%. Clean washable surfaces with 5% bleach solution. Remove sources e.g. Contaminated carpets.

## 2023-05-09 NOTE — Telephone Encounter (Signed)
EGD can be scheduled It is okay to continue Dexilant 60 mg daily through this process Let me know if she has any questions Indication eosinophilic esophagitis

## 2023-05-10 ENCOUNTER — Other Ambulatory Visit: Payer: Self-pay

## 2023-05-10 ENCOUNTER — Telehealth: Payer: Self-pay | Admitting: Internal Medicine

## 2023-05-10 DIAGNOSIS — K200 Eosinophilic esophagitis: Secondary | ICD-10-CM

## 2023-05-10 DIAGNOSIS — K2100 Gastro-esophageal reflux disease with esophagitis, without bleeding: Secondary | ICD-10-CM

## 2023-05-10 NOTE — Telephone Encounter (Signed)
Inbound call from patient stating that Dr. Rhea Belton wanted her to schedule a EGD. Patient was scheduled on 7/15 at 4:00 and is requesting instructions sent to her Mychart. Please advise.

## 2023-05-10 NOTE — Telephone Encounter (Signed)
Instructions sent to pt via mychart. Amb ref entered in epic.

## 2023-05-16 DIAGNOSIS — M9903 Segmental and somatic dysfunction of lumbar region: Secondary | ICD-10-CM | POA: Diagnosis not present

## 2023-05-16 DIAGNOSIS — M9901 Segmental and somatic dysfunction of cervical region: Secondary | ICD-10-CM | POA: Diagnosis not present

## 2023-05-16 DIAGNOSIS — M9902 Segmental and somatic dysfunction of thoracic region: Secondary | ICD-10-CM | POA: Diagnosis not present

## 2023-05-16 DIAGNOSIS — M5412 Radiculopathy, cervical region: Secondary | ICD-10-CM | POA: Diagnosis not present

## 2023-05-28 ENCOUNTER — Encounter: Payer: Self-pay | Admitting: Internal Medicine

## 2023-05-28 ENCOUNTER — Ambulatory Visit (AMBULATORY_SURGERY_CENTER): Payer: BC Managed Care – PPO | Admitting: Internal Medicine

## 2023-05-28 VITALS — BP 109/47 | HR 75 | Temp 98.9°F | Resp 14 | Ht 65.0 in | Wt 192.0 lb

## 2023-05-28 DIAGNOSIS — K229 Disease of esophagus, unspecified: Secondary | ICD-10-CM | POA: Diagnosis not present

## 2023-05-28 DIAGNOSIS — K2100 Gastro-esophageal reflux disease with esophagitis, without bleeding: Secondary | ICD-10-CM | POA: Diagnosis not present

## 2023-05-28 DIAGNOSIS — K209 Esophagitis, unspecified without bleeding: Secondary | ICD-10-CM | POA: Diagnosis not present

## 2023-05-28 DIAGNOSIS — K200 Eosinophilic esophagitis: Secondary | ICD-10-CM | POA: Diagnosis not present

## 2023-05-28 MED ORDER — DUPILUMAB 300 MG/2ML ~~LOC~~ SOSY
300.0000 mg | PREFILLED_SYRINGE | SUBCUTANEOUS | Status: DC
Start: 2023-05-28 — End: 2023-06-04

## 2023-05-28 MED ORDER — SODIUM CHLORIDE 0.9 % IV SOLN
500.0000 mL | Freq: Once | INTRAVENOUS | Status: DC
Start: 2023-05-28 — End: 2023-05-28

## 2023-05-28 NOTE — Progress Notes (Signed)
 Report to PACU, RN, vss, BBS= Clear.  

## 2023-05-28 NOTE — Progress Notes (Signed)
 Called to room to assist during endoscopic procedure.  Patient ID and intended procedure confirmed with present staff. Received instructions for my participation in the procedure from the performing physician.  

## 2023-05-28 NOTE — Op Note (Signed)
[facility] Patient Name: Carla Morales Procedure Date: 05/28/2023 12:47 PM MRN: [id number] Endoscopist: Beverley Fiedler , MD, [id number] Age: 62 Referring MD:  Date of Birth: [birth year] Gender: Female Account #: [id number] Procedure:                Upper GI endoscopy Indications:              Eosinophilic esophagitis, last EGD Dec 2023;                            currently on Dexilant 60 mg daily with residual                            heartburn and atypical chest pain Medicines:                Monitored Anesthesia Care Procedure:                Pre-Anesthesia Assessment:                           - Prior to the procedure, a History and Physical                            was performed, and patient medications and                            allergies were reviewed. The patient's tolerance of                            previous anesthesia was also reviewed. The risks                            and benefits of the procedure and the sedation                            options and risks were discussed with the patient.                            All questions were answered, and informed consent                            was obtained. Prior Anticoagulants: The patient has                            taken no anticoagulant or antiplatelet agents. ASA                            Grade Assessment: II - A patient with mild systemic                            disease. After reviewing the risks and benefits,                            the patient was deemed in satisfactory condition to  undergo the procedure.                           After obtaining informed consent, the endoscope was                            passed under direct vision. Throughout the                            procedure, the patient's blood pressure, pulse, and                            oxygen saturations were monitored continuously. The                            Olympus Scope [id number] was  introduced through the                            mouth, and advanced to the second part of duodenum.                            The upper GI endoscopy was accomplished without                            difficulty. The patient tolerated the procedure                            well. Scope In: Scope Out: Findings:                 Mucosal changes including longitudinal furrows and                            circumferential folds were found in the entire                            esophagus. Esophageal findings were graded using                            the Eosinophilic Esophagitis Endoscopic Reference                            Score (EoE-EREFS) as: Edema Grade 0 Normal                            (distinct vascular markings), Rings Grade 1 Mild                            (subtle circumferential ridges seen on esophageal                            distension), Exudates Grade 0 None (no white                            lesions seen), Furrows Grade 1 Mild (vertical lines  without visible depth) and partial stricture                            present at GEJ (15 mm luminal diameter). Biopsies                            were obtained from the proximal and distal                            esophagus with cold forceps for histology of                            suspected eosinophilic esophagitis.                           The entire examined stomach was normal.                           The examined duodenum was normal. Complications:            No immediate complications. Estimated Blood Loss:     Estimated blood loss was minimal. Impression:               - Esophageal mucosal changes secondary to                            eosinophilic esophagitis.                           - Normal stomach.                           - Normal examined duodenum.                           - Biopsies were taken with a cold forceps for                            evaluation of  eosinophilic esophagitis. Recommendation:           - Patient has a contact number available for                            emergencies. The signs and symptoms of potential                            delayed complications were discussed with the                            patient. Return to normal activities tomorrow.                            Written discharge instructions were provided to the                            patient.                           -  Resume previous diet.                           - Continue present medications.                           - Begin Dupixent 300 mg pre-filled pen once weekly                            for EoE.                           - Await pathology results. Beverley Fiedler, MD 05/28/2023 1:01:14 PM This report has been signed electronically.

## 2023-05-28 NOTE — Patient Instructions (Addendum)
Recommendation:           - Patient has a contact number available for                            emergencies. The signs and symptoms of potential                            delayed complications were discussed with the                            patient. Return to normal activities tomorrow.                            Written discharge instructions were provided to the                            patient.                           - Resume previous diet.                           - Continue present medications.                           - Begin Dupixent 300 mg pre-filled pen once weekly                            for EoE.                           - Await pathology results.  YOU HAD AN ENDOSCOPIC PROCEDURE TODAY AT THE [facility]:   Refer to the procedure report that was given to you for any specific questions about what was found during the examination.  If the procedure report does not answer your questions, please call your gastroenterologist to clarify.  If you requested that your care partner not be given the details of your procedure findings, then the procedure report has been included in a sealed envelope for you to review at your convenience later.  YOU SHOULD EXPECT: Some feelings of bloating in the abdomen. Passage of more gas than usual.  Walking can help get rid of the air that was put into your GI tract during the procedure and reduce the bloating. If you had a lower endoscopy (such as a colonoscopy or flexible sigmoidoscopy) you may notice spotting of blood in your stool or on the toilet paper. If you underwent a bowel prep for your procedure, you may not have a normal bowel movement for a few days.  Please Note:  You might notice some irritation and congestion in your nose or some drainage.  This is from the oxygen used during your procedure.  There is no need for concern and it should clear up in a day or so.  SYMPTOMS TO REPORT IMMEDIATELY:  Following upper  endoscopy (EGD)  Vomiting of blood or coffee ground material  New chest pain or pain under the shoulder blades  Painful or persistently difficult swallowing  New shortness of breath  Fever of 100F or higher  Black, tarry-looking stools  For urgent or emergent issues, a gastroenterologist can be reached at any hour by calling [phone]. Do not use MyChart messaging for urgent concerns.    DIET:  We do recommend a small meal at first, but then you may proceed to your regular diet.  Drink plenty of fluids but you should avoid alcoholic beverages for 24 hours.  ACTIVITY:  You should plan to take it easy for the rest of today and you should NOT DRIVE or use heavy machinery until tomorrow (because of the sedation medicines used during the test).    FOLLOW UP: Our staff will call the number listed on your records the next business day following your procedure.  We will call around 7:15- 8:00 am to check on you and address any questions or concerns that you may have regarding the information given to you following your procedure. If we do not reach you, we will leave a message.     If any biopsies were taken you will be contacted by phone or by letter within the next 1-3 weeks.  Please call us at [phone] if you have not heard about the biopsies in 3 weeks.    SIGNATURES/CONFIDENTIALITY: You and/or your care partner have signed paperwork which will be entered into your electronic medical record.  These signatures attest to the fact that that the information above on your After Visit Summary has been reviewed and is understood.  Full responsibility of the confidentiality of this discharge information lies with you and/or your care-partner.

## 2023-05-28 NOTE — Progress Notes (Signed)
GASTROENTEROLOGY PROCEDURE H&P NOTE   Primary Care Physician: Junie Spencer, FNP    Reason for Procedure:  Follow-up EoE  Plan:    EGD  Patient is appropriate for endoscopic procedure(s) in the ambulatory ([facility]) setting.  The nature of the procedure, as well as the risks, benefits, and alternatives were carefully and thoroughly reviewed with the patient. Ample time for discussion and questions allowed. The patient understood, was satisfied, and agreed to proceed.     HPI: Carla Morales is a 62 y.o. female who presents for EGD.  Medical history as below.   No recent chest pain or shortness of breath.  No abdominal pain today.  Past Medical History:  Diagnosis Date   Adenomatous colon polyp    Allergy    Arthritis    Asthma    COVID-19    Diverticulosis    Elevated alkaline phosphatase level    Elevated antinuclear antibody (ANA) level    Eosinophilic esophagitis    Esophageal dysmotility    Esophageal stenosis    Fatty liver    GERD (gastroesophageal reflux disease)    Hyperlipidemia    Internal hemorrhoids    Migraine    Pinched nerve in neck    Schatzki's ring    Sinus congestion    Urinary incontinence     Past Surgical History:  Procedure Laterality Date   ABDOMINAL HYSTERECTOMY     bladder dialation  1968   as a child   COLONOSCOPY     TONSILLECTOMY  1971   TOTAL VAGINAL HYSTERECTOMY  07/2010   menorrhagia, leiomyoma, prolapse   TUBAL LIGATION  1998    Prior to Admission medications   Medication Sig Start Date End Date Taking? Authorizing Provider  albuterol (VENTOLIN HFA) 108 (90 Base) MCG/ACT inhaler USE 2 INHALATIONS EVERY 6 HOURS AS NEEDED FOR WHEEZING OR SHORTNESS OF BREATH Need office visit for further refills 01/23/23  Yes Hawks, Christy A, FNP  atorvastatin (LIPITOR) 10 MG tablet Take 1 tablet (10 mg total) by mouth daily. 01/23/23  Yes Hawks, Christy A, FNP  azelastine (ASTELIN) 0.1 % nasal spray Place 2 sprays into both nostrils 2  (two) times daily as needed for rhinitis. Use in each nostril as directed 03/28/23  Yes Verlee Monte, MD  baclofen (LIORESAL) 10 MG tablet Take 1 tablet (10 mg total) by mouth 3 (three) times daily. 01/30/23  Yes Hawks, Christy A, FNP  fluticasone (FLONASE) 50 MCG/ACT nasal spray Place 2 sprays into both nostrils daily. 01/23/23  Yes Hawks, Christy A, FNP  hydrochlorothiazide (HYDRODIURIL) 12.5 MG tablet Take 1 tablet (12.5 mg total) by mouth daily. 01/23/23  Yes Hawks, Christy A, FNP  ipratropium (ATROVENT) 0.03 % nasal spray 1-2 sprays up to two times daily for drainage. 04/19/23  Yes Verlee Monte, MD  levocetirizine (XYZAL) 5 MG tablet Take 5 mg by mouth in the morning.   Yes [provider]  mometasone-formoterol (DULERA) 200-5 MCG/ACT AERO Inhale 2 puffs into the lungs 2 (two) times daily. 05/09/23  Yes Verlee Monte, MD  VITAMIN E PO Take 800 Int'l Units/day by mouth daily.   Yes [provider]  fluconazole (DIFLUCAN) 100 MG tablet Take 2 tabs by mouth on day 1 then take 1 tablet daily until gone. 05/09/23   Verlee Monte, MD  hydrOXYzine (ATARAX) 25 MG tablet Take 1-2 tablets nightly as needed for itching. Patient not taking: Reported on 05/09/2023 03/28/23   Verlee Monte, MD  meloxicam (  MOBIC) 15 MG tablet Take 1 tablet (15 mg total) by mouth daily. Patient not taking: Reported on 05/09/2023 01/30/23   Jannifer Rodney A, FNP  nitroGLYCERIN (NITROSTAT) 0.4 MG SL tablet Place 0.4 mg under the tongue every 5 (five) minutes as needed. Patient not taking: Reported on 05/09/2023 01/23/23   [provider]    Current Outpatient Medications  Medication Sig Dispense Refill   albuterol (VENTOLIN HFA) 108 (90 Base) MCG/ACT inhaler USE 2 INHALATIONS EVERY 6 HOURS AS NEEDED FOR WHEEZING OR SHORTNESS OF BREATH Need office visit for further refills 54 g 0   atorvastatin (LIPITOR) 10 MG tablet Take 1 tablet (10 mg total) by mouth daily. 90 tablet 0   azelastine (ASTELIN) 0.1 % nasal  spray Place 2 sprays into both nostrils 2 (two) times daily as needed for rhinitis. Use in each nostril as directed 30 mL 5   baclofen (LIORESAL) 10 MG tablet Take 1 tablet (10 mg total) by mouth 3 (three) times daily. 60 each 0   fluticasone (FLONASE) 50 MCG/ACT nasal spray Place 2 sprays into both nostrils daily. 16 g 6   hydrochlorothiazide (HYDRODIURIL) 12.5 MG tablet Take 1 tablet (12.5 mg total) by mouth daily. 90 tablet 0   ipratropium (ATROVENT) 0.03 % nasal spray 1-2 sprays up to two times daily for drainage. 30 mL 5   levocetirizine (XYZAL) 5 MG tablet Take 5 mg by mouth in the morning.     mometasone-formoterol (DULERA) 200-5 MCG/ACT AERO Inhale 2 puffs into the lungs 2 (two) times daily. 1 each 5   VITAMIN E PO Take 800 Int'l Units/day by mouth daily.     fluconazole (DIFLUCAN) 100 MG tablet Take 2 tabs by mouth on day 1 then take 1 tablet daily until gone. 3 tablet 0   hydrOXYzine (ATARAX) 25 MG tablet Take 1-2 tablets nightly as needed for itching. (Patient not taking: Reported on 05/09/2023) 60 tablet 3   meloxicam (MOBIC) 15 MG tablet Take 1 tablet (15 mg total) by mouth daily. (Patient not taking: Reported on 05/09/2023) 90 tablet 0   nitroGLYCERIN (NITROSTAT) 0.4 MG SL tablet Place 0.4 mg under the tongue every 5 (five) minutes as needed. (Patient not taking: Reported on 05/09/2023)     Current Facility-Administered Medications  Medication Dose Route Frequency Provider Last Rate Last Admin   0.9 %  sodium chloride infusion  500 mL Intravenous Once Shareen Capwell, Carie Caddy, MD        Allergies as of 05/28/2023 - Review Complete 05/28/2023  Allergen Reaction Noted   Other Swelling 10/16/2013   Contrast media [iodinated contrast media]  09/18/2019   Haldol [haloperidol decanoate]  05/19/2011   Pravastatin Other (See Comments) 08/15/2013   Sulfa antibiotics Rash 05/19/2011    Family History  Problem Relation Age of Onset   Allergic rhinitis Mother    Asthma Mother    Rheum arthritis  Mother    Fibromyalgia Mother    Cancer Mother        breast, uterine, adenocarcinoma-right lung   Uterine cancer Mother        Sarcoma   Lung cancer Mother        Adenocarcinoma   Breast cancer Mother 46   Hypertension Father    Melanoma Father 33   Asthma Sister    Asthma Maternal Aunt    Diabetes Maternal Aunt    Cancer Maternal Aunt 26       GYN cancer - possibly vulvar?   Lung cancer Maternal Aunt  Diabetes Maternal Uncle    Appendicitis Maternal Uncle        d. 12   Diabetes Paternal Aunt    Breast cancer Paternal Aunt 11   Breast cancer Paternal Aunt 50   Uterine cancer Paternal Aunt 47   Breast cancer Paternal Aunt 90   Diabetes Paternal Uncle    Other Maternal Grandmother 30       childbirth   Heart disease Maternal Grandfather    Cancer Paternal Grandmother        unknown   Other Daughter        Phyllodes tumor   Other Nephew        Gilbert's Disease   Colon cancer Neg Hx    Esophageal cancer Neg Hx    Ulcerative colitis Neg Hx    Stomach cancer Neg Hx    Pancreatic cancer Neg Hx     Social History   Socioeconomic History   Marital status: Married    Spouse name: Not on file   Number of children: 1   Years of education: Not on file   Highest education level: Not on file  Occupational History   Not on file  Tobacco Use   Smoking status: Never    Passive exposure: Never   Smokeless tobacco: Never  Vaping Use   Vaping status: Never Used  Substance and Sexual Activity   Alcohol use: No    Alcohol/week: 0.0 standard drinks of alcohol   Drug use: No   Sexual activity: Yes    Partners: Male    Birth control/protection: Surgical    Comment: hyst.-1st intercourse 51 yo-1 partner  Other Topics Concern   Not on file  Social History Narrative   Not on file   Social Determinants of Health   Financial Resource Strain: Not on file  Food Insecurity: Not on file  Transportation Needs: Not on file  Physical Activity: Not on file  Stress: Not on  file  Social Connections: Not on file  Intimate Partner Violence: Not on file    Physical Exam: Vital signs in last 24 hours: @BP  (!) 103/56   Pulse 75   Temp 98.9 F (37.2 C) (Temporal)   Ht 5\' 5"  (1.651 m)   Wt 192 lb (87.1 kg)   LMP 07/24/2010   SpO2 98%   BMI 31.95 kg/m  GEN: NAD EYE: Sclerae anicteric ENT: MMM CV: Non-tachycardic Pulm: CTA b/l GI: Soft, NT/ND NEURO:  Alert & Oriented x 3   Erick Blinks, MD [facility]  05/28/2023 12:39 PM

## 2023-05-29 ENCOUNTER — Telehealth: Payer: Self-pay | Admitting: *Deleted

## 2023-05-29 DIAGNOSIS — K2100 Gastro-esophageal reflux disease with esophagitis, without bleeding: Secondary | ICD-10-CM

## 2023-05-29 MED ORDER — SUCRALFATE 1 GM/10ML PO SUSP
ORAL | 1 refills | Status: DC
Start: 2023-05-29 — End: 2023-06-27

## 2023-05-29 NOTE — Telephone Encounter (Addendum)
  Follow up Call-     05/28/2023   12:18 PM 10/18/2022    1:02 PM  Call back number  Post procedure Call Back phone  # [phone]  Permission to leave phone message Yes Yes     Patient questions:  Do you have a fever, pain , or abdominal swelling? Yes.   Pain Score  8 *  Have you tolerated food without any problems? No.  Have you been able to return to your normal activities? No.  Do you have any questions about your discharge instructions: Diet   No. Medications  No. Follow up visit  No.  Do you have questions or concerns about your Care? No.  Actions: * If pain score is 4 or above: Physician/ provider Notified : Erick Blinks, MD.  Dr. Rhea Belton,  I spoke with this pt this am.  She states, "I am having chest pressure and swallowing issues like I've never had after an endoscopy."  Rates swallowing discomfort as an "8" and when at rest as a "6."  She states she is not able to go to work today because of this discomfort. I know one of her diagnosis was atypical chest pain and I asked her if this felt like what she was feeling prior to procedure and she says no.  I asked her if the pain/swallowing was so bad that she needed to go to ED and she says no.   Please advise.  Thanks, [employer]

## 2023-05-29 NOTE — Telephone Encounter (Signed)
 LMOM to call back

## 2023-05-29 NOTE — Telephone Encounter (Signed)
Ok, this is probably esophageal pain post biopsy. Her EoE likely makes her more susceptible to discomfort Would offer liquid sucralfate 1 g TIDAC and HS PRN for 3-5 days until pain improves Avoid acidic, hot and very cold liquids until better If worsening she needs to call back and if not significantly better by tomorrow she should let me know

## 2023-05-29 NOTE — Addendum Note (Signed)
Addended by: Inocente Salles on: 05/29/2023 09:42 AM   Modules accepted: Orders

## 2023-05-29 NOTE — Telephone Encounter (Signed)
Spoke with pt and gave her Dr. Lauro Franklin recommendation.  RX sent to pharmacy

## 2023-06-01 ENCOUNTER — Other Ambulatory Visit (HOSPITAL_COMMUNITY): Payer: Self-pay

## 2023-06-02 ENCOUNTER — Telehealth: Payer: Self-pay | Admitting: Pharmacy Technician

## 2023-06-02 NOTE — Telephone Encounter (Signed)
Pharmacy Patient Advocate Encounter   Received notification from CoverMyMeds that prior authorization for SUCRALFATE SUSP is required/requested.   Insurance verification completed.   The patient is insured through [facility] .   Per test claim: PA submitted to [employer] via CoverMyMeds Key/confirmation #/EOC [facility] Status is pending

## 2023-06-04 ENCOUNTER — Encounter: Payer: Self-pay | Admitting: Internal Medicine

## 2023-06-04 MED ORDER — DUPIXENT 300 MG/2ML ~~LOC~~ SOAJ: SUBCUTANEOUS | 12 refills | Status: DC

## 2023-06-04 NOTE — Telephone Encounter (Signed)
PA team, please initiate PA for Dupixent 300 mg weekly, pre-filled pen. Thanks

## 2023-06-05 ENCOUNTER — Telehealth: Payer: Self-pay | Admitting: Pharmacy Technician

## 2023-06-05 ENCOUNTER — Other Ambulatory Visit: Payer: Self-pay | Admitting: Internal Medicine

## 2023-06-05 ENCOUNTER — Other Ambulatory Visit (HOSPITAL_COMMUNITY): Payer: Self-pay

## 2023-06-05 DIAGNOSIS — K2100 Gastro-esophageal reflux disease with esophagitis, without bleeding: Secondary | ICD-10-CM

## 2023-06-05 NOTE — Telephone Encounter (Signed)
Pharmacy Patient Advocate Encounter   Received notification from Patient Advice Request messages that prior authorization for DUPIXENT 300MG  is required/requested.   Insurance verification completed.   The patient is insured through [facility] .   Per test claim: PA submitted to [employer] via CoverMyMeds Key/confirmation #/EOC [id number] Status is pending

## 2023-06-05 NOTE — Telephone Encounter (Signed)
Patient Advocate Encounter  Additional information requested by insurance to proceed with prior authorization. Form filled out and faxed to [facility] at [phone] on 06-05-2023

## 2023-06-05 NOTE — Telephone Encounter (Signed)
PA has been submitted, and telephone encounter has been created. 

## 2023-06-06 ENCOUNTER — Other Ambulatory Visit (HOSPITAL_COMMUNITY): Payer: Self-pay

## 2023-06-06 NOTE — Telephone Encounter (Signed)
Pharmacy Patient Advocate Encounter  Received notification from [facility] that Prior Authorization for DUPIXENT 300MG  has been APPROVED from 7.23.24 to 7.23.25. Ran test claim, Copay is $MUST FILL AT BELOW PHARMACY.  PA #/Case ID/Reference #: [id number]

## 2023-06-07 ENCOUNTER — Encounter: Payer: Self-pay | Admitting: *Deleted

## 2023-06-07 MED ORDER — DUPIXENT 300 MG/2ML ~~LOC~~ SOAJ: SUBCUTANEOUS | 12 refills | Status: DC

## 2023-06-07 NOTE — Telephone Encounter (Signed)
This has been addressed. Dupixent approved and sent to [facility] pharmacy.

## 2023-06-20 DIAGNOSIS — M5412 Radiculopathy, cervical region: Secondary | ICD-10-CM | POA: Diagnosis not present

## 2023-06-20 DIAGNOSIS — M9903 Segmental and somatic dysfunction of lumbar region: Secondary | ICD-10-CM | POA: Diagnosis not present

## 2023-06-20 DIAGNOSIS — M9901 Segmental and somatic dysfunction of cervical region: Secondary | ICD-10-CM | POA: Diagnosis not present

## 2023-06-20 DIAGNOSIS — M9902 Segmental and somatic dysfunction of thoracic region: Secondary | ICD-10-CM | POA: Diagnosis not present

## 2023-06-27 ENCOUNTER — Ambulatory Visit (INDEPENDENT_AMBULATORY_CARE_PROVIDER_SITE_OTHER): Payer: BC Managed Care – PPO | Admitting: Radiology

## 2023-06-27 ENCOUNTER — Encounter: Payer: Self-pay | Admitting: Radiology

## 2023-06-27 ENCOUNTER — Other Ambulatory Visit (HOSPITAL_COMMUNITY)
Admission: RE | Admit: 2023-06-27 | Discharge: 2023-06-27 | Disposition: A | Discharge status: Home / Self Care | Payer: BC Managed Care – PPO | Source: Ambulatory Visit | Attending: Radiology | Admitting: Radiology

## 2023-06-27 VITALS — BP 104/72 | Ht 64.5 in | Wt 197.0 lb

## 2023-06-27 DIAGNOSIS — Z780 Asymptomatic menopausal state: Secondary | ICD-10-CM

## 2023-06-27 DIAGNOSIS — N3941 Urge incontinence: Secondary | ICD-10-CM | POA: Diagnosis not present

## 2023-06-27 DIAGNOSIS — R3915 Urgency of urination: Secondary | ICD-10-CM

## 2023-06-27 DIAGNOSIS — Z01419 Encounter for gynecological examination (general) (routine) without abnormal findings: Secondary | ICD-10-CM | POA: Diagnosis not present

## 2023-06-27 LAB — URINALYSIS, COMPLETE W/RFL CULTURE
Bacteria, UA: NONE SEEN /HPF
Glucose, UA: NEGATIVE
Hgb urine dipstick: NEGATIVE
Hyaline Cast: NONE SEEN /LPF
Ketones, ur: NEGATIVE
Leukocyte Esterase: NEGATIVE
Nitrites, Initial: NEGATIVE
RBC / HPF: NONE SEEN /HPF (ref 0–2)
Specific Gravity, Urine: 1.025 (ref 1.001–1.035)
WBC, UA: NONE SEEN /HPF (ref 0–5)
pH: 6 (ref 5.0–8.0)

## 2023-06-27 LAB — NO CULTURE INDICATED

## 2023-06-27 NOTE — Progress Notes (Signed)
   Carla Morales [facility] 1961/06/25 [id number]   History:  61 y.o. G1P1 presents for annual exam. C/o urinary urgency and urge incontinence over the past year, has to wear depends if she travels and doesn't know where all bathrooms are. Hx of bladder tack at age 65. Started a new med for EOE and asthma, has had more joint pain since.   Gynecologic History Hysterectomy: DUB fibroids  Sexually active: yes  Health Maintenance Last Pap: 2019. Results were: normal Last mammogram: 2024. Results were: normal Last colonoscopy: 2023.  Last Dexa: 2016   Past medical history, past surgical history, family history and social history were all reviewed and documented in the EPIC chart.  ROS:  A ROS was performed and pertinent positives and negatives are included.  Exam:  Vitals:   06/27/23 0802  BP: 104/72  Weight: 197 lb (89.4 kg)  Height: 5' 4.5" (1.638 m)   Body mass index is 33.29 kg/m.  General appearance:  Normal Thyroid:  Symmetrical, normal in size, without palpable masses or nodularity. Respiratory  Auscultation:  Clear without wheezing or rhonchi Cardiovascular  Auscultation:  Regular rate, without rubs, murmurs or gallops  Edema/varicosities:  Not grossly evident Abdominal  Soft,nontender, without masses, guarding or rebound.  Liver/spleen:  No organomegaly noted  Hernia:  None appreciated  Skin  Inspection:  Grossly normal Breasts: Examined lying and sitting.   Right: Without masses, retractions, nipple discharge or axillary adenopathy.   Left: Without masses, retractions, nipple discharge or axillary adenopathy. Genitourinary   Inguinal/mons:  Normal without inguinal adenopathy  External genitalia:  Normal appearing vulva with no masses, tenderness, or lesions  BUS/Urethra/Skene's glands:  Normal  Vagina:  Normal appearing with normal color and discharge, no lesions. Atrophy mild. Poor vaginal tone, no prolapse appreciated with valsalva.  Cervix:  absent  Uterus:   absent  Adnexa/parametria:     Rt: Normal in size, without masses or tenderness.   Lt: Normal in size, without masses or tenderness.  Anus and perineum: Normal   Raynelle Fanning, CMA present for exam  Urine dipstick shows positive for protein and positive for urobilinogen.  Micro exam: negative for WBC's or RBC's.   Assessment/Plan:   1. Well woman exam with routine gynecological exam - Cytology - PAP( [place])  2. Urinary urgency Reassured neg u/a - Urinalysis,Complete w/RFL Culture  3. Urge incontinence - Ambulatory referral to Physical Therapy  4. Post-menopausal May have DEXA with next mammogram - DG Bone Density; Future    Discussed SBE, colonoscopy and DEXA screening as appropriate. Encouraged 133mins/week of cardiovascular and weight bearing exercise minimum. Recommend the use of seatbelts and sunscreen consistently.   Return in 1 year for annual or sooner prn.  Tanda Rockers WHNP-BC 8:32 AM 06/27/2023

## 2023-06-28 ENCOUNTER — Other Ambulatory Visit: Payer: Self-pay

## 2023-06-28 ENCOUNTER — Telehealth: Payer: Self-pay | Admitting: Internal Medicine

## 2023-06-28 ENCOUNTER — Ambulatory Visit: Payer: BC Managed Care – PPO

## 2023-06-28 ENCOUNTER — Ambulatory Visit
Admission: EM | Admit: 2023-06-28 | Discharge: 2023-06-28 | Disposition: A | Discharge status: Home / Self Care | Payer: BC Managed Care – PPO | Attending: Family Medicine | Admitting: Family Medicine

## 2023-06-28 ENCOUNTER — Encounter: Payer: Self-pay | Admitting: Emergency Medicine

## 2023-06-28 DIAGNOSIS — M25562 Pain in left knee: Secondary | ICD-10-CM

## 2023-06-28 DIAGNOSIS — M1712 Unilateral primary osteoarthritis, left knee: Secondary | ICD-10-CM

## 2023-06-28 MED ORDER — CELECOXIB 200 MG PO CAPS: 200.0000 mg | ORAL_CAPSULE | Freq: Every day | ORAL | 0 refills | Status: AC

## 2023-06-28 MED ORDER — HYDROCODONE-ACETAMINOPHEN 5-325 MG PO TABS: 1.0000 | ORAL_TABLET | Freq: Four times a day (QID) | ORAL | 0 refills | Status: DC | PRN

## 2023-06-28 NOTE — Telephone Encounter (Signed)
Patient called states she just started Dupixent and has been having some leg\knee pain\cramps not sure if that is related to the medication. It started on Sunday evening and is seeking further advise.

## 2023-06-28 NOTE — Discharge Instructions (Addendum)
Advised patient to take medication (Celebrex) as directed with food to completion.  Advised patient may use Norco every 6 hours for breakthrough left knee pain.  Patient advised of sedate of effects of this medication.  Encouraged increase daily water intake to 64 ounces per day while taking this medication.  Advised if symptoms worsen and/or unresolved please follow-up with PCP, [facility] (contact information provided with his AVS), or here for further evaluation.

## 2023-06-28 NOTE — ED Triage Notes (Signed)
Pain since Sunday night.  Pain on medial left knee and anterior left knee.  Reports pain radiated down left calf and into right thigh.  Patient denies any injury.  Patient has been battling calf cramps at night.  Patient reports a tight feeling to left leg.    Patient is taking a muscle relaxer at night.    Left knee is swelling and lower leg swelling.    Most recent medication is dupixent.  Leg cramping /joint aches had occurred prior to starting this medicine.    Patient reports a left knee injury on a motor back as a small child/played sports as a teenager.  But never any thing like this

## 2023-06-28 NOTE — Telephone Encounter (Signed)
Agree 

## 2023-06-28 NOTE — Telephone Encounter (Signed)
Called patient in response to her complaints of pain at lower legs and left knee pain. Patient also states there is swelling noted in her left knee as well. Nurse asked when did the pain start and for how long? Patient states the pain started prior to starting the Dupixent medication. Patient also informed the nurse that she has issues with muscle aches and pains or cramps for a long period of time, and was not sure if the Dupixent had to do with the pain, although patient states the pain started prior to taking the Dupixent. Patient states the pain level is at a 8 and is difficult when she tries to walk. Suggested the patient go to the ED if she is that much pain, and to also notify her PCP. Patient agreed.

## 2023-06-28 NOTE — ED Provider Notes (Signed)
[facility]    CSN: [id number] Arrival date & time: 06/28/23  1315      History   Chief Complaint Chief Complaint  Patient presents with   Knee Pain    HPI Carla Morales is a 62 y.o. female.   HPI Pleasant 62 year old female presents with left knee pain x 5 days.  Denies injury or insult to left knee.  PMH significant for obesity, esophageal stenosis, and migraine.  Past Medical History:  Diagnosis Date   Adenomatous colon polyp    Allergy    Arthritis    Asthma    COVID-19    Diverticulosis    Elevated alkaline phosphatase level    Elevated antinuclear antibody (ANA) level    Eosinophilic esophagitis    Esophageal dysmotility    Esophageal stenosis    Fatty liver    GERD (gastroesophageal reflux disease)    Hyperlipidemia    Internal hemorrhoids    Migraine    Pinched nerve in neck    Schatzki's ring    Sinus congestion    Urinary incontinence     Patient Active Problem List   Diagnosis Date Noted   Acute non-recurrent frontal sinusitis 04/27/2023   Pruritus 03/28/2023   Allergic conjunctivitis of both eyes 03/28/2023   Peripheral edema 07/11/2021   Asthma 01/07/2021   GERD (gastroesophageal reflux disease) 10/29/2019   Vitamin D deficiency 10/29/2019   Family history of breast cancer    Family history of uterine cancer    Family history of lung cancer    Family history of melanoma    Obesity (BMI 30-39.9) 03/29/2018   Perennial allergic rhinitis    Hyperlipidemia     Past Surgical History:  Procedure Laterality Date   ABDOMINAL HYSTERECTOMY     bladder dialation  1968   as a child   COLONOSCOPY     TONSILLECTOMY  1971   TOTAL VAGINAL HYSTERECTOMY  07/2010   menorrhagia, leiomyoma, prolapse   TUBAL LIGATION  1998    OB History     Gravida  1   Para  1   Term      Preterm      AB      Living  1      SAB      IAB      Ectopic      Multiple      Live Births               Home Medications     Prior to Admission medications   Medication Sig Start Date End Date Taking? Authorizing Provider  celecoxib (CELEBREX) 200 MG capsule Take 1 capsule (200 mg total) by mouth daily for 15 days. 06/28/23 07/13/23 Yes Trevor Iha, FNP  HYDROcodone-acetaminophen (NORCO/VICODIN) 5-325 MG tablet Take 1 tablet by mouth every 6 (six) hours as needed for up to 7 days. 06/28/23 07/05/23 Yes Trevor Iha, FNP  albuterol (VENTOLIN HFA) 108 (90 Base) MCG/ACT inhaler USE 2 INHALATIONS EVERY 6 HOURS AS NEEDED FOR WHEEZING OR SHORTNESS OF BREATH Need office visit for further refills 01/23/23   Jannifer Rodney A, FNP  atorvastatin (LIPITOR) 10 MG tablet Take 1 tablet (10 mg total) by mouth daily. 01/23/23   Jannifer Rodney A, FNP  azelastine (ASTELIN) 0.1 % nasal spray Place 2 sprays into both nostrils 2 (two) times daily as needed for rhinitis. Use in each nostril as directed 03/28/23   Verlee Monte, MD  baclofen (LIORESAL) 10 MG tablet Take  1 tablet (10 mg total) by mouth 3 (three) times daily. 01/30/23   Junie Spencer, FNP  dexlansoprazole (DEXILANT) 60 MG capsule Take by mouth. 05/15/23   [provider]  Dupilumab (DUPIXENT) 300 MG/2ML SOPN Inject 1 pen (300 mg total) into the skin once a week. 06/07/23   Pyrtle, Carie Caddy, MD  fluconazole (DIFLUCAN) 100 MG tablet Take 2 tabs by mouth on day 1 then take 1 tablet daily until gone. 05/09/23   Verlee Monte, MD  fluticasone (FLONASE) 50 MCG/ACT nasal spray Place 2 sprays into both nostrils daily. 01/23/23   Jannifer Rodney A, FNP  hydrochlorothiazide (HYDRODIURIL) 12.5 MG tablet Take 1 tablet (12.5 mg total) by mouth daily. 01/23/23   Junie Spencer, FNP  hydrOXYzine (ATARAX) 25 MG tablet Take 1-2 tablets nightly as needed for itching. 03/28/23   Verlee Monte, MD  Ibuprofen (ADVIL PO) Take by mouth.    [provider]  ipratropium (ATROVENT) 0.03 % nasal spray 1-2 sprays up to two times daily for drainage. 04/19/23   Verlee Monte, MD  levocetirizine  (XYZAL) 5 MG tablet Take 5 mg by mouth in the morning.    [provider]  meloxicam (MOBIC) 15 MG tablet Take 1 tablet (15 mg total) by mouth daily. 01/30/23   Hawks, Neysa Bonito A, FNP  mometasone-formoterol (DULERA) 200-5 MCG/ACT AERO Inhale 2 puffs into the lungs 2 (two) times daily. 05/09/23   Verlee Monte, MD  nitroGLYCERIN (NITROSTAT) 0.4 MG SL tablet Place 0.4 mg under the tongue every 5 (five) minutes as needed. 01/23/23   [provider]  VITAMIN E PO Take 800 Int'l Units/day by mouth daily.    [provider]    Family History Family History  Problem Relation Age of Onset   Allergic rhinitis Mother    Asthma Mother    Rheum arthritis Mother    Fibromyalgia Mother    Cancer Mother        breast, uterine, adenocarcinoma-right lung   Uterine cancer Mother        Sarcoma   Lung cancer Mother        Adenocarcinoma   Breast cancer Mother 52   Hypertension Father    Melanoma Father 8   Asthma Sister    Asthma Maternal Aunt    Diabetes Maternal Aunt    Cancer Maternal Aunt 63       GYN cancer - possibly vulvar?   Lung cancer Maternal Aunt    Diabetes Maternal Uncle    Appendicitis Maternal Uncle        d. 12   Diabetes Paternal Aunt    Breast cancer Paternal Aunt 77   Breast cancer Paternal Aunt 47   Uterine cancer Paternal Aunt 49   Breast cancer Paternal Aunt 78   Diabetes Paternal Uncle    Other Maternal Grandmother 30       childbirth   Heart disease Maternal Grandfather    Cancer Paternal Grandmother        unknown   Other Daughter        Phyllodes tumor   Other Nephew        Gilbert's Disease   Colon cancer Neg Hx    Esophageal cancer Neg Hx    Ulcerative colitis Neg Hx    Stomach cancer Neg Hx    Pancreatic cancer Neg Hx     Social History Social History   Tobacco Use   Smoking status: Never  Passive exposure: Never   Smokeless tobacco: Never  Vaping Use   Vaping status: Never Used  Substance Use Topics   Alcohol use:  No    Alcohol/week: 0.0 standard drinks of alcohol   Drug use: No     Allergies   Other, Contrast media [iodinated contrast media], Haldol [haloperidol decanoate], Pravastatin, and Sulfa antibiotics   Review of Systems Review of Systems  Musculoskeletal:        Left knee pain x 5 days     Physical Exam Triage Vital Signs ED Triage Vitals  Encounter Vitals Group     BP      Systolic BP Percentile      Diastolic BP Percentile      Pulse      Resp      Temp      Temp src      SpO2      Weight      Height      Head Circumference      Peak Flow      Pain Score      Pain Loc      Pain Education      Exclude from Growth Chart    No data found.  Updated Vital Signs BP 139/85 (BP Location: Right Arm)   Pulse 96   Temp 99.3 F (37.4 C) (Oral)   Resp 20   LMP 07/24/2010   SpO2 96%     Physical Exam Vitals and nursing note reviewed.  Constitutional:      Appearance: Normal appearance. She is obese.  HENT:     Head: Normocephalic and atraumatic.     Mouth/Throat:     Mouth: Mucous membranes are moist.     Pharynx: Oropharynx is clear.  Eyes:     Extraocular Movements: Extraocular movements intact.     Conjunctiva/sclera: Conjunctivae normal.     Pupils: Pupils are equal, round, and reactive to light.  Cardiovascular:     Rate and Rhythm: Normal rate and regular rhythm.     Pulses: Normal pulses.     Heart sounds: Normal heart sounds.  Pulmonary:     Effort: Pulmonary effort is normal.     Breath sounds: Normal breath sounds. No wheezing, rhonchi or rales.  Musculoskeletal:        General: Normal range of motion.     Cervical back: Normal range of motion and neck supple.     Comments: Left knee (anterior aspect): TTP over inferior medial edge of patella with mild soft tissue swelling noted, pain elicited with flexion/extension.  Exam limited due to pain today  Skin:    General: Skin is warm and dry.  Neurological:     General: No focal deficit present.      Mental Status: She is alert and oriented to person, place, and time. Mental status is at baseline.  Psychiatric:        Mood and Affect: Mood normal.        Behavior: Behavior normal.      UC Treatments / Results  Labs (all labs ordered are listed, but only abnormal results are displayed) Labs Reviewed - No data to display  EKG   Radiology DG Knee Complete 4 Views Left  Result Date: 06/28/2023 CLINICAL DATA:  Left knee pain EXAM: LEFT KNEE - COMPLETE 4+ VIEW COMPARISON:  None Available. FINDINGS: There is chondrocalcinosis. Joint spaces slightly narrowed. No acute bony abnormality. Specifically, no fracture, subluxation, or dislocation. No joint effusion.  IMPRESSION: Early joint space narrowing with chondrocalcinosis. No acute bony abnormality. Electronically Signed   By: Charlett Nose M.D.   On: 06/28/2023 16:40    Procedures Procedures (including critical care time)  Medications Ordered in UC Medications - No data to display  Initial Impression / Assessment and Plan / UC Course  I have reviewed the triage vital signs and the nursing notes.  Pertinent labs & imaging results that were available during my care of the patient were reviewed by me and considered in my medical decision making (see chart for details).     MDM: 1. Acute pain of left knee-left knee x-ray results revealed above Rx'd Norco 5/325 mg tablet: Take 1 tablet every 6 hours as needed for breakthrough left knee pain; 2.  Primary osteoarthritis of left knee-Rx'd Celebrex 200 mg capsule daily x 15 days. Advised patient to take medication (Celebrex) as directed with food to completion.  Advised patient may use Norco every 6 hours for breakthrough left knee pain.  Patient advised of sedate of effects of this medication.  Encouraged increase daily water intake to 64 ounces per day while taking this medication.  Advised if symptoms worsen and/or unresolved please follow-up with PCP, [facility] (contact  information provided with his AVS), or here for further evaluation.  Final Clinical Impressions(s) / UC Diagnoses   Final diagnoses:  Acute pain of left knee  Primary osteoarthritis of left knee     Discharge Instructions      Advised patient to take medication (Celebrex) as directed with food to completion.  Advised patient may use Norco every 6 hours for breakthrough left knee pain.  Patient advised of sedate of effects of this medication.  Encouraged increase daily water intake to 64 ounces per day while taking this medication.  Advised if symptoms worsen and/or unresolved please follow-up with PCP, [facility] (contact information provided with his AVS), or here for further evaluation.     ED Prescriptions     Medication Sig Dispense Auth. Provider   celecoxib (CELEBREX) 200 MG capsule Take 1 capsule (200 mg total) by mouth daily for 15 days. 15 capsule Trevor Iha, FNP   HYDROcodone-acetaminophen (NORCO/VICODIN) 5-325 MG tablet Take 1 tablet by mouth every 6 (six) hours as needed for up to 7 days. 21 tablet Trevor Iha, FNP      I have reviewed the PDMP during this encounter.   Trevor Iha, FNP 06/28/23 1709

## 2023-06-30 ENCOUNTER — Telehealth: Payer: Self-pay | Admitting: Emergency Medicine

## 2023-06-30 ENCOUNTER — Telehealth: Payer: Self-pay | Admitting: Family Medicine

## 2023-06-30 MED ORDER — HYDROCODONE-ACETAMINOPHEN 7.5-325 MG PO TABS: 1.0000 | ORAL_TABLET | Freq: Four times a day (QID) | ORAL | 0 refills | Status: DC | PRN

## 2023-06-30 NOTE — Telephone Encounter (Signed)
Patient called.  She states that her pharmacy was unable to fill her hydrocodone because it is backordered.  She requested different medication.  Will try hydrocodone at a different dose.

## 2023-06-30 NOTE — Telephone Encounter (Signed)
Patient called stating that she was seen on Thursday and unable to get her Hydrocodone prescription filled due to it being on back order for several weeks.  [facility]  Patient is requesting a different medication.  [facility] [place]  Please advise.

## 2023-07-02 LAB — CYTOLOGY - PAP
Comment: NEGATIVE
Diagnosis: NEGATIVE
High risk HPV: NEGATIVE

## 2023-07-04 ENCOUNTER — Ambulatory Visit: Payer: BC Managed Care – PPO | Admitting: Internal Medicine

## 2023-07-05 ENCOUNTER — Telehealth: Payer: Self-pay | Admitting: Internal Medicine

## 2023-07-05 NOTE — Telephone Encounter (Signed)
Patient has had issues going on for a while now. Patient states there have been issues at her workplace and some tests were run. Patient does not have access to see report but they are going to go ito the 1st stage of fixing said issues. There is mold in the building, patient believes this may be the cause of her increase in allergy issues.   In the building where patient works at there is no constant airflow and right now they are going to be replacing wallpaper and replacing the floor in a room.   Patient was provided workerscomp from her job due to this and patient is working a different building at the moment.   Best contact number: [phone]

## 2023-07-06 NOTE — Telephone Encounter (Signed)
Patient wanted to make [place] aware and is not sure if we need to complete any forms at this time. She will call back if our assistance is needed. They are still working on the mold issue at her job. I provided the patient with our [place] fax number just in case she needs [place] to fill anything out.

## 2023-07-06 NOTE — Telephone Encounter (Signed)
Thank you Diandra!

## 2023-07-13 ENCOUNTER — Other Ambulatory Visit: Payer: Self-pay | Admitting: Family

## 2023-07-13 DIAGNOSIS — E7800 Pure hypercholesterolemia, unspecified: Secondary | ICD-10-CM

## 2023-07-13 DIAGNOSIS — R600 Localized edema: Secondary | ICD-10-CM

## 2023-07-18 ENCOUNTER — Encounter: Payer: Self-pay | Admitting: Internal Medicine

## 2023-07-18 ENCOUNTER — Ambulatory Visit: Payer: BC Managed Care – PPO | Admitting: Internal Medicine

## 2023-07-18 VITALS — BP 100/70 | HR 84 | Ht 65.5 in | Wt 195.4 lb

## 2023-07-18 DIAGNOSIS — K200 Eosinophilic esophagitis: Secondary | ICD-10-CM

## 2023-07-18 DIAGNOSIS — K760 Fatty (change of) liver, not elsewhere classified: Secondary | ICD-10-CM

## 2023-07-18 DIAGNOSIS — M9903 Segmental and somatic dysfunction of lumbar region: Secondary | ICD-10-CM | POA: Diagnosis not present

## 2023-07-18 DIAGNOSIS — K2100 Gastro-esophageal reflux disease with esophagitis, without bleeding: Secondary | ICD-10-CM

## 2023-07-18 DIAGNOSIS — M9902 Segmental and somatic dysfunction of thoracic region: Secondary | ICD-10-CM | POA: Diagnosis not present

## 2023-07-18 DIAGNOSIS — M9901 Segmental and somatic dysfunction of cervical region: Secondary | ICD-10-CM | POA: Diagnosis not present

## 2023-07-18 DIAGNOSIS — M5412 Radiculopathy, cervical region: Secondary | ICD-10-CM | POA: Diagnosis not present

## 2023-07-18 NOTE — Patient Instructions (Addendum)
Continue Dexilant daily and Dupixent once a week.  Please follow up with Dr. Rhea Belton in 6 months.   _______________________________________________________  If your blood pressure at your visit was 140/90 or greater, please contact your primary care physician to follow up on this.  _______________________________________________________  If you are age 60 or older, your body mass index should be between 23-30. Your Body mass index is 32.02 kg/m. If this is out of the aforementioned range listed, please consider follow up with your Primary Care Provider.  If you are age 50 or younger, your body mass index should be between 19-25. Your Body mass index is 32.02 kg/m. If this is out of the aformentioned range listed, please consider follow up with your Primary Care Provider.   ________________________________________________________  The [facility] providers would like to encourage you to use [facility] to communicate with providers for non-urgent requests or questions.  Due to long hold times on the telephone, sending your provider a message by [facility] may be a faster and more efficient way to get a response.  Please allow 48 business hours for a response.  Please remember that this is for non-urgent requests.  _______________________________________________________

## 2023-07-19 ENCOUNTER — Encounter: Payer: Self-pay | Admitting: Internal Medicine

## 2023-07-19 NOTE — Progress Notes (Signed)
Subjective:    Patient ID: Carla Morales, female    DOB: 11/10/1961, 62 y.o.   MRN: [id number]  HPI Carla Morales is a 61 year old female with a history of eosinophilic esophagitis, GERD, allergy/asthma, MASH, prior adenomatous colon polyps, hyperlipidemia who is seen for follow-up.  She is here alone today and was last seen in the office on 03/29/2023 and for upper endoscopy on 05/28/2023.  She is doing better with her current esophageal symptoms.  She took [place] earlier this year and most recently has been started on Dupixent.  She has had 4 doses of Dupixent and continued Dexilant 60 mg daily.  Her esophageal symptoms including pyrosis is much better.  She is also had less of the choking sensation, less coughing and easier time with her breathing.  It has come to her attention at work that she has had a mold infestation in her immediate office.  This dates back about 18 months to when a lot of her coughing and congestion symptoms started.  She is also seen allergy and asthma and had a documented mold and mite allergy.  She is also had to have an increased dose of her Dulera inhaler.  She is working with her employer, [employer], on mitigation for the mold.   Review of Systems As per HPI, otherwise negative  Current Medications, Allergies, Past Medical History, Past Surgical History, Family History and Social History were reviewed in [employer] record.    Objective:   Physical Exam BP 100/70 (BP Location: Left Arm, Patient Position: Sitting, Cuff Size: Normal)   Pulse 84   Ht 5' 5.5" (1.664 m) Comment: height measured without shoes  Wt 195 lb 6 oz (88.6 kg)   LMP 07/24/2010   BMI 32.02 kg/m  Gen: awake, alert, NAD HEENT: anicteric  CV: RRR, no mrg Pulm: CTA b/l Abd: soft, NT/ND, +BS throughout Ext: no c/c/e Neuro: nonfocal  CMP     Component Value Date/Time   NA 141 03/29/2023 0959   NA 140 01/23/2023 0843   K 3.7 03/29/2023 0959    CL 101 03/29/2023 0959   CO2 31 03/29/2023 0959   GLUCOSE 113 (H) 03/29/2023 0959   BUN 14 03/29/2023 0959   BUN 18 01/23/2023 0843   CREATININE 0.79 03/29/2023 0959   CALCIUM 9.7 03/29/2023 0959   PROT 7.6 03/29/2023 0959   PROT 7.3 01/23/2023 0843   ALBUMIN 4.1 03/29/2023 0959   ALBUMIN 4.4 01/23/2023 0843   AST 28 03/29/2023 0959   ALT 32 03/29/2023 0959   ALKPHOS 126 (H) 03/29/2023 0959   BILITOT 0.5 03/29/2023 0959   BILITOT 0.4 01/23/2023 0843   GFR 81.77 03/29/2023 0959   EGFR 80 01/23/2023 0843   GFRNONAA 87 01/07/2021 1117   Lab Results  Component Value Date   INR 1.0 03/29/2023       Assessment & Plan:  62 year old female with a history of eosinophilic esophagitis, GERD, allergy/asthma, MASH, prior adenomatous colon polyps, hyperlipidemia who is seen for follow-up.  EoE/GERD with esophagitis --- she is doing better with Dupixent initiation over the last 4 weeks as well as PPI.  Vonoprazan was not well covered and so she is back with Dexilant 60 mg daily.  Currently good control of symptoms.  It is quite possible that her work exposure is driving some of her allergic type inflammation including EoE -- Continue Dupixent 300 mg weekly -- Continue Dexilant 60 mg daily  2.  MASH --continue vitamin E 800  IU daily.  AST and ALT recently normal.  INR normal.  3.  History of colonic polyps --surveillance colonoscopy recommended December 2026.  30 minutes total spent today including patient facing time, coordination of care, reviewing medical history/procedures/pertinent radiology studies, and documentation of the encounter.

## 2023-07-24 ENCOUNTER — Encounter: Payer: Self-pay | Admitting: Family

## 2023-07-24 ENCOUNTER — Ambulatory Visit: Payer: BC Managed Care – PPO | Admitting: Family

## 2023-07-24 VITALS — BP 131/76 | HR 82 | Temp 97.3°F | Ht 65.5 in | Wt 201.0 lb

## 2023-07-24 DIAGNOSIS — J4521 Mild intermittent asthma with (acute) exacerbation: Secondary | ICD-10-CM

## 2023-07-24 DIAGNOSIS — Z0001 Encounter for general adult medical examination with abnormal findings: Secondary | ICD-10-CM

## 2023-07-24 DIAGNOSIS — E669 Obesity, unspecified: Secondary | ICD-10-CM | POA: Diagnosis not present

## 2023-07-24 DIAGNOSIS — E559 Vitamin D deficiency, unspecified: Secondary | ICD-10-CM

## 2023-07-24 DIAGNOSIS — E7800 Pure hypercholesterolemia, unspecified: Secondary | ICD-10-CM

## 2023-07-24 DIAGNOSIS — R600 Localized edema: Secondary | ICD-10-CM | POA: Diagnosis not present

## 2023-07-24 DIAGNOSIS — Z0000 Encounter for general adult medical examination without abnormal findings: Secondary | ICD-10-CM | POA: Diagnosis not present

## 2023-07-24 DIAGNOSIS — K2100 Gastro-esophageal reflux disease with esophagitis, without bleeding: Secondary | ICD-10-CM

## 2023-07-24 DIAGNOSIS — J301 Allergic rhinitis due to pollen: Secondary | ICD-10-CM

## 2023-07-24 DIAGNOSIS — K219 Gastro-esophageal reflux disease without esophagitis: Secondary | ICD-10-CM

## 2023-07-24 MED ORDER — BACLOFEN 10 MG PO TABS: 10.0000 mg | ORAL_TABLET | Freq: Three times a day (TID) | ORAL | 0 refills | Status: DC

## 2023-07-24 MED ORDER — ALBUTEROL SULFATE HFA 108 (90 BASE) MCG/ACT IN AERS: INHALATION_SPRAY | RESPIRATORY_TRACT | 0 refills | Status: DC

## 2023-07-24 MED ORDER — ATORVASTATIN CALCIUM 10 MG PO TABS
10.0000 mg | ORAL_TABLET | Freq: Every day | ORAL | 2 refills | Status: DC
Start: 2023-07-24 — End: 2024-06-04

## 2023-07-24 MED ORDER — HYDROCHLOROTHIAZIDE 12.5 MG PO TABS
12.5000 mg | ORAL_TABLET | Freq: Every day | ORAL | 2 refills | Status: DC
Start: 2023-07-24 — End: 2024-01-24

## 2023-07-24 NOTE — Progress Notes (Signed)
Subjective:    Patient ID: Carla Morales, female    DOB: 27-Sep-1961, 62 y.o.   MRN: [id number]  Chief Complaint  Patient presents with   Medical Management of Chronic Issues    Exposed to mold in work place.    Pt presents to the office today for CPE and chronic follow up. She is followed by GYN annually.    She is followed by GI for GERD and EOE.  Asthma She complains of hoarse voice. There is no cough, shortness of breath or wheezing. This is a chronic problem. The current episode started more than 1 year ago. The problem occurs intermittently. Associated symptoms include heartburn. Her symptoms are alleviated by rest and beta-agonist. She reports moderate improvement on treatment. Her past medical history is significant for asthma.  Gastroesophageal Reflux She complains of belching, heartburn and a hoarse voice. She reports no coughing or no wheezing. This is a chronic problem. The current episode started more than 1 year ago. The problem occurs occasionally. Risk factors include obesity. She has tried a PPI for the symptoms. The treatment provided moderate relief.  Hyperlipidemia This is a chronic problem. The current episode started more than 1 year ago. The problem is controlled. Exacerbating diseases include obesity. Pertinent negatives include no shortness of breath. Current antihyperlipidemic treatment includes statins. The current treatment provides moderate improvement of lipids. Risk factors for coronary artery disease include dyslipidemia, hypertension, a sedentary lifestyle and post-menopausal.      Review of Systems  HENT:  Positive for hoarse voice.   Respiratory:  Negative for cough, shortness of breath and wheezing.   Gastrointestinal:  Positive for heartburn.  All other systems reviewed and are negative.  Family History  Problem Relation Age of Onset   Allergic rhinitis Mother    Asthma Mother    Rheum arthritis Mother    Fibromyalgia Mother    Cancer  Mother        breast, uterine, adenocarcinoma-right lung   Uterine cancer Mother        Sarcoma   Lung cancer Mother        Adenocarcinoma   Breast cancer Mother 65   Hypertension Father    Melanoma Father 5   Asthma Sister    Asthma Maternal Aunt    Diabetes Maternal Aunt    Cancer Maternal Aunt 61       GYN cancer - possibly vulvar?   Lung cancer Maternal Aunt    Diabetes Maternal Uncle    Appendicitis Maternal Uncle        d. 12   Diabetes Paternal Aunt    Breast cancer Paternal Aunt 70   Breast cancer Paternal Aunt 87   Uterine cancer Paternal Aunt 55   Breast cancer Paternal Aunt 54   Diabetes Paternal Uncle    Other Maternal Grandmother 30       childbirth   Heart disease Maternal Grandfather    Cancer Paternal Grandmother        unknown   Other Daughter        Phyllodes tumor   Other Nephew        Gilbert's Disease   Colon cancer Neg Hx    Esophageal cancer Neg Hx    Ulcerative colitis Neg Hx    Stomach cancer Neg Hx    Pancreatic cancer Neg Hx    Social History   Socioeconomic History   Marital status: Married    Spouse name: Not on file   Number  of children: 1   Years of education: Not on file   Highest education level: Not on file  Occupational History   Not on file  Tobacco Use   Smoking status: Never    Passive exposure: Never   Smokeless tobacco: Never  Vaping Use   Vaping status: Never Used  Substance and Sexual Activity   Alcohol use: No    Alcohol/week: 0.0 standard drinks of alcohol   Drug use: No   Sexual activity: Yes    Partners: Male    Birth control/protection: Surgical    Comment: hyst.-1st intercourse 61 yo-1 partner, menarche 16yo  Other Topics Concern   Not on file  Social History Narrative   Not on file   Social Determinants of Health   Financial Resource Strain: Not on file  Food Insecurity: Not on file  Transportation Needs: Not on file  Physical Activity: Not on file  Stress: Not on file  Social Connections: Not  on file        Objective:   Physical Exam Vitals reviewed.  Constitutional:      General: She is not in acute distress.    Appearance: She is well-developed. She is obese.  HENT:     Head: Normocephalic and atraumatic.     Right Ear: Tympanic membrane normal.     Left Ear: Tympanic membrane normal.  Eyes:     Pupils: Pupils are equal, round, and reactive to light.  Neck:     Thyroid: No thyromegaly.  Cardiovascular:     Rate and Rhythm: Normal rate and regular rhythm.     Heart sounds: Normal heart sounds. No murmur heard. Pulmonary:     Effort: Pulmonary effort is normal. No respiratory distress.     Breath sounds: Normal breath sounds. No wheezing.  Abdominal:     General: Bowel sounds are normal. There is no distension.     Palpations: Abdomen is soft.     Tenderness: There is no abdominal tenderness.  Musculoskeletal:        General: No tenderness. Normal range of motion.     Cervical back: Normal range of motion and neck supple.  Skin:    General: Skin is warm and dry.  Neurological:     Mental Status: She is alert and oriented to person, place, and time.     Cranial Nerves: No cranial nerve deficit.     Deep Tendon Reflexes: Reflexes are normal and symmetric.  Psychiatric:        Behavior: Behavior normal.        Thought Content: Thought content normal.        Judgment: Judgment normal.       BP 131/76   Pulse 82   Temp (!) 97.3 F (36.3 C) (Temporal)   Ht 5' 5.5" (1.664 m)   Wt 201 lb (91.2 kg)   LMP 07/24/2010   SpO2 98%   BMI 32.94 kg/m      Assessment & Plan:  Carla Morales comes in today with chief complaint of Medical Management of Chronic Issues (Exposed to mold in work place. )   Diagnosis and orders addressed:  1. Pure hypercholesterolemia - atorvastatin (LIPITOR) 10 MG tablet; Take 1 tablet (10 mg total) by mouth daily.  Dispense: 90 tablet; Refill: 2 - CBC with Differential/Platelet - CMP14+EGFR  2. Peripheral edema -  hydrochlorothiazide (HYDRODIURIL) 12.5 MG tablet; Take 1 tablet (12.5 mg total) by mouth daily.  Dispense: 90 tablet; Refill: 2 - CBC with  Differential/Platelet - CMP14+EGFR  3. Annual physical exam - CBC with Differential/Platelet - Lipid panel - CMP14+EGFR - TSH - VITAMIN D 25 Hydroxy (Vit-D Deficiency, Fractures)  4. Vitamin D deficiency - CBC with Differential/Platelet - CMP14+EGFR - VITAMIN D 25 Hydroxy (Vit-D Deficiency, Fractures)  5. Obesity (BMI 30-39.9) - CBC with Differential/Platelet - CMP14+EGFR  6. Gastroesophageal reflux disease, unspecified whether esophagitis present - CBC with Differential/Platelet - CMP14+EGFR  7. Mild intermittent asthma with acute exacerbation - CBC with Differential/Platelet - CMP14+EGFR  8. Gastroesophageal reflux disease with esophagitis without hemorrhage - CBC with Differential/Platelet - CMP14+EGFR  9. Acute seasonal allergic rhinitis due to pollen - albuterol (VENTOLIN HFA) 108 (90 Base) MCG/ACT inhaler; USE 2 INHALATIONS EVERY 6 HOURS AS NEEDED FOR WHEEZING OR SHORTNESS OF BREATH Need office visit for further refills  Dispense: 54 g; Refill: 0   Labs pending Health Maintenance reviewed Diet and exercise encouraged  Follow up plan: 6 months    Jannifer Rodney, FNP

## 2023-07-24 NOTE — Patient Instructions (Signed)

## 2023-07-25 LAB — CMP14+EGFR
ALT: 21 IU/L (ref 0–32)
AST: 17 IU/L (ref 0–40)
Albumin: 4.1 g/dL (ref 3.8–4.9)
Alkaline Phosphatase: 150 IU/L — ABNORMAL HIGH (ref 44–121)
BUN/Creatinine Ratio: 12 (ref 9–23)
BUN: 10 mg/dL (ref 6–24)
Bilirubin Total: 0.3 mg/dL (ref 0.0–1.2)
CO2: 24 mmol/L (ref 20–29)
Calcium: 9.6 mg/dL (ref 8.7–10.2)
Chloride: 107 mmol/L — ABNORMAL HIGH (ref 96–106)
Creatinine, Ser: 0.82 mg/dL (ref 0.57–1.00)
Globulin, Total: 2.6 g/dL (ref 1.5–4.5)
Glucose: 90 mg/dL (ref 70–99)
Potassium: 4.4 mmol/L (ref 3.5–5.2)
Sodium: 143 mmol/L (ref 134–144)
Total Protein: 6.7 g/dL (ref 6.0–8.5)
eGFR: 82 mL/min/{1.73_m2} (ref >59)

## 2023-07-25 LAB — LIPID PANEL
Chol/HDL Ratio: 2.9 ratio (ref 0.0–4.4)
Cholesterol, Total: 159 mg/dL (ref 100–199)
HDL: 54 mg/dL (ref >39)
LDL Chol Calc (NIH): 80 mg/dL (ref 0–99)
Triglycerides: 143 mg/dL (ref 0–149)
VLDL Cholesterol Cal: 25 mg/dL (ref 5–40)

## 2023-07-25 LAB — CBC WITH DIFFERENTIAL/PLATELET
Basophils Absolute: 0.1 10*3/uL (ref 0.0–0.2)
Basos: 1 %
EOS (ABSOLUTE): 0.3 10*3/uL (ref 0.0–0.4)
Eos: 3 %
Hematocrit: 38.3 % (ref 34.0–46.6)
Hemoglobin: 12.5 g/dL (ref 11.1–15.9)
Immature Grans (Abs): 0 10*3/uL (ref 0.0–0.1)
Immature Granulocytes: 1 %
Lymphocytes Absolute: 2.2 10*3/uL (ref 0.7–3.1)
Lymphs: 25 %
MCH: 30 pg (ref 26.6–33.0)
MCHC: 32.6 g/dL (ref 31.5–35.7)
MCV: 92 fL (ref 79–97)
Monocytes Absolute: 0.7 10*3/uL (ref 0.1–0.9)
Monocytes: 8 %
Neutrophils Absolute: 5.4 10*3/uL (ref 1.4–7.0)
Neutrophils: 62 %
Platelets: 274 10*3/uL (ref 150–450)
RBC: 4.16 x10E6/uL (ref 3.77–5.28)
RDW: 14.1 % (ref 11.7–15.4)
WBC: 8.6 10*3/uL (ref 3.4–10.8)

## 2023-07-25 LAB — VITAMIN D 25 HYDROXY (VIT D DEFICIENCY, FRACTURES): Vit D, 25-Hydroxy: 33.5 ng/mL (ref 30.0–100.0)

## 2023-07-25 LAB — TSH: TSH: 3.52 u[IU]/mL (ref 0.450–4.500)

## 2023-08-13 ENCOUNTER — Ambulatory Visit: Payer: BC Managed Care – PPO | Admitting: Internal Medicine

## 2023-08-13 ENCOUNTER — Other Ambulatory Visit: Payer: Self-pay

## 2023-08-13 ENCOUNTER — Encounter: Payer: Self-pay | Admitting: Internal Medicine

## 2023-08-13 VITALS — BP 120/88 | HR 98 | Temp 98.7°F | Ht 65.5 in | Wt 197.1 lb

## 2023-08-13 DIAGNOSIS — H1013 Acute atopic conjunctivitis, bilateral: Secondary | ICD-10-CM

## 2023-08-13 DIAGNOSIS — K200 Eosinophilic esophagitis: Secondary | ICD-10-CM | POA: Diagnosis not present

## 2023-08-13 DIAGNOSIS — J4530 Mild persistent asthma, uncomplicated: Secondary | ICD-10-CM | POA: Diagnosis not present

## 2023-08-13 DIAGNOSIS — Z77120 Contact with and (suspected) exposure to mold (toxic): Secondary | ICD-10-CM | POA: Insufficient documentation

## 2023-08-13 DIAGNOSIS — J3089 Other allergic rhinitis: Secondary | ICD-10-CM | POA: Diagnosis not present

## 2023-08-13 MED ORDER — DULERA 200-5 MCG/ACT IN AERO: 2.0000 | INHALATION_SPRAY | Freq: Two times a day (BID) | RESPIRATORY_TRACT | 5 refills | Status: DC

## 2023-08-13 MED ORDER — FLUTICASONE PROPIONATE 50 MCG/ACT NA SUSP
2.0000 | Freq: Every day | NASAL | 6 refills | Status: DC
Start: 2023-08-13 — End: 2024-02-11

## 2023-08-13 MED ORDER — IPRATROPIUM BROMIDE 0.03 % NA SOLN: NASAL | 5 refills | Status: DC

## 2023-08-13 NOTE — Addendum Note (Signed)
Addended by: Philipp Deputy on: 08/13/2023 09:59 AM   Modules accepted: Orders

## 2023-08-13 NOTE — Patient Instructions (Addendum)
Asthma  Dulera 200 mcg 2 puffs twice daily.  Continue albuterol 2 puffs once every 4 hours as needed for cough or wheeze You may use albuterol 2 puffs 5 to 15 minutes before activity to decrease cough or wheeze  Continue to rinse and brush teeth after using inhaler. Use with spacer.   Allergic rhinitis Continue allergen avoidance measures directed toward dust mite and mold as listed below Consider allergy injections Continue Xyzal 5 mg once a day as needed for runny nose or itch Continue Flonase 2 sprays in each nostril once a day as needed for stuffy nose Continue azelastine 2 sprays in each nostril up to twice a day as needed for runny nose Consider saline nasal rinses as needed for nasal symptoms. Use this before any medicated nasal sprays for best result  Allergic conjunctivitis Some over the counter eye drops include Pataday one drop in each eye once a day as needed for red, itchy eyes OR Zaditor one drop in each eye twice a day as needed for red itchy eyes. Avoid eye drops that say red eye relief as they may contain medications that dry out your eyes.   Pruritus Continue Xyzal 5 mg once a day as needed for itch Continue hydroxyzine as needed for nighttime itch  Reflux and dysphagia/EoE - continue medications as prescribed by GI - continue Dupixent weekly (prescribed by GI)   Call the clinic if this treatment plan is not working well for you  Follow up in the clinic in 6 months, sooner if needed. Will consider stepping down Dulera at that visit if doing well.   -------------------------------------------------------------------------------------------------- Control of Dust Mite Allergen Dust mites play a major role in allergic asthma and rhinitis. They occur in environments with high humidity wherever human skin is found. Dust mites absorb humidity from the atmosphere (ie, they do not drink) and feed on organic matter (including shed human and animal skin). Dust mites are a  microscopic type of insect that you cannot see with the naked eye. High levels of dust mites have been detected from mattresses, pillows, carpets, upholstered furniture, bed covers, clothes, soft toys and any woven material. The principal allergen of the dust mite is found in its feces. A gram of dust may contain 1,000 mites and 250,000 fecal particles. Mite antigen is easily measured in the air during house cleaning activities. Dust mites do not bite and do not cause harm to humans, other than by triggering allergies/asthma.  Ways to decrease your exposure to dust mites in your home:  1. Encase mattresses, box springs and pillows with a mite-impermeable barrier or cover  2. Wash sheets, blankets and drapes weekly in hot water (130 F) with detergent and dry them in a dryer on the hot setting.  3. Have the room cleaned frequently with a vacuum cleaner and a damp dust-mop. For carpeting or rugs, vacuuming with a vacuum cleaner equipped with a high-efficiency particulate air (HEPA) filter. The dust mite allergic individual should not be in a room which is being cleaned and should wait 1 hour after cleaning before going into the room.  4. Do not sleep on upholstered furniture (eg, couches).  5. If possible removing carpeting, upholstered furniture and drapery from the home is ideal. Horizontal blinds should be eliminated in the rooms where the person spends the most time (bedroom, study, television room). Washable vinyl, roller-type shades are optimal.  6. Remove all non-washable stuffed toys from the bedroom. Wash stuffed toys weekly like sheets and blankets  above.  7. Reduce indoor humidity to less than 50%. Inexpensive humidity monitors can be purchased at most hardware stores. Do not use a humidifier as can make the problem worse and are not recommended.  Control of Mold Allergen Mold and fungi can grow on a variety of surfaces provided certain temperature and moisture conditions exist.  Outdoor  molds grow on plants, decaying vegetation and soil.  The major outdoor mold, Alternaria and Cladosporium, are found in very high numbers during hot and dry conditions.  Generally, a late Summer - Fall peak is seen for common outdoor fungal spores.  Rain will temporarily lower outdoor mold spore count, but counts rise rapidly when the rainy period ends.  The most important indoor molds are Aspergillus and Penicillium.  Dark, humid and poorly ventilated basements are ideal sites for mold growth.  The next most common sites of mold growth are the bathroom and the kitchen.  Outdoor [employer] Use air conditioning and keep windows closed Avoid exposure to decaying vegetation. Avoid leaf raking. Avoid grain handling. Consider wearing a face mask if working in moldy areas.  Indoor Mold Control Maintain humidity below 50%. Clean washable surfaces with 5% bleach solution. Remove sources e.g. Contaminated carpets.

## 2023-08-13 NOTE — Progress Notes (Signed)
FOLLOW UP Date of Service/Encounter:  08/13/23  Subjective:  Carla Morales (DOB: 05-May-1961) is a 62 y.o. female who [facility] in re-evaluation of the following:  asthma, allergic rhinitis, pruritus  History obtained from: chart review and patient.  For Review, LV was on 05/09/23  with Dr.Partick Musselman seen for routine follow-up. See below for summary of history and diagnostics.   Therapeutic plans/changes recommended: FEV1 83%, Dulera increased to 200 mcg 2 puffs BID. ----------------------------------------------------- Pertinent History/Diagnostics:  Asthma: Diagnosed as an adult. Sx: SOB, moderate exercise intolerance. 2-3 courses systemic steroids in 2023-2024. No prior hospitalizations. Triggers: exercise and respiratory illness, symptoms worsened after COVID infections in 2020 and 2021. No smoke exposure. QVAR gave her oral thrush.  -  AEC (07/13/22): 200 -  CXR on (06/282024): normal - normal spirometry (03/28/23): ratio 0.86, 92% FEV1 Allergic Rhinitis:  hoarse voice, feeling something is stuck in her throat, eyes watery, feel blurry like a film over them, runny nose, generalized pruritus Occurs year-round with seasonal flares-spring and fall Has reflux. Follows with GI on dexilant recently switched to vonoprazan (03/29/23), scope concerning for EoE without histologic diagnosis. Chokes on dry meats. Has had 2 esophageal dilations. S/p tonsillectomy. - SPT environmental panel (03/28/23): skin testing borderline to dust mites, intradermals positive to minor molds  - SPT most common food allergens + potatoes, chicken, pork, beef and pineapple (03/28/23) negative Generalized pruritus:  NASH present, no rash unless scratches.  --------------------------------------------------- Today presents for follow-up. Discussed the use of AI scribe software for clinical note transcription with the patient, who gave verbal consent to  proceed.  History of Present Illness   The patient, with a history of asthma and Eosinophilic Esophagitis (EOE), has been on Dupixent for approximately two months, which she believes has improved her condition of both dysphagia and asthma. She administers the medication weekly and has noticed a decrease in the need for albuterol. She continues to use Dulera 200, two puffs twice a day.  The patient also reports a potential exposure to mold at her workplace, where she noticed debris from the ceiling, including dead ants and swarm ants. This was the same location where she had a water leak the previous year. The building underwent testing and remediation, including removal of wallpaper and painting. However, the patient's office, which is isolated with no airflow, still had debris falling from the ceiling. The patient has a history of positive reactions to minor molds, which could potentially exacerbate her asthma symptoms.  The patient also reports increased anxiety and depression due to the situation at work and the potential health implications. She has been with the company for almost 41 years and is currently dealing with workers' compensation issues related to the mold exposure.  She continues to take Dexilant for her EOE and Xyzal for itching. The patient's swallowing has improved, and she has not reported any new symptoms.         Chart Review: 07/18/23: GI visit-now on Dupixent for EoE (weekly dosing) and dexilant 60 mg daily  All medications reviewed by clinical staff and updated in chart. No new pertinent medical or surgical history except as noted in HPI.  ROS: All others negative except as noted per HPI.   Objective:  BP 120/88   Pulse 98   Temp 98.7 F (37.1 C)   Ht 5' 5.5" (1.664 m)   Wt 197 lb 1.6 oz (89.4 kg)   LMP 07/24/2010   SpO2 97%  BMI 32.30 kg/m  Body mass index is 32.3 kg/m. Physical Exam: General Appearance:  Alert, cooperative, no distress, appears stated  age  Head:  Normocephalic, without obvious abnormality, atraumatic  Eyes:  Conjunctiva clear, EOM's intact  Ears EACs normal bilaterally and normal TMs bilaterally  Nose: Nares normal, hypertrophic turbinates, normal mucosa, and no visible anterior polyps  Throat: Lips, tongue normal; teeth and gums normal, normal posterior oropharynx  Neck: Supple, symmetrical  Lungs:   clear to auscultation bilaterally, Respirations unlabored, no coughing  Heart:  regular rate and rhythm and no murmur, Appears well perfused  Extremities: No edema  Skin: Skin color, texture, turgor normal and no rashes or lesions on visualized portions of skin  Neurologic: No gross deficits   Labs:  Lab Orders  No laboratory test(s) ordered today    Spirometry:  Tracings reviewed. Her effort: Good reproducible efforts. FVC: 2.80L FEV1: 2.43L, 91% predicted FEV1/FVC ratio: 0.87 Interpretation: Spirometry consistent with normal pattern.  Please see scanned spirometry results for details.  Assessment/Plan   Asthma  Improved control with Dupixent and Dulera 200, 2 puffs twice daily. Albuterol use decreased. Possible exacerbation due to mold exposure at work Dulera 200 mcg 2 puffs twice daily.  Continue albuterol 2 puffs once every 4 hours as needed for cough or wheeze You may use albuterol 2 puffs 5 to 15 minutes before activity to decrease cough or wheeze  Continue to rinse and brush teeth after using inhaler. Use with spacer.   Allergic rhinitis Controlled with daily Xyzal. Continue allergen avoidance measures directed toward dust mite and mold as listed below Consider allergy injections Continue Xyzal 5 mg once a day as needed for runny nose or itch Continue Flonase 2 sprays in each nostril once a day as needed for stuffy nose Continue azelastine 2 sprays in each nostril up to twice a day as needed for runny nose Consider saline nasal rinses as needed for nasal symptoms. Use this before any medicated nasal  sprays for best result  Allergic conjunctivitis Some over the counter eye drops include Pataday one drop in each eye once a day as needed for red, itchy eyes OR Zaditor one drop in each eye twice a day as needed for red itchy eyes. Avoid eye drops that say red eye relief as they may contain medications that dry out your eyes.   Pruritus Continue Xyzal 5 mg once a day as needed for itch Continue hydroxyzine as needed for nighttime itch  EoE Confirmed diagnosis with biopsy while on Dexilant. Improvement in swallowing noted on dupixent. - continue medications as prescribed by GI - continue Dupixent weekly (prescribed by GI)  Workplace Mold Exposure Possible exacerbation of asthma symptoms due to mold exposure at work. Patient is undergoing a process with her employer regarding this issue. -If needed, provide documentation regarding patient's mold allergy and potential for exacerbation of asthma symptoms with mold exposure.  Call the clinic if this treatment plan is not working well for you  Follow up in the clinic in 6 months, sooner if needed. Will consider stepping down Dulera at that visit if doing well.   Other: none  Tonny Bollman, MD  [facility]

## 2023-08-15 DIAGNOSIS — M9902 Segmental and somatic dysfunction of thoracic region: Secondary | ICD-10-CM | POA: Diagnosis not present

## 2023-08-15 DIAGNOSIS — M9903 Segmental and somatic dysfunction of lumbar region: Secondary | ICD-10-CM | POA: Diagnosis not present

## 2023-08-15 DIAGNOSIS — M9901 Segmental and somatic dysfunction of cervical region: Secondary | ICD-10-CM | POA: Diagnosis not present

## 2023-08-15 DIAGNOSIS — M5412 Radiculopathy, cervical region: Secondary | ICD-10-CM | POA: Diagnosis not present

## 2023-09-02 IMAGING — MG MM DIGITAL SCREENING BILAT W/ TOMO AND CAD
8 series · 9 of 24 positions shown · non-contrast
Comparison: Previous exam(s).

CLINICAL DATA: Screening.

EXAM:
DIGITAL SCREENING BILATERAL MAMMOGRAM WITH TOMOSYNTHESIS AND CAD
TECHNIQUE: Bilateral screening digital craniocaudal and mediolateral oblique
mammograms were obtained. Bilateral screening digital breast
tomosynthesis was performed. The images were evaluated with
computer-aided detection.

[L MLO synth-2D]
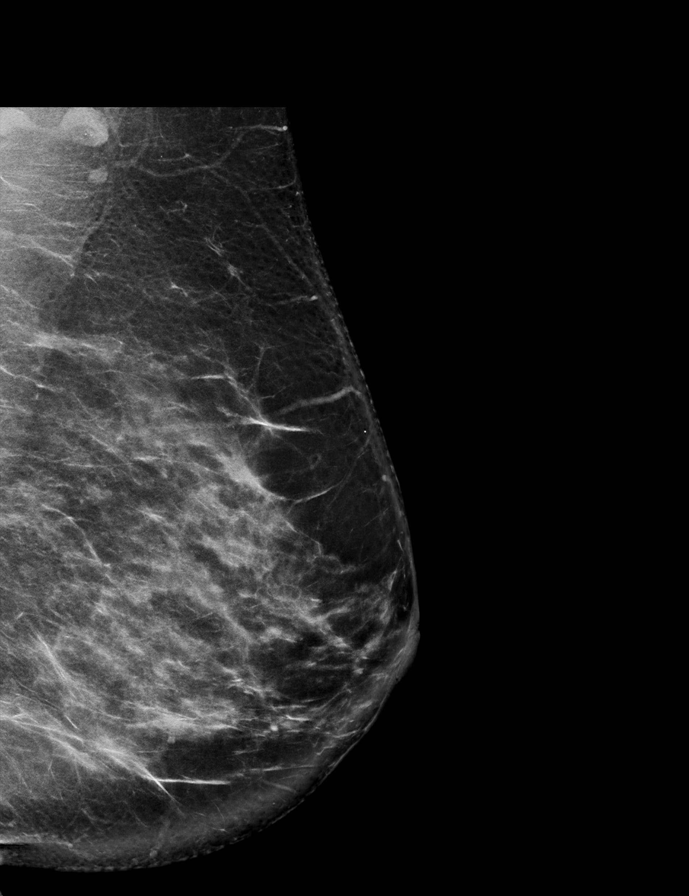

[R CC synth-2D]
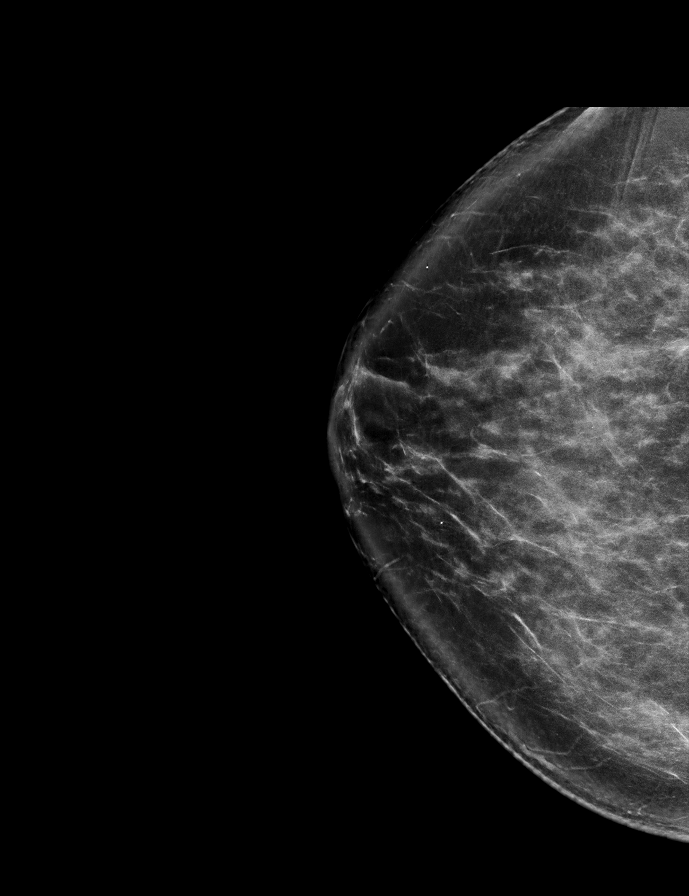

[L CC synth-2D]
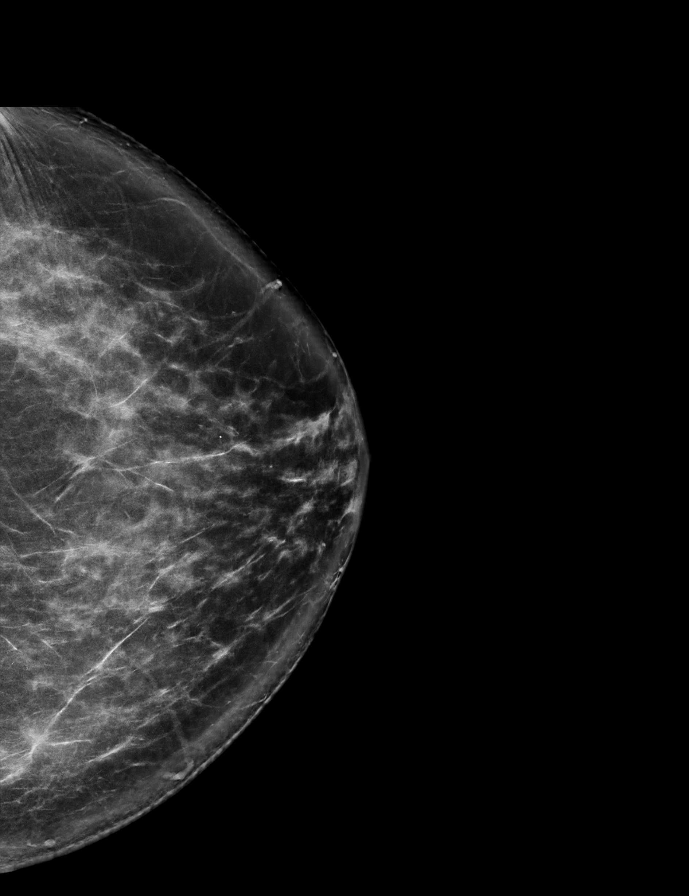

[R MLO synth-2D]
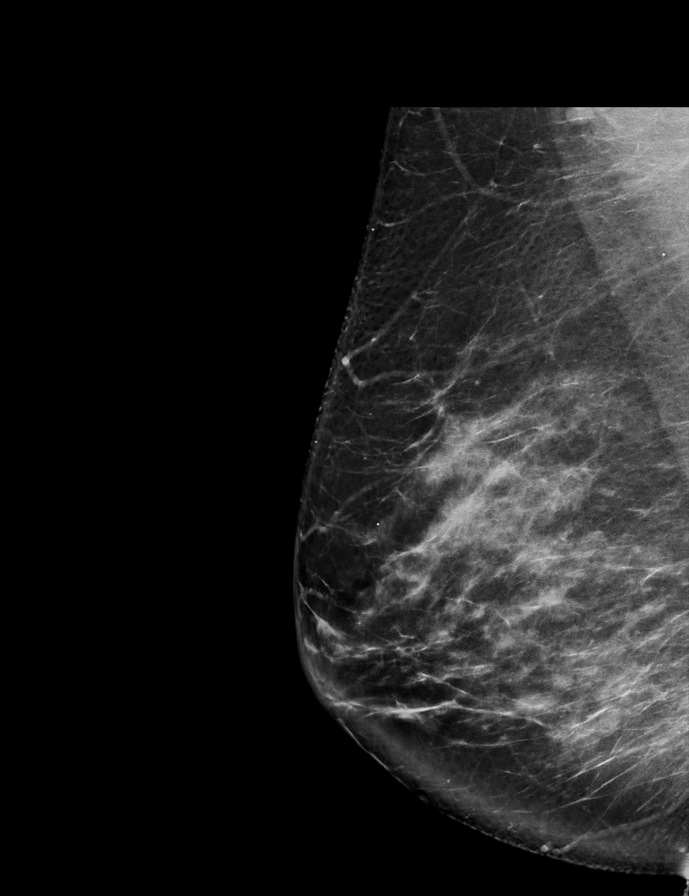

[R MLO tomo · 2 of 91 frames shown]
[frame 30/91]
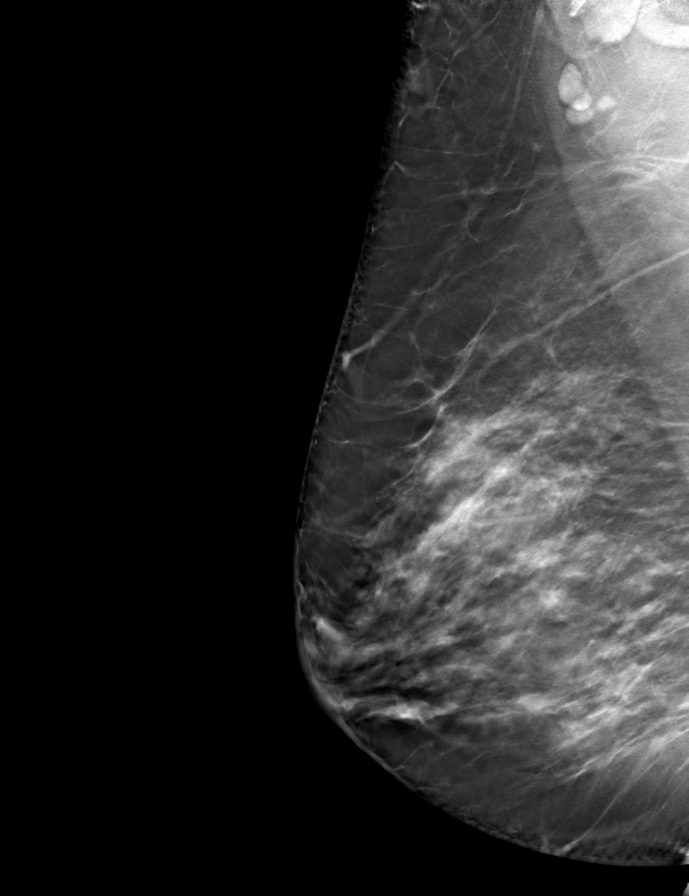
[frame 46/91]
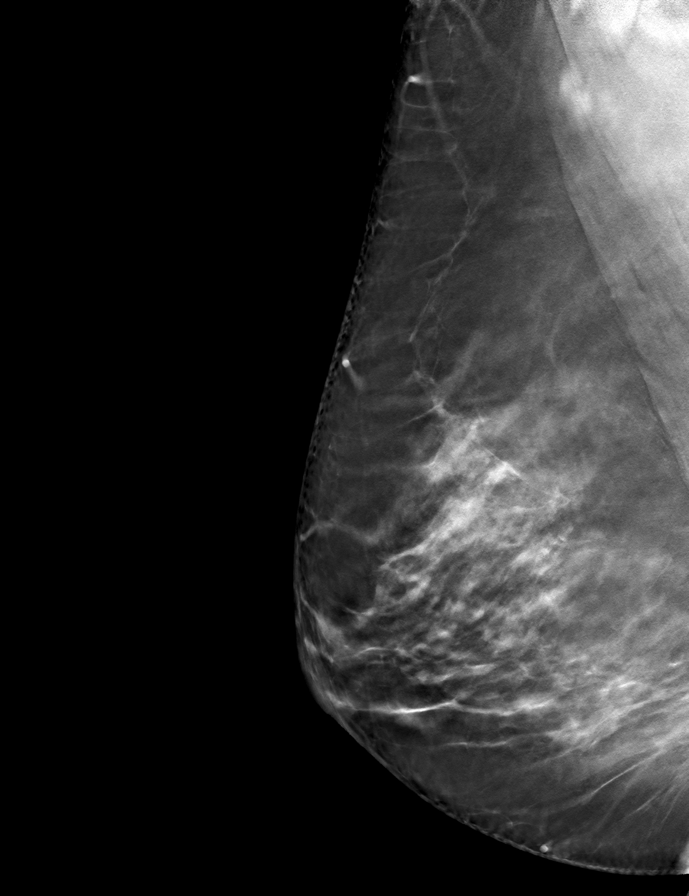

[L CC tomo · tomo slice 42/83.0]
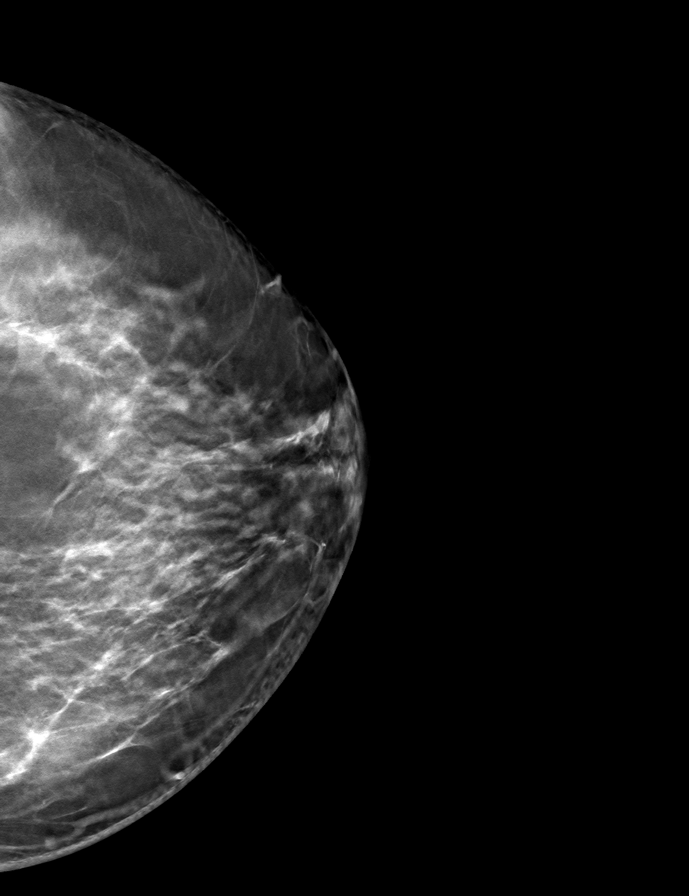

[R CC tomo · tomo slice 45/88.0]
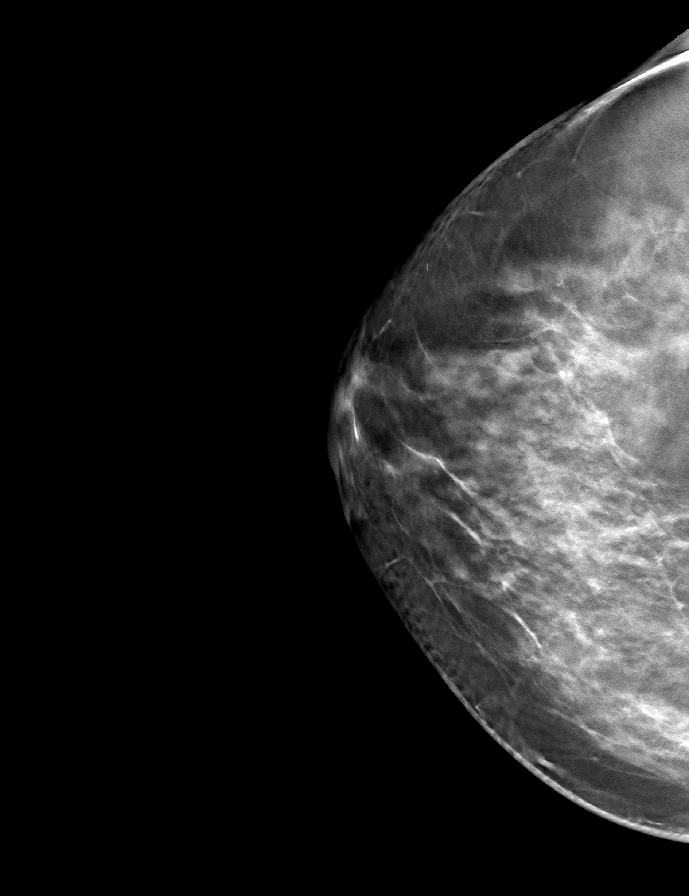

[L MLO tomo · tomo slice 44/87.0]
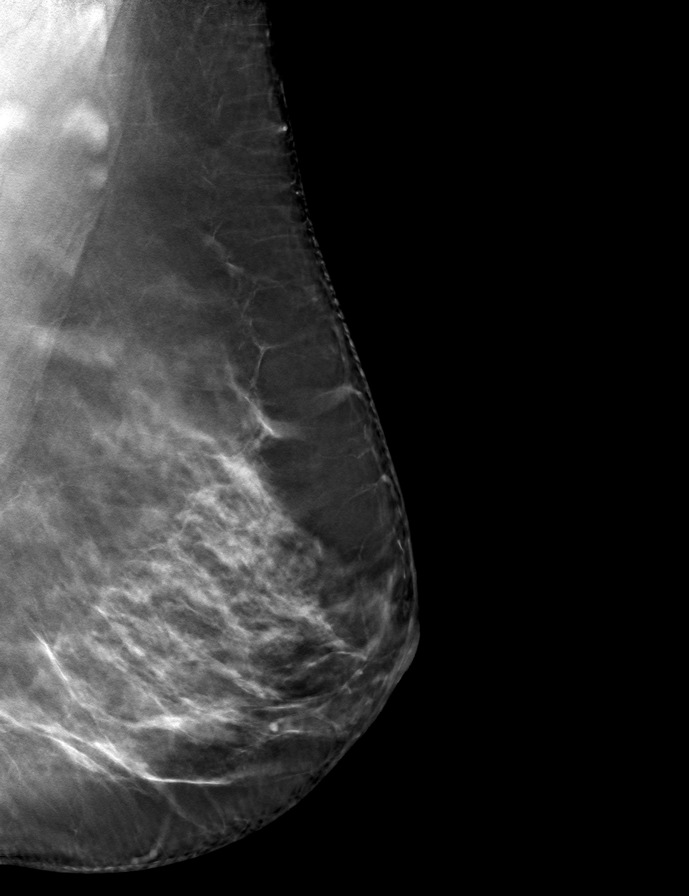

[9 of 24 positions shown; findings below may reference images not displayed]

ACR Breast Density Category c: The breast tissue is heterogeneously
dense, which may obscure small masses.
FINDINGS: There are no findings suspicious for malignancy.
IMPRESSION: No mammographic evidence of malignancy. A result letter of this
screening mammogram will be mailed directly to the patient.

RECOMMENDATION:
Screening mammogram in one year. (Code:Q3-W-BC3)

BI-RADS CATEGORY  1: Negative.

## 2023-09-12 DIAGNOSIS — M9901 Segmental and somatic dysfunction of cervical region: Secondary | ICD-10-CM | POA: Diagnosis not present

## 2023-09-12 DIAGNOSIS — M9902 Segmental and somatic dysfunction of thoracic region: Secondary | ICD-10-CM | POA: Diagnosis not present

## 2023-09-12 DIAGNOSIS — M62830 Muscle spasm of back: Secondary | ICD-10-CM | POA: Diagnosis not present

## 2023-09-12 DIAGNOSIS — M9903 Segmental and somatic dysfunction of lumbar region: Secondary | ICD-10-CM | POA: Diagnosis not present

## 2023-09-18 ENCOUNTER — Encounter: Payer: Self-pay | Admitting: Family Medicine

## 2023-09-18 ENCOUNTER — Ambulatory Visit: Payer: BC Managed Care – PPO | Admitting: Family Medicine

## 2023-09-18 VITALS — BP 106/84 | Ht 65.0 in | Wt 194.0 lb

## 2023-09-18 DIAGNOSIS — M1712 Unilateral primary osteoarthritis, left knee: Secondary | ICD-10-CM | POA: Insufficient documentation

## 2023-09-18 MED ORDER — METHYLPREDNISOLONE ACETATE 80 MG/ML IJ SUSP
80.0000 mg | Freq: Once | INTRAMUSCULAR | Status: AC
Start: 2023-09-18 — End: 2023-09-18
  Administered 2023-09-18: 80 mg via INTRA_ARTICULAR

## 2023-09-18 NOTE — Patient Instructions (Signed)
Today you received an injection with a corticosteroid. This injection is usually done in response to pain and inflammation. There is some "numbing medicine" (Lidocaine) in the shot, so the injected area may be numb and feel really good for the next couple of hours. The numbing medicine usually wears off in 2-3 hours, and then your pain level may be back to where it was before the injection until the steroid starts working.    You may actually experience a small (as in 10%) INCREASE in pressure sensation in the first 24 hours---that is common.  Things to watch out for that you should contact us or a health care provider urgently would include: 1. Unusual (as in more than 10%) increase in pain 2. New fever > 101.5 3. New swelling or redness of the injected area. 4. Streaking of red lines around the area injected.  Do not hesitate to call or reach out with any questions or concerns.

## 2023-09-18 NOTE — Assessment & Plan Note (Addendum)
-   The patient's history and exam findings are consistent with osteoarthritis - Her previous x-rays were reviewed with her from 06/28/2023.  Images personally interpreted showing medial joint space narrowing and some medial and lateral chondrocalcinosis. -The patient is unable to take oral anti-inflammatories due to gastric reflux and some kidney disease. - Joint decision-making performed today and patient would like to proceed with steroid injection given her level of pain and inability to perform strengthening previously.  -We also discussed the use of topical Voltaren gel and ice. - Body helix sleeve fitted today for support and comfort.  Printed materials given for home quadricep strengthening exercises. - We will follow-up in 4-6 weeks.  If her symptoms have completely resolved she can message [place] and cancel.  PROCEDURE: Risks and benefits of procedure were discussed with patient. After informed written consent was obtained, timeout was performed, patient was seated on exam table.  The left knee was prepped with alcohol swab and ethyl chloride spray.  Utilizing anterior lateral approach, patient's right knee was injected intraarticularly with 3:1 lidocaine 1% without IHK:[id number] 80mg /ml. Patient tolerated the procedure well without immediate complications.  Postprocedural care information given.

## 2023-09-18 NOTE — Progress Notes (Signed)
Carla Morales - 62 y.o. female MRN [id number]  Date of birth: Jul 21, 1961  PCP: Junie Spencer, FNP  Subjective:  No chief complaint on file. Left knee pain  HPI: Past Medical, Surgical, Social, and Family History Reviewed & Updated per EMR.   Patient is a 62 y.o. female here for pain in the left knee that started several months ago.  She was seen in August for swelling and increased pain for which she was given anti-inflammatories and an Ace wrap.  This resolved her swelling and most of the pain but she periodically has aching pain in the medial left knee which sometimes causes her knee to give out.  Most recently she fell while carrying her grandson because her knee gave out but she did not land on it.  She did play sports as a child and young adult including softball and basketball but did not have any specific knee injuries.  She has some popping and clicking intermittently but no locking of the knee.  She has not had any recent fever, chills, knee swelling or warmth, distal numbness or weakness.  She does not take oral anti-inflammatories due to kidney disease and gastric reflux.  She has tried some strengthening of the quad muscles in the past but has been limited due to pain.  Pertinent PMHx: Contrast dye allergy, GERD  Past Surgical History:  Procedure Laterality Date   ABDOMINAL HYSTERECTOMY     bladder dialation  1968   as a child   COLONOSCOPY     TONSILLECTOMY  1971   TOTAL VAGINAL HYSTERECTOMY  07/2010   menorrhagia, leiomyoma, prolapse   TUBAL LIGATION  1998    Allergies  Allergen Reactions   Other Swelling    Contrast dye   Contrast Media [Iodinated Contrast Media]    Dust Mite Extract    Haldol [Haloperidol Decanoate]     anxiety   Molds & Smuts    Pravastatin Other (See Comments)    myalgias   Sulfa Antibiotics Rash        Objective:  Physical Exam: VS: BP:106/84  HR: bpm  TEMP: ( )  RESP:   HT:5\' 5"  (165.1 cm)   WT:194 lb (88 kg)   BMI:32.28  Gen: NAD, speaks clearly, comfortable in exam room Respiratory: normal work of breathing on room air Skin: No rashes, abrasions, or ecchymosis MSK:  Inspection of the left knee shows some mild soft tissue edema on the medial aspect of the joint line adjacent to the patellar ligament. No overlying erythema, warmth, ecchymosis, and no effusion present. Range of motion is full in flexion and extension with some crepitus palpated Joint line tenderness over the medial greater than lateral aspect Mild tenderness to to palpation over the medial femoral condyle Patellar translation nontender McMurray's equivocal Lachman's negative Varus and valgus stress with good endpoints    Assessment & Plan:   Osteoarthritis of left knee - The patient's history and exam findings are consistent with osteoarthritis - Her previous x-rays were reviewed with her from 06/28/2023.  Images personally interpreted showing medial joint space narrowing and some medial and lateral chondrocalcinosis. -The patient is unable to take oral anti-inflammatories due to gastric reflux and some kidney disease. - Joint decision-making performed today and patient would like to proceed with steroid injection given her level of pain and inability to perform strengthening previously.  -We also discussed the use of topical Voltaren gel and ice. - Body helix sleeve fitted today for support and comfort.  Printed  materials given for home quadricep strengthening exercises. - We will follow-up in 4-6 weeks.  If her symptoms have completely resolved she can message [place] and cancel.  PROCEDURE: Risks and benefits of procedure were discussed with patient. After informed written consent was obtained, timeout was performed, patient was seated on exam table.  The left knee was prepped with alcohol swab and ethyl chloride spray.  Utilizing anterior lateral approach, patient's right knee was injected intraarticularly with 3:1 lidocaine 1%  without UJW:[id number] 80mg /ml. Patient tolerated the procedure well without immediate complications.  Postprocedural care information given.    [name] [facility] Fellow  Addendum:  Patient seen and examined in the office with fellow.  History, exam, plan of care were precepted with me.  I was present and assisted with injection.  Agree with findings as noted in Fellow note above.  Darene Lamer, DO, CAQSM

## 2023-10-09 ENCOUNTER — Encounter: Payer: Self-pay | Admitting: Family Medicine

## 2023-10-09 ENCOUNTER — Ambulatory Visit (INDEPENDENT_AMBULATORY_CARE_PROVIDER_SITE_OTHER): Payer: BC Managed Care – PPO | Admitting: Family Medicine

## 2023-10-09 VITALS — BP 118/70 | HR 77 | Temp 97.8°F | Resp 16

## 2023-10-09 DIAGNOSIS — L299 Pruritus, unspecified: Secondary | ICD-10-CM

## 2023-10-09 DIAGNOSIS — K200 Eosinophilic esophagitis: Secondary | ICD-10-CM | POA: Diagnosis not present

## 2023-10-09 DIAGNOSIS — K2100 Gastro-esophageal reflux disease with esophagitis, without bleeding: Secondary | ICD-10-CM

## 2023-10-09 DIAGNOSIS — H1013 Acute atopic conjunctivitis, bilateral: Secondary | ICD-10-CM

## 2023-10-09 DIAGNOSIS — J3089 Other allergic rhinitis: Secondary | ICD-10-CM | POA: Diagnosis not present

## 2023-10-09 DIAGNOSIS — J4530 Mild persistent asthma, uncomplicated: Secondary | ICD-10-CM | POA: Diagnosis not present

## 2023-10-09 DIAGNOSIS — M62830 Muscle spasm of back: Secondary | ICD-10-CM | POA: Diagnosis not present

## 2023-10-09 DIAGNOSIS — K121 Other forms of stomatitis: Secondary | ICD-10-CM

## 2023-10-09 DIAGNOSIS — M9902 Segmental and somatic dysfunction of thoracic region: Secondary | ICD-10-CM | POA: Diagnosis not present

## 2023-10-09 DIAGNOSIS — M9903 Segmental and somatic dysfunction of lumbar region: Secondary | ICD-10-CM | POA: Diagnosis not present

## 2023-10-09 DIAGNOSIS — M9901 Segmental and somatic dysfunction of cervical region: Secondary | ICD-10-CM | POA: Diagnosis not present

## 2023-10-09 MED ORDER — LIDOCAINE VISCOUS HCL 2 % MT SOLN: 15.0000 mL | Freq: Four times a day (QID) | OROMUCOSAL | 1 refills | Status: DC | PRN

## 2023-10-09 NOTE — Progress Notes (Signed)
[address] [place] [address] Dept: [phone]  FOLLOW UP NOTE  Patient ID: Carla Morales, female    DOB: 08/20/1961  Age: 62 y.o. MRN: [id number] Date of Office Visit: 10/09/2023  Assessment  Chief Complaint: Allergies (Hoarse and red painful spot on roof of mouth that she noticed on Saturday )  HPI Carla Morales is a 62 year old female who presents to the clinic for evaluation of a red spot in her mouth.  She was last seen in this clinic on 08/13/2023 by Dr. Maurine Minister for evaluation of asthma, allergic rhinitis, allergic conjunctivitis, pruritus, and EOE on Dupixent for which she follows up with her GI specialist.    In the interim, she reports that her building is undergoing mold remediation procedures including chemical treatments which she reports has flared her asthma.  About the same time, she noted that the top of her mouth felt sore (Saturday).  Using a flashlight she looked at the top of her mouth and noted a small red area.  She reports this area grew in size over the net few days and became more painful each day.  She reports that she is currently unable to eat solid foods and is eating pure or drinking liquids at this time.  She does not have a history of drinking alcohol or using nicotine products.  She denies recent fever, sweats, chills, or sick contacts.    Asthma is reported as moderately well-controlled with recent symptoms including chest tightness, shortness of breath, and cough producing clear mucus.  She continues Dulera 200-2 puffs twice a day with a spacer and rarely needs to use albuterol for relief of symptoms.  She does report that she used albuterol several times last week for relief of asthma symptoms.    Allergic rhinitis is reported as moderately well-controlled with nasal congestion Carla Morales headache that occurred last week.  More constant symptoms include occasional sneezing and postnasal drainage.  She continues Xyzol 5 mg once a day, Flonase  daily, ipratropium daily, and azelastine daily.  She frequently uses nasal saline rinses which she reports are very effective.  Her last environmental allergy skin testing was on 03/28/2023 and was positive to dust mite and mold.   Allergic conjunctivitis is reported as moderately well-controlled with occasional itching and redness occurring only last week.  She continues olopatadine with relief of symptoms.   She continues to follow up with her GI specialist for treatment of EOE.  She continues Dupixent weekly with excellent relief of EOE symptoms.  She is not currently avoiding any foods.  Her last selected food allergy skin testing was on 03/28/2023 and was negative to foods tested.  She reports occasional pruritus for which she continues Xyzol in the evening and rarely uses hydroxyzine for relief of symptoms.  Her current medications are listed in the chart.   Drug Allergies:  Allergies  Allergen Reactions   Other Swelling    Contrast dye   Contrast Media [Iodinated Contrast Media]    Dust Mite Extract    Haldol [Haloperidol Decanoate]     anxiety   Molds & Smuts    Pravastatin Other (See Comments)    myalgias   Sulfa Antibiotics Rash    Physical Exam: BP 118/70   Pulse 77   Temp 97.8 F (36.6 C) (Temporal)   Resp 16   LMP 07/24/2010   SpO2 98%    Physical Exam Vitals reviewed.  Constitutional:      Appearance: Normal appearance.  HENT:  Head: Normocephalic and atraumatic.     Right Ear: Tympanic membrane normal.     Left Ear: Tympanic membrane normal.     Nose:     Comments: Bilateral nares normal.  Pharynx normal.  Ears normal.  Eyes normal.  Denuded appearing area on the top of her mouth. Eyes:     Conjunctiva/sclera: Conjunctivae normal.  Cardiovascular:     Rate and Rhythm: Normal rate and regular rhythm.     Heart sounds: Normal heart sounds. No murmur heard. Pulmonary:     Effort: Pulmonary effort is normal.     Breath sounds: Normal breath sounds.      Comments: Lungs clear to auscultation Musculoskeletal:        General: Normal range of motion.     Cervical back: Normal range of motion and neck supple.  Skin:    General: Skin is warm and dry.  Neurological:     Mental Status: She is alert and oriented to person, place, and time.  Psychiatric:        Mood and Affect: Mood normal.        Behavior: Behavior normal.        Thought Content: Thought content normal.        Judgment: Judgment normal.     Picture of the top of her mouth using a mirror   Assessment and Plan: 1. Mild persistent asthma without complication [J45.30]   2. Perennial allergic rhinitis   3. Allergic conjunctivitis of both eyes   4. Eosinophilic esophagitis   5. Pruritus   6. Gastroesophageal reflux disease with esophagitis without hemorrhage   7. Mouth ulcer     Meds ordered this encounter  Medications   lidocaine (XYLOCAINE) 2 % solution    Sig: Use as directed 15 mLs in the mouth or throat every 6 (six) hours as needed for mouth pain.    Dispense:  100 mL    Refill:  1    Patient Instructions  Asthma  Dulera 200 mcg 2 puffs twice daily.  Continue albuterol 2 puffs once every 4 hours as needed for cough or wheeze You may use albuterol 2 puffs 5 to 15 minutes before activity to decrease cough or wheeze  Continue to rinse and brush teeth after using inhaler. Use with spacer.   Allergic rhinitis Continue allergen avoidance measures directed toward dust mite and mold as listed below Consider allergy injections Continue Xyzal 5 mg once a day as needed for runny nose or itch Continue Flonase 2 sprays in each nostril once a day as needed for stuffy nose Continue azelastine 2 sprays in each nostril up to twice a day as needed for runny nose Consider saline nasal rinses as needed for nasal symptoms. Use this before any medicated nasal sprays for best result  Allergic conjunctivitis Some over the counter eye drops include Pataday one drop in each eye  once a day as needed for red, itchy eyes OR Zaditor one drop in each eye twice a day as needed for red itchy eyes. Avoid eye drops that say red eye relief as they may contain medications that dry out your eyes.   Pruritus Continue Xyzal 5 mg once a day as needed for itch Continue hydroxyzine as needed for nighttime itch  Reflux and dysphagia/EoE - continue medications as prescribed by GI - continue Dupixent weekly (prescribed by GI)  Ulcer in mouth Refer to ENT May use viscous lidocaine- swish and spit once every 6-8 hours  Call the  clinic if this treatment plan is not working well for you  Follow up in the clinic in 1 month, sooner if needed.    Return in about 4 weeks (around 11/06/2023), or if symptoms worsen or fail to improve.    Thank you for the opportunity to care for this patient.  Please do not hesitate to contact me with questions.  Thermon Leyland, FNP [facility]

## 2023-10-09 NOTE — Patient Instructions (Addendum)
Asthma  Dulera 200 mcg 2 puffs twice daily.  Continue albuterol 2 puffs once every 4 hours as needed for cough or wheeze You may use albuterol 2 puffs 5 to 15 minutes before activity to decrease cough or wheeze  Continue to rinse and brush teeth after using inhaler. Use with spacer.   Allergic rhinitis Continue allergen avoidance measures directed toward dust mite and mold as listed below Consider allergy injections Continue Xyzal 5 mg once a day as needed for runny nose or itch Continue Flonase 2 sprays in each nostril once a day as needed for stuffy nose Continue azelastine 2 sprays in each nostril up to twice a day as needed for runny nose Consider saline nasal rinses as needed for nasal symptoms. Use this before any medicated nasal sprays for best result  Allergic conjunctivitis Some over the counter eye drops include Pataday one drop in each eye once a day as needed for red, itchy eyes OR Zaditor one drop in each eye twice a day as needed for red itchy eyes. Avoid eye drops that say red eye relief as they may contain medications that dry out your eyes.   Pruritus Continue Xyzal 5 mg once a day as needed for itch Continue hydroxyzine as needed for nighttime itch  Reflux and dysphagia/EoE - continue medications as prescribed by GI - continue Dupixent weekly (prescribed by GI)  Ulcer in mouth Refer to ENT May use viscous lidocaine- swish and spit once every 6-8 hours  Call the clinic if this treatment plan is not working well for you  Follow up in the clinic in 1 month, sooner if needed.   Control of Dust Mite Allergen Dust mites play a major role in allergic asthma and rhinitis. They occur in environments with high humidity wherever human skin is found. Dust mites absorb humidity from the atmosphere (ie, they do not drink) and feed on organic matter (including shed human and animal skin). Dust mites are a microscopic type of insect that you cannot see with the naked eye. High  levels of dust mites have been detected from mattresses, pillows, carpets, upholstered furniture, bed covers, clothes, soft toys and any woven material. The principal allergen of the dust mite is found in its feces. A gram of dust may contain 1,000 mites and 250,000 fecal particles. Mite antigen is easily measured in the air during house cleaning activities. Dust mites do not bite and do not cause harm to humans, other than by triggering allergies/asthma.  Ways to decrease your exposure to dust mites in your home:  1. Encase mattresses, box springs and pillows with a mite-impermeable barrier or cover  2. Wash sheets, blankets and drapes weekly in hot water (130 F) with detergent and dry them in a dryer on the hot setting.  3. Have the room cleaned frequently with a vacuum cleaner and a damp dust-mop. For carpeting or rugs, vacuuming with a vacuum cleaner equipped with a high-efficiency particulate air (HEPA) filter. The dust mite allergic individual should not be in a room which is being cleaned and should wait 1 hour after cleaning before going into the room.  4. Do not sleep on upholstered furniture (eg, couches).  5. If possible removing carpeting, upholstered furniture and drapery from the home is ideal. Horizontal blinds should be eliminated in the rooms where the person spends the most time (bedroom, study, television room). Washable vinyl, roller-type shades are optimal.  6. Remove all non-washable stuffed toys from the bedroom. Wash stuffed toys  weekly like sheets and blankets above.  7. Reduce indoor humidity to less than 50%. Inexpensive humidity monitors can be purchased at most hardware stores. Do not use a humidifier as can make the problem worse and are not recommended.  Control of Mold Allergen Mold and fungi can grow on a variety of surfaces provided certain temperature and moisture conditions exist.  Outdoor molds grow on plants, decaying vegetation and soil.  The major outdoor  mold, Alternaria and Cladosporium, are found in very high numbers during hot and dry conditions.  Generally, a late Summer - Fall peak is seen for common outdoor fungal spores.  Rain will temporarily lower outdoor mold spore count, but counts rise rapidly when the rainy period ends.  The most important indoor molds are Aspergillus and Penicillium.  Dark, humid and poorly ventilated basements are ideal sites for mold growth.  The next most common sites of mold growth are the bathroom and the kitchen.  Outdoor [employer] Use air conditioning and keep windows closed Avoid exposure to decaying vegetation. Avoid leaf raking. Avoid grain handling. Consider wearing a face mask if working in moldy areas.  Indoor Mold Control Maintain humidity below 50%. Clean washable surfaces with 5% bleach solution. Remove sources e.g. Contaminated carpets.

## 2023-10-10 ENCOUNTER — Encounter: Payer: Self-pay | Admitting: Family Medicine

## 2023-10-10 ENCOUNTER — Telehealth: Payer: Self-pay

## 2023-10-10 DIAGNOSIS — K121 Other forms of stomatitis: Secondary | ICD-10-CM | POA: Insufficient documentation

## 2023-10-10 DIAGNOSIS — K200 Eosinophilic esophagitis: Secondary | ICD-10-CM | POA: Insufficient documentation

## 2023-10-10 NOTE — Telephone Encounter (Signed)
-----   Message from Thermon Leyland sent at 10/10/2023  7:19 AM EST ----- Can you please refer this patient to ENT asap for mouth ulcer. Thank you.

## 2023-10-15 ENCOUNTER — Telehealth: Payer: Self-pay

## 2023-10-15 NOTE — Telephone Encounter (Signed)
Ambs, Norvel Richards, FNP  [facility] Can you please call this patient and ask if the spot on the roof of her mouth is getting better or worse. Thank you  Lm for pt to call us back about this

## 2023-10-16 ENCOUNTER — Ambulatory Visit (INDEPENDENT_AMBULATORY_CARE_PROVIDER_SITE_OTHER): Payer: BC Managed Care – PPO | Admitting: Family Medicine

## 2023-10-16 ENCOUNTER — Encounter: Payer: Self-pay | Admitting: Family Medicine

## 2023-10-16 VITALS — BP 118/61 | Ht 65.0 in | Wt 194.0 lb

## 2023-10-16 DIAGNOSIS — M2142 Flat foot [pes planus] (acquired), left foot: Secondary | ICD-10-CM

## 2023-10-16 DIAGNOSIS — M2141 Flat foot [pes planus] (acquired), right foot: Secondary | ICD-10-CM | POA: Insufficient documentation

## 2023-10-16 DIAGNOSIS — M1712 Unilateral primary osteoarthritis, left knee: Secondary | ICD-10-CM | POA: Diagnosis not present

## 2023-10-16 NOTE — Progress Notes (Signed)
Carla Morales - 62 y.o. female MRN [id number]  Date of birth: 04/16/1961  PCP: Junie Spencer, FNP  Subjective:  No chief complaint on file.  Left knee OA  HPI: Past Medical, Surgical, Social, and Family History Reviewed & Updated per EMR.   Patient is a 62 y.o. female here for follow up on OA of the L knee, last seen on 09/18/2023 for steroid injection. The patient has tried intermittent use of ice, voltaren gel, and her knee sleeve which has helped some but she got only minimal relief from the injection. She has been inconsistently performing the quadriceps strengthening exercises because she lost the previously given paperwork. There has been no new injury, swelling, warmth, fever, or chills.    Past Medical History:  Diagnosis Date   Adenomatous colon polyp    Allergy    Arthritis    Asthma    Chondrocalcinosis    COVID-19    Diverticulosis    Elevated alkaline phosphatase level    Elevated antinuclear antibody (ANA) level    Eosinophilic esophagitis    Esophageal dysmotility    Esophageal stenosis    Fatty liver    GERD (gastroesophageal reflux disease)    Hyperlipidemia    Internal hemorrhoids    Migraine    Osteoarthritis    Pinched nerve in neck    Schatzki's ring    Sinus congestion    Urinary incontinence     Current Outpatient Medications on File Prior to Visit  Medication Sig Dispense Refill   albuterol (VENTOLIN HFA) 108 (90 Base) MCG/ACT inhaler USE 2 INHALATIONS EVERY 6 HOURS AS NEEDED FOR WHEEZING OR SHORTNESS OF BREATH Need office visit for further refills 54 g 0   atorvastatin (LIPITOR) 10 MG tablet Take 1 tablet (10 mg total) by mouth daily. 90 tablet 2   azelastine (ASTELIN) 0.1 % nasal spray Place 2 sprays into both nostrils 2 (two) times daily as needed for rhinitis. Use in each nostril as directed 30 mL 5   baclofen (LIORESAL) 10 MG tablet Take 1 tablet (10 mg total) by mouth 3 (three) times daily. 60 each 0   dexlansoprazole (DEXILANT) 60 MG  capsule Take by mouth.     Dupilumab (DUPIXENT) 300 MG/2ML SOPN Inject 1 pen (300 mg total) into the skin once a week. 8 mL 12   fluticasone (FLONASE) 50 MCG/ACT nasal spray Place 2 sprays into both nostrils daily. 16 g 6   hydrochlorothiazide (HYDRODIURIL) 12.5 MG tablet Take 1 tablet (12.5 mg total) by mouth daily. 90 tablet 2   HYDROcodone-acetaminophen (NORCO) 7.5-325 MG tablet Take 1 tablet by mouth every 6 (six) hours as needed for moderate pain. 15 tablet 0   hydrOXYzine (ATARAX) 25 MG tablet Take 1-2 tablets nightly as needed for itching. 60 tablet 3   Ibuprofen (ADVIL PO) Take by mouth.     ipratropium (ATROVENT) 0.03 % nasal spray 1-2 sprays up to two times daily for drainage. 30 mL 5   levocetirizine (XYZAL) 5 MG tablet Take 5 mg by mouth in the morning.     lidocaine (XYLOCAINE) 2 % solution Use as directed 15 mLs in the mouth or throat every 6 (six) hours as needed for mouth pain. 100 mL 1   magnesium gluconate (MAGONATE) 500 MG tablet Take 500 mg by mouth daily.     meloxicam (MOBIC) 15 MG tablet Take 1 tablet (15 mg total) by mouth daily. 90 tablet 0   mometasone-formoterol (DULERA) 200-5 MCG/ACT AERO Inhale 2  puffs into the lungs 2 (two) times daily. 1 each 5   nitroGLYCERIN (NITROSTAT) 0.4 MG SL tablet Place 0.4 mg under the tongue every 5 (five) minutes as needed.     Olopatadine HCl (PATADAY OP) Apply to eye.     VITAMIN E PO Take 800 Int'l Units/day by mouth daily.     No current facility-administered medications on file prior to visit.    Past Surgical History:  Procedure Laterality Date   ABDOMINAL HYSTERECTOMY     bladder dialation  1968   as a child   COLONOSCOPY     TONSILLECTOMY  1971   TOTAL VAGINAL HYSTERECTOMY  07/2010   menorrhagia, leiomyoma, prolapse   TUBAL LIGATION  1998    Allergies  Allergen Reactions   Other Swelling    Contrast dye   Contrast Media [Iodinated Contrast Media]    Dust Mite Extract    Haldol [Haloperidol Decanoate]     anxiety    Molds & Smuts    Pravastatin Other (See Comments)    myalgias   Sulfa Antibiotics Rash        Objective:  Physical Exam: VS: BP:118/61  HR: bpm  TEMP: ( )  RESP:   HT:5\' 5"  (165.1 cm)   WT:194 lb (88 kg)  BMI:32.28  Gen: NAD, speaks clearly, comfortable in exam room Respiratory: Normal respiratory effort on room air. No signs of distress Skin: No rashes, abrasions, or ecchymosis MSK:  Knee exam: Inspection: some soft tissue edema surrounding the patellar ligament bilaterally, otherwise no abnormalities ROM: full in all directions Strength: 5/5 in flex/ext Palpation w/ out warmth, no effusion present, no posterior tenderness Medial>lateral joint line tenderness to palpation  no tenderness over Gerdy's tubercle, pes bursa, or tibial tuberosity no patellar tenderness, translation NT no condyle tenderness. J sign positive with marked lateral tracking of the patella Special Tests: varus/valgus stress normal Meniscus: Thassaly negative    Assessment & Plan:   Osteoarthritis of left knee - [place] responded minimally to the steroid injection at her last visit.  - We discussed consistent quad strengthening exercises with a focus on the VMO as she has a component of patellofemoral pain as well.  - She will continue nightly ice, voltaren gel and compression sleeve use as well.  [employer] approval pending for gel injections as she did not respond to the steroid. We will follow up once improved or in 4 weeks.   Bilateral pes planus - Scaphoid pad added to insoles today. She reports some relief from the pressure on her foot. Hopefully this will provide some improvement in her sxs as she has loss of her transverse arch bilaterally - We discussed the option to have custom orthotics made if these help her.     [name] [facility] Sports Medicine Fellow  Addendum:  Patient seen and examined in the office with fellow.   History, exam, plan of care were precepted with  me.  Agree with findings as documented in fellow note above.  Darene Lamer, DO, CAQSM

## 2023-10-16 NOTE — Assessment & Plan Note (Signed)
-   Scaphoid pad added to insoles today. She reports some relief from the pressure on her foot. Hopefully this will provide some improvement in her sxs as she has loss of her transverse arch bilaterally - We discussed the option to have custom orthotics made if these help her.

## 2023-10-16 NOTE — Assessment & Plan Note (Signed)
-   Carla Morales responded minimally to the steroid injection at her last visit.  - We discussed consistent quad strengthening exercises with a focus on the VMO as she has a component of patellofemoral pain as well.  - She will continue nightly ice, voltaren gel and compression sleeve use as well.  [employer] approval pending for gel injections as she did not respond to the steroid. We will follow up once improved or in 4 weeks.

## 2023-10-17 NOTE — Progress Notes (Signed)
Contacted pt to go over insurance coverage for visco. OOP max has been met so she is covered at 100%. PA required and obtained ref# [id number] and is valid from 10/17/23-04/14/24. She would like to proceed with injections before the end of the year. She will be contacted once we receive the medicine to schedule her appts.

## 2023-10-17 NOTE — Telephone Encounter (Addendum)
Called patient - DOB/DPR doesn't a number - LMOVM to contact the office or send an update regarding provider notation below via her myChart.  I am also sending the patient a myChart message.

## 2023-10-23 ENCOUNTER — Encounter: Payer: Self-pay | Admitting: Family Medicine

## 2023-10-23 ENCOUNTER — Other Ambulatory Visit: Payer: Self-pay

## 2023-10-23 ENCOUNTER — Ambulatory Visit (INDEPENDENT_AMBULATORY_CARE_PROVIDER_SITE_OTHER): Payer: BC Managed Care – PPO | Admitting: Family Medicine

## 2023-10-23 VITALS — BP 124/84 | Ht 65.0 in | Wt 194.0 lb

## 2023-10-23 DIAGNOSIS — M1712 Unilateral primary osteoarthritis, left knee: Secondary | ICD-10-CM

## 2023-10-23 MED ORDER — HYALURONAN 30 MG/2ML IX SOSY
30.0000 mg | PREFILLED_SYRINGE | Freq: Once | INTRA_ARTICULAR | Status: AC
Start: 2023-10-23 — End: 2023-10-23
  Administered 2023-10-23: 30 mg via INTRA_ARTICULAR

## 2023-10-23 NOTE — Assessment & Plan Note (Addendum)
Knee OA with minimal response to prior CSI  PLAN: - proceed with Orthovisc #1 injection today   PROCEDURE:  Risks & benefits of Lt knee Orthovisc injection reviewed.  Consent obtained.  Time-out completed.  Patient prepped and draped in the normal fashion. Musculoskeletal ultrasound used to identify appropriate anatomy.  Patient noted to have trace effusion, no other abnormalities.  After identifying appropriate anatomy, patient positioned & area cleansed with chlorhexadine.  Ethyl chloride spray used to anesthetize the skin.  Solution of 3 mL 1% lidocaine injected into the superior-lateral aspect of the LT knee for local anesthesia under ultrasound guidance.  After ensuring adequate anesthesia 2mL Orthovisc then injected in the Lt knee under ultrasound guidance.  Needle well visualized in the knee joint.  Images saved.  Patient tolerated procedure well without any complications.  Area covered with adhesive bandage.  Post-procedure care reviewed, all questions answered.  - f/u 1 week for Orthovisc #2 injection

## 2023-10-23 NOTE — Patient Instructions (Signed)
Today you received an injection with hyaluronic acid (aka: HA injection or a gel/lubricating injection). This injection is usually done for osteoarthritis of the joint to help with temporary pain relief and temporary improvement of joint function. HA injections are not effective for everyone, and there is no scientific proof that they can restore cartilage.  We also used some "numbing medicine" (Lidocaine) with this injection to help make the procedure more comfortable for you.  The injected area may be numb and feel really good for the next couple of hours - the numbing medicine usually wears off in 2-3 hours.  After the numbing wear off you may note some pain and discomfort from the injection.      The actually benefit from the hyaluronic acid injection is usually noticed within 6-8 weeks, but may take longer.   It is not uncommon to experience discomfort and inflammation around the joint after the injection.    Things to watch out for that you should contact us or a health care provider urgently would include: 1. Unusual (as in more than 10%) increase in pain 2. New fever > 101.5 3. New swelling or redness of the injected area. 4. Streaking of red lines around the area injected.  Follow-up 1 week for Orthovisc #2 injection  Do not hesitate to call or reach out with any questions or concerns.

## 2023-10-23 NOTE — Progress Notes (Signed)
DATE OF VISIT: 10/23/2023        Afrika Chop [facility] DOB: July 23, 1961 MRN: [id number]  CC:  Orthovisc #1 left knee  History of present Illness: Carla Morales is a 62 y.o. female who presents for a follow-up visit for Orthovisc #1 left knee Last seen 10/16/23 Previous steroid injection 09/18/23 with minimal relief Has been doing HEP No prior HA injections  Medications:  Outpatient Encounter Medications as of 10/23/2023  Medication Sig   albuterol (VENTOLIN HFA) 108 (90 Base) MCG/ACT inhaler USE 2 INHALATIONS EVERY 6 HOURS AS NEEDED FOR WHEEZING OR SHORTNESS OF BREATH Need office visit for further refills   atorvastatin (LIPITOR) 10 MG tablet Take 1 tablet (10 mg total) by mouth daily.   azelastine (ASTELIN) 0.1 % nasal spray Place 2 sprays into both nostrils 2 (two) times daily as needed for rhinitis. Use in each nostril as directed   baclofen (LIORESAL) 10 MG tablet Take 1 tablet (10 mg total) by mouth 3 (three) times daily.   dexlansoprazole (DEXILANT) 60 MG capsule Take by mouth.   Dupilumab (DUPIXENT) 300 MG/2ML SOPN Inject 1 pen (300 mg total) into the skin once a week.   fluticasone (FLONASE) 50 MCG/ACT nasal spray Place 2 sprays into both nostrils daily.   hydrochlorothiazide (HYDRODIURIL) 12.5 MG tablet Take 1 tablet (12.5 mg total) by mouth daily.   HYDROcodone-acetaminophen (NORCO) 7.5-325 MG tablet Take 1 tablet by mouth every 6 (six) hours as needed for moderate pain.   hydrOXYzine (ATARAX) 25 MG tablet Take 1-2 tablets nightly as needed for itching.   Ibuprofen (ADVIL PO) Take by mouth.   ipratropium (ATROVENT) 0.03 % nasal spray 1-2 sprays up to two times daily for drainage.   levocetirizine (XYZAL) 5 MG tablet Take 5 mg by mouth in the morning.   lidocaine (XYLOCAINE) 2 % solution Use as directed 15 mLs in the mouth or throat every 6 (six) hours as needed for mouth pain.   magnesium gluconate (MAGONATE) 500 MG tablet Take 500 mg by mouth daily.   meloxicam (MOBIC) 15  MG tablet Take 1 tablet (15 mg total) by mouth daily.   mometasone-formoterol (DULERA) 200-5 MCG/ACT AERO Inhale 2 puffs into the lungs 2 (two) times daily.   nitroGLYCERIN (NITROSTAT) 0.4 MG SL tablet Place 0.4 mg under the tongue every 5 (five) minutes as needed.   Olopatadine HCl (PATADAY OP) Apply to eye.   VITAMIN E PO Take 800 Int'l Units/day by mouth daily.   [EXPIRED] Hyaluronan (ORTHOVISC) intra-articular injection 30 mg    No facility-administered encounter medications on file as of 10/23/2023.    Allergies: is allergic to other, contrast media [iodinated contrast media], dust mite extract, haldol [haloperidol decanoate], molds & smuts, pravastatin, and sulfa antibiotics.  Physical Examination: Vitals: BP 124/84   Ht 5\' 5"  (1.651 m)   Wt 194 lb (88 kg)   LMP 07/24/2010   BMI 32.28 kg/m  GENERAL:  Carla Morales is a 62 y.o. female appearing their stated age, alert and oriented x 3, in no apparent distress.  SKIN: no rashes or lesions, skin clean, dry, intact MSK: Lt knee with trace effusion, no increased redness or warmth.  Walking without a limp  Assessment & Plan Primary osteoarthritis of left knee Knee OA with minimal response to prior CSI  PLAN: - proceed with Orthovisc #1 injection today   PROCEDURE:  Risks & benefits of Lt knee Orthovisc injection reviewed.  Consent obtained.  Time-out completed.  Patient prepped and draped in  the normal fashion. Musculoskeletal ultrasound used to identify appropriate anatomy.  Patient noted to have trace effusion, no other abnormalities.  After identifying appropriate anatomy, patient positioned & area cleansed with chlorhexadine.  Ethyl chloride spray used to anesthetize the skin.  Solution of 3 mL 1% lidocaine injected into the superior-lateral aspect of the LT knee for local anesthesia under ultrasound guidance.  After ensuring adequate anesthesia 2mL Orthovisc then injected in the Lt knee under ultrasound guidance.  Needle  well visualized in the knee joint.  Images saved.  Patient tolerated procedure well without any complications.  Area covered with adhesive bandage.  Post-procedure care reviewed, all questions answered.  - f/u 1 week for Orthovisc #2 injection   Patient expressed understanding & agreement with above.  Encounter Diagnosis  Name Primary?   Primary osteoarthritis of left knee Yes    Orders Placed This Encounter  Procedures   [place] LIMITED JOINT SPACE STRUCTURES LOW LEFT

## 2023-10-30 ENCOUNTER — Ambulatory Visit (INDEPENDENT_AMBULATORY_CARE_PROVIDER_SITE_OTHER): Payer: BC Managed Care – PPO | Admitting: Family Medicine

## 2023-10-30 ENCOUNTER — Encounter: Payer: Self-pay | Admitting: Family Medicine

## 2023-10-30 ENCOUNTER — Other Ambulatory Visit: Payer: Self-pay

## 2023-10-30 VITALS — Ht 65.0 in

## 2023-10-30 DIAGNOSIS — M1712 Unilateral primary osteoarthritis, left knee: Secondary | ICD-10-CM

## 2023-10-30 DIAGNOSIS — M25572 Pain in left ankle and joints of left foot: Secondary | ICD-10-CM

## 2023-10-30 MED ORDER — HYALURONAN 30 MG/2ML IX SOSY
30.0000 mg | PREFILLED_SYRINGE | Freq: Once | INTRA_ARTICULAR | Status: AC
  Administered 2023-10-30: 30 mg via INTRA_ARTICULAR

## 2023-10-30 NOTE — Assessment & Plan Note (Addendum)
-   Injection as below.  Carla Morales tolerated this well.  We will see her next week for the follow-up injection.  PROCEDURE:  Risks & benefits of left knee Orthovisc injection reviewed.  Consent obtained.  Time-out completed.  Patient prepped and draped in the normal fashion. Musculoskeletal ultrasound used to identify appropriate anatomy.  Patient noted to have trace effusion, no other abnormalities.  After identifying appropriate anatomy, patient positioned & area cleansed with chlorhexadine.  Ethyl chloride spray used to anesthetize the skin.  Solution of 3 mL 1% lidocaine injected into the superior-lateral aspect of the left knee for local anesthesia under ultrasound guidance.  After ensuring adequate anesthesia 2mL Orthovisc then injected in the Lt knee under ultrasound guidance.  Needle well visualized in the knee joint.  Images saved.  Patient tolerated procedure well without any complications.  Area covered with adhesive bandage.  Post-procedure care reviewed, all questions answered.

## 2023-10-30 NOTE — Progress Notes (Unsigned)
Carla Morales - 62 y.o. female MRN [id number]  Date of birth: Apr 27, 1961  PCP: Junie Spencer, FNP  Subjective:  No chief complaint on file.  Osteoarthritis of the left knee  HPI: Past Medical, Surgical, Social, and Family History Reviewed & Updated per EMR.   Patient is a 62 y.o. female here for her second gel injection last seen on 10/23/2023. The patient has tried resting, and ice.    Past Medical History:  Diagnosis Date   Adenomatous colon polyp    Allergy    Arthritis    Asthma    Chondrocalcinosis    COVID-19    Diverticulosis    Elevated alkaline phosphatase level    Elevated antinuclear antibody (ANA) level    Eosinophilic esophagitis    Esophageal dysmotility    Esophageal stenosis    Fatty liver    GERD (gastroesophageal reflux disease)    Hyperlipidemia    Internal hemorrhoids    Migraine    Osteoarthritis    Pinched nerve in neck    Schatzki's ring    Sinus congestion    Urinary incontinence     Current Outpatient Medications on File Prior to Visit  Medication Sig Dispense Refill   albuterol (VENTOLIN HFA) 108 (90 Base) MCG/ACT inhaler USE 2 INHALATIONS EVERY 6 HOURS AS NEEDED FOR WHEEZING OR SHORTNESS OF BREATH Need office visit for further refills 54 g 0   atorvastatin (LIPITOR) 10 MG tablet Take 1 tablet (10 mg total) by mouth daily. 90 tablet 2   azelastine (ASTELIN) 0.1 % nasal spray Place 2 sprays into both nostrils 2 (two) times daily as needed for rhinitis. Use in each nostril as directed 30 mL 5   baclofen (LIORESAL) 10 MG tablet Take 1 tablet (10 mg total) by mouth 3 (three) times daily. 60 each 0   dexlansoprazole (DEXILANT) 60 MG capsule Take by mouth.     Dupilumab (DUPIXENT) 300 MG/2ML SOPN Inject 1 pen (300 mg total) into the skin once a week. 8 mL 12   fluticasone (FLONASE) 50 MCG/ACT nasal spray Place 2 sprays into both nostrils daily. 16 g 6   hydrochlorothiazide (HYDRODIURIL) 12.5 MG tablet Take 1 tablet (12.5 mg total) by mouth  daily. 90 tablet 2   HYDROcodone-acetaminophen (NORCO) 7.5-325 MG tablet Take 1 tablet by mouth every 6 (six) hours as needed for moderate pain. 15 tablet 0   hydrOXYzine (ATARAX) 25 MG tablet Take 1-2 tablets nightly as needed for itching. 60 tablet 3   Ibuprofen (ADVIL PO) Take by mouth.     ipratropium (ATROVENT) 0.03 % nasal spray 1-2 sprays up to two times daily for drainage. 30 mL 5   levocetirizine (XYZAL) 5 MG tablet Take 5 mg by mouth in the morning.     lidocaine (XYLOCAINE) 2 % solution Use as directed 15 mLs in the mouth or throat every 6 (six) hours as needed for mouth pain. 100 mL 1   magnesium gluconate (MAGONATE) 500 MG tablet Take 500 mg by mouth daily.     meloxicam (MOBIC) 15 MG tablet Take 1 tablet (15 mg total) by mouth daily. 90 tablet 0   mometasone-formoterol (DULERA) 200-5 MCG/ACT AERO Inhale 2 puffs into the lungs 2 (two) times daily. 1 each 5   nitroGLYCERIN (NITROSTAT) 0.4 MG SL tablet Place 0.4 mg under the tongue every 5 (five) minutes as needed.     Olopatadine HCl (PATADAY OP) Apply to eye.     VITAMIN E PO Take 800 Int'l  Units/day by mouth daily.     No current facility-administered medications on file prior to visit.    Past Surgical History:  Procedure Laterality Date   ABDOMINAL HYSTERECTOMY     bladder dialation  1968   as a child   COLONOSCOPY     TONSILLECTOMY  1971   TOTAL VAGINAL HYSTERECTOMY  07/2010   menorrhagia, leiomyoma, prolapse   TUBAL LIGATION  1998    Allergies  Allergen Reactions   Other Swelling    Contrast dye   Contrast Media [Iodinated Contrast Media]    Dust Mite Extract    Haldol [Haloperidol Decanoate]     anxiety   Molds & Smuts    Pravastatin Other (See Comments)    myalgias   Sulfa Antibiotics Rash        Objective:  Physical Exam: VS: BP:   HR: bpm  TEMP: ( )  RESP:   HT:5\' 5"  (165.1 cm)   WT:   BMI:   Gen: NAD, speaks clearly, comfortable in exam room Respiratory: Normal respiratory effort on room  air. No signs of distress Skin: No rashes, abrasions, or ecchymosis MSK: Inspection of the left knee shows a well-healing needle entry site with trace ecchymosis surrounding on the lateral aspect of the suprapatellar pouch No overlying erythema, warmth, or rashes No gross deformities of the knee    Assessment & Plan:   Osteoarthritis of left knee - Injection as below.  Emora tolerated this well.  We will see her next week for the follow-up injection.  PROCEDURE:  Risks & benefits of left knee Orthovisc injection reviewed.  Consent obtained.  Time-out completed.  Patient prepped and draped in the normal fashion. Musculoskeletal ultrasound used to identify appropriate anatomy.  Patient noted to have trace effusion, no other abnormalities.  After identifying appropriate anatomy, patient positioned & area cleansed with chlorhexadine.  Ethyl chloride spray used to anesthetize the skin.  Solution of 3 mL 1% lidocaine injected into the superior-lateral aspect of the left knee for local anesthesia under ultrasound guidance.  After ensuring adequate anesthesia 2mL Orthovisc then injected in the Lt knee under ultrasound guidance.  Needle well visualized in the knee joint.  Images saved.  Patient tolerated procedure well without any complications.  Area covered with adhesive bandage.  Post-procedure care reviewed, all questions answered.    Pain of joint of left ankle and foot Previously given scaphoid pads on shoe insoles in the past 10/16/23.  Still some forefoot pain in the left foot with mild transverse arch collpase  PLAN: - Fitted with small MT on the left shoe insert - was comfortable in the office today - reassess comfort at next visit.  Could consider custom orthotics in the future  [name] [facility] Sports Medicine Fellow  Addendum:  Patient seen and examined in the office with fellow.   History, exam, plan of care were precepted with me.  I was present and assisted with procedure  today.  Agree with findings as documented in fellow note with additions as noted above.  Darene Lamer, DO, CAQSM

## 2023-10-30 NOTE — Patient Instructions (Signed)
 Today you received an injection with hyaluronic acid (aka: HA injection or a gel/lubricating injection). This injection is usually done for osteoarthritis of the joint to help with temporary pain relief and temporary improvement of joint function. HA injections are not effective for everyone, and there is no scientific proof that they can restore cartilage.  We also used some "numbing medicine" (Lidocaine) with this injection to help make the procedure more comfortable for you.  The injected area may be numb and feel really good for the next couple of hours - the numbing medicine usually wears off in 2-3 hours.  After the numbing wear off you may note some pain and discomfort from the injection.      The actually benefit from the hyaluronic acid injection is usually noticed within 6-8 weeks, but may take longer.   It is not uncommon to experience discomfort and inflammation around the joint after the injection.    Things to watch out for that you should contact us or a health care provider urgently would include: 1. Unusual (as in more than 10%) increase in pain 2. New fever > 101.5 3. New swelling or redness of the injected area. 4. Streaking of red lines around the area injected.  Do not hesitate to call or reach out with any questions or concerns.

## 2023-11-05 ENCOUNTER — Encounter: Payer: Self-pay | Admitting: Family Medicine

## 2023-11-05 ENCOUNTER — Other Ambulatory Visit: Payer: Self-pay

## 2023-11-05 ENCOUNTER — Ambulatory Visit: Payer: BC Managed Care – PPO | Admitting: Family Medicine

## 2023-11-05 VITALS — BP 118/64 | Ht 65.0 in | Wt 189.0 lb

## 2023-11-05 DIAGNOSIS — M9901 Segmental and somatic dysfunction of cervical region: Secondary | ICD-10-CM | POA: Diagnosis not present

## 2023-11-05 DIAGNOSIS — M62830 Muscle spasm of back: Secondary | ICD-10-CM | POA: Diagnosis not present

## 2023-11-05 DIAGNOSIS — M9902 Segmental and somatic dysfunction of thoracic region: Secondary | ICD-10-CM | POA: Diagnosis not present

## 2023-11-05 DIAGNOSIS — M1712 Unilateral primary osteoarthritis, left knee: Secondary | ICD-10-CM

## 2023-11-05 DIAGNOSIS — M9903 Segmental and somatic dysfunction of lumbar region: Secondary | ICD-10-CM | POA: Diagnosis not present

## 2023-11-05 MED ORDER — HYALURONAN 30 MG/2ML IX SOSY
30.0000 mg | PREFILLED_SYRINGE | Freq: Once | INTRA_ARTICULAR | Status: AC
  Administered 2023-11-05: 30 mg via INTRA_ARTICULAR

## 2023-11-05 NOTE — Patient Instructions (Signed)
 Today you received an injection with hyaluronic acid (aka: HA injection or a gel/lubricating injection). This injection is usually done for osteoarthritis of the joint to help with temporary pain relief and temporary improvement of joint function. HA injections are not effective for everyone, and there is no scientific proof that they can restore cartilage.  We also used some "numbing medicine" (Lidocaine) with this injection to help make the procedure more comfortable for you.  The injected area may be numb and feel really good for the next couple of hours - the numbing medicine usually wears off in 2-3 hours.  After the numbing wear off you may note some pain and discomfort from the injection.      The actually benefit from the hyaluronic acid injection is usually noticed within 6-8 weeks, but may take longer.   It is not uncommon to experience discomfort and inflammation around the joint after the injection.    Things to watch out for that you should contact us or a health care provider urgently would include: 1. Unusual (as in more than 10%) increase in pain 2. New fever > 101.5 3. New swelling or redness of the injected area. 4. Streaking of red lines around the area injected.  Do not hesitate to call or reach out with any questions or concerns.

## 2023-11-05 NOTE — Assessment & Plan Note (Addendum)
Knee OA with minimal response to prior CSI   PLAN: - proceed with Orthovisc #3 injection today    PROCEDURE:  Risks & benefits of Lt knee Orthovisc injection reviewed.  Consent obtained.  Time-out completed.  Patient prepped and draped in the normal fashion. Musculoskeletal ultrasound used to identify appropriate anatomy.  Patient noted to have no effusion, no other abnormalities.  After identifying appropriate anatomy, patient positioned & area cleansed with chlorhexadine.  Ethyl chloride spray used to anesthetize the skin.  Solution of 3 mL 1% lidocaine injected into the superior-lateral aspect of the LT knee for local anesthesia under ultrasound guidance.  After ensuring adequate anesthesia 2mL Orthovisc then injected in the Lt knee under ultrasound guidance.  Needle well visualized in the knee joint.  Images saved.  Patient tolerated procedure well without any complications.  Area covered with adhesive bandage.  Post-procedure care reviewed, all questions answered.  Follow-up 4-6 weeks to reassess, sooner prn

## 2023-11-05 NOTE — Progress Notes (Signed)
DATE OF VISIT: 11/05/2023        Carla Morales DOB: August 09, 1961 MRN: [id number]  CC:  Lt knee Orthovisc #3  History of present Illness: Carla Morales is a 62 y.o. female who presents for a follow-up visit for Orthovisc #3 of left knee. Feeling improvement from the first two injections  Medications:  Outpatient Encounter Medications as of 11/05/2023  Medication Sig   albuterol (VENTOLIN HFA) 108 (90 Base) MCG/ACT inhaler USE 2 INHALATIONS EVERY 6 HOURS AS NEEDED FOR WHEEZING OR SHORTNESS OF BREATH Need office visit for further refills   atorvastatin (LIPITOR) 10 MG tablet Take 1 tablet (10 mg total) by mouth daily.   azelastine (ASTELIN) 0.1 % nasal spray Place 2 sprays into both nostrils 2 (two) times daily as needed for rhinitis. Use in each nostril as directed   baclofen (LIORESAL) 10 MG tablet Take 1 tablet (10 mg total) by mouth 3 (three) times daily.   dexlansoprazole (DEXILANT) 60 MG capsule Take by mouth.   Dupilumab (DUPIXENT) 300 MG/2ML SOPN Inject 1 pen (300 mg total) into the skin once a week.   fluticasone (FLONASE) 50 MCG/ACT nasal spray Place 2 sprays into both nostrils daily.   hydrochlorothiazide (HYDRODIURIL) 12.5 MG tablet Take 1 tablet (12.5 mg total) by mouth daily.   HYDROcodone-acetaminophen (NORCO) 7.5-325 MG tablet Take 1 tablet by mouth every 6 (six) hours as needed for moderate pain.   hydrOXYzine (ATARAX) 25 MG tablet Take 1-2 tablets nightly as needed for itching.   Ibuprofen (ADVIL PO) Take by mouth.   ipratropium (ATROVENT) 0.03 % nasal spray 1-2 sprays up to two times daily for drainage.   levocetirizine (XYZAL) 5 MG tablet Take 5 mg by mouth in the morning.   lidocaine (XYLOCAINE) 2 % solution Use as directed 15 mLs in the mouth or throat every 6 (six) hours as needed for mouth pain.   magnesium gluconate (MAGONATE) 500 MG tablet Take 500 mg by mouth daily.   meloxicam (MOBIC) 15 MG tablet Take 1 tablet (15 mg total) by mouth daily.    mometasone-formoterol (DULERA) 200-5 MCG/ACT AERO Inhale 2 puffs into the lungs 2 (two) times daily.   nitroGLYCERIN (NITROSTAT) 0.4 MG SL tablet Place 0.4 mg under the tongue every 5 (five) minutes as needed.   Olopatadine HCl (PATADAY OP) Apply to eye.   VITAMIN E PO Take 800 Int'l Units/day by mouth daily.   [EXPIRED] Hyaluronan (ORTHOVISC) intra-articular injection 30 mg    No facility-administered encounter medications on file as of 11/05/2023.    Allergies: is allergic to other, contrast media [iodinated contrast media], dust mite extract, haldol [haloperidol decanoate], molds & smuts, pravastatin, and sulfa antibiotics.  Physical Examination: Vitals: BP 118/64   Ht 5\' 5"  (1.651 m)   Wt 189 lb (85.7 kg)   LMP 07/24/2010   BMI 31.45 kg/m  GENERAL:  Carla Morales is a 62 y.o. female appearing their stated age, alert and oriented x 3, in no apparent distress.  SKIN: no rashes or lesions, skin clean, dry, intact MSK: Lt knee without and swelling or redness N/V/I distally  Assessment & Plan Primary osteoarthritis of left knee Knee OA with minimal response to prior CSI   PLAN: - proceed with Orthovisc #3 injection today    PROCEDURE:  Risks & benefits of Lt knee Orthovisc injection reviewed.  Consent obtained.  Time-out completed.  Patient prepped and draped in the normal fashion. Musculoskeletal ultrasound used to identify appropriate anatomy.  Patient noted to  have no effusion, no other abnormalities.  After identifying appropriate anatomy, patient positioned & area cleansed with chlorhexadine.  Ethyl chloride spray used to anesthetize the skin.  Solution of 3 mL 1% lidocaine injected into the superior-lateral aspect of the LT knee for local anesthesia under ultrasound guidance.  After ensuring adequate anesthesia 2mL Orthovisc then injected in the Lt knee under ultrasound guidance.  Needle well visualized in the knee joint.  Images saved.  Patient tolerated procedure well  without any complications.  Area covered with adhesive bandage.  Post-procedure care reviewed, all questions answered.  Follow-up 4-6 weeks to reassess, sooner prn   Patient expressed understanding & agreement with above.  Encounter Diagnosis  Name Primary?   Primary osteoarthritis of left knee Yes    Orders Placed This Encounter  Procedures   [place] LIMITED JOINT SPACE STRUCTURES LOW LEFT

## 2023-11-06 ENCOUNTER — Ambulatory Visit: Payer: BC Managed Care – PPO | Admitting: Family Medicine

## 2023-11-12 ENCOUNTER — Ambulatory Visit (INDEPENDENT_AMBULATORY_CARE_PROVIDER_SITE_OTHER): Payer: BC Managed Care – PPO | Admitting: Otolaryngology

## 2023-11-12 ENCOUNTER — Encounter (INDEPENDENT_AMBULATORY_CARE_PROVIDER_SITE_OTHER): Payer: Self-pay | Admitting: Otolaryngology

## 2023-11-12 VITALS — BP 118/71 | HR 87 | Ht 65.0 in | Wt 192.0 lb

## 2023-11-12 DIAGNOSIS — R0982 Postnasal drip: Secondary | ICD-10-CM

## 2023-11-12 DIAGNOSIS — J383 Other diseases of vocal cords: Secondary | ICD-10-CM

## 2023-11-12 DIAGNOSIS — R490 Dysphonia: Secondary | ICD-10-CM | POA: Diagnosis not present

## 2023-11-12 DIAGNOSIS — K219 Gastro-esophageal reflux disease without esophagitis: Secondary | ICD-10-CM

## 2023-11-12 DIAGNOSIS — J329 Chronic sinusitis, unspecified: Secondary | ICD-10-CM

## 2023-11-12 DIAGNOSIS — K121 Other forms of stomatitis: Secondary | ICD-10-CM | POA: Diagnosis not present

## 2023-11-12 DIAGNOSIS — K200 Eosinophilic esophagitis: Secondary | ICD-10-CM

## 2023-11-12 DIAGNOSIS — J3089 Other allergic rhinitis: Secondary | ICD-10-CM

## 2023-11-12 NOTE — Patient Instructions (Signed)

## 2023-11-19 ENCOUNTER — Encounter: Payer: Self-pay | Admitting: Family

## 2023-11-19 ENCOUNTER — Telehealth (INDEPENDENT_AMBULATORY_CARE_PROVIDER_SITE_OTHER): Payer: BC Managed Care – PPO | Admitting: Family

## 2023-11-19 DIAGNOSIS — U071 COVID-19: Secondary | ICD-10-CM | POA: Diagnosis not present

## 2023-11-19 MED ORDER — NIRMATRELVIR/RITONAVIR (PAXLOVID)TABLET: 3.0000 | ORAL_TABLET | Freq: Two times a day (BID) | ORAL | 0 refills | Status: AC

## 2023-11-19 NOTE — Progress Notes (Signed)
 Virtual Visit Consent   [facility], you are scheduled for a virtual visit with a [facility] provider today. Just as with appointments in the office, your consent must be obtained to participate. Your consent will be active for this visit and any virtual visit you may have with one of our providers in the next 365 days. If you have a MyChart account, a copy of this consent can be sent to you electronically.  As this is a virtual visit, video technology does not allow for your provider to perform a traditional examination. This may limit your provider's ability to fully assess your condition. If your provider identifies any concerns that need to be evaluated in person or the need to arrange testing (such as labs, EKG, etc.), we will make arrangements to do so. Although advances in technology are sophisticated, we cannot ensure that it will always work on either your end or our end. If the connection with a video visit is poor, the visit may have to be switched to a telephone visit. With either a video or telephone visit, we are not always able to ensure that we have a secure connection.  By engaging in this virtual visit, you consent to the provision of healthcare and authorize for your insurance to be billed (if applicable) for the services provided during this visit. Depending on your insurance coverage, you may receive a charge related to this service.  I need to obtain your verbal consent now. Are you willing to proceed with your visit today? Carla Morales Pitkin has provided verbal consent on 11/19/2023 for a virtual visit (video or telephone). Bari Learn, FNP  Date: 11/19/2023 9:55 AM  Virtual Visit via Video Note   I, Bari Learn, connected with  Carla Morales  ([id number], 02-14-1961) on 11/19/23 at 10:10 AM EST by a video-enabled telemedicine application and verified that I am speaking with the correct person using two identifiers.  Location: Patient: Virtual Visit Location  Patient: Home Provider: Virtual Visit Location Provider: Home Office   I discussed the limitations of evaluation and management by telemedicine and the availability of in person appointments. The patient expressed understanding and agreed to proceed.    History of Present Illness: Carla Morales is a 63 y.o. who identifies as a female who was assigned female at birth, and is being seen today for COVID. She reports her symptoms started yesterday. She has been at [employer].   HPI: URI  This is a new problem. The current episode started yesterday. The problem has been unchanged. There has been no fever. Associated symptoms include congestion, coughing, headaches, rhinorrhea, sinus pain, sneezing and a sore throat. Pertinent negatives include no ear pain, joint pain or wheezing. She has tried decongestant and inhaler use for the symptoms. The treatment provided mild relief.    Problems:  Patient Active Problem List   Diagnosis Date Noted   Bilateral pes planus 10/16/2023   Mouth ulcer 10/10/2023   Eosinophilic esophagitis 10/10/2023   Osteoarthritis of left knee 09/18/2023   Pruritus 03/28/2023   Allergic conjunctivitis of both eyes 03/28/2023   Peripheral edema 07/11/2021   Asthma 01/07/2021   GERD (gastroesophageal reflux disease) 10/29/2019   Vitamin D  deficiency 10/29/2019   Family history of breast cancer    Family history of uterine cancer    Family history of lung cancer    Family history of melanoma    Obesity (BMI 30-39.9) 03/29/2018   Perennial allergic rhinitis    Hyperlipidemia  Allergies:  Allergies  Allergen Reactions   Other Swelling    Contrast dye   Contrast Media [Iodinated Contrast Media]    Dust Mite Extract    Haldol [Haloperidol Decanoate]     anxiety   Molds & Smuts    Pravastatin  Other (See Comments)    myalgias   Sulfa Antibiotics Rash   Medications:  Current Outpatient Medications:    nirmatrelvir /ritonavir  (PAXLOVID ) 20 x 150 MG & 10 x  100MG  TABS, Take 3 tablets by mouth 2 (two) times daily for 5 days. (Take nirmatrelvir  150 mg two tablets twice daily for 5 days and ritonavir  100 mg one tablet twice daily for 5 days) Patient GFR is 60, Disp: 30 tablet, Rfl: 0   albuterol  (VENTOLIN  HFA) 108 (90 Base) MCG/ACT inhaler, USE 2 INHALATIONS EVERY 6 HOURS AS NEEDED FOR WHEEZING OR SHORTNESS OF BREATH Need office visit for further refills, Disp: 54 g, Rfl: 0   atorvastatin  (LIPITOR) 10 MG tablet, Take 1 tablet (10 mg total) by mouth daily., Disp: 90 tablet, Rfl: 2   azelastine  (ASTELIN ) 0.1 % nasal spray, Place 2 sprays into both nostrils 2 (two) times daily as needed for rhinitis. Use in each nostril as directed, Disp: 30 mL, Rfl: 5   baclofen  (LIORESAL ) 10 MG tablet, Take 1 tablet (10 mg total) by mouth 3 (three) times daily., Disp: 60 each, Rfl: 0   dexlansoprazole  (DEXILANT ) 60 MG capsule, Take by mouth., Disp: , Rfl:    Dupilumab  (DUPIXENT ) 300 MG/2ML SOPN, Inject 1 pen (300 mg total) into the skin once a week., Disp: 8 mL, Rfl: 12   fluticasone  (FLONASE ) 50 MCG/ACT nasal spray, Place 2 sprays into both nostrils daily., Disp: 16 g, Rfl: 6   hydrochlorothiazide  (HYDRODIURIL ) 12.5 MG tablet, Take 1 tablet (12.5 mg total) by mouth daily., Disp: 90 tablet, Rfl: 2   HYDROcodone -acetaminophen  (NORCO) 7.5-325 MG tablet, Take 1 tablet by mouth every 6 (six) hours as needed for moderate pain., Disp: 15 tablet, Rfl: 0   hydrOXYzine  (ATARAX ) 25 MG tablet, Take 1-2 tablets nightly as needed for itching., Disp: 60 tablet, Rfl: 3   Ibuprofen (ADVIL PO), Take by mouth., Disp: , Rfl:    ipratropium (ATROVENT ) 0.03 % nasal spray, 1-2 sprays up to two times daily for drainage., Disp: 30 mL, Rfl: 5   levocetirizine (XYZAL) 5 MG tablet, Take 5 mg by mouth in the morning., Disp: , Rfl:    lidocaine  (XYLOCAINE ) 2 % solution, Use as directed 15 mLs in the mouth or throat every 6 (six) hours as needed for mouth pain., Disp: 100 mL, Rfl: 1   magnesium gluconate  (MAGONATE) 500 MG tablet, Take 500 mg by mouth daily., Disp: , Rfl:    meloxicam  (MOBIC ) 15 MG tablet, Take 1 tablet (15 mg total) by mouth daily., Disp: 90 tablet, Rfl: 0   mometasone -formoterol  (DULERA ) 200-5 MCG/ACT AERO, Inhale 2 puffs into the lungs 2 (two) times daily., Disp: 1 each, Rfl: 5   nitroGLYCERIN  (NITROSTAT ) 0.4 MG SL tablet, Place 0.4 mg under the tongue every 5 (five) minutes as needed., Disp: , Rfl:    Olopatadine HCl (PATADAY OP), Apply to eye., Disp: , Rfl:    VITAMIN E PO, Take 800 Int'l Units/day by mouth daily., Disp: , Rfl:   Observations/Objective: Patient is well-developed, well-nourished in no acute distress.  Resting comfortably  at home.  Head is normocephalic, atraumatic.  No labored breathing.  Speech is clear and coherent with logical content.  Patient is alert  and oriented at baseline.    Assessment and Plan: 1. COVID-19 (Primary) - nirmatrelvir /ritonavir  (PAXLOVID ) 20 x 150 MG & 10 x 100MG  TABS; Take 3 tablets by mouth 2 (two) times daily for 5 days. (Take nirmatrelvir  150 mg two tablets twice daily for 5 days and ritonavir  100 mg one tablet twice daily for 5 days) Patient GFR is 60  Dispense: 30 tablet; Refill: 0  COVID positive, rest, force fluids, tylenol  as needed, report any worsening symptoms such as increased shortness of breath, swelling, or continued high fevers. Possible adverse effects discussed with antivirals.    Follow Up Instructions: I discussed the assessment and treatment plan with the patient. The patient was provided an opportunity to ask questions and all were answered. The patient agreed with the plan and demonstrated an understanding of the instructions.  A copy of instructions were sent to the patient via MyChart unless otherwise noted below.     The patient was advised to call back or seek an in-person evaluation if the symptoms worsen or if the condition fails to improve as anticipated.    Bari Learn, FNP

## 2023-12-03 ENCOUNTER — Ambulatory Visit: Payer: BC Managed Care – PPO | Attending: Family

## 2023-12-03 ENCOUNTER — Other Ambulatory Visit: Payer: Self-pay

## 2023-12-03 DIAGNOSIS — R490 Dysphonia: Secondary | ICD-10-CM | POA: Insufficient documentation

## 2023-12-03 DIAGNOSIS — J383 Other diseases of vocal cords: Secondary | ICD-10-CM | POA: Diagnosis not present

## 2023-12-03 NOTE — Therapy (Signed)
OUTPATIENT SPEECH LANGUAGE PATHOLOGY VOICE EVALUATION   Patient Name: Carla Morales MRN: [id number] DOB:May 25, 1961, 63 y.o., female Today's Date: 12/03/2023  PCP: Jannifer Rodney, FNP REFERRING PROVIDER: Ashok Croon, MD  END OF SESSION:  End of Session - 12/03/23 2043     Visit Number 1    Number of Visits 17    Date for SLP Re-Evaluation 02/22/24    SLP Start Time 1234    SLP Stop Time  1315    SLP Time Calculation (min) 41 min    Activity Tolerance Patient tolerated treatment well             Past Medical History:  Diagnosis Date   Adenomatous colon polyp    Allergy    Arthritis    Asthma    Chondrocalcinosis    COVID-19    Diverticulosis    Elevated alkaline phosphatase level    Elevated antinuclear antibody (ANA) level    Eosinophilic esophagitis    Esophageal dysmotility    Esophageal stenosis    Fatty liver    GERD (gastroesophageal reflux disease)    Hyperlipidemia    Internal hemorrhoids    Migraine    Osteoarthritis    Pinched nerve in neck    Schatzki's ring    Sinus congestion    Urinary incontinence    Past Surgical History:  Procedure Laterality Date   ABDOMINAL HYSTERECTOMY     bladder dialation  1968   as a child   COLONOSCOPY     TONSILLECTOMY  1971   TOTAL VAGINAL HYSTERECTOMY  07/2010   menorrhagia, leiomyoma, prolapse   TUBAL LIGATION  1998   Patient Active Problem List   Diagnosis Date Noted   Bilateral pes planus 10/16/2023   Mouth ulcer 10/10/2023   Eosinophilic esophagitis 10/10/2023   Osteoarthritis of left knee 09/18/2023   Pruritus 03/28/2023   Allergic conjunctivitis of both eyes 03/28/2023   Peripheral edema 07/11/2021   Asthma 01/07/2021   GERD (gastroesophageal reflux disease) 10/29/2019   Vitamin D deficiency 10/29/2019   Family history of breast cancer    Family history of uterine cancer    Family history of lung cancer    Family history of melanoma    Obesity (BMI 30-39.9) 03/29/2018    Perennial allergic rhinitis    Hyperlipidemia     Onset date: 2020, worse 18 months ago  REFERRING DIAG:  R49.0 (ICD-10-CM) - Dysphonia  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 No  FALLS: Has patient fallen in last 6 months? Yes, Number of falls: 1  LIVING ENVIRONMENT: Lives with: lives with their family Lives in: House/apartment  PLOF:Level of assistance: Independent with ADLs, Independent with IADLs Employment: Full-time employment  PATIENT GOALS: Hoarseness going away  OBJECTIVE:  Note: Objective measures were completed at Evaluation unless otherwise noted.  DIAGNOSTIC FINDINGS:  ENT - [place] 11/12/23 Procedure: The patient was seated upright in the exam chair.   Topical lidocaine and Afrin were applied to the nasal cavity. After adequate anesthesia had occurred, the flexible telescope was passed into the nasal cavity. The nasopharynx was patent without mass or lesion. The scope was passed behind the soft palate  and directed toward the base of tongue. The base of tongue was visualized and was symmetric with no apparent masses or abnormal appearing tissue. There were no signs of a mass or pooling of secretions in the piriform sinuses. The supraglottic structures were normal.   The true vocal cords are mobile. The medial edges were bowed. Closure was incomplete with spindle-shaped glottic gap and supraglottic compression. Periodicity present. The mucosal wave and amplitude were symmetric and present. There is moderate interarytenoid pachydermia and post cricoid edema. The mucosa appears without  lesions.   The laryngoscope was then slowly withdrawn and the patient tolerated the procedure well. There were no complications or blood loss.Assessment and Plan    Chronic dysphonia and Voice Fatigue Recurrent hoarseness and throat pain, exacerbated by talking and singing. Vocal folds appear thin with incomplete closure and mild edema on videostrobe today. No evidence of tumor or mass/no lesions mucosal wave intact. She had significant supraglottic compression and I believe there is an element of muscle tension which might cause throat discomfort/voice fatigue over time.  Discussed voice therapy as a first-line treatment to optimize breath support and reduce symptoms. Potential future interventions include vocal fold filler injection augmentation vs trial of Botox if voice therapy is ineffective and repeat exam will be c/w laryngeal spasm.  - Refer to voice therapy - Optimize reflux control - Continue current management of postnasal drainage - Consider elimination diet for EoE  - trial of Reflux Gourmet   Gastroesophageal Reflux Disease (GERD) GERD managed with Dexilant. Symptoms include throat pain and possible contribution to voice changes. Discussed optimizing reflux control through dietary adjustments and natural supplements. - Continue Dexilant at current dose - Optimize reflux control through dietary adjustments and trial of reflux gourmet   Eosinophilic Esophagitis (EOE) EOE managed with Dupixent and Dexilant. No significant improvement noted on recent endoscopy per report. No formal elimination diet in the past. Discussed the six-food elimination diet to identify specific triggers and improve symptoms. - Provide information on six-food elimination diet - Encourage dietary adjustments to identify specific triggers   Asthma and Environmental Allergies Post-nasal drainage Nasal endoscopy today with S-shaped septum and septal deviation but no evidence of polyps or no pus to suggest  sinusitis  Asthma with episodes of uncontrolled symptoms requiring increased medication. Currently on Dupixent, Dulera, Flonase/Asteline/Ipratropium Bromide. Discussed the importance of rinsing mouth after using inhalers to prevent thrush. - Continue current asthma medications (Dupixent, Dulera) - Monitor for symptom control and adjust medications as needed - continue Flonase/Asteline/Ipratropium Bromide daily 2 puffs b/l nares  - continue Xyzal 5 mg daily    Recurrent Thrush - no evidence of thrush on exam today Recurrent thrush associated with steroid use for asthma. No current evidence of thrush on examination. Discussed the importance of rinsing mouth after using inhalers to prevent recurrence. 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 No current ulceration or significant symptoms noted. Discussed monitoring for any changes or recurrence of symptoms and potential biopsy if ulceration recurs. - Monitor for any changes or recurrence of symptoms - Consider biopsy if ulceration recurs  COGNITION: Overall cognitive status: Within functional limits for tasks assessed  SOCIAL HISTORY: Occupation: Catering manager intake: suboptimal Caffeine/alcohol intake: moderate Daily voice use: moderate  PERCEPTUAL VOICE ASSESSMENT: Voice quality: hoarse and  rough Vocal abuse:  none noted; throat cleared once Resonance: normal Respiratory function: thoracic breathing  OBJECTIVE VOICE ASSESSMENT: Maximum phonation time for sustained "ah": 7.02 Conversational pitch average: 168 Hz Conversational pitch range: 80-289 Hz Conversational loudness average: 68 dB Conversational loudness range: 50-78 dB S/z ratio: 2.18 (Suggestive of dysfunction >1.0)  PATIENT REPORTED OUTCOME MEASURES  (PROM): V-RQOL: provided during first therapy session                                                                                                                            TREATMENT DATE:   1/20 (eval): N/A  PATIENT EDUCATION: Education details: See "clinical impressions" below Person educated: Patient Education method: Explanation Education comprehension: verbalized understanding  HOME EXERCISE PROGRAM: Eventually pt will perform SOVTE, and other voice tasks at home. PhoRTE will also be completed at home after excess tension in pt's voice is treated.   GOALS: Goals reviewed with patient? Yes  SHORT TERM GOALS: Target date: 01/03/24  Pt will complete exercises to reduce vocal tension (SOVTE and others) as directed at least days/week for two weeks via tracking sheet  Baseline: Goal status: INITIAL  2.  Pt will demo improved voice in sentence responses 70% in 3 sessions Baseline:  Goal status: INITIAL  3.  Pt will achieve AB in sentence responses 70% in 3 sessions Baseline:  Goal status: INITIAL  4.  Pt will complete PhoRTE exercises with occasional min mod A Baseline:  Goal status: INITIAL  5.  Pt will incr water intake to at least 48 oz/day at least 5 days in a 7 day period, as monitored by tracking sheet Baseline:  Goal status: INITIAL   LONG TERM GOALS: Target date: 02/22/24  Pt will improve PROM Baseline:  Goal status: INITIAL  2.  Pt will decr s/z ratio to <1.0 Baseline:  Goal status: INITIAL  3.  Pt will complete exercises to improve voice/decr glottal gap as directed at least 6 days/week for two weeks via tracking sheet  Baseline:  Goal status: INITIAL  4.  Pt will incr water intake to at least 60 oz/day at least 5 days in a 7 day period, as monitored by tracking sheet Baseline:  Goal status: INITIAL  5  Pt will demo improved voice in 10 minutes conversation in 3 sessions Baseline:  Goal status: INITIAL  6.  Pt will achieve AB to at least 70%  in 10 minutes conversation in 3 sessions Baseline:  Goal status: INITIAL  ASSESSMENT:  CLINICAL IMPRESSION: Patient is a 63 y.o. F who was seen today for assessment of voice disorder c/b incomplete glottic closure (spindle-shaped gap), voice fatigue, and excess muscle tension (supraglottic compression) when voicing. Additionally pt is breathing with her thorax and not her abdomen, and drinks suboptimal H2O and mod excessive caffeine.  Pt is highly motivated and based upon her willingness to participate in ST, is a good candidate for skilled ST targeting improved vocal quality. Discussed results of evaluation  and how they coincide with pt's videostroboscopy exam, need for ST for treating extra tension in larynx and the vocal fold changes brought on by aging. Educated why decr'ing caffeine and increasing water consumption is vocally hygenic.  OBJECTIVE IMPAIRMENTS: include voice disorder. These impairments are limiting patient from return to work, household responsibilities, ADLs/IADLs, and effectively communicating at home and in community. Factors affecting potential to achieve goals and functional outcome are severity of impairments.. Patient will benefit from skilled SLP services to address above impairments and improve overall function.  REHAB POTENTIAL: Good  PLAN:  SLP FREQUENCY: 1-2x/week  SLP DURATION: 10 weeks  PLANNED INTERVENTIONS: Environmental controls, Internal/external aids, Functional tasks, SLP instruction and feedback, Compensatory strategies, Patient/family education, [phone] Treatment of speech (30 or 45 min) , and voice exercises    Armida Vickroy, CCC-SLP 12/03/2023, 8:45 PM

## 2023-12-04 DIAGNOSIS — M9901 Segmental and somatic dysfunction of cervical region: Secondary | ICD-10-CM | POA: Diagnosis not present

## 2023-12-04 DIAGNOSIS — M62830 Muscle spasm of back: Secondary | ICD-10-CM | POA: Diagnosis not present

## 2023-12-04 DIAGNOSIS — M9902 Segmental and somatic dysfunction of thoracic region: Secondary | ICD-10-CM | POA: Diagnosis not present

## 2023-12-04 DIAGNOSIS — M9903 Segmental and somatic dysfunction of lumbar region: Secondary | ICD-10-CM | POA: Diagnosis not present

## 2023-12-11 ENCOUNTER — Ambulatory Visit: Payer: BC Managed Care – PPO

## 2023-12-11 DIAGNOSIS — R490 Dysphonia: Secondary | ICD-10-CM

## 2023-12-11 DIAGNOSIS — J383 Other diseases of vocal cords: Secondary | ICD-10-CM | POA: Diagnosis not present

## 2023-12-11 NOTE — Patient Instructions (Signed)
   Practice for 1 minute with belly breath and straw in water (bubbles with kazoo) 1 minute with belly breath and straw (kazoo - feel that buzz in your lips and nose)  5 minutes without straw "mmmmmmmmmmmmoooooooo" (moo)    "Mmmmmmmmmmooooooooooow" (mow)    "Shhhhhhhhhhhhhhhoooooooooo" (shoe)

## 2023-12-11 NOTE — Therapy (Signed)
OUTPATIENT SPEECH LANGUAGE PATHOLOGY VOICE TREATMENT   Patient Name: Carla Morales MRN: [id number] DOB:1961-05-13, 63 y.o., female Today's Date: 12/11/2023  PCP: Jannifer Rodney, FNP REFERRING PROVIDER: Ashok Croon, MD  END OF SESSION:  End of Session - 12/11/23 2348     Visit Number 2    Number of Visits 17    Date for SLP Re-Evaluation 02/22/24    SLP Start Time 1106    SLP Stop Time  1146    SLP Time Calculation (min) 40 min    Activity Tolerance Patient tolerated treatment well              Past Medical History:  Diagnosis Date   Adenomatous colon polyp    Allergy    Arthritis    Asthma    Chondrocalcinosis    COVID-19    Diverticulosis    Elevated alkaline phosphatase level    Elevated antinuclear antibody (ANA) level    Eosinophilic esophagitis    Esophageal dysmotility    Esophageal stenosis    Fatty liver    GERD (gastroesophageal reflux disease)    Hyperlipidemia    Internal hemorrhoids    Migraine    Osteoarthritis    Pinched nerve in neck    Schatzki's ring    Sinus congestion    Urinary incontinence    Past Surgical History:  Procedure Laterality Date   ABDOMINAL HYSTERECTOMY     bladder dialation  1968   as a child   COLONOSCOPY     TONSILLECTOMY  1971   TOTAL VAGINAL HYSTERECTOMY  07/2010   menorrhagia, leiomyoma, prolapse   TUBAL LIGATION  1998   Patient Active Problem List   Diagnosis Date Noted   Bilateral pes planus 10/16/2023   Mouth ulcer 10/10/2023   Eosinophilic esophagitis 10/10/2023   Osteoarthritis of left knee 09/18/2023   Pruritus 03/28/2023   Allergic conjunctivitis of both eyes 03/28/2023   Peripheral edema 07/11/2021   Asthma 01/07/2021   GERD (gastroesophageal reflux disease) 10/29/2019   Vitamin D deficiency 10/29/2019   Family history of breast cancer    Family history of uterine cancer    Family history of lung cancer    Family history of melanoma    Obesity (BMI 30-39.9) 03/29/2018    Perennial allergic rhinitis    Hyperlipidemia     Onset date: 2020, worse 18 months ago  REFERRING DIAG:  R49.0 (ICD-10-CM) - Dysphonia  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 No  FALLS: Has patient fallen in last 6 months? Yes, Number of falls: 1  LIVING ENVIRONMENT: Lives with: lives with their family Lives in: House/apartment  PLOF:Level of assistance: Independent with ADLs, Independent with IADLs Employment: Full-time employment  PATIENT GOALS: Hoarseness going away  OBJECTIVE:  Note: Objective measures were completed at Evaluation unless otherwise noted.  DIAGNOSTIC FINDINGS:  ENT - [facility] 11/12/23 Procedure: The patient was seated upright in the exam chair.   Topical lidocaine and Afrin were applied to the nasal cavity. After adequate anesthesia had occurred, the flexible telescope was passed into the nasal cavity. The nasopharynx was patent without mass  or lesion. The scope was passed behind the soft palate and directed toward the base of tongue. The base of tongue was visualized and was symmetric with no apparent masses or abnormal appearing tissue. There were no signs of a mass or pooling of secretions in the piriform sinuses. The supraglottic structures were normal.   The true vocal cords are mobile. The medial edges were bowed. Closure was incomplete with spindle-shaped glottic gap and supraglottic compression. Periodicity present. The mucosal wave and amplitude were symmetric and present. There is moderate interarytenoid pachydermia  and post cricoid edema. The mucosa appears without lesions.   The laryngoscope was then slowly withdrawn and the patient tolerated the procedure well. There were no complications or blood loss.Assessment and Plan    Chronic dysphonia and Voice Fatigue Recurrent hoarseness and throat pain, exacerbated by talking and singing. Vocal folds appear thin with incomplete closure and mild edema on videostrobe today. No evidence of tumor or mass/no lesions mucosal wave intact. She had significant supraglottic compression and I believe there is an element of muscle tension which might cause throat discomfort/voice fatigue over time.  Discussed voice therapy as a first-line treatment to optimize breath support and reduce symptoms. Potential future interventions include vocal fold filler injection augmentation vs trial of Botox if voice therapy is ineffective and repeat exam will be c/w laryngeal spasm.  - Refer to voice therapy - Optimize reflux control - Continue current management of postnasal drainage - Consider elimination diet for EoE  - trial of Reflux Gourmet   Gastroesophageal Reflux Disease (GERD) GERD managed with Dexilant. Symptoms include throat pain and possible contribution to voice changes. Discussed optimizing reflux control through dietary adjustments and natural supplements. - Continue Dexilant at current dose - Optimize reflux control through dietary adjustments and trial of reflux gourmet   Eosinophilic Esophagitis (EOE) EOE managed with Dupixent and Dexilant. No significant improvement noted on recent endoscopy per report. No formal elimination diet in the past. Discussed the six-food elimination diet to identify specific triggers and improve symptoms. - Provide information on six-food elimination diet - Encourage dietary adjustments to identify specific triggers   Asthma and Environmental Allergies Post-nasal drainage Nasal endoscopy today with S-shaped septum and septal deviation but  no evidence of polyps or no pus to suggest sinusitis  Asthma with episodes of uncontrolled symptoms requiring increased medication. Currently on Dupixent, Dulera, Flonase/Asteline/Ipratropium Bromide. Discussed the importance of rinsing mouth after using inhalers to prevent thrush. - Continue current asthma medications (Dupixent, Dulera) - Monitor for symptom control and adjust medications as needed - continue Flonase/Asteline/Ipratropium Bromide daily 2 puffs b/l nares  - continue Xyzal 5 mg daily    Recurrent Thrush - no evidence of thrush on exam today Recurrent thrush associated with steroid use for asthma. No current evidence of thrush on examination. Discussed the importance of rinsing mouth after using inhalers to prevent recurrence. 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 No current ulceration or significant symptoms noted. Discussed monitoring for any changes or recurrence of symptoms and potential biopsy if ulceration recurs. - Monitor for any changes or recurrence of symptoms - Consider biopsy if ulceration recurs  TREATMENT DATE:  Semi-Occluded Vocal Tract Exercises= SOVTE; Abdominal breathing=AB  12/11/23: Pt had half of a Coke instead of full can of Coke last night. SLP cont to encourage pt to minimize caffeine and incr water (with a decaf drink mix if necessary). Today SLP worked with pt on AB at rest. Today SLP guided pt through SOVTE with straw in water, out of water all focusing on buzz in mouth and nose. SLP then had pt work without straw with /u/, /mmmmu/. SLP drew pt's attention to a more WNL sounding voice quality without the "edge" to it that she had  when she arrived today. Pt stated she felt the difference. Homework: see "pt instructions"  12/03/23 (eval): N/A  PATIENT EDUCATION: Education details: See "clinical impressions" below Person educated: Patient Education method: Explanation Education comprehension: verbalized understanding  HOME EXERCISE PROGRAM: Eventually pt will perform SOVTE, and other voice tasks at home. PhoRTE will also be completed at home after excess tension in pt's voice is treated.   GOALS: Goals reviewed with patient? Yes  SHORT TERM GOALS: Target date: 01/03/24  Pt will complete exercises to reduce vocal tension (SOVTE and others) as directed at least days/week for two weeks via tracking sheet  Baseline: Goal status: INITIAL  2.  Pt will demo improved voice in sentence responses 70% in 3 sessions Baseline:  Goal status: INITIAL  3.  Pt will achieve AB in sentence responses 70% in 3 sessions Baseline:  Goal status: INITIAL  4.  Pt will complete PhoRTE exercises with occasional min mod A Baseline:  Goal status: INITIAL  5.  Pt will incr water intake to at least 48 oz/day at least 5 days in a 7 day period, as monitored by tracking sheet Baseline:  Goal status: INITIAL   LONG TERM GOALS: Target date: 02/22/24  Pt will improve PROM Baseline:  Goal status: INITIAL  2.  Pt will decr s/z ratio to <1.0 Baseline:  Goal status: INITIAL  3.  Pt will complete exercises to improve voice/decr glottal gap as directed at least 6 days/week for two weeks via tracking sheet  Baseline:  Goal status: INITIAL  4.  Pt will incr water intake to at least 60 oz/day at least 5 days in a 7 day period, as monitored by tracking sheet Baseline:  Goal status: INITIAL  5  Pt will demo improved voice in 10 minutes conversation in 3 sessions Baseline:  Goal status: INITIAL  6.  Pt will achieve AB to at least 70% in 10 minutes conversation in 3 sessions Baseline:  Goal status: INITIAL  ASSESSMENT:  CLINICAL  IMPRESSION: Patient is a 63 y.o. 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 See "treatment date" for more details. Pt is highly motivated and based upon her willingness to participate in ST, is a good candidate for skilled ST targeting improved vocal quality.  OBJECTIVE IMPAIRMENTS: include voice disorder. These impairments are limiting patient from return to work, household responsibilities, ADLs/IADLs, and effectively communicating at home and in community. Factors affecting potential to achieve goals and functional outcome are severity of impairments.. Patient will benefit from skilled SLP services to address above impairments and improve overall function.  REHAB POTENTIAL: Good  PLAN:  SLP FREQUENCY: 1-2x/week  SLP DURATION: 10 weeks  PLANNED INTERVENTIONS: Environmental controls, Internal/external aids, Functional tasks, SLP instruction and feedback, Compensatory strategies, Patient/family education, [phone] Treatment of speech (30 or  45 min) , and voice exercises    Khrystina Bonnes, CCC-SLP 12/11/2023, 11:48 PM

## 2023-12-13 ENCOUNTER — Ambulatory Visit: Payer: BC Managed Care – PPO

## 2023-12-13 DIAGNOSIS — R490 Dysphonia: Secondary | ICD-10-CM | POA: Diagnosis not present

## 2023-12-13 DIAGNOSIS — J383 Other diseases of vocal cords: Secondary | ICD-10-CM | POA: Diagnosis not present

## 2023-12-13 NOTE — Therapy (Signed)
OUTPATIENT SPEECH LANGUAGE PATHOLOGY VOICE TREATMENT   Patient Name: Carla Morales MRN: [id number] DOB:[birth year], 63 y.o., female Today's Date: 12/13/2023  PCP: Jannifer Rodney, FNP REFERRING PROVIDER: Ashok Croon, MD  END OF SESSION:  End of Session - 12/13/23 1719     Visit Number 3    Number of Visits 17    Date for SLP Re-Evaluation 02/22/24    SLP Start Time 1622    SLP Stop Time  1704    SLP Time Calculation (min) 42 min    Activity Tolerance Patient tolerated treatment well               Past Medical History:  Diagnosis Date   Adenomatous colon polyp    Allergy    Arthritis    Asthma    Chondrocalcinosis    COVID-19    Diverticulosis    Elevated alkaline phosphatase level    Elevated antinuclear antibody (ANA) level    Eosinophilic esophagitis    Esophageal dysmotility    Esophageal stenosis    Fatty liver    GERD (gastroesophageal reflux disease)    Hyperlipidemia    Internal hemorrhoids    Migraine    Osteoarthritis    Pinched nerve in neck    Schatzki's ring    Sinus congestion    Urinary incontinence    Past Surgical History:  Procedure Laterality Date   ABDOMINAL HYSTERECTOMY     bladder dialation  1968   as a child   COLONOSCOPY     TONSILLECTOMY  1971   TOTAL VAGINAL HYSTERECTOMY  07/2010   menorrhagia, leiomyoma, prolapse   TUBAL LIGATION  1998   Patient Active Problem List   Diagnosis Date Noted   Bilateral pes planus 10/16/2023   Mouth ulcer 10/10/2023   Eosinophilic esophagitis 10/10/2023   Osteoarthritis of left knee 09/18/2023   Pruritus 03/28/2023   Allergic conjunctivitis of both eyes 03/28/2023   Peripheral edema 07/11/2021   Asthma 01/07/2021   GERD (gastroesophageal reflux disease) 10/29/2019   Vitamin D deficiency 10/29/2019   Family history of breast cancer    Family history of uterine cancer    Family history of lung cancer    Family history of melanoma    Obesity (BMI 30-39.9) 03/29/2018    Perennial allergic rhinitis    Hyperlipidemia     Onset date: 2020, worse 18 months ago  REFERRING DIAG:  R49.0 (ICD-10-CM) - Dysphonia  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 No  FALLS: Has patient fallen in last 6 months? Yes, Number of falls: 1  LIVING ENVIRONMENT: Lives with: lives with their family Lives in: House/apartment  PLOF:Level of assistance: Independent with ADLs, Independent with IADLs Employment: Full-time employment  PATIENT GOALS: Hoarseness going away  OBJECTIVE:  Note: Objective measures were completed at Evaluation unless otherwise noted.  DIAGNOSTIC FINDINGS:  ENT - [facility] 11/12/23 Procedure: The patient was seated upright in the exam chair.   Topical lidocaine and Afrin were applied to the nasal cavity. After adequate anesthesia had occurred, the flexible telescope was passed into the nasal cavity. The nasopharynx was patent without  mass or lesion. The scope was passed behind the soft palate and directed toward the base of tongue. The base of tongue was visualized and was symmetric with no apparent masses or abnormal appearing tissue. There were no signs of a mass or pooling of secretions in the piriform sinuses. The supraglottic structures were normal.   The true vocal cords are mobile. The medial edges were bowed. Closure was incomplete with spindle-shaped glottic gap and supraglottic compression. Periodicity present. The mucosal wave and amplitude were symmetric and present. There is moderate interarytenoid pachydermia  and post cricoid edema. The mucosa appears without lesions.   The laryngoscope was then slowly withdrawn and the patient tolerated the procedure well. There were no complications or blood loss.Assessment and Plan    Chronic dysphonia and Voice Fatigue Recurrent hoarseness and throat pain, exacerbated by talking and singing. Vocal folds appear thin with incomplete closure and mild edema on videostrobe today. No evidence of tumor or mass/no lesions mucosal wave intact. She had significant supraglottic compression and I believe there is an element of muscle tension which might cause throat discomfort/voice fatigue over time.  Discussed voice therapy as a first-line treatment to optimize breath support and reduce symptoms. Potential future interventions include vocal fold filler injection augmentation vs trial of Botox if voice therapy is ineffective and repeat exam will be c/w laryngeal spasm.  - Refer to voice therapy - Optimize reflux control - Continue current management of postnasal drainage - Consider elimination diet for EoE  - trial of Reflux Gourmet   Gastroesophageal Reflux Disease (GERD) GERD managed with Dexilant. Symptoms include throat pain and possible contribution to voice changes. Discussed optimizing reflux control through dietary adjustments and natural supplements. - Continue Dexilant at current dose - Optimize reflux control through dietary adjustments and trial of reflux gourmet   Eosinophilic Esophagitis (EOE) EOE managed with Dupixent and Dexilant. No significant improvement noted on recent endoscopy per report. No formal elimination diet in the past. Discussed the six-food elimination diet to identify specific triggers and improve symptoms. - Provide information on six-food elimination diet - Encourage dietary adjustments to identify specific triggers   Asthma and Environmental Allergies Post-nasal drainage Nasal endoscopy today with S-shaped septum and septal deviation but  no evidence of polyps or no pus to suggest sinusitis  Asthma with episodes of uncontrolled symptoms requiring increased medication. Currently on Dupixent, Dulera, Flonase/Asteline/Ipratropium Bromide. Discussed the importance of rinsing mouth after using inhalers to prevent thrush. - Continue current asthma medications (Dupixent, Dulera) - Monitor for symptom control and adjust medications as needed - continue Flonase/Asteline/Ipratropium Bromide daily 2 puffs b/l nares  - continue Xyzal 5 mg daily    Recurrent Thrush - no evidence of thrush on exam today Recurrent thrush associated with steroid use for asthma. No current evidence of thrush on examination. Discussed the importance of rinsing mouth after using inhalers to prevent recurrence. 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 No current ulceration or significant symptoms noted. Discussed monitoring for any changes or recurrence of symptoms and potential biopsy if ulceration recurs. - Monitor for any changes or recurrence of symptoms - Consider biopsy if ulceration recurs  TREATMENT DATE:  Semi-Occluded Vocal Tract Exercises= SOVTE; Abdominal breathing=AB  12/13/23: Pt has been practicing flow phonation but not SOVTE exercises. SLP targeted SOVTE since pt has not done those. SLP used "zh" ("measure") and pt with almost WNL voicing 90% of the time. The syllable "zha" was also successful as well as "zha zha" however "zha mama" was not successful. SLP went back to zha and then also did tongue and lip trills with WNL voicing. Pt was unable to transition to any real words at this time so homework is for "zh", tongue trill and  lip trills with up and down pitch..  12/11/23: Pt had half of a Coke instead of full can of Coke last night. SLP cont to encourage pt to minimize caffeine and incr water (with a decaf drink mix if necessary). Today SLP worked with pt on AB at rest. Today SLP guided pt through SOVTE with straw in water, out of water all focusing on buzz in mouth and nose. SLP then had pt work without straw with /u/, /mmmmu/. SLP drew pt's attention to a more WNL sounding voice quality without the "edge" to it that she had when she arrived today. Pt stated she felt the difference. Homework: see "pt instructions"  12/03/23 (eval): N/A  PATIENT EDUCATION: Education details: See "clinical impressions" below Person educated: Patient Education method: Explanation Education comprehension: verbalized understanding  HOME EXERCISE PROGRAM: Eventually pt will perform SOVTE, and other voice tasks at home. PhoRTE will also be completed at home after excess tension in pt's voice is treated.   GOALS: Goals reviewed with patient? Yes  SHORT TERM GOALS: Target date: 01/03/24  Pt will complete exercises to reduce vocal tension (SOVTE and others) as directed at least days/week for two weeks via tracking sheet  Baseline: Goal status: INITIAL  2.  Pt will demo improved voice in sentence responses 70% in 3 sessions Baseline:  Goal status: INITIAL  3.  Pt will achieve AB in sentence responses 70% in 3 sessions Baseline:  Goal status: INITIAL  4.  Pt will complete PhoRTE exercises with occasional min mod A Baseline:  Goal status: INITIAL  5.  Pt will incr water intake to at least 48 oz/day at least 5 days in a 7 day period, as monitored by tracking sheet Baseline:  Goal status: INITIAL   LONG TERM GOALS: Target date: 02/22/24  Pt will improve PROM Baseline:  Goal status: INITIAL  2.  Pt will decr s/z ratio to <1.0 Baseline:  Goal status: INITIAL  3.  Pt will complete exercises to improve voice/decr glottal gap  as directed at least 6 days/week for two weeks via tracking sheet  Baseline:  Goal status: INITIAL  4.  Pt will incr water intake to at least 60 oz/day at least 5 days in a 7 day period, as monitored by tracking sheet Baseline:  Goal status: INITIAL  5  Pt will demo improved voice in 10 minutes conversation in 3 sessions Baseline:  Goal status: INITIAL  6.  Pt will achieve AB to at least 70% in 10 minutes conversation in 3 sessions Baseline:  Goal status: INITIAL  ASSESSMENT:  CLINICAL IMPRESSION: Patient is a 63 y.o. 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 See "treatment date" for more details. Pt is highly motivated and based upon her willingness to participate in ST, is a good candidate for skilled ST targeting  improved vocal quality.  OBJECTIVE IMPAIRMENTS: include voice disorder. These impairments are limiting patient from return to work, household responsibilities, ADLs/IADLs, and effectively communicating at home and in community. Factors affecting potential to achieve goals and functional outcome are severity of impairments.. Patient will benefit from skilled SLP services to address above impairments and improve overall function.  REHAB POTENTIAL: Good  PLAN:  SLP FREQUENCY: 1-2x/week  SLP DURATION: 10 weeks  PLANNED INTERVENTIONS: Environmental controls, Internal/external aids, Functional tasks, SLP instruction and feedback, Compensatory strategies, Patient/family education, [phone] Treatment of speech (30 or 45 min) , and voice exercises    Simora Dingee, CCC-SLP 12/13/2023, 5:20 PM

## 2023-12-13 NOTE — Patient Instructions (Signed)
    Do this for 10 minutes twice a day  "ZH" going up and down like a siren  "Tongue trill"  up and down like a siren  "Lip trill" (raspberry) up and down like a siren  FEEL THE RELAXED FEELING IN YOUR THROAT

## 2023-12-25 ENCOUNTER — Ambulatory Visit: Payer: BC Managed Care – PPO | Attending: Family

## 2023-12-25 DIAGNOSIS — R490 Dysphonia: Secondary | ICD-10-CM | POA: Insufficient documentation

## 2023-12-25 NOTE — Therapy (Signed)
OUTPATIENT SPEECH LANGUAGE PATHOLOGY VOICE TREATMENT   Patient Name: Carla Morales MRN: [id number] DOB:[birth year], 63 y.o., female Today's Date: 12/25/2023  PCP: Jannifer Rodney, FNP REFERRING PROVIDER: Ashok Croon, MD  END OF SESSION:  End of Session - 12/25/23 0840     Visit Number 4    Number of Visits 17    Date for SLP Re-Evaluation 02/22/24    SLP Start Time 0806    SLP Stop Time  0845    SLP Time Calculation (min) 39 min    Activity Tolerance Patient tolerated treatment well                Past Medical History:  Diagnosis Date   Adenomatous colon polyp    Allergy    Arthritis    Asthma    Chondrocalcinosis    COVID-19    Diverticulosis    Elevated alkaline phosphatase level    Elevated antinuclear antibody (ANA) level    Eosinophilic esophagitis    Esophageal dysmotility    Esophageal stenosis    Fatty liver    GERD (gastroesophageal reflux disease)    Hyperlipidemia    Internal hemorrhoids    Migraine    Osteoarthritis    Pinched nerve in neck    Schatzki's ring    Sinus congestion    Urinary incontinence    Past Surgical History:  Procedure Laterality Date   ABDOMINAL HYSTERECTOMY     bladder dialation  1968   as a child   COLONOSCOPY     TONSILLECTOMY  1971   TOTAL VAGINAL HYSTERECTOMY  07/2010   menorrhagia, leiomyoma, prolapse   TUBAL LIGATION  1998   Patient Active Problem List   Diagnosis Date Noted   Bilateral pes planus 10/16/2023   Mouth ulcer 10/10/2023   Eosinophilic esophagitis 10/10/2023   Osteoarthritis of left knee 09/18/2023   Pruritus 03/28/2023   Allergic conjunctivitis of both eyes 03/28/2023   Peripheral edema 07/11/2021   Asthma 01/07/2021   GERD (gastroesophageal reflux disease) 10/29/2019   Vitamin D deficiency 10/29/2019   Family history of breast cancer    Family history of uterine cancer    Family history of lung cancer    Family history of melanoma    Obesity (BMI 30-39.9) 03/29/2018    Perennial allergic rhinitis    Hyperlipidemia     Onset date: 2020, worse 18 months ago  REFERRING DIAG:  R49.0 (ICD-10-CM) - Dysphonia  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 No  FALLS: Has patient fallen in last 6 months? Yes, Number of falls: 1  LIVING ENVIRONMENT: Lives with: lives with their family Lives in: House/apartment  PLOF:Level of assistance: Independent with ADLs, Independent with IADLs Employment: Full-time employment  PATIENT GOALS: Hoarseness going away  OBJECTIVE:  Note: Objective measures were completed at Evaluation unless otherwise noted.  DIAGNOSTIC FINDINGS:  ENT - [facility] 11/12/23 Procedure: The patient was seated upright in the exam chair.   Topical lidocaine and Afrin were applied to the nasal cavity. After adequate anesthesia had occurred, the flexible telescope was passed into the nasal cavity. The nasopharynx was patent without mass or lesion. The scope  was passed behind the soft palate and directed toward the base of tongue. The base of tongue was visualized and was symmetric with no apparent masses or abnormal appearing tissue. There were no signs of a mass or pooling of secretions in the piriform sinuses. The supraglottic structures were normal.   The true vocal cords are mobile. The medial edges were bowed. Closure was incomplete with spindle-shaped glottic gap and supraglottic compression. Periodicity present. The mucosal wave and amplitude were symmetric and present. There is moderate interarytenoid pachydermia and post cricoid edema. The mucosa  appears without lesions.   The laryngoscope was then slowly withdrawn and the patient tolerated the procedure well. There were no complications or blood loss.Assessment and Plan    Chronic dysphonia and Voice Fatigue Recurrent hoarseness and throat pain, exacerbated by talking and singing. Vocal folds appear thin with incomplete closure and mild edema on videostrobe today. No evidence of tumor or mass/no lesions mucosal wave intact. She had significant supraglottic compression and I believe there is an element of muscle tension which might cause throat discomfort/voice fatigue over time.  Discussed voice therapy as a first-line treatment to optimize breath support and reduce symptoms. Potential future interventions include vocal fold filler injection augmentation vs trial of Botox if voice therapy is ineffective and repeat exam will be c/w laryngeal spasm.  - Refer to voice therapy - Optimize reflux control - Continue current management of postnasal drainage - Consider elimination diet for EoE  - trial of Reflux Gourmet   Gastroesophageal Reflux Disease (GERD) GERD managed with Dexilant. Symptoms include throat pain and possible contribution to voice changes. Discussed optimizing reflux control through dietary adjustments and natural supplements. - Continue Dexilant at current dose - Optimize reflux control through dietary adjustments and trial of reflux gourmet   Eosinophilic Esophagitis (EOE) EOE managed with Dupixent and Dexilant. No significant improvement noted on recent endoscopy per report. No formal elimination diet in the past. Discussed the six-food elimination diet to identify specific triggers and improve symptoms. - Provide information on six-food elimination diet - Encourage dietary adjustments to identify specific triggers   Asthma and Environmental Allergies Post-nasal drainage Nasal endoscopy today with S-shaped septum and septal deviation but no evidence of polyps or no pus to  suggest sinusitis  Asthma with episodes of uncontrolled symptoms requiring increased medication. Currently on Dupixent, Dulera, Flonase/Asteline/Ipratropium Bromide. Discussed the importance of rinsing mouth after using inhalers to prevent thrush. - Continue current asthma medications (Dupixent, Dulera) - Monitor for symptom control and adjust medications as needed - continue Flonase/Asteline/Ipratropium Bromide daily 2 puffs b/l nares  - continue Xyzal 5 mg daily    Recurrent Thrush - no evidence of thrush on exam today Recurrent thrush associated with steroid use for asthma. No current evidence of thrush on examination. Discussed the importance of rinsing mouth after using inhalers to prevent recurrence. 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 No current ulceration or significant symptoms noted. Discussed monitoring for any changes or recurrence of symptoms and potential biopsy if ulceration recurs. - Monitor for any changes or recurrence of symptoms - Consider biopsy if ulceration recurs  TREATMENT DATE:  Semi-Occluded Vocal Tract Exercises= SOVTE; Abdominal breathing=AB  12/25/23: SLP should check H2O intake next session. Pt ran out of Zyzal and wonders if congestion for last few days has anything to do with this as well. SOVTE completed with initial cues to start lower pitch and go to higher pitch. Mostly-WNL voicing heard after 5 reps of lip trills and 5 reps of tongue trills. Pt stated she had been focusing more on AB and feeling a relaxed voice since last session. SLP worked with pt in sentence tasks and she exhibited WNL voice 70% and mostly-WNL voice 25%. AB was used  90% of the time. Homework to practice AB and "relaxed voice" for at least 15 minutes BID.   12/13/23: Pt has been practicing flow phonation but not SOVTE exercises. SLP targeted SOVTE since pt has not done those. SLP used "zh" ("measure") and pt with almost WNL voicing 90% of the time. The syllable "zha" was also successful as well as "zha zha" however "zha mama" was not successful. SLP went back to zha and then also did tongue and lip trills with WNL voicing. Pt was unable to transition to any real words at this time so homework is for "zh", tongue trill and lip trills with up and down pitch.  12/11/23: Pt had half of a Coke instead of full can of Coke last night. SLP cont to encourage pt to minimize caffeine and incr water (with a decaf drink mix if necessary). Today SLP worked with pt on AB at rest. Today SLP guided pt through SOVTE with straw in water, out of water all focusing on buzz in mouth and nose. SLP then had pt work without straw with /u/, /mmmmu/. SLP drew pt's attention to a more WNL sounding voice quality without the "edge" to it that she had when she arrived today. Pt stated she felt the difference. Homework: see "pt instructions"  12/03/23 (eval): N/A  PATIENT EDUCATION: Education details: See "clinical impressions" below Person educated: Patient Education method: Explanation Education comprehension: verbalized understanding  HOME EXERCISE PROGRAM: Eventually pt will perform SOVTE, and other voice tasks at home. PhoRTE will also be completed at home after excess tension in pt's voice is treated.   GOALS: Goals reviewed with patient? Yes  SHORT TERM GOALS: Target date: 01/03/24  Pt will complete exercises to reduce vocal tension (SOVTE and others) as directed at least days/week for two weeks via tracking sheet  Baseline: Goal status: met  2.  Pt will demo improved voice in sentence responses 70% in 3 sessions Baseline:  Goal status: INITIAL  3.  Pt will achieve AB in  sentence responses 70% in 3 sessions Baseline: 12/25/23 Goal status: INITIAL  4.  Pt will complete PhoRTE exercises with occasional min mod A Baseline:  Goal status: INITIAL  5.  Pt will incr water intake to at least 48 oz/day at least 5 days in a 7 day period, as monitored by tracking sheet Baseline:  Goal status: INITIAL   LONG TERM GOALS: Target date: 02/22/24  Pt will improve PROM Baseline:  Goal status: INITIAL  2.  Pt will decr s/z ratio to <1.0 Baseline:  Goal status: INITIAL  3.  Pt will complete exercises to improve voice/decr glottal gap as directed at least 6 days/week for two weeks via tracking sheet  Baseline:  Goal status: INITIAL  4.  Pt will incr water intake to at least 60 oz/day at least 5 days in a 7 day period, as monitored  by tracking sheet Baseline:  Goal status: INITIAL  5  Pt will demo improved voice in 10 minutes conversation in 3 sessions Baseline:  Goal status: INITIAL  6.  Pt will achieve AB to at least 70% in 10 minutes conversation in 3 sessions Baseline:  Goal status: INITIAL  ASSESSMENT:  CLINICAL IMPRESSION: Patient is a 63 y.o. 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 See "treatment date" for more details. Pt is highly motivated and based upon her willingness to participate in ST, is a good candidate for skilled ST targeting improved vocal quality.  OBJECTIVE IMPAIRMENTS: include voice disorder. These impairments are limiting patient from return to work, household responsibilities, ADLs/IADLs, and effectively communicating at home and in community. Factors affecting potential to achieve goals and functional outcome are severity of impairments.. Patient will benefit from skilled SLP services to address above impairments and improve overall function.  REHAB POTENTIAL: Good  PLAN:  SLP  FREQUENCY: 1-2x/week  SLP DURATION: 10 weeks  PLANNED INTERVENTIONS: Environmental controls, Internal/external aids, Functional tasks, SLP instruction and feedback, Compensatory strategies, Patient/family education, [phone] Treatment of speech (30 or 45 min) , and voice exercises    Franklin Clapsaddle, CCC-SLP 12/25/2023, 8:40 AM

## 2023-12-27 ENCOUNTER — Ambulatory Visit: Payer: BC Managed Care – PPO

## 2023-12-27 DIAGNOSIS — R490 Dysphonia: Secondary | ICD-10-CM | POA: Diagnosis not present

## 2023-12-27 NOTE — Therapy (Signed)
 OUTPATIENT SPEECH LANGUAGE PATHOLOGY VOICE TREATMENT   Patient Name: Carla Morales MRN: [id number] DOB:1961/06/08, 63 y.o., female Today's Date: 12/27/2023  PCP: Jannifer Rodney, FNP REFERRING PROVIDER: Ashok Croon, MD  END OF SESSION:  End of Session - 12/27/23 1536     Visit Number 5    Number of Visits 17    Date for SLP Re-Evaluation 02/22/24    SLP Start Time 1535    SLP Stop Time  1615    SLP Time Calculation (min) 40 min    Activity Tolerance Patient tolerated treatment well                 Past Medical History:  Diagnosis Date   Adenomatous colon polyp    Allergy    Arthritis    Asthma    Chondrocalcinosis    COVID-19    Diverticulosis    Elevated alkaline phosphatase level    Elevated antinuclear antibody (ANA) level    Eosinophilic esophagitis    Esophageal dysmotility    Esophageal stenosis    Fatty liver    GERD (gastroesophageal reflux disease)    Hyperlipidemia    Internal hemorrhoids    Migraine    Osteoarthritis    Pinched nerve in neck    Schatzki's ring    Sinus congestion    Urinary incontinence    Past Surgical History:  Procedure Laterality Date   ABDOMINAL HYSTERECTOMY     bladder dialation  1968   as a child   COLONOSCOPY     TONSILLECTOMY  1971   TOTAL VAGINAL HYSTERECTOMY  07/2010   menorrhagia, leiomyoma, prolapse   TUBAL LIGATION  1998   Patient Active Problem List   Diagnosis Date Noted   Bilateral pes planus 10/16/2023   Mouth ulcer 10/10/2023   Eosinophilic esophagitis 10/10/2023   Osteoarthritis of left knee 09/18/2023   Pruritus 03/28/2023   Allergic conjunctivitis of both eyes 03/28/2023   Peripheral edema 07/11/2021   Asthma 01/07/2021   GERD (gastroesophageal reflux disease) 10/29/2019   Vitamin D deficiency 10/29/2019   Family history of breast cancer    Family history of uterine cancer    Family history of lung cancer    Family history of melanoma    Obesity (BMI 30-39.9) 03/29/2018    Perennial allergic rhinitis    Hyperlipidemia     Onset date: 2020, worse 18 months ago  REFERRING DIAG:  R49.0 (ICD-10-CM) - Dysphonia  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 No  FALLS: Has patient fallen in last 6 months? Yes, Number of falls: 1  PATIENT GOALS: Hoarseness going away  OBJECTIVE:  Note: Objective measures were completed at Evaluation unless otherwise noted.  DIAGNOSTIC FINDINGS:  ENT - [facility] 11/12/23 Procedure: The patient was seated upright in the exam chair.   Topical lidocaine and Afrin were applied to the nasal cavity. After adequate anesthesia had occurred, the flexible telescope was passed into the nasal cavity. The nasopharynx was patent without mass or lesion. The scope was passed behind the soft palate and directed toward the base of tongue. The base of tongue was visualized and was symmetric with no apparent masses or abnormal appearing tissue. There were  no signs of a mass or pooling of secretions in the piriform sinuses. The supraglottic structures were normal.   The true vocal cords are mobile. The medial edges were bowed. Closure was incomplete with spindle-shaped glottic gap and supraglottic compression. Periodicity present. The mucosal wave and amplitude were symmetric and present. There is moderate interarytenoid pachydermia and post cricoid edema. The mucosa appears without lesions.   The laryngoscope was then slowly withdrawn and the patient tolerated the procedure well. There were no complications or blood loss.Assessment and Plan    Chronic dysphonia and Voice Fatigue Recurrent  hoarseness and throat pain, exacerbated by talking and singing. Vocal folds appear thin with incomplete closure and mild edema on videostrobe today. No evidence of tumor or mass/no lesions mucosal wave intact. She had significant supraglottic compression and I believe there is an element of muscle tension which might cause throat discomfort/voice fatigue over time.  Discussed voice therapy as a first-line treatment to optimize breath support and reduce symptoms. Potential future interventions include vocal fold filler injection augmentation vs trial of Botox if voice therapy is ineffective and repeat exam will be c/w laryngeal spasm.  - Refer to voice therapy - Optimize reflux control - Continue current management of postnasal drainage - Consider elimination diet for EoE  - trial of Reflux Gourmet   Gastroesophageal Reflux Disease (GERD) GERD managed with Dexilant. Symptoms include throat pain and possible contribution to voice changes. Discussed optimizing reflux control through dietary adjustments and natural supplements. - Continue Dexilant at current dose - Optimize reflux control through dietary adjustments and trial of reflux gourmet   Eosinophilic Esophagitis (EOE) EOE managed with Dupixent and Dexilant. No significant improvement noted on recent endoscopy per report. No formal elimination diet in the past. Discussed the six-food elimination diet to identify specific triggers and improve symptoms. - Provide information on six-food elimination diet - Encourage dietary adjustments to identify specific triggers   Asthma and Environmental Allergies Post-nasal drainage Nasal endoscopy today with S-shaped septum and septal deviation but no evidence of polyps or no pus to suggest sinusitis  Asthma with episodes of uncontrolled symptoms requiring increased medication. Currently on Dupixent, Dulera, Flonase/Asteline/Ipratropium Bromide. Discussed the importance of rinsing mouth after using inhalers  to prevent thrush. - Continue current asthma medications (Dupixent, Dulera) - Monitor for symptom control and adjust medications as needed - continue Flonase/Asteline/Ipratropium Bromide daily 2 puffs b/l nares  - continue Xyzal 5 mg daily    Recurrent Thrush - no evidence of thrush on exam today Recurrent thrush associated with steroid use for asthma. No current evidence of thrush on examination. Discussed the importance of rinsing mouth after using inhalers to prevent recurrence. 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 No current ulceration or significant symptoms noted. Discussed monitoring for any changes or recurrence of symptoms and potential biopsy if ulceration recurs. - Monitor for any changes or recurrence of symptoms - Consider biopsy if ulceration recurs  TREATMENT DATE:  Semi-Occluded Vocal Tract Exercises= SOVTE; Abdominal breathing=AB  12/27/23: SOVTE tasks (lip or tongue trill) paired with word level responses 88% WNL voicing. SLP attempted to incr complexity to sentences read without trill however pt success decr'd to 68%. SLP added trill back again and success improved to 80%. SLP then worked towards not using trill prior to production but pt req'd use of trill for success > 80%. Homework for lip/tongue trill with words x10, then practice with lip/tongue trill with either word or phrase response x10 minutes (BID) or x15 minutes (every day)  12/25/23: SLP should check H2O intake next session. Pt ran out of Zyzal and wonders if congestion for last few days has anything to do with this as well. SOVTE completed with initial cues to start lower pitch and go to  higher pitch. Mostly-WNL voicing heard after 5 reps of lip trills and 5 reps of tongue trills. Pt stated she had been focusing more on AB and feeling a relaxed voice since last session. SLP worked with pt in sentence tasks and she exhibited WNL voice 70% and mostly-WNL voice 25%. AB was used 90% of the time. Homework to practice AB and "relaxed voice" for at least 15 minutes BID.   12/13/23: Pt has been practicing flow phonation but not SOVTE exercises. SLP targeted SOVTE since pt has not done those. SLP used "zh" ("measure") and pt with almost WNL voicing 90% of the time. The syllable "zha" was also successful as well as "zha zha" however "zha mama" was not successful. SLP went back to zha and then also did tongue and lip trills with WNL voicing. Pt was unable to transition to any real words at this time so homework is for "zh", tongue trill and lip trills with up and down pitch.  12/11/23: Pt had half of a Coke instead of full can of Coke last night. SLP cont to encourage pt to minimize caffeine and incr water (with a decaf drink mix if necessary). Today SLP worked with pt on AB at rest. Today SLP guided pt through SOVTE with straw in water, out of water all focusing on buzz in mouth and nose. SLP then had pt work without straw with /u/, /mmmmu/. SLP drew pt's attention to a more WNL sounding voice quality without the "edge" to it that she had when she arrived today. Pt stated she felt the difference. Homework: see "pt instructions"  12/03/23 (eval): N/A  PATIENT EDUCATION: Education details: See "clinical impressions" below Person educated: Patient Education method: Explanation Education comprehension: verbalized understanding  HOME EXERCISE PROGRAM: Eventually pt will perform SOVTE, and other voice tasks at home. PhoRTE will also be completed at home after excess tension in pt's voice is treated.   GOALS: Goals reviewed with patient? Yes  SHORT TERM GOALS: Target date: 01/03/24  Pt will  complete exercises to reduce vocal tension (SOVTE and others) as directed at least days/week for two weeks via tracking sheet  Baseline: Goal status: met  2.  Pt will demo improved voice in sentence responses 70% in 3 sessions Baseline:  Goal status: INITIAL  3.  Pt will achieve AB in sentence responses 70% in 3 sessions Baseline: 12/25/23 Goal status: INITIAL  4.  Pt will complete PhoRTE exercises with occasional min mod A Baseline:  Goal status: INITIAL  5.  Pt will incr water intake to at least 48 oz/day at least 5 days in a 7 day period, as monitored by tracking sheet Baseline:  Goal status:  INITIAL   LONG TERM GOALS: Target date: 02/22/24  Pt will improve PROM Baseline:  Goal status: INITIAL  2.  Pt will decr s/z ratio to <1.0 Baseline:  Goal status: INITIAL  3.  Pt will complete exercises to improve voice/decr glottal gap as directed at least 6 days/week for two weeks via tracking sheet  Baseline:  Goal status: INITIAL  4.  Pt will incr water intake to at least 60 oz/day at least 5 days in a 7 day period, as monitored by tracking sheet Baseline:  Goal status: INITIAL  5  Pt will demo improved voice in 10 minutes conversation in 3 sessions Baseline:  Goal status: INITIAL  6.  Pt will achieve AB to at least 70% in 10 minutes conversation in 3 sessions Baseline:  Goal status: INITIAL  ASSESSMENT:  CLINICAL IMPRESSION: Patient is a 63 y.o. 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 See "treatment date" for more details. Pt is highly motivated and based upon her willingness to participate in ST, is a good candidate for skilled ST targeting improved vocal quality.  OBJECTIVE IMPAIRMENTS: include voice disorder. These impairments are limiting patient from return to work, household responsibilities, ADLs/IADLs, and effectively  communicating at home and in community. Factors affecting potential to achieve goals and functional outcome are severity of impairments.. Patient will benefit from skilled SLP services to address above impairments and improve overall function.  REHAB POTENTIAL: Good  PLAN:  SLP FREQUENCY: 1-2x/week  SLP DURATION: 10 weeks  PLANNED INTERVENTIONS: Environmental controls, Internal/external aids, Functional tasks, SLP instruction and feedback, Compensatory strategies, Patient/family education, [phone] Treatment of speech (30 or 45 min) , and voice exercises    Breann Losano, CCC-SLP 12/27/2023, 3:37 PM

## 2023-12-31 ENCOUNTER — Ambulatory Visit: Payer: BC Managed Care – PPO

## 2023-12-31 DIAGNOSIS — R490 Dysphonia: Secondary | ICD-10-CM

## 2023-12-31 NOTE — Therapy (Signed)
 OUTPATIENT SPEECH LANGUAGE PATHOLOGY VOICE TREATMENT   Patient Name: Carla Morales MRN: [id number] DOB:06-Mar-1961, 63 y.o., female Today's Date: 12/31/2023  PCP: Jannifer Rodney, FNP REFERRING PROVIDER: Ashok Croon, MD  END OF SESSION:  End of Session - 12/31/23 1105     Visit Number 6    Number of Visits 17    Date for SLP Re-Evaluation 02/22/24    SLP Start Time 1019    SLP Stop Time  1056    SLP Time Calculation (min) 37 min    Activity Tolerance Patient tolerated treatment well                  Past Medical History:  Diagnosis Date   Adenomatous colon polyp    Allergy    Arthritis    Asthma    Chondrocalcinosis    COVID-19    Diverticulosis    Elevated alkaline phosphatase level    Elevated antinuclear antibody (ANA) level    Eosinophilic esophagitis    Esophageal dysmotility    Esophageal stenosis    Fatty liver    GERD (gastroesophageal reflux disease)    Hyperlipidemia    Internal hemorrhoids    Migraine    Osteoarthritis    Pinched nerve in neck    Schatzki's ring    Sinus congestion    Urinary incontinence    Past Surgical History:  Procedure Laterality Date   ABDOMINAL HYSTERECTOMY     bladder dialation  1968   as a child   COLONOSCOPY     TONSILLECTOMY  1971   TOTAL VAGINAL HYSTERECTOMY  07/2010   menorrhagia, leiomyoma, prolapse   TUBAL LIGATION  1998   Patient Active Problem List   Diagnosis Date Noted   Bilateral pes planus 10/16/2023   Mouth ulcer 10/10/2023   Eosinophilic esophagitis 10/10/2023   Osteoarthritis of left knee 09/18/2023   Pruritus 03/28/2023   Allergic conjunctivitis of both eyes 03/28/2023   Peripheral edema 07/11/2021   Asthma 01/07/2021   GERD (gastroesophageal reflux disease) 10/29/2019   Vitamin D deficiency 10/29/2019   Family history of breast cancer    Family history of uterine cancer    Family history of lung cancer    Family history of melanoma    Obesity (BMI 30-39.9) 03/29/2018    Perennial allergic rhinitis    Hyperlipidemia     Onset date: 2020, worse 18 months ago  REFERRING DIAG:  R49.0 (ICD-10-CM) - Dysphonia  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 No  FALLS: Has patient fallen in last 6 months? Yes, Number of falls: 1  PATIENT GOALS: Hoarseness going away  OBJECTIVE:  Note: Objective measures were completed at Evaluation unless otherwise noted.  DIAGNOSTIC FINDINGS:  ENT - [place] 11/12/23 Procedure: The patient was seated upright in the exam chair.   Topical lidocaine and Afrin were applied to the nasal cavity. After adequate anesthesia had occurred, the flexible telescope was passed into the nasal cavity. The nasopharynx was patent without mass or lesion. The scope was passed behind the soft palate and directed toward the base of tongue. The base of tongue was visualized and was symmetric with no apparent masses or abnormal appearing tissue.  There were no signs of a mass or pooling of secretions in the piriform sinuses. The supraglottic structures were normal.   The true vocal cords are mobile. The medial edges were bowed. Closure was incomplete with spindle-shaped glottic gap and supraglottic compression. Periodicity present. The mucosal wave and amplitude were symmetric and present. There is moderate interarytenoid pachydermia and post cricoid edema. The mucosa appears without lesions.   The laryngoscope was then slowly withdrawn and the patient tolerated the procedure well. There were no complications or blood loss.Assessment and Plan    Chronic dysphonia and Voice Fatigue Recurrent  hoarseness and throat pain, exacerbated by talking and singing. Vocal folds appear thin with incomplete closure and mild edema on videostrobe today. No evidence of tumor or mass/no lesions mucosal wave intact. She had significant supraglottic compression and I believe there is an element of muscle tension which might cause throat discomfort/voice fatigue over time.  Discussed voice therapy as a first-line treatment to optimize breath support and reduce symptoms. Potential future interventions include vocal fold filler injection augmentation vs trial of Botox if voice therapy is ineffective and repeat exam will be c/w laryngeal spasm.  - Refer to voice therapy - Optimize reflux control - Continue current management of postnasal drainage - Consider elimination diet for EoE  - trial of Reflux Gourmet   Gastroesophageal Reflux Disease (GERD) GERD managed with Dexilant. Symptoms include throat pain and possible contribution to voice changes. Discussed optimizing reflux control through dietary adjustments and natural supplements. - Continue Dexilant at current dose - Optimize reflux control through dietary adjustments and trial of reflux gourmet   Eosinophilic Esophagitis (EOE) EOE managed with Dupixent and Dexilant. No significant improvement noted on recent endoscopy per report. No formal elimination diet in the past. Discussed the six-food elimination diet to identify specific triggers and improve symptoms. - Provide information on six-food elimination diet - Encourage dietary adjustments to identify specific triggers   Asthma and Environmental Allergies Post-nasal drainage Nasal endoscopy today with S-shaped septum and septal deviation but no evidence of polyps or no pus to suggest sinusitis  Asthma with episodes of uncontrolled symptoms requiring increased medication. Currently on Dupixent, Dulera, Flonase/Asteline/Ipratropium Bromide. Discussed the importance of rinsing mouth after using inhalers  to prevent thrush. - Continue current asthma medications (Dupixent, Dulera) - Monitor for symptom control and adjust medications as needed - continue Flonase/Asteline/Ipratropium Bromide daily 2 puffs b/l nares  - continue Xyzal 5 mg daily    Recurrent Thrush - no evidence of thrush on exam today Recurrent thrush associated with steroid use for asthma. No current evidence of thrush on examination. Discussed the importance of rinsing mouth after using inhalers to prevent recurrence. 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 No current ulceration or significant symptoms noted. Discussed monitoring for any changes or recurrence of symptoms and potential biopsy if ulceration recurs. - Monitor for any changes or recurrence of symptoms - Consider biopsy if ulceration recurs  TREATMENT DATE:  Semi-Occluded Vocal Tract Exercises= SOVTE; Abdominal breathing=AB  12/31/23: Pt practiced once Friday morning, and twice Sunday and once in car coming to ST today. Pt appears to be dealing with persistent phlegm from a URI/cold last week. Clear voice observed 70% of the time with reading sentences.  SLP introduced pt to PhoRTE today and pt req'd mod A occasionally faded to rare min A. She req'd more cues for wider pitch range with numbers and lower voice for reading sentences task. See "pt instructions" for details.   12/27/23: SOVTE tasks (lip or tongue trill) paired with word level responses 88% WNL voicing. SLP attempted to incr complexity to sentences read without trill however pt success decr'd to 68%. SLP added trill back again and success improved to 80%. SLP then worked towards not using  trill prior to production but pt req'd use of trill for success > 80%. Homework for lip/tongue trill with words x10, then practice with lip/tongue trill with either word or phrase response x10 minutes (BID) or x15 minutes (every day).  12/25/23: SLP should check H2O intake next session. Pt ran out of Zyzal and wonders if congestion for last few days has anything to do with this as well. SOVTE completed with initial cues to start lower pitch and go to higher pitch. Mostly-WNL voicing heard after 5 reps of lip trills and 5 reps of tongue trills. Pt stated she had been focusing more on AB and feeling a relaxed voice since last session. SLP worked with pt in sentence tasks and she exhibited WNL voice 70% and mostly-WNL voice 25%. AB was used 90% of the time. Homework to practice AB and "relaxed voice" for at least 15 minutes BID.   12/13/23: Pt has been practicing flow phonation but not SOVTE exercises. SLP targeted SOVTE since pt has not done those. SLP used "zh" ("measure") and pt with almost WNL voicing 90% of the time. The syllable "zha" was also successful as well as "zha zha" however "zha mama" was not successful. SLP went back to zha and then also did tongue and lip trills with WNL voicing. Pt was unable to transition to any real words at this time so homework is for "zh", tongue trill and lip trills with up and down pitch.  12/11/23: Pt had half of a Coke instead of full can of Coke last night. SLP cont to encourage pt to minimize caffeine and incr water (with a decaf drink mix if necessary). Today SLP worked with pt on AB at rest. Today SLP guided pt through SOVTE with straw in water, out of water all focusing on buzz in mouth and nose. SLP then had pt work without straw with /u/, /mmmmu/. SLP drew pt's attention to a more WNL sounding voice quality without the "edge" to it that she had when she arrived today. Pt stated she felt the difference. Homework: see "pt instructions"  12/03/23 (eval):  N/A  PATIENT EDUCATION: Education details: See "clinical impressions" below Person educated: Patient Education method: Explanation, Demonstration, Verbal cues, and Handouts Education comprehension: verbalized understanding, returned demonstration, verbal cues required, and needs further education  HOME EXERCISE PROGRAM: SOVTE, and other voice tasks at home. PhoRTE.   GOALS: Goals reviewed with patient? Yes  SHORT TERM GOALS: Target date: 01/03/24  Pt will complete exercises to reduce vocal tension (SOVTE and others) as directed at least days/week for two weeks via tracking sheet  Baseline: Goal status: met  2.  Pt will demo improved voice in sentence  responses 70% in 3 sessions Baseline:  Goal status: INITIAL  3.  Pt will achieve AB in sentence responses 70% in 3 sessions Baseline: 12/25/23, 12/31/23 Goal status: INITIAL  4.  Pt will complete PhoRTE exercises with occasional min mod A Baseline:  Goal status: INITIAL  5.  Pt will incr water intake to at least 48 oz/day at least 5 days in a 7 day period, as monitored by tracking sheet Baseline:  Goal status: INITIAL   LONG TERM GOALS: Target date: 02/22/24  Pt will improve PROM Baseline:  Goal status: INITIAL  2.  Pt will decr s/z ratio to <1.0 Baseline:  Goal status: INITIAL  3.  Pt will complete exercises to improve voice/decr glottal gap as directed at least 6 days/week for two weeks via tracking sheet  Baseline:  Goal status: INITIAL  4.  Pt will incr water intake to at least 60 oz/day at least 5 days in a 7 day period, as monitored by tracking sheet Baseline:  Goal status: INITIAL  5  Pt will demo improved voice in 10 minutes conversation in 3 sessions Baseline:  Goal status: INITIAL  6.  Pt will achieve AB to at least 70% in 10 minutes conversation in 3 sessions Baseline:  Goal status: INITIAL  ASSESSMENT:  CLINICAL IMPRESSION: Patient is a 63 y.o. F who was seen today for treatment of voice disorder  c/b incomplete glottic closure (spindle-shaped gap), voice fatigue, and excess muscle tension (supraglottic compression) when voicing. See "treatment date" for more details. She was taught PhoRTE today. Pt is highly motivated and based upon her willingness to participate in ST, is a good candidate for skilled ST targeting improved vocal quality.  OBJECTIVE IMPAIRMENTS: include voice disorder. These impairments are limiting patient from return to work, household responsibilities, ADLs/IADLs, and effectively communicating at home and in community. Factors affecting potential to achieve goals and functional outcome are severity of impairments.. Patient will benefit from skilled SLP services to address above impairments and improve overall function.  REHAB POTENTIAL: Good  PLAN:  SLP FREQUENCY: 1-2x/week  SLP DURATION: 10 weeks  PLANNED INTERVENTIONS: Environmental controls, Internal/external aids, Functional tasks, SLP instruction and feedback, Compensatory strategies, Patient/family education, [phone] Treatment of speech (30 or 45 min) , and voice exercises    Aylana Hirschfeld, CCC-SLP 12/31/2023, 11:06 AM

## 2023-12-31 NOTE — Patient Instructions (Signed)
    Practice 5 abdominal breaths before you practice PHoRTE exercises  Sing using a straw for 90 seconds before before you practice PHoRTE exercises   PHoRTE VOICE EXERCISES - DO TWICE EACH DAY  1) Hold "AHHHHHHHHHHHHH" 10 times   As long as you can as strong as you can - push from your abdominal muscles!   [email or web address]  2) Count 1-10   Start at as low pitch as you can, glide to as high pitch as you can, then back down as low as you can   [email or web address]  3) Say 10 sentences -two different ways   (a) like you are calling over the fence to your neighbor in a higher-pitch voice  (b) like you are scolding your dog in a LOW authoritative voice   [email or web address]

## 2024-01-04 ENCOUNTER — Telehealth: Payer: Self-pay

## 2024-01-04 NOTE — Telephone Encounter (Signed)
 Pharmacy Patient Advocate Encounter   Received notification from CoverMyMeds that prior authorization for Eohilia 2MG /10ML suspension is required/requested.   Insurance verification completed.   The patient is insured through [facility] .   Per test claim: went to submit prior authorization and plan states it is not required. Even though it was sent to [place] for renewnal.

## 2024-01-07 ENCOUNTER — Ambulatory Visit: Payer: BC Managed Care – PPO

## 2024-01-07 ENCOUNTER — Encounter (INDEPENDENT_AMBULATORY_CARE_PROVIDER_SITE_OTHER): Payer: Self-pay | Admitting: Otolaryngology

## 2024-01-07 ENCOUNTER — Ambulatory Visit (INDEPENDENT_AMBULATORY_CARE_PROVIDER_SITE_OTHER): Payer: BC Managed Care – PPO | Admitting: Otolaryngology

## 2024-01-07 ENCOUNTER — Other Ambulatory Visit (HOSPITAL_COMMUNITY): Payer: Self-pay

## 2024-01-07 VITALS — BP 127/78 | HR 97

## 2024-01-07 DIAGNOSIS — K219 Gastro-esophageal reflux disease without esophagitis: Secondary | ICD-10-CM | POA: Diagnosis not present

## 2024-01-07 DIAGNOSIS — R0982 Postnasal drip: Secondary | ICD-10-CM | POA: Diagnosis not present

## 2024-01-07 DIAGNOSIS — K200 Eosinophilic esophagitis: Secondary | ICD-10-CM | POA: Diagnosis not present

## 2024-01-07 DIAGNOSIS — J383 Other diseases of vocal cords: Secondary | ICD-10-CM

## 2024-01-07 DIAGNOSIS — R490 Dysphonia: Secondary | ICD-10-CM

## 2024-01-07 DIAGNOSIS — J3089 Other allergic rhinitis: Secondary | ICD-10-CM | POA: Diagnosis not present

## 2024-01-07 NOTE — Patient Instructions (Signed)
-   Take Reflux Gourmet (natural supplement available on Amazon) to help with symptoms of chronic throat irritation      [email or web address] - check out this website to learn more about reflux   -Avoid lying down for at least two hours after a meal or after drinking acidic beverages, like soda, or other caffeinated beverages. This can help to prevent stomach contents from flowing back into the esophagus. -Keep your head elevated while you sleep. Using an extra pillow or two can also help to prevent reflux. -Eat smaller and more frequent meals each day instead of a few large meals. This promotes digestion and can aid in preventing heartburn. -Wear loose-fitting clothes to ease pressure on the stomach, which can worsen heartburn and reflux. -Reduce excess weight around the midsection. This can ease pressure on the stomach. Such pressure can force some stomach contents back up the esophagus

## 2024-01-07 NOTE — Progress Notes (Signed)
 ENT Progress Note:   Update 01/07/2024  Discussed the use of AI scribe software for clinical note transcription with the patient, who gave verbal consent to proceed.  History of Present Illness   Carla Morales is a 63 year old female with hx of GERD, eosinophilic esophagitis/dysphonia who presents for follow-up after voice therapy.  She is undergoing voice therapy, initially set up for six weeks with sessions twice a week for four weeks and once a week thereafter. She has not been able to attend twice weekly sessions consistently. She experiences improvement during the sessions, although the benefits do not persist.  She experiences nasal congestion and post-nasal drainage that settles in her throat. She is currently taking Dexilant for reflux but has not started the reflux gourmet supplement discussed previously. She continues to use Dupixent, Dulera, Flonase, Astelin, and ipratropium bromide (Atrovent).  She has a history of eosinophilic esophagitis and has been managing her symptoms by decreasing certain foods, increasing water intake, and reducing caffeine consumption to low levels in the morning.  She experiences occasional ulcers on the roof of her mouth that resolve spontaneously. She has started using Vaseline and saline sprays to help with dryness in her nose, especially since the heating is on and the air is dry.  She is using a humidifier at home and work to help with her symptoms.     Records Reviewed:  Initial Evaluation  Reason for Consult: ulcer on the roof of her mouth   HPI: Discussed the use of AI scribe software for clinical note transcription with the patient, who gave verbal consent to proceed.  History of Present Illness   The patient is a 49 yoF, with a history of asthma, gastroesophageal reflux disease (GERD), and eosinophilic esophagitis (EOE), presents with a chief complaint of dysphonia/voice changes, throat discomfort an a painful mouth ulcer she  developed over the roof of her mouth, which has since resolved. The mouth ulcer was located on the roof of the mouth and was extremely sensitive to acidic and spicy foods. The patient also reports a persistent hoarseness for the past year and a half, which predates the initiation of Dupixent for EOE.  The patient has been on multiple medications, including Dupixent, Dulera, Flonase, and Astelin, for the management of asthma/environmental allergies and EOE. They have had recurrent episodes of thrush, which have been more frequent before the initiation of Dulera. The patient also reports a history of non-stop coughing, which has been managed with prednisone and was thought to be related to uncontrolled asthma.  The patient also reports pain in the back of the throat, particularly when talking a lot or attempting to sing. This pain is described as different from a typical sore throat and does not seem to be related to postnasal drainage. The patient also reports sensitivity on the edges of the tongue to acidic foods.  The patient has been diagnosed with EOE and GERD, and is currently on Dexilant for the management of these conditions. They have undergone an upper endoscopy twice, with the most recent one showing no significant changes. The patient has not made any significant dietary changes, but has reduced the intake of soft drinks. She never tried elimination diet for EoE.   The patient's asthma is currently under control with Dulera, and they report no shortness of breath or trouble swallowing since starting this medication. They have been diagnosed with allergies to dust mites and mold, and are currently on Xyzal for this. The patient uses albuterol infrequently,  only when they get sick. They have learned to manage their breathlessness by stopping and focusing on their breathing.    Asthma and Allergy Office Visit  Chief Complaint: Allergies (Hoarse and red painful spot on roof of mouth that she noticed  on Saturday )   HPI Carla Morales is a 62 year old female who presents to the clinic for evaluation of a red spot in her mouth.  She was last seen in this clinic on 08/13/2023 by Dr. Maurine Minister for evaluation of asthma, allergic rhinitis, allergic conjunctivitis, pruritus, and EOE on Dupixent for which she follows up with her GI specialist.     In the interim, she reports that her building is undergoing mold remediation procedures including chemical treatments which she reports has flared her asthma.  About the same time, she noted that the top of her mouth felt sore (Saturday).  Using a flashlight she looked at the top of her mouth and noted a small red area.  She reports this area grew in size over the net few days and became more painful each day.  She reports that she is currently unable to eat solid foods and is eating pure or drinking liquids at this time.  She does not have a history of drinking alcohol or using nicotine products.  She denies recent fever, sweats, chills, or sick contacts.     Asthma is reported as moderately well-controlled with recent symptoms including chest tightness, shortness of breath, and cough producing clear mucus.  She continues Dulera 200-2 puffs twice a day with a spacer and rarely needs to use albuterol for relief of symptoms.  She does report that she used albuterol several times last week for relief of asthma symptoms.     Allergic rhinitis is reported as moderately well-controlled with nasal congestion Anne headache that occurred last week.  More constant symptoms include occasional sneezing and postnasal drainage.  She continues Xyzol 5 mg once a day, Flonase daily, ipratropium daily, and azelastine daily.  She frequently uses nasal saline rinses which she reports are very effective.  Her last environmental allergy skin testing was on 03/28/2023 and was positive to dust mite and mold.    Allergic conjunctivitis is reported as moderately well-controlled with  occasional itching and redness occurring only last week.  She continues olopatadine with relief of symptoms.    She continues to follow up with her GI specialist for treatment of EOE.  She continues Dupixent weekly with excellent relief of EOE symptoms.  She is not currently avoiding any foods.  Her last selected food allergy skin testing was on 03/28/2023 and was negative to foods tested.   She reports occasional pruritus for which she continues Xyzol in the evening and rarely uses hydroxyzine for relief of symptoms.   Her current medications are listed in the chart.     Past Medical History:  Diagnosis Date   Adenomatous colon polyp    Allergy    Arthritis    Asthma    Chondrocalcinosis    COVID-19    Diverticulosis    Elevated alkaline phosphatase level    Elevated antinuclear antibody (ANA) level    Eosinophilic esophagitis    Esophageal dysmotility    Esophageal stenosis    Fatty liver    GERD (gastroesophageal reflux disease)    Hyperlipidemia    Internal hemorrhoids    Migraine    Osteoarthritis    Pinched nerve in neck    Schatzki's ring    Sinus congestion  Urinary incontinence     Past Surgical History:  Procedure Laterality Date   ABDOMINAL HYSTERECTOMY     bladder dialation  1968   as a child   COLONOSCOPY     TONSILLECTOMY  1971   TOTAL VAGINAL HYSTERECTOMY  07/2010   menorrhagia, leiomyoma, prolapse   TUBAL LIGATION  1998    Family History  Problem Relation Age of Onset   Allergic rhinitis Mother    Asthma Mother    Rheum arthritis Mother    Fibromyalgia Mother    Cancer Mother        breast, uterine, adenocarcinoma-right lung   Uterine cancer Mother        Sarcoma   Lung cancer Mother        Adenocarcinoma   Breast cancer Mother 78   Hypertension Father    Melanoma Father 43   Asthma Sister    Asthma Maternal Aunt    Diabetes Maternal Aunt    Cancer Maternal Aunt 72       GYN cancer - possibly vulvar?   Lung cancer Maternal Aunt     Diabetes Maternal Uncle    Appendicitis Maternal Uncle        d. 12   Diabetes Paternal Aunt    Breast cancer Paternal Aunt 4   Breast cancer Paternal Aunt 30   Uterine cancer Paternal Aunt 73   Breast cancer Paternal Aunt 10   Diabetes Paternal Uncle    Other Maternal Grandmother 30       childbirth   Heart disease Maternal Grandfather    Cancer Paternal Grandmother        unknown   Other Daughter        Phyllodes tumor   Other Nephew        Gilbert's Disease   Colon cancer Neg Hx    Esophageal cancer Neg Hx    Ulcerative colitis Neg Hx    Stomach cancer Neg Hx    Pancreatic cancer Neg Hx     Social History:  reports that she has never smoked. She has never been exposed to tobacco smoke. She has never used smokeless tobacco. She reports that she does not drink alcohol and does not use drugs.  Allergies:  Allergies  Allergen Reactions   Other Swelling    Contrast dye   Contrast Media [Iodinated Contrast Media]    Dust Mite Extract    Haldol [Haloperidol Decanoate]     anxiety   Molds & Smuts    Pravastatin Other (See Comments)    myalgias   Sulfa Antibiotics Rash    Medications: I have reviewed the patient's current medications.  The PMH, PSH, Medications, Allergies, and SH were reviewed and updated.  ROS: Constitutional: Negative for fever, weight loss and weight gain. Cardiovascular: Negative for chest pain and dyspnea on exertion. Respiratory: Is not experiencing shortness of breath at rest. Gastrointestinal: Negative for nausea and vomiting. Neurological: Negative for headaches. Psychiatric: The patient is not nervous/anxious  Blood pressure 127/78, pulse 97, last menstrual period 07/24/2010, SpO2 96%.  PHYSICAL EXAM:  Exam: General: Well-developed, well-nourished Communication and Voice: mildly raspy Respiratory Respiratory effort: Equal inspiration and expiration without stridor Cardiovascular Peripheral Vascular: Warm extremities with equal  color/perfusion Eyes: No nystagmus with equal extraocular motion bilaterally Neuro/Psych/Balance: Patient oriented to person, place, and time; Appropriate mood and affect; Gait is intact with no imbalance; Cranial nerves I-XII are intact Head and Face Inspection: Normocephalic and atraumatic without mass or lesion Palpation: Facial skeleton  intact without bony stepoffs Salivary Glands: No mass or tenderness Facial Strength: Facial motility symmetric and full bilaterally ENT Pinna: External ear intact and fully developed External canal: Canal is patent with intact skin Tympanic Membrane: Clear and mobile External Nose: No scar or anatomic deformity Internal Nose: Septum is deviated to the left and S-shaped septum. No polyp, or purulence. Mucosal edema and erythema present.  Bilateral inferior turbinate hypertrophy.  Lips, Teeth, and gums: Mucosa and teeth intact and viable TMJ: No pain to palpation with full mobility Oral cavity/oropharynx: No erythema or exudate, no lesions present Neck Neck and Trachea: Midline trachea without mass or lesion Thyroid: No mass or nodularity Lymphatics: No lymphadenopathy  Studies Reviewed: CXR 04/21/23 FINDINGS: The heart size and mediastinal contours are within normal limits. Both lungs are clear. The visualized skeletal structures are unremarkable.   IMPRESSION: No active cardiopulmonary disease  Esophagram 11/02/23EXAM: ESOPHAGUS/BARIUM SWALLOW/TABLET STUDY   TECHNIQUE: A combined double and single contrast examination was performed using effervescent crystals, high-density barium, and thin liquid barium.   FLUOROSCOPY: Fluoroscopy time: 2 minutes, 30 seconds (22.1 mGy).   COMPARISON:  No pertinent prior exams available for comparison.   FINDINGS: Fluoroscopic evaluation demonstrates normal caliber and smooth contour of the esophagus. No evidence of fixed stricture, mass or mucosal abnormality.   Mild intermittent esophageal  dysmotility with tertiary contractions.   No appreciable hiatal hernia.   No gastroesophageal reflux was observed.   The patient swallowed a 13 mm barium tablet, which freely passed into the stomach.   The exam was performed by Alwyn Ren, NP , and was supervised and interpreted by Dr. Jackey Loge.   IMPRESSION: Mild esophageal dysmotility.   Otherwise unremarkable esophagram, as described.      Assessment/Plan: Encounter Diagnoses  Name Primary?   Post-nasal drip Yes   Glottic insufficiency    Eosinophilic esophagitis    Chronic GERD    Dysphonia    Age-related vocal fold atrophy    Environmental and seasonal allergies      Assessment and Plan    Chronic dysphonia and Voice Fatigue Recurrent hoarseness and throat pain, exacerbated by talking and singing. Vocal folds appear thin with incomplete closure and mild edema on videostrobe today. No evidence of tumor or mass/no lesions mucosal wave intact. She had significant supraglottic compression and I believe there is an element of muscle tension which might cause throat discomfort/voice fatigue over time.  Discussed voice therapy as a first-line treatment to optimize breath support and reduce symptoms. Potential future interventions include vocal fold filler injection augmentation vs trial of Botox if voice therapy is ineffective and repeat exam will be c/w laryngeal spasm.  - Refer to voice therapy - Optimize reflux control - Continue current management of postnasal drainage - Consider elimination diet for EoE  - trial of Reflux Gourmet  Gastroesophageal Reflux Disease (GERD) GERD managed with Dexilant. Symptoms include throat pain and possible contribution to voice changes. Discussed optimizing reflux control through dietary adjustments and natural supplements. - Continue Dexilant at current dose - Optimize reflux control through dietary adjustments and trial of reflux gourmet  Eosinophilic Esophagitis (EOE) EOE  managed with Dupixent and Dexilant. No significant improvement noted on recent endoscopy per report. No formal elimination diet in the past. Discussed the six-food elimination diet to identify specific triggers and improve symptoms. - Provide information on six-food elimination diet - Encourage dietary adjustments to identify specific triggers  Asthma and Environmental Allergies Post-nasal drainage Nasal endoscopy today with S-shaped septum and septal  deviation but no evidence of polyps or no pus to suggest sinusitis  Asthma with episodes of uncontrolled symptoms requiring increased medication. Currently on Dupixent, Dulera, Flonase/Asteline/Ipratropium Bromide. Discussed the importance of rinsing mouth after using inhalers to prevent thrush. - Continue current asthma medications (Dupixent, Dulera) - Monitor for symptom control and adjust medications as needed - continue Flonase/Asteline/Ipratropium Bromide daily 2 puffs b/l nares  - continue Xyzal 5 mg daily   Recurrent Thrush - no evidence of thrush on exam today Recurrent thrush associated with steroid use for asthma. No current evidence of thrush on examination. Discussed the importance of rinsing mouth after using inhalers to prevent recurrence. 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 No current ulceration or significant symptoms noted. Discussed monitoring for any changes or recurrence of symptoms and potential biopsy if ulceration recurs. - Monitor for any changes or recurrence of symptoms - Consider biopsy if ulceration recurs  Follow-up - Schedule follow-up appointment after voice therapy - Provide handout on elimination diet.      Update 01/07/2024 Assessment and Plan    Chronic dysphonia and VF atrophy/glottic insufficiency on  scope exam during initial evaluation  Undergoing voice therapy with noted improvement, but still has raspy voice at times. Persistent nasal congestion and postnasal drainage contributing to symptoms Discussed benefits of Reflux Gourmet in addition to Dexilant to prevent reflux, especially when lying down. Reflux Diet.  - Continue voice therapy - Use Reflux Gourmet supplement after meals, especially after dinner - continue Dexilant at current dose - Follow up in two months for re-evaluation and repeat scope exam  - will consider injection augmentation when she returns if voice is not better  Chronic nasal congestion and post-nasal drainage hx of asthma Using multiple medications including Dupixent, Dulera, Flonase, Astelin, and ipratropium bromide. Discussed use of Vaseline and saline sprays to alleviate dryness. - Continue current medications (Dupixent, Dulera, Flonase, Astelin, ipratropium bromide) - Use Vaseline and saline sprays - Use humidifier at home and work  Eosinophilic Esophagitis Attempting elimination diet, reducing caffeine intake, and increasing water consumption. Noted improvement in symptoms with dietary changes. - Continue elimination diet - Continue reducing caffeine intake - Maintain increased water consumption - will consider GI referral for repeat EGD   Follow-up - Schedule follow-up appointment in two months for reassessment and repeat strobe exam   I spent 30 minutes in total face-to-face time and in reviewing records during pre-charting, more than 50% of which was spent in counseling and coordination of care, reviewing test results, reviewing medications and treatment regimen and/or in discussing or reviewing the diagnosis, the prognosis and treatment options. Pertinent laboratory and imaging test results that were available during this visit with the patient were reviewed by me and considered in my medical decision making (see chart for details).    Ashok Croon, MD Otolaryngology [facility] Phone: [phone] Fax: [phone]    01/07/2024, 9:32 AM

## 2024-01-07 NOTE — Therapy (Signed)
 OUTPATIENT SPEECH LANGUAGE PATHOLOGY VOICE TREATMENT   Patient Name: Carla Morales MRN: [id number] DOB:1961/07/05, 63 y.o., female Today's Date: 01/07/2024  PCP: Jannifer Rodney, FNP REFERRING PROVIDER: Ashok Croon, MD  END OF SESSION:  End of Session - 01/07/24 1228     Visit Number 7    Number of Visits 17    Date for SLP Re-Evaluation 02/22/24    SLP Start Time 1020    SLP Stop Time  1055    SLP Time Calculation (min) 35 min    Activity Tolerance Patient tolerated treatment well                   Past Medical History:  Diagnosis Date   Adenomatous colon polyp    Allergy    Arthritis    Asthma    Chondrocalcinosis    COVID-19    Diverticulosis    Elevated alkaline phosphatase level    Elevated antinuclear antibody (ANA) level    Eosinophilic esophagitis    Esophageal dysmotility    Esophageal stenosis    Fatty liver    GERD (gastroesophageal reflux disease)    Hyperlipidemia    Internal hemorrhoids    Migraine    Osteoarthritis    Pinched nerve in neck    Schatzki's ring    Sinus congestion    Urinary incontinence    Past Surgical History:  Procedure Laterality Date   ABDOMINAL HYSTERECTOMY     bladder dialation  1968   as a child   COLONOSCOPY     TONSILLECTOMY  1971   TOTAL VAGINAL HYSTERECTOMY  07/2010   menorrhagia, leiomyoma, prolapse   TUBAL LIGATION  1998   Patient Active Problem List   Diagnosis Date Noted   Bilateral pes planus 10/16/2023   Mouth ulcer 10/10/2023   Eosinophilic esophagitis 10/10/2023   Osteoarthritis of left knee 09/18/2023   Pruritus 03/28/2023   Allergic conjunctivitis of both eyes 03/28/2023   Peripheral edema 07/11/2021   Asthma 01/07/2021   GERD (gastroesophageal reflux disease) 10/29/2019   Vitamin D deficiency 10/29/2019   Family history of breast cancer    Family history of uterine cancer    Family history of lung cancer    Family history of melanoma    Obesity (BMI 30-39.9) 03/29/2018    Perennial allergic rhinitis    Hyperlipidemia     Onset date: 2020, worse 18 months ago  REFERRING DIAG:  R49.0 (ICD-10-CM) - Dysphonia  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 No  FALLS: Has patient fallen in last 6 months? Yes, Number of falls: 1  PATIENT GOALS: Hoarseness going away  OBJECTIVE:  Note: Objective measures were completed at Evaluation unless otherwise noted.  DIAGNOSTIC FINDINGS:  ENT - [place] 11/12/23 Procedure: The patient was seated upright in the exam chair.   Topical lidocaine and Afrin were applied to the nasal cavity. After adequate anesthesia had occurred, the flexible telescope was passed into the nasal cavity. The nasopharynx was patent without mass or lesion. The scope was passed behind the soft palate and directed toward the base of tongue. The base of tongue was visualized and was symmetric with no apparent masses or abnormal appearing  tissue. There were no signs of a mass or pooling of secretions in the piriform sinuses. The supraglottic structures were normal.   The true vocal cords are mobile. The medial edges were bowed. Closure was incomplete with spindle-shaped glottic gap and supraglottic compression. Periodicity present. The mucosal wave and amplitude were symmetric and present. There is moderate interarytenoid pachydermia and post cricoid edema. The mucosa appears without lesions.   The laryngoscope was then slowly withdrawn and the patient tolerated the procedure well. There were no complications or blood loss.Assessment and Plan    Chronic dysphonia and Voice Fatigue Recurrent  hoarseness and throat pain, exacerbated by talking and singing. Vocal folds appear thin with incomplete closure and mild edema on videostrobe today. No evidence of tumor or mass/no lesions mucosal wave intact. She had significant supraglottic compression and I believe there is an element of muscle tension which might cause throat discomfort/voice fatigue over time.  Discussed voice therapy as a first-line treatment to optimize breath support and reduce symptoms. Potential future interventions include vocal fold filler injection augmentation vs trial of Botox if voice therapy is ineffective and repeat exam will be c/w laryngeal spasm.  - Refer to voice therapy - Optimize reflux control - Continue current management of postnasal drainage - Consider elimination diet for EoE  - trial of Reflux Gourmet   Gastroesophageal Reflux Disease (GERD) GERD managed with Dexilant. Symptoms include throat pain and possible contribution to voice changes. Discussed optimizing reflux control through dietary adjustments and natural supplements. - Continue Dexilant at current dose - Optimize reflux control through dietary adjustments and trial of reflux gourmet   Eosinophilic Esophagitis (EOE) EOE managed with Dupixent and Dexilant. No significant improvement noted on recent endoscopy per report. No formal elimination diet in the past. Discussed the six-food elimination diet to identify specific triggers and improve symptoms. - Provide information on six-food elimination diet - Encourage dietary adjustments to identify specific triggers   Asthma and Environmental Allergies Post-nasal drainage Nasal endoscopy today with S-shaped septum and septal deviation but no evidence of polyps or no pus to suggest sinusitis  Asthma with episodes of uncontrolled symptoms requiring increased medication. Currently on Dupixent, Dulera, Flonase/Asteline/Ipratropium Bromide. Discussed the importance of rinsing mouth after using inhalers  to prevent thrush. - Continue current asthma medications (Dupixent, Dulera) - Monitor for symptom control and adjust medications as needed - continue Flonase/Asteline/Ipratropium Bromide daily 2 puffs b/l nares  - continue Xyzal 5 mg daily    Recurrent Thrush - no evidence of thrush on exam today Recurrent thrush associated with steroid use for asthma. No current evidence of thrush on examination. Discussed the importance of rinsing mouth after using inhalers to prevent recurrence. 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 No current ulceration or significant symptoms noted. Discussed monitoring for any changes or recurrence of symptoms and potential biopsy if ulceration recurs. - Monitor for any changes or recurrence of symptoms - Consider biopsy if ulceration recurs  TREATMENT DATE:  Semi-Occluded Vocal Tract Exercises= SOVTE; Abdominal breathing=AB  01/07/24:[facility] saw Dr. Irene Pap this morning, told SLP she could see pt was still dealing with post nasal drip. Today, pt demonstrated relaxed speech approx 60% of the time during conversation with focus on relaxed voice. With PhoRTE, pt req'd mod A usually, faded to usual min A with procedure with numbers section of PhoRTE. She will cont with PhoRTE at home, following SOVTE (straw singing).  12/31/23: Pt practiced once Friday morning, and twice Sunday and once in car coming to ST today. Pt appears to be dealing with persistent phlegm from a URI/cold last week. Clear voice observed 70% of the time with reading sentences.  SLP introduced pt to PhoRTE today and pt req'd mod A occasionally faded to rare min A. She req'd more cues  for wider pitch range with numbers and lower voice for reading sentences task. See "pt instructions" for details.   12/27/23: SOVTE tasks (lip or tongue trill) paired with word level responses 88% WNL voicing. SLP attempted to incr complexity to sentences read without trill however pt success decr'd to 68%. SLP added trill back again and success improved to 80%. SLP then worked towards not using trill prior to production but pt req'd use of trill for success > 80%. Homework for lip/tongue trill with words x10, then practice with lip/tongue trill with either word or phrase response x10 minutes (BID) or x15 minutes (every day).  12/25/23: SLP should check H2O intake next session. Pt ran out of Zyzal and wonders if congestion for last few days has anything to do with this as well. SOVTE completed with initial cues to start lower pitch and go to higher pitch. Mostly-WNL voicing heard after 5 reps of lip trills and 5 reps of tongue trills. Pt stated she had been focusing more on AB and feeling a relaxed voice since last session. SLP worked with pt in sentence tasks and she exhibited WNL voice 70% and mostly-WNL voice 25%. AB was used 90% of the time. Homework to practice AB and "relaxed voice" for at least 15 minutes BID.   12/13/23: Pt has been practicing flow phonation but not SOVTE exercises. SLP targeted SOVTE since pt has not done those. SLP used "zh" ("measure") and pt with almost WNL voicing 90% of the time. The syllable "zha" was also successful as well as "zha zha" however "zha mama" was not successful. SLP went back to zha and then also did tongue and lip trills with WNL voicing. Pt was unable to transition to any real words at this time so homework is for "zh", tongue trill and lip trills with up and down pitch.  12/11/23: Pt had half of a Coke instead of full can of Coke last night. SLP cont to encourage pt to minimize caffeine and incr water (with a decaf drink mix if necessary). Today SLP worked with pt  on AB at rest. Today SLP guided pt through SOVTE with straw in water, out of water all focusing on buzz in mouth and nose. SLP then had pt work without straw with /u/, /mmmmu/. SLP drew pt's attention to a more WNL sounding voice quality without the "edge" to it that she had when she arrived today. Pt stated she felt the difference. Homework: see "pt instructions"  12/03/23 (eval): N/A  PATIENT EDUCATION: Education details: See "clinical impressions" below Person educated: Patient Education method: Explanation, Demonstration, Verbal cues, and Handouts Education comprehension: verbalized understanding, returned demonstration, verbal cues required, and needs  further education  HOME EXERCISE PROGRAM: SOVTE, and other voice tasks at home. PhoRTE.   GOALS: Goals reviewed with patient? Yes  SHORT TERM GOALS: Target date: 01/03/24  Pt will complete exercises to reduce vocal tension (SOVTE and others) as directed at least days/week for two weeks via tracking sheet  Baseline: Goal status: met  2.  Pt will demo improved voice in sentence responses 70% in 3 sessions Baseline:  Goal status: Partially met  3.  Pt will achieve AB in sentence responses 70% in 3 sessions Baseline: 12/25/23, 12/31/23 Goal status: Met  4.  Pt will complete PhoRTE exercises with occasional min mod A Baseline:  Goal status: INITIAL  5.  Pt will incr water intake to at least 48 oz/day at least 5 days in a 7 day period, as monitored by tracking sheet Baseline:  Goal status: Met   LONG TERM GOALS: Target date: 02/22/24  Pt will improve PROM Baseline:  Goal status: INITIAL  2.  Pt will decr s/z ratio to <1.0 Baseline:  Goal status: INITIAL  3.  Pt will complete exercises to improve voice/decr glottal gap as directed at least 6 days/week for two weeks via tracking sheet  Baseline:  Goal status: INITIAL  4.  Pt will incr water intake to at least 60 oz/day at least 5 days in a 7 day period, as monitored by  tracking sheet Baseline:  Goal status: INITIAL  5  Pt will demo improved voice in 10 minutes conversation in 3 sessions Baseline:  Goal status: INITIAL  6.  Pt will achieve AB to at least 70% in 10 minutes conversation in 3 sessions Baseline:  Goal status: INITIAL  ASSESSMENT:  CLINICAL IMPRESSION: Patient is a 63 y.o. 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 See "treatment date" for more details. SLP reviewed PhoRTE with pt today. Pt is highly motivated and based upon her willingness to participate in ST, is a good candidate for skilled ST targeting improved vocal quality.  OBJECTIVE IMPAIRMENTS: include voice disorder. These impairments are limiting patient from return to work, household responsibilities, ADLs/IADLs, and effectively communicating at home and in community. Factors affecting potential to achieve goals and functional outcome are severity of impairments.. Patient will benefit from skilled SLP services to address above impairments and improve overall function.  REHAB POTENTIAL: Good  PLAN:  SLP FREQUENCY: 1-2x/week  SLP DURATION: 10 weeks  PLANNED INTERVENTIONS: Environmental controls, Internal/external aids, Functional tasks, SLP instruction and feedback, Compensatory strategies, Patient/family education, [phone] Treatment of speech (30 or 45 min) , and voice exercises    Javares Kaufhold, CCC-SLP 01/07/2024, 12:29 PM

## 2024-01-07 NOTE — Telephone Encounter (Signed)
 Pharmacy Patient Advocate Encounter  Received request for PA however pt has changed to Dupixent.  Insurance verification completed.   The patient is insured through [facility]   Ran test claim for [facility] 2MG  SUSP. Currently a quantity of is a 30 day supply and the co-pay is $20. The current 30 day co-pay is, $20.  No PA needed at this time.  This test claim was processed through [facility]- copay amounts may vary at other pharmacies due to pharmacy/plan contracts, or as the patient moves through the different stages of their insurance plan.

## 2024-01-09 ENCOUNTER — Ambulatory Visit: Payer: BC Managed Care – PPO

## 2024-01-09 DIAGNOSIS — R490 Dysphonia: Secondary | ICD-10-CM

## 2024-01-09 NOTE — Therapy (Signed)
 OUTPATIENT SPEECH LANGUAGE PATHOLOGY VOICE TREATMENT   Patient Name: Carla Morales MRN: [id number] DOB:30-Sep-1961, 63 y.o., female Today's Date: 01/09/2024  PCP: Jannifer Rodney, FNP REFERRING PROVIDER: Ashok Croon, MD  END OF SESSION:  End of Session - 01/09/24 1625     Visit Number 8    Number of Visits 17    Date for SLP Re-Evaluation 02/22/24    SLP Start Time 1620    SLP Stop Time  1700    SLP Time Calculation (min) 40 min    Activity Tolerance Patient tolerated treatment well                   Past Medical History:  Diagnosis Date   Adenomatous colon polyp    Allergy    Arthritis    Asthma    Chondrocalcinosis    COVID-19    Diverticulosis    Elevated alkaline phosphatase level    Elevated antinuclear antibody (ANA) level    Eosinophilic esophagitis    Esophageal dysmotility    Esophageal stenosis    Fatty liver    GERD (gastroesophageal reflux disease)    Hyperlipidemia    Internal hemorrhoids    Migraine    Osteoarthritis    Pinched nerve in neck    Schatzki's ring    Sinus congestion    Urinary incontinence    Past Surgical History:  Procedure Laterality Date   ABDOMINAL HYSTERECTOMY     bladder dialation  1968   as a child   COLONOSCOPY     TONSILLECTOMY  1971   TOTAL VAGINAL HYSTERECTOMY  07/2010   menorrhagia, leiomyoma, prolapse   TUBAL LIGATION  1998   Patient Active Problem List   Diagnosis Date Noted   Bilateral pes planus 10/16/2023   Mouth ulcer 10/10/2023   Eosinophilic esophagitis 10/10/2023   Osteoarthritis of left knee 09/18/2023   Pruritus 03/28/2023   Allergic conjunctivitis of both eyes 03/28/2023   Peripheral edema 07/11/2021   Asthma 01/07/2021   GERD (gastroesophageal reflux disease) 10/29/2019   Vitamin D deficiency 10/29/2019   Family history of breast cancer    Family history of uterine cancer    Family history of lung cancer    Family history of melanoma    Obesity (BMI 30-39.9) 03/29/2018    Perennial allergic rhinitis    Hyperlipidemia     Onset date: 2020, worse 18 months ago  REFERRING DIAG:  R49.0 (ICD-10-CM) - Dysphonia  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 No  FALLS: Has patient fallen in last 6 months? Yes, Number of falls: 1  PATIENT GOALS: Hoarseness going away  OBJECTIVE:  Note: Objective measures were completed at Evaluation unless otherwise noted.  DIAGNOSTIC FINDINGS:  ENT - [facility] 11/12/23 Procedure: The patient was seated upright in the exam chair.   Topical lidocaine and Afrin were applied to the nasal cavity. After adequate anesthesia had occurred, the flexible telescope was passed into the nasal cavity. The nasopharynx was patent without mass or lesion. The scope was passed behind the soft palate and directed toward the base of tongue. The base  of tongue was visualized and was symmetric with no apparent masses or abnormal appearing tissue. There were no signs of a mass or pooling of secretions in the piriform sinuses. The supraglottic structures were normal.   The true vocal cords are mobile. The medial edges were bowed. Closure was incomplete with spindle-shaped glottic gap and supraglottic compression. Periodicity present. The mucosal wave and amplitude were symmetric and present. There is moderate interarytenoid pachydermia and post cricoid edema. The mucosa appears without lesions.   The laryngoscope was then slowly withdrawn and the patient tolerated the procedure well. There were no complications or blood loss.Assessment  and Plan    Chronic dysphonia and Voice Fatigue Recurrent hoarseness and throat pain, exacerbated by talking and singing. Vocal folds appear thin with incomplete closure and mild edema on videostrobe today. No evidence of tumor or mass/no lesions mucosal wave intact. She had significant supraglottic compression and I believe there is an element of muscle tension which might cause throat discomfort/voice fatigue over time.  Discussed voice therapy as a first-line treatment to optimize breath support and reduce symptoms. Potential future interventions include vocal fold filler injection augmentation vs trial of Botox if voice therapy is ineffective and repeat exam will be c/w laryngeal spasm.  - Refer to voice therapy - Optimize reflux control - Continue current management of postnasal drainage - Consider elimination diet for EoE  - trial of Reflux Gourmet   Gastroesophageal Reflux Disease (GERD) GERD managed with Dexilant. Symptoms include throat pain and possible contribution to voice changes. Discussed optimizing reflux control through dietary adjustments and natural supplements. - Continue Dexilant at current dose - Optimize reflux control through dietary adjustments and trial of reflux gourmet   Eosinophilic Esophagitis (EOE) EOE managed with Dupixent and Dexilant. No significant improvement noted on recent endoscopy per report. No formal elimination diet in the past. Discussed the six-food elimination diet to identify specific triggers and improve symptoms. - Provide information on six-food elimination diet - Encourage dietary adjustments to identify specific triggers   Asthma and Environmental Allergies Post-nasal drainage Nasal endoscopy today with S-shaped septum and septal deviation but no evidence of polyps or no pus to suggest sinusitis  Asthma with episodes of uncontrolled symptoms requiring increased medication. Currently on Dupixent, Dulera, Flonase/Asteline/Ipratropium Bromide.  Discussed the importance of rinsing mouth after using inhalers to prevent thrush. - Continue current asthma medications (Dupixent, Dulera) - Monitor for symptom control and adjust medications as needed - continue Flonase/Asteline/Ipratropium Bromide daily 2 puffs b/l nares  - continue Xyzal 5 mg daily    Recurrent Thrush - no evidence of thrush on exam today Recurrent thrush associated with steroid use for asthma. No current evidence of thrush on examination. Discussed the importance of rinsing mouth after using inhalers to prevent recurrence. 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 No current ulceration or significant symptoms noted. Discussed monitoring for any changes or recurrence of symptoms and potential biopsy if ulceration recurs. - Monitor for any changes or recurrence of symptoms - Consider biopsy if ulceration recurs  TREATMENT DATE:  Semi-Occluded Vocal Tract Exercises= SOVTE; Abdominal breathing=AB  01/09/24: Water average remains >60 oz, began last week. Pt arrived today with relaxed voice (almost WNL quality) 70-75% for initial conversation with SLP for 5 minutes. By session end, SLP heard almost WNL voice quality approx 50% of session. Pt performed PhoRTE with /a/ rare min A for relaxed voice (on rep #7) and not "vibrating voice." Pt able to correct on next production and maintain until rep #10. With numbers, SLP encouraged pt to "get higher than you tihnk you can" and her range improved dramatically. She was able to maintain this production range for all remaining reps. She performed high and low sentences with initial cue only. SLP told pt she may want to  consider keeping a straw in her office and use it for straw phonation 3-4-5 times a day in order to habituate relaxed voice. If pt has relaxed voice 75% of next session overall and does not require SLP cues for PhoRTE, reduction to once/week will be strongly considered.   01/07/24:Dierra saw Dr. Irene Pap this morning, told SLP she could see pt was still dealing with post nasal drip. Today, pt demonstrated relaxed speech approx 60% of the time during conversation with focus on relaxed voice. With PhoRTE, pt req'd mod A usually, faded to usual min A with procedure with numbers section of PhoRTE. She will cont with PhoRTE at home, following SOVTE (straw singing).  12/31/23: Pt practiced once Friday morning, and twice Sunday and once in car coming to ST today. Pt appears to be dealing with persistent phlegm from a URI/cold last week. Clear voice observed 70% of the time with reading sentences.  SLP introduced pt to PhoRTE today and pt req'd mod A occasionally faded to rare min A. She req'd more cues for wider pitch range with numbers and lower voice for reading sentences task. See "pt instructions" for details.   12/27/23: SOVTE tasks (lip or tongue trill) paired with word level responses 88% WNL voicing. SLP attempted to incr complexity to sentences read without trill however pt success decr'd to 68%. SLP added trill back again and success improved to 80%. SLP then worked towards not using trill prior to production but pt req'd use of trill for success > 80%. Homework for lip/tongue trill with words x10, then practice with lip/tongue trill with either word or phrase response x10 minutes (BID) or x15 minutes (every day).  12/25/23: SLP should check H2O intake next session. Pt ran out of Zyzal and wonders if congestion for last few days has anything to do with this as well. SOVTE completed with initial cues to start lower pitch and go to higher pitch. Mostly-WNL voicing heard after 5 reps of lip trills and 5 reps of  tongue trills. Pt stated she had been focusing more on AB and feeling a relaxed voice since last session. SLP worked with pt in sentence tasks and she exhibited WNL voice 70% and mostly-WNL voice 25%. AB was used 90% of the time. Homework to practice AB and "relaxed voice" for at least 15 minutes BID.   12/13/23: Pt has been practicing flow phonation but not SOVTE exercises. SLP targeted SOVTE since pt has not done those. SLP used "zh" ("measure") and pt with almost WNL voicing 90% of the time. The syllable "zha" was also successful as well as "zha zha" however "zha mama" was not successful. SLP went back to zha and then also did tongue and lip trills with WNL voicing. Pt was  unable to transition to any real words at this time so homework is for "zh", tongue trill and lip trills with up and down pitch.  12/11/23: Pt had half of a Coke instead of full can of Coke last night. SLP cont to encourage pt to minimize caffeine and incr water (with a decaf drink mix if necessary). Today SLP worked with pt on AB at rest. Today SLP guided pt through SOVTE with straw in water, out of water all focusing on buzz in mouth and nose. SLP then had pt work without straw with /u/, /mmmmu/. SLP drew pt's attention to a more WNL sounding voice quality without the "edge" to it that she had when she arrived today. Pt stated she felt the difference. Homework: see "pt instructions"  12/03/23 (eval): N/A  PATIENT EDUCATION: Education details: See "clinical impressions" below Person educated: Patient Education method: Explanation, Demonstration, Verbal cues, and Handouts Education comprehension: verbalized understanding, returned demonstration, verbal cues required, and needs further education  HOME EXERCISE PROGRAM: SOVTE, and other voice tasks at home. PhoRTE.   GOALS: Goals reviewed with patient? Yes  SHORT TERM GOALS: Target date: 01/03/24  Pt will complete exercises to reduce vocal tension (SOVTE and others) as directed  at least days/week for two weeks via tracking sheet  Baseline: Goal status: met  2.  Pt will demo improved voice in sentence responses 70% in 3 sessions Baseline:  Goal status: Partially met  3.  Pt will achieve AB in sentence responses 70% in 3 sessions Baseline: 12/25/23, 12/31/23 Goal status: Met  4.  Pt will complete PhoRTE exercises with occasional min mod A Baseline:  Goal status: not met  5.  Pt will incr water intake to at least 48 oz/day at least 5 days in a 7 day period, as monitored by tracking sheet Baseline:  Goal status: Met   LONG TERM GOALS: Target date: 02/22/24  Pt will improve PROM Baseline:  Goal status: INITIAL  2.  Pt will decr s/z ratio to <1.0 Baseline:  Goal status: INITIAL  3.  Pt will complete exercises to improve voice/decr glottal gap as directed at least 6 days/week for two weeks via tracking sheet  Baseline:  Goal status: INITIAL  4.  Pt will incr water intake to at least 60 oz/day at least 5 days in a 7 day period, as monitored by tracking sheet Baseline:  Goal status: met  5  Pt will demo improved voice in 10 minutes conversation in 3 sessions Baseline:  Goal status: INITIAL  6.  Pt will achieve AB to at least 70% in 10 minutes conversation in 3 sessions Baseline:  Goal status: INITIAL  ASSESSMENT:  CLINICAL IMPRESSION: Patient is a 63 y.o. 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 See "treatment date" for more details. SLP reviewed PhoRTE with pt today. Pt is highly motivated and based upon her willingness to participate in ST, she remains a good candidate for skilled ST targeting improved vocal quality.  OBJECTIVE IMPAIRMENTS: include voice disorder. These impairments are limiting patient from return to work, household responsibilities, ADLs/IADLs, and effectively communicating at home and  in community. Factors affecting potential to achieve goals and functional outcome are severity of impairments.. Patient will benefit from skilled SLP services to address above impairments and improve overall function.  REHAB POTENTIAL: Good  PLAN:  SLP FREQUENCY: 1-2x/week  SLP DURATION: 10 weeks  PLANNED  INTERVENTIONS: Environmental controls, Internal/external aids, Functional tasks, SLP instruction and feedback, Compensatory strategies, Patient/family education, [phone] Treatment of speech (30 or 45 min) , and voice exercises    Misako Roeder, CCC-SLP 01/09/2024, 4:31 PM

## 2024-01-15 ENCOUNTER — Ambulatory Visit: Payer: BC Managed Care – PPO | Attending: Family

## 2024-01-15 DIAGNOSIS — R490 Dysphonia: Secondary | ICD-10-CM | POA: Diagnosis not present

## 2024-01-15 NOTE — Therapy (Signed)
 OUTPATIENT SPEECH LANGUAGE PATHOLOGY VOICE TREATMENT   Patient Name: Carla Morales MRN: [id number] DOB:[birth year], 63 y.o., female Today's Date: 01/15/2024  PCP: Jannifer Rodney, FNP REFERRING PROVIDER: Ashok Croon, MD  END OF SESSION:  End of Session - 01/15/24 1747     Visit Number 9    Number of Visits 17    Date for SLP Re-Evaluation 02/22/24    SLP Start Time 1618    SLP Stop Time  1656    SLP Time Calculation (min) 38 min    Activity Tolerance Patient tolerated treatment well                    Past Medical History:  Diagnosis Date   Adenomatous colon polyp    Allergy    Arthritis    Asthma    Chondrocalcinosis    COVID-19    Diverticulosis    Elevated alkaline phosphatase level    Elevated antinuclear antibody (ANA) level    Eosinophilic esophagitis    Esophageal dysmotility    Esophageal stenosis    Fatty liver    GERD (gastroesophageal reflux disease)    Hyperlipidemia    Internal hemorrhoids    Migraine    Osteoarthritis    Pinched nerve in neck    Schatzki's ring    Sinus congestion    Urinary incontinence    Past Surgical History:  Procedure Laterality Date   ABDOMINAL HYSTERECTOMY     bladder dialation  1968   as a child   COLONOSCOPY     TONSILLECTOMY  1971   TOTAL VAGINAL HYSTERECTOMY  07/2010   menorrhagia, leiomyoma, prolapse   TUBAL LIGATION  1998   Patient Active Problem List   Diagnosis Date Noted   Bilateral pes planus 10/16/2023   Mouth ulcer 10/10/2023   Eosinophilic esophagitis 10/10/2023   Osteoarthritis of left knee 09/18/2023   Pruritus 03/28/2023   Allergic conjunctivitis of both eyes 03/28/2023   Peripheral edema 07/11/2021   Asthma 01/07/2021   GERD (gastroesophageal reflux disease) 10/29/2019   Vitamin D deficiency 10/29/2019   Family history of breast cancer    Family history of uterine cancer    Family history of lung cancer    Family history of melanoma    Obesity (BMI 30-39.9) 03/29/2018    Perennial allergic rhinitis    Hyperlipidemia     Onset date: 2020, worse 18 months ago  REFERRING DIAG:  R49.0 (ICD-10-CM) - Dysphonia  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 No  FALLS: Has patient fallen in last 6 months? Yes, Number of falls: 1  PATIENT GOALS: Hoarseness going away  OBJECTIVE:  Note: Objective measures were completed at Evaluation unless otherwise noted.  DIAGNOSTIC FINDINGS:  ENT - [facility] 11/12/23 Procedure: The patient was seated upright in the exam chair.   Topical lidocaine and Afrin were applied to the nasal cavity. After adequate anesthesia had occurred, the flexible telescope was passed into the nasal cavity. The nasopharynx was patent without mass or lesion. The scope was passed behind the soft palate and directed toward the base of tongue. The base of tongue was visualized  and was symmetric with no apparent masses or abnormal appearing tissue. There were no signs of a mass or pooling of secretions in the piriform sinuses. The supraglottic structures were normal.   The true vocal cords are mobile. The medial edges were bowed. Closure was incomplete with spindle-shaped glottic gap and supraglottic compression. Periodicity present. The mucosal wave and amplitude were symmetric and present. There is moderate interarytenoid pachydermia and post cricoid edema. The mucosa appears without lesions.   The laryngoscope was then slowly withdrawn and the patient tolerated the procedure well. There were no complications or blood loss.Assessment and Plan    Chronic  dysphonia and Voice Fatigue Recurrent hoarseness and throat pain, exacerbated by talking and singing. Vocal folds appear thin with incomplete closure and mild edema on videostrobe today. No evidence of tumor or mass/no lesions mucosal wave intact. She had significant supraglottic compression and I believe there is an element of muscle tension which might cause throat discomfort/voice fatigue over time.  Discussed voice therapy as a first-line treatment to optimize breath support and reduce symptoms. Potential future interventions include vocal fold filler injection augmentation vs trial of Botox if voice therapy is ineffective and repeat exam will be c/w laryngeal spasm.  - Refer to voice therapy - Optimize reflux control - Continue current management of postnasal drainage - Consider elimination diet for EoE  - trial of Reflux Gourmet   Gastroesophageal Reflux Disease (GERD) GERD managed with Dexilant. Symptoms include throat pain and possible contribution to voice changes. Discussed optimizing reflux control through dietary adjustments and natural supplements. - Continue Dexilant at current dose - Optimize reflux control through dietary adjustments and trial of reflux gourmet   Eosinophilic Esophagitis (EOE) EOE managed with Dupixent and Dexilant. No significant improvement noted on recent endoscopy per report. No formal elimination diet in the past. Discussed the six-food elimination diet to identify specific triggers and improve symptoms. - Provide information on six-food elimination diet - Encourage dietary adjustments to identify specific triggers   Asthma and Environmental Allergies Post-nasal drainage Nasal endoscopy today with S-shaped septum and septal deviation but no evidence of polyps or no pus to suggest sinusitis  Asthma with episodes of uncontrolled symptoms requiring increased medication. Currently on Dupixent, Dulera, Flonase/Asteline/Ipratropium Bromide. Discussed the importance  of rinsing mouth after using inhalers to prevent thrush. - Continue current asthma medications (Dupixent, Dulera) - Monitor for symptom control and adjust medications as needed - continue Flonase/Asteline/Ipratropium Bromide daily 2 puffs b/l nares  - continue Xyzal 5 mg daily    Recurrent Thrush - no evidence of thrush on exam today Recurrent thrush associated with steroid use for asthma. No current evidence of thrush on examination. Discussed the importance of rinsing mouth after using inhalers to prevent recurrence. 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 No current ulceration or significant symptoms noted. Discussed monitoring for any changes or recurrence of symptoms and potential biopsy if ulceration recurs. - Monitor for any changes or recurrence of symptoms - Consider biopsy if ulceration recurs  TREATMENT DATE:  Semi-Occluded Vocal Tract Exercises= SOVTE; Abdominal breathing=AB  01/15/24: SLP engaged pt in 10 minutes conversation with 90% AB. Relaxed voice (less "edge" to pt's voice) heard 70% of the time. Pt is still drinking >60 oz water and has forgone caffeinated drinks after breakfast/morning. PhoRTE exercises completed today with initial cue for /a/ to make an /a/ and not "aw" vowel. Pt independent after this. SLP and pt agreed she could be seen once every week.  01/09/24: Water average remains >60 oz, began last week. Pt arrived today with relaxed voice (almost WNL quality) 70-75% for initial conversation with SLP for 5 minutes. By session end, SLP heard almost WNL voice quality approx 50% of session. Pt performed PhoRTE with /a/ rare min A for relaxed voice (on  rep #7) and not "vibrating voice." Pt able to correct on next production and maintain until rep #10. With numbers, SLP encouraged pt to "get higher than you tihnk you can" and her range improved dramatically. She was able to maintain this production range for all remaining reps. She performed high and low sentences with initial cue only. SLP told pt she may want to consider keeping a straw in her office and use it for straw phonation 3-4-5 times a day in order to habituate relaxed voice. If pt has relaxed voice 75% of next session overall and does not require SLP cues for PhoRTE, reduction to once/week will be strongly considered.   01/07/24:[place] saw Dr. Irene Pap this morning, told SLP she could see pt was still dealing with post nasal drip. Today, pt demonstrated relaxed speech approx 60% of the time during conversation with focus on relaxed voice. With PhoRTE, pt req'd mod A usually, faded to usual min A with procedure with numbers section of PhoRTE. She will cont with PhoRTE at home, following SOVTE (straw singing).  12/31/23: Pt practiced once Friday morning, and twice Sunday and once in car coming to ST today. Pt appears to be dealing with persistent phlegm from a URI/cold last week. Clear voice observed 70% of the time with reading sentences.  SLP introduced pt to PhoRTE today and pt req'd mod A occasionally faded to rare min A. She req'd more cues for wider pitch range with numbers and lower voice for reading sentences task. See "pt instructions" for details.   12/27/23: SOVTE tasks (lip or tongue trill) paired with word level responses 88% WNL voicing. SLP attempted to incr complexity to sentences read without trill however pt success decr'd to 68%. SLP added trill back again and success improved to 80%. SLP then worked towards not using trill prior to production but pt req'd use of trill for success > 80%. Homework for lip/tongue trill with words x10, then practice with lip/tongue trill with either  word or phrase response x10 minutes (BID) or x15 minutes (every day).  12/25/23: SLP should check H2O intake next session. Pt ran out of Zyzal and wonders if congestion for last few days has anything to do with this as well. SOVTE completed with initial cues to start lower pitch and go to higher pitch. Mostly-WNL voicing heard after 5 reps of lip trills and 5 reps of tongue trills. Pt stated she had been focusing more on AB and feeling a relaxed voice since last session. SLP worked with pt in sentence tasks and she exhibited WNL voice 70% and mostly-WNL voice 25%. AB was used 90% of the time. Homework to practice AB and "relaxed voice" for at least 15 minutes BID.  12/13/23: Pt has been practicing flow phonation but not SOVTE exercises. SLP targeted SOVTE since pt has not done those. SLP used "zh" ("measure") and pt with almost WNL voicing 90% of the time. The syllable "zha" was also successful as well as "zha zha" however "zha mama" was not successful. SLP went back to zha and then also did tongue and lip trills with WNL voicing. Pt was unable to transition to any real words at this time so homework is for "zh", tongue trill and lip trills with up and down pitch.  12/11/23: Pt had half of a Coke instead of full can of Coke last night. SLP cont to encourage pt to minimize caffeine and incr water (with a decaf drink mix if necessary). Today SLP worked with pt on AB at rest. Today SLP guided pt through SOVTE with straw in water, out of water all focusing on buzz in mouth and nose. SLP then had pt work without straw with /u/, /mmmmu/. SLP drew pt's attention to a more WNL sounding voice quality without the "edge" to it that she had when she arrived today. Pt stated she felt the difference. Homework: see "pt instructions"  12/03/23 (eval): N/A  PATIENT EDUCATION: Education details: See "clinical impressions" below Person educated: Patient Education method: Explanation, Demonstration, Verbal cues, and  Handouts Education comprehension: verbalized understanding, returned demonstration, verbal cues required, and needs further education  HOME EXERCISE PROGRAM: SOVTE, and other voice tasks at home. PhoRTE.   GOALS: Goals reviewed with patient? Yes  SHORT TERM GOALS: Target date: 01/03/24  Pt will complete exercises to reduce vocal tension (SOVTE and others) as directed at least days/week for two weeks via tracking sheet  Baseline: Goal status: met  2.  Pt will demo improved voice in sentence responses 70% in 3 sessions Baseline:  Goal status: Partially met  3.  Pt will achieve AB in sentence responses 70% in 3 sessions Baseline: 12/25/23, 12/31/23 Goal status: Met  4.  Pt will complete PhoRTE exercises with occasional min mod A Baseline:  Goal status: not met  5.  Pt will incr water intake to at least 48 oz/day at least 5 days in a 7 day period, as monitored by tracking sheet Baseline:  Goal status: Met   LONG TERM GOALS: Target date: 02/22/24  Pt will improve PROM Baseline:  Goal status: INITIAL  2.  Pt will decr s/z ratio to <1.0 Baseline:  Goal status: INITIAL  3.  Pt will complete exercises to improve voice/decr glottal gap as directed at least 6 days/week for two weeks via tracking sheet  Baseline:  Goal status: met  4.  Pt will incr water intake to at least 60 oz/day at least 5 days in a 7 day period, as monitored by tracking sheet Baseline:  Goal status: met  5  Pt will demo improved voice in 10 minutes conversation in 3 sessions Baseline: 01/15/24 Goal status: INITIAL  6.  Pt will achieve AB to at least 70% in 10 minutes conversation in 3 sessions Baseline: 01/15/24 Goal status: INITIAL  ASSESSMENT:  CLINICAL IMPRESSION: Patient is a 63 y.o. 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 See "treatment date" for more  details. SLP reviewed PhoRTE with pt today. Pt is highly motivated and based upon her willingness to participate in ST, she remains a good candidate for skilled ST targeting improved vocal quality. Frequency was  reduced to once/week due to progress today.  OBJECTIVE IMPAIRMENTS: include voice disorder. These impairments are limiting patient from return to work, household responsibilities, ADLs/IADLs, and effectively communicating at home and in community. Factors affecting potential to achieve goals and functional outcome are severity of impairments.. Patient will benefit from skilled SLP services to address above impairments and improve overall function.  REHAB POTENTIAL: Good  PLAN:  SLP FREQUENCY: 1-2x/week  SLP DURATION: 10 weeks  PLANNED INTERVENTIONS: Environmental controls, Internal/external aids, Functional tasks, SLP instruction and feedback, Compensatory strategies, Patient/family education, [phone] Treatment of speech (30 or 45 min) , and voice exercises    Quierra Silverio, CCC-SLP 01/15/2024, 5:48 PM

## 2024-01-17 ENCOUNTER — Ambulatory Visit: Payer: BC Managed Care – PPO

## 2024-01-22 ENCOUNTER — Ambulatory Visit: Payer: BC Managed Care – PPO

## 2024-01-22 DIAGNOSIS — R490 Dysphonia: Secondary | ICD-10-CM

## 2024-01-22 NOTE — Therapy (Signed)
 OUTPATIENT SPEECH LANGUAGE PATHOLOGY VOICE TREATMENT   Patient Name: Carla Morales MRN: [id number] DOB:12/16/1960, 63 y.o., female Today's Date: 01/22/2024  PCP: Jannifer Rodney, FNP REFERRING PROVIDER: Ashok Croon, MD  END OF SESSION:  End of Session - 01/22/24 1618     Visit Number 10    Number of Visits 17    Date for SLP Re-Evaluation 02/22/24    SLP Start Time 1617    SLP Stop Time  1700    SLP Time Calculation (min) 43 min    Activity Tolerance Patient tolerated treatment well                     Past Medical History:  Diagnosis Date   Adenomatous colon polyp    Allergy    Arthritis    Asthma    Chondrocalcinosis    COVID-19    Diverticulosis    Elevated alkaline phosphatase level    Elevated antinuclear antibody (ANA) level    Eosinophilic esophagitis    Esophageal dysmotility    Esophageal stenosis    Fatty liver    GERD (gastroesophageal reflux disease)    Hyperlipidemia    Internal hemorrhoids    Migraine    Osteoarthritis    Pinched nerve in neck    Schatzki's ring    Sinus congestion    Urinary incontinence    Past Surgical History:  Procedure Laterality Date   ABDOMINAL HYSTERECTOMY     bladder dialation  1968   as a child   COLONOSCOPY     TONSILLECTOMY  1971   TOTAL VAGINAL HYSTERECTOMY  07/2010   menorrhagia, leiomyoma, prolapse   TUBAL LIGATION  1998   Patient Active Problem List   Diagnosis Date Noted   Bilateral pes planus 10/16/2023   Mouth ulcer 10/10/2023   Eosinophilic esophagitis 10/10/2023   Osteoarthritis of left knee 09/18/2023   Pruritus 03/28/2023   Allergic conjunctivitis of both eyes 03/28/2023   Peripheral edema 07/11/2021   Asthma 01/07/2021   GERD (gastroesophageal reflux disease) 10/29/2019   Vitamin D deficiency 10/29/2019   Family history of breast cancer    Family history of uterine cancer    Family history of lung cancer    Family history of melanoma    Obesity (BMI 30-39.9)  03/29/2018   Perennial allergic rhinitis    Hyperlipidemia     Onset date: 2020, worse 18 months ago  REFERRING DIAG:  R49.0 (ICD-10-CM) - Dysphonia  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 May get back in to see ENT if possible due to this.   Pt accompanied by: self  PERTINENT HISTORY: GERD, Eosinophilic esophagitis, peripheral edema, asthma, mouth ulcer  PAIN:  Are you having pain? No  FALLS: Has patient fallen in last 6 months? Yes, Number of falls: 1  PATIENT GOALS: Hoarseness going away  OBJECTIVE:  Note: Objective measures were completed at Evaluation unless otherwise noted.  DIAGNOSTIC FINDINGS:  ENT - [facility] 11/12/23 Procedure: The patient was seated upright in the exam chair.   Topical lidocaine and Afrin were applied to the nasal cavity. After adequate anesthesia had occurred, the flexible telescope was passed into the nasal cavity. The nasopharynx was patent without mass or lesion. The scope  was passed behind the soft palate and directed toward the base of tongue. The base of tongue was visualized and was symmetric with no apparent masses or abnormal appearing tissue. There were no signs of a mass or pooling of secretions in the piriform sinuses. The supraglottic structures were normal.   The true vocal cords are mobile. The medial edges were bowed. Closure was incomplete with spindle-shaped glottic gap and supraglottic compression. Periodicity present. The mucosal wave and amplitude were symmetric and present. There is moderate interarytenoid pachydermia and post cricoid edema. The mucosa appears without lesions.   The laryngoscope was then slowly withdrawn and the patient tolerated the procedure  well. There were no complications or blood loss.Assessment and Plan    Chronic dysphonia and Voice Fatigue Recurrent hoarseness and throat pain, exacerbated by talking and singing. Vocal folds appear thin with incomplete closure and mild edema on videostrobe today. No evidence of tumor or mass/no lesions mucosal wave intact. She had significant supraglottic compression and I believe there is an element of muscle tension which might cause throat discomfort/voice fatigue over time.  Discussed voice therapy as a first-line treatment to optimize breath support and reduce symptoms. Potential future interventions include vocal fold filler injection augmentation vs trial of Botox if voice therapy is ineffective and repeat exam will be c/w laryngeal spasm.  - Refer to voice therapy - Optimize reflux control - Continue current management of postnasal drainage - Consider elimination diet for EoE  - trial of Reflux Gourmet   Gastroesophageal Reflux Disease (GERD) GERD managed with Dexilant. Symptoms include throat pain and possible contribution to voice changes. Discussed optimizing reflux control through dietary adjustments and natural supplements. - Continue Dexilant at current dose - Optimize reflux control through dietary adjustments and trial of reflux gourmet   Eosinophilic Esophagitis (EOE) EOE managed with Dupixent and Dexilant. No significant improvement noted on recent endoscopy per report. No formal elimination diet in the past. Discussed the six-food elimination diet to identify specific triggers and improve symptoms. - Provide information on six-food elimination diet - Encourage dietary adjustments to identify specific triggers   Asthma and Environmental Allergies Post-nasal drainage Nasal endoscopy today with S-shaped septum and septal deviation but no evidence of polyps or no pus to suggest sinusitis  Asthma with episodes of uncontrolled symptoms requiring increased medication. Currently on  Dupixent, Dulera, Flonase/Asteline/Ipratropium Bromide. Discussed the importance of rinsing mouth after using inhalers to prevent thrush. - Continue current asthma medications (Dupixent, Dulera) - Monitor for symptom control and adjust medications as needed - continue Flonase/Asteline/Ipratropium Bromide daily 2 puffs b/l nares  - continue Xyzal 5 mg daily    Recurrent Thrush - no evidence of thrush on exam today Recurrent thrush associated with steroid use for asthma. No current evidence of thrush on examination. Discussed the importance of rinsing mouth after using inhalers to prevent recurrence. 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 No current ulceration or significant symptoms noted. Discussed monitoring for any changes or recurrence of symptoms and potential biopsy if ulceration recurs. - Monitor for any changes or recurrence of symptoms - Consider biopsy if ulceration recurs  TREATMENT DATE:  Semi-Occluded Vocal Tract Exercises= SOVTE; Abdominal breathing=AB, Phonatory Resistance Training Exercises=PhoRTE  01/22/24: Pt's use of relaxed voice upon entering ST room was 50-60%. Pt maintained AB 100% of the time in conversation of 15 minutes. SLP encouraged pt to perform pursed lip singing to refocus "relaxed voice" when she experienced more hoarseness after speaking for 20 minutes. After pt did this her voice was less hoarse and maintained for 10 minutes. Cont to drink >60 oz H2O daily and completing PhoRTE on her own. Pt will be seen every other week at this time.   01/15/24: SLP engaged pt in 10 minutes conversation with 90% AB. Relaxed voice (less "edge" to pt's voice)  heard 70% of the time. Pt is still drinking >60 oz water and has forgone caffeinated drinks after breakfast/morning. PhoRTE exercises completed today with initial cue for /a/ to make an /a/ and not "aw" vowel. Pt independent after this. SLP and pt agreed she could be seen once every week.  01/09/24: Water average remains >60 oz, began last week. Pt arrived today with relaxed voice (almost WNL quality) 70-75% for initial conversation with SLP for 5 minutes. By session end, SLP heard almost WNL voice quality approx 50% of session. Pt performed PhoRTE with /a/ rare min A for relaxed voice (on rep #7) and not "vibrating voice." Pt able to correct on next production and maintain until rep #10. With numbers, SLP encouraged pt to "get higher than you tihnk you can" and her range improved dramatically. She was able to maintain this production range for all remaining reps. She performed high and low sentences with initial cue only. SLP told pt she may want to consider keeping a straw in her office and use it for straw phonation 3-4-5 times a day in order to habituate relaxed voice. If pt has relaxed voice 75% of next session overall and does not require SLP cues for PhoRTE, reduction to once/week will be strongly considered.   01/07/24:Rhodie saw Dr. Irene Pap this morning, told SLP she could see pt was still dealing with post nasal drip. Today, pt demonstrated relaxed speech approx 60% of the time during conversation with focus on relaxed voice. With PhoRTE, pt req'd mod A usually, faded to usual min A with procedure with numbers section of PhoRTE. She will cont with PhoRTE at home, following SOVTE (straw singing).  12/31/23: Pt practiced once Friday morning, and twice Sunday and once in car coming to ST today. Pt appears to be dealing with persistent phlegm from a URI/cold last week. Clear voice observed 70% of the time with reading sentences.  SLP introduced pt to PhoRTE today and pt req'd mod A occasionally faded to  rare min A. She req'd more cues for wider pitch range with numbers and lower voice for reading sentences task. See "pt instructions" for details.   12/27/23: SOVTE tasks (lip or tongue trill) paired with word level responses 88% WNL voicing. SLP attempted to incr complexity to sentences read without trill however pt success decr'd to 68%. SLP added trill back again and success improved to 80%. SLP then worked towards not using trill prior to production but pt req'd use of trill for success > 80%. Homework for lip/tongue trill with words x10, then practice with lip/tongue trill with either word or phrase response x10 minutes (BID) or x15 minutes (every day).  12/25/23: SLP should check H2O intake next session. Pt ran out of Zyzal and wonders if congestion for last few days has anything to  do with this as well. SOVTE completed with initial cues to start lower pitch and go to higher pitch. Mostly-WNL voicing heard after 5 reps of lip trills and 5 reps of tongue trills. Pt stated she had been focusing more on AB and feeling a relaxed voice since last session. SLP worked with pt in sentence tasks and she exhibited WNL voice 70% and mostly-WNL voice 25%. AB was used 90% of the time. Homework to practice AB and "relaxed voice" for at least 15 minutes BID.   12/13/23: Pt has been practicing flow phonation but not SOVTE exercises. SLP targeted SOVTE since pt has not done those. SLP used "zh" ("measure") and pt with almost WNL voicing 90% of the time. The syllable "zha" was also successful as well as "zha zha" however "zha mama" was not successful. SLP went back to zha and then also did tongue and lip trills with WNL voicing. Pt was unable to transition to any real words at this time so homework is for "zh", tongue trill and lip trills with up and down pitch.  12/11/23: Pt had half of a Coke instead of full can of Coke last night. SLP cont to encourage pt to minimize caffeine and incr water (with a decaf drink mix if  necessary). Today SLP worked with pt on AB at rest. Today SLP guided pt through SOVTE with straw in water, out of water all focusing on buzz in mouth and nose. SLP then had pt work without straw with /u/, /mmmmu/. SLP drew pt's attention to a more WNL sounding voice quality without the "edge" to it that she had when she arrived today. Pt stated she felt the difference. Homework: see "pt instructions"  12/03/23 (eval): N/A  PATIENT EDUCATION: Education details: See "clinical impressions" below Person educated: Patient Education method: Explanation, Demonstration, Verbal cues, and Handouts Education comprehension: verbalized understanding, returned demonstration, verbal cues required, and needs further education  HOME EXERCISE PROGRAM: SOVTE, and other voice tasks at home. PhoRTE.   GOALS: Goals reviewed with patient? Yes  SHORT TERM GOALS: Target date: 01/03/24  Pt will complete exercises to reduce vocal tension (SOVTE and others) as directed at least days/week for two weeks via tracking sheet  Baseline: Goal status: met  2.  Pt will demo improved voice in sentence responses 70% in 3 sessions Baseline:  Goal status: Partially met  3.  Pt will achieve AB in sentence responses 70% in 3 sessions Baseline: 12/25/23, 12/31/23 Goal status: Met  4.  Pt will complete PhoRTE exercises with occasional min mod A Baseline:  Goal status: not met  5.  Pt will incr water intake to at least 48 oz/day at least 5 days in a 7 day period, as monitored by tracking sheet Baseline:  Goal status: Met   LONG TERM GOALS: Target date: 02/22/24  Pt will improve PROM Baseline:  Goal status: INITIAL  2.  Pt will decr s/z ratio to <1.0 Baseline:  Goal status: INITIAL  3.  Pt will complete exercises to improve voice/decr glottal gap as directed at least 6 days/week for two weeks via tracking sheet  Baseline:  Goal status: met  4.  Pt will incr water intake to at least 60 oz/day at least 5 days in a 7  day period, as monitored by tracking sheet Baseline:  Goal status: met  5  Pt will demo improved voice in 10 minutes conversation in 3 sessions Baseline: 01/15/24, 01/22/24 Goal status: INITIAL  6.  Pt will achieve AB  to at least 70% in 10 minutes conversation in 3 sessions Baseline: 01/15/24, 01/22/24 Goal status: INITIAL  ASSESSMENT:  CLINICAL IMPRESSION: Patient is a 63 y.o. 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 See "treatment date" for more details. Today pt completed "relaxed voice" tasks in order to maintain less hoarseness in speaking voice. Pt is highly motivated and based upon her willingness to participate in ST, she remains a good candidate for skilled ST targeting improved vocal quality. Frequency was reduced to once every other week due to progress today.  OBJECTIVE IMPAIRMENTS: include voice disorder. These impairments are limiting patient from return to work, household responsibilities, ADLs/IADLs, and effectively communicating at home and in community. Factors affecting potential to achieve goals and functional outcome are severity of impairments.. Patient will benefit from skilled SLP services to address above impairments and improve overall function.  REHAB POTENTIAL: Good  PLAN:  SLP FREQUENCY: every other week  SLP DURATION: 10 weeks  PLANNED INTERVENTIONS: Environmental controls, Internal/external aids, Functional tasks, SLP instruction and feedback, Compensatory strategies, Patient/family education, [phone] Treatment of speech (30 or 45 min) , and voice exercises    Janina Trafton, CCC-SLP 01/22/2024, 5:08 PM

## 2024-01-24 ENCOUNTER — Encounter: Payer: Self-pay | Admitting: Family

## 2024-01-24 ENCOUNTER — Ambulatory Visit: Payer: BC Managed Care – PPO | Admitting: Family

## 2024-01-24 VITALS — BP 120/60 | HR 76 | Temp 97.5°F | Ht 65.0 in | Wt 195.2 lb

## 2024-01-24 DIAGNOSIS — R600 Localized edema: Secondary | ICD-10-CM

## 2024-01-24 DIAGNOSIS — K121 Other forms of stomatitis: Secondary | ICD-10-CM

## 2024-01-24 DIAGNOSIS — E669 Obesity, unspecified: Secondary | ICD-10-CM | POA: Diagnosis not present

## 2024-01-24 DIAGNOSIS — E7800 Pure hypercholesterolemia, unspecified: Secondary | ICD-10-CM

## 2024-01-24 DIAGNOSIS — K219 Gastro-esophageal reflux disease without esophagitis: Secondary | ICD-10-CM

## 2024-01-24 DIAGNOSIS — R252 Cramp and spasm: Secondary | ICD-10-CM

## 2024-01-24 DIAGNOSIS — E559 Vitamin D deficiency, unspecified: Secondary | ICD-10-CM | POA: Diagnosis not present

## 2024-01-24 DIAGNOSIS — F411 Generalized anxiety disorder: Secondary | ICD-10-CM | POA: Insufficient documentation

## 2024-01-24 DIAGNOSIS — J4530 Mild persistent asthma, uncomplicated: Secondary | ICD-10-CM

## 2024-01-24 DIAGNOSIS — R739 Hyperglycemia, unspecified: Secondary | ICD-10-CM | POA: Diagnosis not present

## 2024-01-24 MED ORDER — ESCITALOPRAM OXALATE 5 MG PO TABS: ORAL_TABLET | ORAL | 0 refills | Status: DC

## 2024-01-24 MED ORDER — ESCITALOPRAM OXALATE 10 MG PO TABS: 10.0000 mg | ORAL_TABLET | Freq: Every day | ORAL | 3 refills | Status: DC

## 2024-01-24 MED ORDER — BACLOFEN 10 MG PO TABS: 10.0000 mg | ORAL_TABLET | Freq: Three times a day (TID) | ORAL | 2 refills | Status: AC

## 2024-01-24 NOTE — Progress Notes (Signed)
 Subjective:    Patient ID: Carla Morales, female    DOB: 1961/01/07, 63 y.o.   MRN: [id number]  Chief Complaint  Patient presents with   Medical Management of Chronic Issues   Mouth Lesions   Pt presents to the office today for chronic follow up. She is followed by GYN annually.    She is followed by GI for GERD and EOE.   She is followed by ENT for hoarse voice.   Also, asthma and allergen every 6 months. Her breathing is controlled.   She is complaining of bilateral toe cramping and left leg cramping that occurs several times a night. She is taking magnesium and reports this has helped.  Asthma She complains of hoarse voice. There is no cough, shortness of breath or wheezing. This is a chronic problem. The current episode started more than 1 year ago. The problem occurs intermittently. Associated symptoms include heartburn. Her symptoms are alleviated by rest and beta-agonist. She reports moderate improvement on treatment. Her past medical history is significant for asthma.  Gastroesophageal Reflux She complains of belching, heartburn and a hoarse voice. She reports no coughing or no wheezing. This is a chronic problem. The current episode started more than 1 year ago. The problem occurs occasionally. The symptoms are aggravated by certain foods. Risk factors include obesity. She has tried a PPI for the symptoms. The treatment provided moderate relief.  Hyperlipidemia This is a chronic problem. The current episode started more than 1 year ago. The problem is controlled. Exacerbating diseases include obesity. Pertinent negatives include no shortness of breath. Current antihyperlipidemic treatment includes statins. The current treatment provides moderate improvement of lipids. Risk factors for coronary artery disease include dyslipidemia, hypertension, a sedentary lifestyle and post-menopausal.  Mouth Lesions  The current episode started 5 to 7 days ago. The problem has been  gradually improving. Associated symptoms include mouth sores. Pertinent negatives include no cough and no wheezing.  Anxiety Presents for follow-up visit. Symptoms include decreased concentration, excessive worry, irritability, nervous/anxious behavior and restlessness. Patient reports no shortness of breath. Symptoms occur most days. The severity of symptoms is moderate.   Her past medical history is significant for asthma.      Review of Systems  Constitutional:  Positive for irritability.  HENT:  Positive for hoarse voice and mouth sores.   Respiratory:  Negative for cough, shortness of breath and wheezing.   Gastrointestinal:  Positive for heartburn.  Psychiatric/Behavioral:  Positive for decreased concentration. The patient is nervous/anxious.   All other systems reviewed and are negative.  Family History  Problem Relation Age of Onset   Allergic rhinitis Mother    Asthma Mother    Rheum arthritis Mother    Fibromyalgia Mother    Cancer Mother        breast, uterine, adenocarcinoma-right lung   Uterine cancer Mother        Sarcoma   Lung cancer Mother        Adenocarcinoma   Breast cancer Mother 8   Hypertension Father    Melanoma Father 70   Asthma Sister    Asthma Maternal Aunt    Diabetes Maternal Aunt    Cancer Maternal Aunt 43       GYN cancer - possibly vulvar?   Lung cancer Maternal Aunt    Diabetes Maternal Uncle    Appendicitis Maternal Uncle        d. 12   Diabetes Paternal Aunt    Breast cancer Paternal Aunt  50   Breast cancer Paternal Aunt 78   Uterine cancer Paternal Aunt 39   Breast cancer Paternal Aunt 67   Diabetes Paternal Uncle    Other Maternal Grandmother 30       childbirth   Heart disease Maternal Grandfather    Cancer Paternal Grandmother        unknown   Other Daughter        Phyllodes tumor   Other Nephew        Gilbert's Disease   Colon cancer Neg Hx    Esophageal cancer Neg Hx    Ulcerative colitis Neg Hx    Stomach cancer  Neg Hx    Pancreatic cancer Neg Hx    Social History   Socioeconomic History   Marital status: Married    Spouse name: Not on file   Number of children: 1   Years of education: Not on file   Highest education level: Not on file  Occupational History   Not on file  Tobacco Use   Smoking status: Never    Passive exposure: Never   Smokeless tobacco: Never  Vaping Use   Vaping status: Never Used  Substance and Sexual Activity   Alcohol use: No    Alcohol/week: 0.0 standard drinks of alcohol   Drug use: No   Sexual activity: Yes    Partners: Male    Birth control/protection: Surgical    Comment: hyst.-1st intercourse 19 yo-1 partner, menarche 16yo  Other Topics Concern   Not on file  Social History Narrative   Not on file   Social Drivers of Health   Financial Resource Strain: Not on file  Food Insecurity: Not on file  Transportation Needs: Not on file  Physical Activity: Not on file  Stress: Not on file  Social Connections: Not on file        Objective:   Physical Exam Vitals reviewed.  Constitutional:      General: She is not in acute distress.    Appearance: She is well-developed. She is obese.  HENT:     Head: Normocephalic and atraumatic.     Right Ear: Tympanic membrane normal.     Left Ear: Tympanic membrane normal.  Eyes:     Pupils: Pupils are equal, round, and reactive to light.  Neck:     Thyroid: No thyromegaly.  Cardiovascular:     Rate and Rhythm: Normal rate and regular rhythm.     Heart sounds: Normal heart sounds. No murmur heard. Pulmonary:     Effort: Pulmonary effort is normal. No respiratory distress.     Breath sounds: Normal breath sounds. No wheezing.  Abdominal:     General: Bowel sounds are normal. There is no distension.     Palpations: Abdomen is soft.     Tenderness: There is no abdominal tenderness.  Musculoskeletal:        General: No tenderness. Normal range of motion.     Cervical back: Normal range of motion and neck  supple.  Skin:    General: Skin is warm and dry.  Neurological:     Mental Status: She is alert and oriented to person, place, and time.     Cranial Nerves: No cranial nerve deficit.     Deep Tendon Reflexes: Reflexes are normal and symmetric.  Psychiatric:        Behavior: Behavior normal.        Thought Content: Thought content normal.        Judgment:  Judgment normal.       BP 120/60   Pulse 76   Temp (!) 97.5 F (36.4 C) (Temporal)   Ht 5\' 5"  (1.651 m)   Wt 195 lb 3.2 oz (88.5 kg)   LMP 07/24/2010   BMI 32.48 kg/m      Assessment & Plan:  Alee Gressman comes in today with chief complaint of Medical Management of Chronic Issues and Mouth Lesions   Diagnosis and orders addressed:  1. Pure hypercholesterolemia (Primary) - CMP14+EGFR - CBC with Differential/Platelet  2. Obesity (BMI 30-39.9) - CMP14+EGFR - CBC with Differential/Platelet  3. Gastroesophageal reflux disease, unspecified whether esophagitis present - CMP14+EGFR - CBC with Differential/Platelet  4. Vitamin D deficiency - CMP14+EGFR - CBC with Differential/Platelet  5. Mild persistent asthma without complication - CMP14+EGFR - CBC with Differential/Platelet  6. Peripheral edema - CMP14+EGFR - CBC with Differential/Platelet  7. Mouth ulcer - CMP14+EGFR - CBC with Differential/Platelet  8. Leg cramp  - CMP14+EGFR - CBC with Differential/Platelet - Magnesium - baclofen (LIORESAL) 10 MG tablet; Take 1 tablet (10 mg total) by mouth 3 (three) times daily.  Dispense: 180 each; Refill: 2  9. GAD (generalized anxiety disorder) - escitalopram (LEXAPRO) 5 MG tablet; Take 1 tablet (5 mg total) by mouth daily for 14 days, THEN 2 tablets (10 mg total) daily for 14 days.  Dispense: 42 tablet; Refill: 0 - escitalopram (LEXAPRO) 10 MG tablet; Take 1 tablet (10 mg total) by mouth daily.  Dispense: 90 tablet; Refill: 3   Labs pending Will stop hydrochlorothiazide 12.5 mg today related to leg  cramps to see if this improves.  Continue to force fluids Will start Lexapro 5 mg today for 2 weeks then increase to 10 mg  Stress management  Health Maintenance reviewed Diet and exercise encouraged  Follow up plan: 6 weeks to recheck GAD   Jannifer Rodney, FNP

## 2024-01-24 NOTE — Patient Instructions (Signed)
 Leg Cramps: What They Mean Leg cramps happen when one or more muscles tighten and there's no control over it. They can happen during exercise or when you're resting. Leg cramps are painful and can last for a few seconds to minutes. They can also come back many times before stopping. Usually, leg cramps aren't caused by a serious medical problem. Often, the cause isn't known. Some common causes include: Problems with moving or not moving the body, like: Working your muscles too hard, such as during intense exercise. Doing the same motion over and over. Not warming up or stretching before playing sports or doing activities. Using the wrong technique or form when playing sports or doing activities. Staying in one position for a long time. Water or electrolyte balance issues, like: Not drinking enough fluids or being dehydrated. Getting sick from too much heat. Having low levels of minerals called electrolytes in your blood, like potassium and calcium. This can happen from: Pregnancy. Taking medicines that make you pee more, also called diuretic medicines. Not getting enough nutrients from your diet. Side effects of some medicines. Follow these instructions at home: Eating and drinking Eat and drink as told. Eat a healthy diet that includes plenty of nutrients to help your muscles work well. A healthy diet includes fruits and vegetables, lean protein, whole grains, and low-fat or nonfat dairy products. Drink enough fluids to keep your pee pale yellow. Drinking more water may help prevent cramps. Managing pain and muscle cramping     Massage, stretch, and relax the cramped muscle. Do this for several minutes at a time. Use ice or an ice pack as told. Place a towel between your skin and the ice. Leave the ice on for 20 minutes, 2-3 times a day. Use heat as told. Use the heat source that your provider recommends, such as a moist heat pack or a heating pad. Do this as often as told. Place a  towel between your skin and the heat source. Leave the heat on for 20-30 minutes. If your skin turns red, take off the ice or heat right away to prevent skin damage. The risk of damage is higher if you can't feel pain, heat, or cold. Take hot showers or baths to help relax tight muscles. General instructions If you're having a lot of leg cramps, avoid hard workouts for several days. Take supplements and medicines only as told. Contact a health care provider if: Your leg cramps get worse or happen more often. Your leg cramps don't get better over time. Your foot becomes cold, numb, or blue. This information is not intended to replace advice given to you by your health care provider. Make sure you discuss any questions you have with your health care provider. Document Revised: 07/11/2023 Document Reviewed: 07/11/2023 Elsevier Patient Education  2024 [employer].

## 2024-01-25 LAB — CMP14+EGFR
ALT: 24 IU/L (ref 0–32)
AST: 19 IU/L (ref 0–40)
Albumin: 4.2 g/dL (ref 3.8–4.9)
Alkaline Phosphatase: 166 IU/L — ABNORMAL HIGH (ref 44–121)
BUN/Creatinine Ratio: 16 (ref 12–28)
BUN: 14 mg/dL (ref 8–27)
Bilirubin Total: 0.4 mg/dL (ref 0.0–1.2)
CO2: 22 mmol/L (ref 20–29)
Calcium: 9.8 mg/dL (ref 8.7–10.3)
Chloride: 103 mmol/L (ref 96–106)
Creatinine, Ser: 0.86 mg/dL (ref 0.57–1.00)
Globulin, Total: 2.6 g/dL (ref 1.5–4.5)
Glucose: 141 mg/dL — ABNORMAL HIGH (ref 70–99)
Potassium: 4.3 mmol/L (ref 3.5–5.2)
Sodium: 143 mmol/L (ref 134–144)
Total Protein: 6.8 g/dL (ref 6.0–8.5)
eGFR: 77 mL/min/{1.73_m2} (ref >59)

## 2024-01-25 LAB — CBC WITH DIFFERENTIAL/PLATELET
Basophils Absolute: 0.1 10*3/uL (ref 0.0–0.2)
Basos: 1 %
EOS (ABSOLUTE): 0.3 10*3/uL (ref 0.0–0.4)
Eos: 4 %
Hematocrit: 39.3 % (ref 34.0–46.6)
Hemoglobin: 12.9 g/dL (ref 11.1–15.9)
Immature Grans (Abs): 0 10*3/uL (ref 0.0–0.1)
Immature Granulocytes: 0 %
Lymphocytes Absolute: 2.1 10*3/uL (ref 0.7–3.1)
Lymphs: 27 %
MCH: 29.3 pg (ref 26.6–33.0)
MCHC: 32.8 g/dL (ref 31.5–35.7)
MCV: 89 fL (ref 79–97)
Monocytes Absolute: 0.6 10*3/uL (ref 0.1–0.9)
Monocytes: 7 %
Neutrophils Absolute: 4.9 10*3/uL (ref 1.4–7.0)
Neutrophils: 61 %
Platelets: 293 10*3/uL (ref 150–450)
RBC: 4.4 x10E6/uL (ref 3.77–5.28)
RDW: 14.5 % (ref 11.7–15.4)
WBC: 7.9 10*3/uL (ref 3.4–10.8)

## 2024-01-25 LAB — MAGNESIUM: Magnesium: 2.2 mg/dL (ref 1.6–2.3)

## 2024-01-28 ENCOUNTER — Ambulatory Visit: Payer: BC Managed Care – PPO

## 2024-01-28 LAB — SPECIMEN STATUS REPORT

## 2024-01-28 LAB — HGB A1C W/O EAG: Hgb A1c MFr Bld: 7.2 % — ABNORMAL HIGH (ref 4.8–5.6)

## 2024-01-30 ENCOUNTER — Other Ambulatory Visit: Payer: Self-pay | Admitting: Family

## 2024-01-30 DIAGNOSIS — Z1231 Encounter for screening mammogram for malignant neoplasm of breast: Secondary | ICD-10-CM

## 2024-01-31 ENCOUNTER — Other Ambulatory Visit: Payer: Self-pay | Admitting: Family

## 2024-01-31 DIAGNOSIS — E1169 Type 2 diabetes mellitus with other specified complication: Secondary | ICD-10-CM

## 2024-01-31 MED ORDER — METFORMIN HCL 500 MG PO TABS: 500.0000 mg | ORAL_TABLET | Freq: Two times a day (BID) | ORAL | 1 refills | Status: DC

## 2024-02-01 ENCOUNTER — Other Ambulatory Visit: Payer: Self-pay | Admitting: Internal Medicine

## 2024-02-04 ENCOUNTER — Telehealth: Payer: Self-pay

## 2024-02-04 NOTE — Progress Notes (Signed)
 Care Guide Pharmacy Note  02/04/2024 Name: Kinnedy Mongiello MRN: [id number] DOB: 1961/06/18  Referred By: Junie Spencer, FNP Reason for referral: Care Coordination (Outreach to schedule with Pharm d )   Eliannah Hinde is a 63 y.o. year old female who is a primary care patient of Junie Spencer, FNP.  Jamita Mckelvin Mapps was referred to the pharmacist for assistance related to: DMII  Successful contact was made with the patient to discuss pharmacy services including being ready for the pharmacist to call at least 5 minutes before the scheduled appointment time and to have medication bottles and any blood pressure readings ready for review. The patient agreed to meet with the pharmacist via telephone visit on (date/time).02/28/2024  Penne Lash , RMA     [place]  [facility], [facility] Guide  Direct Dial: [phone]  Website: [email or web address]

## 2024-02-04 NOTE — Therapy (Signed)
 OUTPATIENT SPEECH LANGUAGE PATHOLOGY VOICE TREATMENT   Patient Name: Carla Morales MRN: [id number] DOB:1961-04-27, 63 y.o., female Today's Date: 02/05/2024  PCP: Jannifer Rodney, FNP REFERRING PROVIDER: Ashok Croon, MD  END OF SESSION:  End of Session - 02/05/24 0853     Visit Number 11    Number of Visits 17    Date for SLP Re-Evaluation 02/22/24    SLP Start Time 0803    SLP Stop Time  0846    SLP Time Calculation (min) 43 min    Activity Tolerance Patient tolerated treatment well                      Past Medical History:  Diagnosis Date   Adenomatous colon polyp    Allergy    Arthritis    Asthma    Chondrocalcinosis    COVID-19    Diverticulosis    Elevated alkaline phosphatase level    Elevated antinuclear antibody (ANA) level    Eosinophilic esophagitis    Esophageal dysmotility    Esophageal stenosis    Fatty liver    GERD (gastroesophageal reflux disease)    Hyperlipidemia    Internal hemorrhoids    Migraine    Osteoarthritis    Pinched nerve in neck    Schatzki's ring    Sinus congestion    Urinary incontinence    Past Surgical History:  Procedure Laterality Date   ABDOMINAL HYSTERECTOMY     bladder dialation  1968   as a child   COLONOSCOPY     TONSILLECTOMY  1971   TOTAL VAGINAL HYSTERECTOMY  07/2010   menorrhagia, leiomyoma, prolapse   TUBAL LIGATION  1998   Patient Active Problem List   Diagnosis Date Noted   GAD (generalized anxiety disorder) 01/24/2024   Leg cramp 01/24/2024   Bilateral pes planus 10/16/2023   Mouth ulcer 10/10/2023   Eosinophilic esophagitis 10/10/2023   Osteoarthritis of left knee 09/18/2023   Pruritus 03/28/2023   Allergic conjunctivitis of both eyes 03/28/2023   Peripheral edema 07/11/2021   Asthma 01/07/2021   GERD (gastroesophageal reflux disease) 10/29/2019   Vitamin D deficiency 10/29/2019   Family history of breast cancer    Family history of uterine cancer    Family history of  lung cancer    Family history of melanoma    Obesity (BMI 30-39.9) 03/29/2018   Perennial allergic rhinitis    Hyperlipidemia     Onset date: 2020, worse 18 months ago  REFERRING DIAG:  R49.0 (ICD-10-CM) - Dysphonia  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   Pt accompanied by: self  PERTINENT HISTORY: GERD, Eosinophilic esophagitis, peripheral edema, asthma, mouth ulcer  PAIN:  Are you having pain? No  FALLS: Has patient fallen in last 6 months? Yes, Number of falls: 1  PATIENT GOALS: Hoarseness going away  OBJECTIVE:  Note: Objective measures were completed at Evaluation unless otherwise noted.  DIAGNOSTIC FINDINGS:  ENT - [place] 11/12/23 Procedure: The patient was seated upright in the exam chair.   Topical lidocaine and Afrin were applied to the nasal cavity. After adequate anesthesia had occurred, the flexible telescope was passed into the nasal cavity. The nasopharynx was patent without mass or lesion. The scope was passed behind the soft palate  and directed toward the base of tongue. The base of tongue was visualized and was symmetric with no apparent masses or abnormal appearing tissue. There were no signs of a mass or pooling of secretions in the piriform sinuses. The supraglottic structures were normal.   The true vocal cords are mobile. The medial edges were bowed. Closure was incomplete with spindle-shaped glottic gap and supraglottic compression. Periodicity present. The mucosal wave and amplitude were symmetric and present. There is moderate interarytenoid pachydermia and post cricoid edema. The mucosa appears without lesions.   The laryngoscope was then slowly withdrawn and the patient tolerated the procedure well. There were no  complications or blood loss.Assessment and Plan    Chronic dysphonia and Voice Fatigue Recurrent hoarseness and throat pain, exacerbated by talking and singing. Vocal folds appear thin with incomplete closure and mild edema on videostrobe today. No evidence of tumor or mass/no lesions mucosal wave intact. She had significant supraglottic compression and I believe there is an element of muscle tension which might cause throat discomfort/voice fatigue over time.  Discussed voice therapy as a first-line treatment to optimize breath support and reduce symptoms. Potential future interventions include vocal fold filler injection augmentation vs trial of Botox if voice therapy is ineffective and repeat exam will be c/w laryngeal spasm.  - Refer to voice therapy - Optimize reflux control - Continue current management of postnasal drainage - Consider elimination diet for EoE  - trial of Reflux Gourmet   Gastroesophageal Reflux Disease (GERD) GERD managed with Dexilant. Symptoms include throat pain and possible contribution to voice changes. Discussed optimizing reflux control through dietary adjustments and natural supplements. - Continue Dexilant at current dose - Optimize reflux control through dietary adjustments and trial of reflux gourmet   Eosinophilic Esophagitis (EOE) EOE managed with Dupixent and Dexilant. No significant improvement noted on recent endoscopy per report. No formal elimination diet in the past. Discussed the six-food elimination diet to identify specific triggers and improve symptoms. - Provide information on six-food elimination diet - Encourage dietary adjustments to identify specific triggers   Asthma and Environmental Allergies Post-nasal drainage Nasal endoscopy today with S-shaped septum and septal deviation but no evidence of polyps or no pus to suggest sinusitis  Asthma with episodes of uncontrolled symptoms requiring increased medication. Currently on Dupixent, Dulera,  Flonase/Asteline/Ipratropium Bromide. Discussed the importance of rinsing mouth after using inhalers to prevent thrush. - Continue current asthma medications (Dupixent, Dulera) - Monitor for symptom control and adjust medications as needed - continue Flonase/Asteline/Ipratropium Bromide daily 2 puffs b/l nares  - continue Xyzal 5 mg daily    Recurrent Thrush - no evidence of thrush on exam today Recurrent thrush associated with steroid use for asthma. No current evidence of thrush on examination. Discussed the importance of rinsing mouth after using inhalers to prevent recurrence. 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 No current ulceration or significant symptoms noted. Discussed monitoring for any changes or recurrence of symptoms and potential biopsy if ulceration recurs. - Monitor for any changes or recurrence of symptoms - Consider biopsy if ulceration recurs  TREATMENT DATE:  Semi-Occluded Vocal Tract Exercises= SOVTE; Abdominal breathing=AB, Phonatory Resistance Training Exercises=PhoRTE  02/05/24: Pt completing PhoRTE "mostly" BID. Pt cont to maintain H2O >60 oz/daily. In 15 minute conversation today she used AB 90% success.  Pt cont with "rattle" intermittently throughout the day, and heard today, but maintains that her voice is definitely not tight like it was prior to this course of ST. SLP postulates rough quality heard is due to remaining glottal gap. Awaiting ENT follow up. Today she sustained /a/ 9.1 seconds, which is 2 seconds better than at eval (7.02). PhoRTE completed with min A with high pitched sentences, faded to independence x10 consecutive correct  productions. Pt and SLP agreed once a week x4, following pt's ENT follow up, was warranted and can be cancelled as necessary.  01/22/24: Pt's use of relaxed voice upon entering ST room was 50-60%. Pt maintained AB 100% of the time in conversation of 15 minutes. SLP encouraged pt to perform pursed lip singing to refocus "relaxed voice" when she experienced more hoarseness after speaking for 20 minutes. After pt did this her voice was less hoarse and maintained for 10 minutes. Cont to drink >60 oz H2O daily and completing PhoRTE on her own. Pt will be seen every other week at this time.   01/15/24: SLP engaged pt in 10 minutes conversation with 90% AB. Relaxed voice (less "edge" to pt's voice) heard 70% of the time. Pt is still drinking >60 oz water and has forgone caffeinated drinks after breakfast/morning. PhoRTE exercises completed today with initial cue for /a/ to make an /a/ and not "aw" vowel. Pt independent after this. SLP and pt agreed she could be seen once every week.  01/09/24: Water average remains >60 oz, began last week. Pt arrived today with relaxed voice (almost WNL quality) 70-75% for initial conversation with SLP for 5 minutes. By session end, SLP heard almost WNL voice quality approx 50% of session. Pt performed PhoRTE with /a/ rare min A for relaxed voice (on rep #7) and not "vibrating voice." Pt able to correct on next production and maintain until rep #10. With numbers, SLP encouraged pt to "get higher than you tihnk you can" and her range improved dramatically. She was able to maintain this production range for all remaining reps. She performed high and low sentences with initial cue only. SLP told pt she may want to consider keeping a straw in her office and use it for straw phonation 3-4-5 times a day in order to habituate relaxed voice. If pt has relaxed voice 75% of next session overall and does not require SLP cues for PhoRTE, reduction to once/week will be strongly considered.    01/07/24:Tylar saw Dr. Irene Pap this morning, told SLP she could see pt was still dealing with post nasal drip. Today, pt demonstrated relaxed speech approx 60% of the time during conversation with focus on relaxed voice. With PhoRTE, pt req'd mod A usually, faded to usual min A with procedure with numbers section of PhoRTE. She will cont with PhoRTE at home, following SOVTE (straw singing).  12/31/23: Pt practiced once Friday morning, and twice Sunday and once in car coming to ST today. Pt appears to be dealing with persistent phlegm from a URI/cold last week. Clear voice observed 70% of the time with reading sentences.  SLP introduced pt to PhoRTE today and pt req'd mod A occasionally faded to rare min A. She req'd more cues for wider pitch range with numbers and lower voice for reading  sentences task. See "pt instructions" for details.   12/27/23: SOVTE tasks (lip or tongue trill) paired with word level responses 88% WNL voicing. SLP attempted to incr complexity to sentences read without trill however pt success decr'd to 68%. SLP added trill back again and success improved to 80%. SLP then worked towards not using trill prior to production but pt req'd use of trill for success > 80%. Homework for lip/tongue trill with words x10, then practice with lip/tongue trill with either word or phrase response x10 minutes (BID) or x15 minutes (every day).  12/25/23: SLP should check H2O intake next session. Pt ran out of Zyzal and wonders if congestion for last few days has anything to do with this as well. SOVTE completed with initial cues to start lower pitch and go to higher pitch. Mostly-WNL voicing heard after 5 reps of lip trills and 5 reps of tongue trills. Pt stated she had been focusing more on AB and feeling a relaxed voice since last session. SLP worked with pt in sentence tasks and she exhibited WNL voice 70% and mostly-WNL voice 25%. AB was used 90% of the time. Homework to practice AB and "relaxed  voice" for at least 15 minutes BID.   12/13/23: Pt has been practicing flow phonation but not SOVTE exercises. SLP targeted SOVTE since pt has not done those. SLP used "zh" ("measure") and pt with almost WNL voicing 90% of the time. The syllable "zha" was also successful as well as "zha zha" however "zha mama" was not successful. SLP went back to zha and then also did tongue and lip trills with WNL voicing. Pt was unable to transition to any real words at this time so homework is for "zh", tongue trill and lip trills with up and down pitch.  12/11/23: Pt had half of a Coke instead of full can of Coke last night. SLP cont to encourage pt to minimize caffeine and incr water (with a decaf drink mix if necessary). Today SLP worked with pt on AB at rest. Today SLP guided pt through SOVTE with straw in water, out of water all focusing on buzz in mouth and nose. SLP then had pt work without straw with /u/, /mmmmu/. SLP drew pt's attention to a more WNL sounding voice quality without the "edge" to it that she had when she arrived today. Pt stated she felt the difference. Homework: see "pt instructions"  12/03/23 (eval): N/A  PATIENT EDUCATION: Education details: See "clinical impressions" below Person educated: Patient Education method: Explanation, Demonstration, Verbal cues, and Handouts Education comprehension: verbalized understanding, returned demonstration, verbal cues required, and needs further education  HOME EXERCISE PROGRAM: SOVTE, and other voice tasks at home. PhoRTE.   GOALS: Goals reviewed with patient? Yes  SHORT TERM GOALS: Target date: 01/03/24  Pt will complete exercises to reduce vocal tension (SOVTE and others) as directed at least days/week for two weeks via tracking sheet  Baseline: Goal status: met  2.  Pt will demo improved voice in sentence responses 70% in 3 sessions Baseline:  Goal status: Partially met  3.  Pt will achieve AB in sentence responses 70% in 3  sessions Baseline: 12/25/23, 12/31/23 Goal status: Met  4.  Pt will complete PhoRTE exercises with occasional min mod A Baseline:  Goal status: not met  5.  Pt will incr water intake to at least 48 oz/day at least 5 days in a 7 day period, as monitored by tracking sheet Baseline:  Goal status: Met  LONG TERM GOALS: Target date: 02/22/24  Pt will improve PROM Baseline:  Goal status: INITIAL  2.  Pt will decr s/z ratio to <1.0 Baseline:  Goal status: INITIAL  3.  Pt will complete exercises to improve voice/decr glottal gap as directed at least 6 days/week for two weeks via tracking sheet  Baseline:  Goal status: met  4.  Pt will incr water intake to at least 60 oz/day at least 5 days in a 7 day period, as monitored by tracking sheet Baseline:  Goal status: met  5  Pt will demo improved voice in 10 minutes conversation in 3 sessions Baseline: 01/15/24, 01/22/24 Goal status: INITIAL  6.  Pt will achieve AB to at least 70% in 10 minutes conversation in 3 sessions Baseline: 01/15/24, 01/22/24 Goal status: Met  ASSESSMENT:  CLINICAL IMPRESSION: Patient is a 63 y.o. 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 See "treatment date" for more details. Today pt completed "relaxed voice" tasks in order to maintain less hoarseness in speaking voice. Pt is highly motivated and based upon her willingness to participate in ST, she remains a good candidate for skilled ST targeting improved vocal quality. Frequency was reduced to once every other week due to progress today.  OBJECTIVE IMPAIRMENTS: include voice disorder. These impairments are limiting patient from return to work, household responsibilities, ADLs/IADLs, and effectively communicating at home and in community. Factors affecting potential to achieve goals and functional outcome  are severity of impairments.. Patient will benefit from skilled SLP services to address above impairments and improve overall function.  REHAB POTENTIAL: Good  PLAN:  SLP FREQUENCY: every other week  SLP DURATION: 10 weeks  PLANNED INTERVENTIONS: Environmental controls, Internal/external aids, Functional tasks, SLP instruction and feedback, Compensatory strategies, Patient/family education, [phone] Treatment of speech (30 or 45 min) , and voice exercises    Neyla Gauntt, CCC-SLP 02/05/2024, 8:53 AM

## 2024-02-05 ENCOUNTER — Ambulatory Visit: Payer: BC Managed Care – PPO

## 2024-02-05 DIAGNOSIS — R490 Dysphonia: Secondary | ICD-10-CM | POA: Diagnosis not present

## 2024-02-06 ENCOUNTER — Ambulatory Visit
Admission: RE | Admit: 2024-02-06 | Discharge: 2024-02-06 | Disposition: A | Discharge status: Home / Self Care | Source: Ambulatory Visit | Attending: Family | Admitting: Family

## 2024-02-06 DIAGNOSIS — Z1231 Encounter for screening mammogram for malignant neoplasm of breast: Secondary | ICD-10-CM | POA: Diagnosis not present

## 2024-02-08 NOTE — Progress Notes (Signed)
 FOLLOW UP Date of Service/Encounter:  02/11/24  Subjective:  Carla Morales (DOB: 1961/05/28) is a 63 y.o. female who [facility] in re-evaluation of the following: asthma, allergic rhinitis, pruritus , eosinophilic esophagitis History obtained from: chart review and patient.  For Review, LV was on 08/13/23  with Dr.Kale Dols seen for routine follow-up. See below for summary of history and diagnostics.   Therapeutic plans/changes recommended: Fev1 91%, doing well on dupixent and dulera 200-discussed potentially stepping down to 100 at FU if controlled ----------------------------------------------------- Pertinent History/Diagnostics:  Asthma: Diagnosed as an adult. Sx: SOB, moderate exercise intolerance. 2-3 courses systemic steroids in 2023-2024. No prior hospitalizations. Triggers: exercise and respiratory illness, symptoms worsened after COVID infections in 2020 and 2021. No smoke exposure. QVAR gave her oral thrush.  -  AEC (07/13/22): 200 -  CXR on (06/282024): normal - normal spirometry (03/28/23): ratio 0.86, 92% FEV1 Allergic Rhinitis:  hoarse voice, feeling something is stuck in her throat, eyes watery, feel blurry like a film over them, runny nose, generalized pruritus Occurs year-round with seasonal flares-spring and fall Has reflux. Follows with GI on dexilant recently switched to vonoprazan (03/29/23), scope concerning for EoE without histologic diagnosis. Chokes on dry meats. Has had 2 esophageal dilations. S/p tonsillectomy. - SPT environmental panel (03/28/23): skin testing borderline to dust mites, intradermals positive to minor molds  - SPT most common food allergens + potatoes, chicken, pork, beef and pineapple (03/28/23) negative Generalized pruritus:  NASH present, no rash unless scratches.  --------------------------------------------------- Today presents for follow-up. Discussed the use of AI scribe software for  clinical note transcription with the patient, who gave verbal consent to proceed.  History of Present Illness   Carla Morales is a 63 year old female with asthma and allergies who presents for a follow-up visit.  Asthma symptoms have improved since starting Dupixent. She uses her Dulera inhaler once at night and twice in the morning, with minimal need for the rescue inhaler, only a couple of times in the last two months. There is a noted decrease in coughing, attributed to better control of eosinophilic esophagitis. She continues on her dupixent weekly, prescribed by GI.  Allergies are well-controlled with the current regimen. She uses two sprays of Flonase in the morning, a saline spray, and one spray of ipratropium at night. Astelin is used as needed, typically one spray in the morning and one at night, and she continues to take Xyzal over the counter. For eye symptoms, she uses Pataday drops, purchased over the counter.  She mentions a persistent smell at her workplace following air duct cleaning and sealant application, which is bothersome but dissipates after a while. She has moved offices due to recurring dead ants and has taken measures such as using an air purifier.  She is experiencing occasional itching but improved from prior.   She has completed vocal therapy for hoarseness and will follow up with ENT for further assessment.      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 Reflux gourmet trial. Consider elimination diet for EoE. Nasal endoscopy without evidence of polyps  All medications reviewed by clinical staff and updated in chart. No new pertinent medical or surgical history except as noted in HPI.  ROS: All others negative except as noted per HPI.   Objective:  BP 114/80 (BP Location: Left Arm, Patient Position: Sitting, Cuff Size: Normal)   Pulse 73  Temp 97.7 F (36.5 C) (Temporal)   Resp 18    LMP 07/24/2010   SpO2 94%  There is no height or weight on file to calculate BMI. Physical Exam: General Appearance:  Alert, cooperative, no distress, appears stated age  Head:  Normocephalic, without obvious abnormality, atraumatic  Eyes:  Conjunctiva clear, EOM's intact  Ears EACs normal bilaterally and normal TMs bilaterally  Nose: Nares normal, hypertrophic turbinates, normal mucosa, no visible anterior polyps, and septum midline  Throat: Lips, tongue normal; teeth and gums normal, normal posterior oropharynx  Neck: Supple, symmetrical  Lungs:   clear to auscultation bilaterally, Respirations unlabored, no coughing  Heart:  regular rate and rhythm and no murmur, Appears well perfused  Extremities: No edema  Skin: ~2.5 cm round mobile lump on left outer lower leg, no other notable rashes and Skin color, texture, turgor normal  Neurologic: No gross deficits   Labs:  Lab Orders  No laboratory test(s) ordered today    Spirometry:  Tracings reviewed. Her effort: Good reproducible efforts. FVC: 2.73L FEV1: 2.40L, 91% predicted FEV1/FVC ratio: 0.88 Interpretation: Spirometry consistent with normal pattern.  Please see scanned spirometry results for details.   Assessment/Plan   Asthma -  controlled on dupixent for EoE Dulera 200 mcg 2 puffs once daily.  During respiratory illness/asthma flare: increase dulera 200 mcg 2 puffs twice daily for 1-2 weeks or until symptoms improve Continue albuterol 2 puffs once every 4 hours as needed for cough or wheeze You may use albuterol 2 puffs 5 to 15 minutes before activity to decrease cough or wheeze  Continue to rinse and brush teeth after using inhaler. Use with spacer.   Allergic rhinitis - at goal Continue allergen avoidance measures directed toward dust mite and mold as listed below Consider allergy injections Continue Xyzal 5 mg once a day as needed for runny nose or itch Continue Flonase 2 sprays in each nostril once a day as  needed for stuffy nose Continue azelastine 2 sprays in each nostril up to twice a day as needed for runny nose Consider saline nasal rinses as needed for nasal symptoms. Use this before any medicated nasal sprays for best result Continue to follow with ENT for vocal cords/hoarseness  Allergic conjunctivitis -at goal Some over the counter eye drops include Pataday one drop in each eye once a day as needed for red, itchy eyes OR Zaditor one drop in each eye twice a day as needed for red itchy eyes. Avoid eye drops that say red eye relief as they may contain medications that dry out your eyes.  Will prescribe Bepreve eyedrops which is covered you can use 1 drop twice daily as needed in place of the options listed above   Pruritus - at goal Continue Xyzal 5 mg once a day as needed for itch Continue hydroxyzine as needed for nighttime itch  Reflux and dysphagia/EoE-at goal, improved on Dupixent - continue medications as prescribed by GI - continue Dupixent weekly (prescribed by GI)  Leg bump:  -follow-up with PCP, suspect lipoma  Call the clinic if this treatment plan is not working well for you  Follow up in the clinic in 6 months, sooner if needed.   Other: none  Tonny Bollman, MD  [facility]

## 2024-02-11 ENCOUNTER — Encounter: Payer: Self-pay | Admitting: Internal Medicine

## 2024-02-11 ENCOUNTER — Other Ambulatory Visit: Payer: Self-pay

## 2024-02-11 ENCOUNTER — Ambulatory Visit: Payer: BC Managed Care – PPO | Admitting: Internal Medicine

## 2024-02-11 VITALS — BP 114/80 | HR 73 | Temp 97.7°F | Resp 18

## 2024-02-11 DIAGNOSIS — D1720 Benign lipomatous neoplasm of skin and subcutaneous tissue of unspecified limb: Secondary | ICD-10-CM

## 2024-02-11 DIAGNOSIS — K219 Gastro-esophageal reflux disease without esophagitis: Secondary | ICD-10-CM

## 2024-02-11 DIAGNOSIS — J4530 Mild persistent asthma, uncomplicated: Secondary | ICD-10-CM

## 2024-02-11 DIAGNOSIS — K200 Eosinophilic esophagitis: Secondary | ICD-10-CM | POA: Diagnosis not present

## 2024-02-11 DIAGNOSIS — H1013 Acute atopic conjunctivitis, bilateral: Secondary | ICD-10-CM

## 2024-02-11 DIAGNOSIS — J3089 Other allergic rhinitis: Secondary | ICD-10-CM

## 2024-02-11 DIAGNOSIS — L299 Pruritus, unspecified: Secondary | ICD-10-CM

## 2024-02-11 MED ORDER — FLUTICASONE PROPIONATE 50 MCG/ACT NA SUSP
2.0000 | Freq: Every day | NASAL | 1 refills | Status: DC
Start: 2024-02-11 — End: 2024-08-11

## 2024-02-11 MED ORDER — BEPOTASTINE BESILATE 1.5 % OP SOLN: 1.0000 [drp] | Freq: Two times a day (BID) | OPHTHALMIC | 1 refills | Status: DC | PRN

## 2024-02-11 MED ORDER — IPRATROPIUM BROMIDE 0.03 % NA SOLN: NASAL | 1 refills | Status: DC

## 2024-02-11 MED ORDER — DULERA 200-5 MCG/ACT IN AERO: 2.0000 | INHALATION_SPRAY | Freq: Two times a day (BID) | RESPIRATORY_TRACT | 1 refills | Status: DC

## 2024-02-11 MED ORDER — AZELASTINE HCL 0.1 % NA SOLN: 2.0000 | Freq: Two times a day (BID) | NASAL | 1 refills | Status: DC | PRN

## 2024-02-11 NOTE — Patient Instructions (Addendum)
 Asthma  Dulera 200 mcg 2 puffs once daily.  During respiratory illness/asthma flare: increase dulera 200 mcg 2 puffs twice daily for 1-2 weeks or until symptoms improve Continue albuterol 2 puffs once every 4 hours as needed for cough or wheeze You may use albuterol 2 puffs 5 to 15 minutes before activity to decrease cough or wheeze  Continue to rinse and brush teeth after using inhaler. Use with spacer.   Allergic rhinitis Continue allergen avoidance measures directed toward dust mite and mold as listed below Consider allergy injections Continue Xyzal 5 mg once a day as needed for runny nose or itch Continue Flonase 2 sprays in each nostril once a day as needed for stuffy nose Continue azelastine 2 sprays in each nostril up to twice a day as needed for runny nose Consider saline nasal rinses as needed for nasal symptoms. Use this before any medicated nasal sprays for best result Continue to follow with ENT for vocal cords/hoarseness  Allergic conjunctivitis-at goal Some over the counter eye drops include Pataday one drop in each eye once a day as needed for red, itchy eyes OR Zaditor one drop in each eye twice a day as needed for red itchy eyes. Avoid eye drops that say red eye relief as they may contain medications that dry out your eyes.  Will prescribe Bepreve eyedrops which is covered you can use 1 drop twice daily as needed in place of the options listed above   Pruritus-at goal Continue Xyzal 5 mg once a day as needed for itch Continue hydroxyzine as needed for nighttime itch  Reflux and dysphagia/EoE-at goal, improved on Dupixent - continue medications as prescribed by GI - continue Dupixent weekly (prescribed by GI)  Leg bump: new problem -follow-up with PCP, suspect lipoma  Call the clinic if this treatment plan is not working well for you  Follow up in the clinic in 6 months, sooner if needed.    Control of Dust Mite Allergen Dust mites play a major role in allergic  asthma and rhinitis. They occur in environments with high humidity wherever human skin is found. Dust mites absorb humidity from the atmosphere (ie, they do not drink) and feed on organic matter (including shed human and animal skin). Dust mites are a microscopic type of insect that you cannot see with the naked eye. High levels of dust mites have been detected from mattresses, pillows, carpets, upholstered furniture, bed covers, clothes, soft toys and any woven material. The principal allergen of the dust mite is found in its feces. A gram of dust may contain 1,000 mites and 250,000 fecal particles. Mite antigen is easily measured in the air during house cleaning activities. Dust mites do not bite and do not cause harm to humans, other than by triggering allergies/asthma.  Ways to decrease your exposure to dust mites in your home:  1. Encase mattresses, box springs and pillows with a mite-impermeable barrier or cover  2. Wash sheets, blankets and drapes weekly in hot water (130 F) with detergent and dry them in a dryer on the hot setting.  3. Have the room cleaned frequently with a vacuum cleaner and a damp dust-mop. For carpeting or rugs, vacuuming with a vacuum cleaner equipped with a high-efficiency particulate air (HEPA) filter. The dust mite allergic individual should not be in a room which is being cleaned and should wait 1 hour after cleaning before going into the room.  4. Do not sleep on upholstered furniture (eg, couches).  5. If  possible removing carpeting, upholstered furniture and drapery from the home is ideal. Horizontal blinds should be eliminated in the rooms where the person spends the most time (bedroom, study, television room). Washable vinyl, roller-type shades are optimal.  6. Remove all non-washable stuffed toys from the bedroom. Wash stuffed toys weekly like sheets and blankets above.  7. Reduce indoor humidity to less than 50%. Inexpensive humidity monitors can be purchased  at most hardware stores. Do not use a humidifier as can make the problem worse and are not recommended.  Control of Mold Allergen Mold and fungi can grow on a variety of surfaces provided certain temperature and moisture conditions exist.  Outdoor molds grow on plants, decaying vegetation and soil.  The major outdoor mold, Alternaria and Cladosporium, are found in very high numbers during hot and dry conditions.  Generally, a late Summer - Fall peak is seen for common outdoor fungal spores.  Rain will temporarily lower outdoor mold spore count, but counts rise rapidly when the rainy period ends.  The most important indoor molds are Aspergillus and Penicillium.  Dark, humid and poorly ventilated basements are ideal sites for mold growth.  The next most common sites of mold growth are the bathroom and the kitchen.  Outdoor [employer] Use air conditioning and keep windows closed Avoid exposure to decaying vegetation. Avoid leaf raking. Avoid grain handling. Consider wearing a face mask if working in moldy areas.  Indoor Mold Control Maintain humidity below 50%. Clean washable surfaces with 5% bleach solution. Remove sources e.g. Contaminated carpets.

## 2024-02-18 ENCOUNTER — Other Ambulatory Visit: Payer: Self-pay | Admitting: Family

## 2024-02-18 DIAGNOSIS — F411 Generalized anxiety disorder: Secondary | ICD-10-CM

## 2024-02-19 ENCOUNTER — Ambulatory Visit: Payer: BC Managed Care – PPO

## 2024-02-20 DIAGNOSIS — L820 Inflamed seborrheic keratosis: Secondary | ICD-10-CM | POA: Diagnosis not present

## 2024-02-20 DIAGNOSIS — D225 Melanocytic nevi of trunk: Secondary | ICD-10-CM | POA: Diagnosis not present

## 2024-02-20 DIAGNOSIS — Z1283 Encounter for screening for malignant neoplasm of skin: Secondary | ICD-10-CM | POA: Diagnosis not present

## 2024-02-27 NOTE — Progress Notes (Signed)
 02/28/2024 Name: Carla Morales MRN: [id number] DOB: [birth year]  Chief Complaint  Patient presents with   Diabetes    Carla Morales is a 63 y.o. year old female who was referred for medication management by their primary care provider, Yevette Hem, FNP. They presented for a face to face visit today.   They were referred to the pharmacist by their PCP for assistance in managing diabetes (new diagnosis in March 2025)   Subjective:  Care Team: Primary Care Provider: Yevette Hem, FNP    Medication Access/Adherence  Current Pharmacy:  [facility] - [address], [place] - [address] [place] [address] Phone: [phone] Fax: [phone]  [id number] - [place], [place] - [address] [place] [address] Phone: [phone] Fax: [phone]  [facility] - [place], [place] - [address] [facility] [address] [place] [address] Phone: [phone] Fax: [phone]   Patient reports affordability concerns with their medications: No  Patient reports access/transportation concerns to their pharmacy: No  Patient reports adherence concerns with their medications:  No     Diabetes:  Current medications: metformin  IR -->metforminXR Medications tried in the past:  new diagnosis 01/2024  Current glucose readings: n/a, not testing Will send in one touch verio glucometer; appears preferred on patietn's insurance   Current meal patterns:  Discussed meal planning options and Plate method for healthy eating Avoid sugary drinks and desserts Incorporate balanced protein, non starchy veggies, 1 serving of carbohydrate with each meal Increase water intake Increase physical activity as able  Current physical activity: encouraged as able  Current medication access support: BC[employer]/express scripts mail order   Objective:  Lab Results  Component Value Date   HGBA1C  7.2 (H) 01/24/2024    Lab Results  Component Value Date   CREATININE 0.86 01/24/2024   BUN 14 01/24/2024   NA 143 01/24/2024   K 4.3 01/24/2024   CL 103 01/24/2024   CO2 22 01/24/2024    Lab Results  Component Value Date   CHOL 159 07/24/2023   HDL 54 07/24/2023   LDLCALC 80 07/24/2023   TRIG 143 07/24/2023   CHOLHDL 2.9 07/24/2023    Medications Reviewed Today     Reviewed by Delilah Fend, [facility] (Pharmacist) on 02/28/24 at 1448  Med List Status: <None>   Medication Order Taking? Sig Documenting Provider Last Dose Status Informant  albuterol  (VENTOLIN  HFA) 108 (90 Base) MCG/ACT inhaler [id number] No USE 2 INHALATIONS EVERY 6 HOURS AS NEEDED FOR WHEEZING OR SHORTNESS OF BREATH Need office visit for further refills Yevette Hem, FNP Taking Active   atorvastatin  (LIPITOR) 10 MG tablet [id number] No Take 1 tablet (10 mg total) by mouth daily. Yevette Hem, FNP Taking Active   azelastine  (ASTELIN ) 0.1 % nasal spray [id number]  Place 2 sprays into both nostrils 2 (two) times daily as needed for rhinitis. Use in each nostril as directed Sean Czar, MD  Active   baclofen  (LIORESAL ) 10 MG tablet [id number] No Take 1 tablet (10 mg total) by mouth 3 (three) times daily. Yevette Hem, FNP Taking Active   Bepotastine  Besilate 1.5 % SOLN [id number]  Place 1 drop into both eyes 2 (two) times daily as needed. Sean Czar, MD  Active   Blood Glucose Monitoring Suppl [facility] VERIO FLEX SYSTEM) w/Device KIT [id number] Yes Use as directed to check glucose. Yevette Hem, FNP  Active  dexlansoprazole  (DEXILANT ) 60 MG capsule [id number] No TAKE 1 CAPSULE DAILY Pyrtle, Amber Bail, MD Taking Active   Dupilumab  (DUPIXENT ) 300 MG/2ML SOPN [id number] No Inject 1 pen (300 mg total) into the skin once a week. Nannette Babe, MD Taking Active   escitalopram  (LEXAPRO ) 10 MG tablet [id number] No Take 1 tablet (10 mg total) by mouth daily. Yevette Hem, FNP Taking Active   escitalopram  (LEXAPRO ) 5  MG tablet [id number]  TAKE 1 TABLET BY MOUTH DAILY FOR 14 DAYS, THEN 2 TABLETS (10 MG TOTAL) DAILY FOR 14 DAYS. Tommas Fragmin A, FNP  Active   fluticasone  (FLONASE ) 50 MCG/ACT nasal spray [id number]  Place 2 sprays into both nostrils daily. Sean Czar, MD  Active   glucose blood test strip [id number] Yes Please fill one touch verio test strips. Use as instructed to test blood sugar daily. DX: E11.65 Yevette Hem, FNP  Active   hydrOXYzine  (ATARAX ) 25 MG tablet [id number] No Take 1-2 tablets nightly as needed for itching. Sean Czar, MD Taking Active   Ibuprofen (ADVIL PO) [id number] No Take by mouth. [provider] Taking Active   ipratropium (ATROVENT ) 0.03 % nasal spray [id number]  1-2 sprays up to two times daily for drainage. Sean Czar, MD  Active   levocetirizine (XYZAL) 5 MG tablet [id number] No Take 5 mg by mouth in the morning. [provider] Taking Active   lidocaine  (XYLOCAINE ) 2 % solution [id number] No Use as directed 15 mLs in the mouth or throat every 6 (six) hours as needed for mouth pain. Ardie Kras, FNP Taking Active   magnesium gluconate (MAGONATE) 500 MG tablet [id number] No Take 500 mg by mouth daily. [provider] Taking Active   meloxicam  (MOBIC ) 15 MG tablet [id number] No Take 1 tablet (15 mg total) by mouth daily. Yevette Hem, FNP Taking Active   Discontinued 02/28/24 1423 (Change in therapy)   metFORMIN  (GLUCOPHAGE -XR) 500 MG 24 hr tablet [id number] Yes Take 1 tablet (500 mg total) by mouth 2 (two) times daily with a meal. Hawks, Christy A, FNP  Active   mometasone -formoterol  (DULERA ) 200-5 MCG/ACT AERO [id number]  Inhale 2 puffs into the lungs 2 (two) times daily. Sean Czar, MD  Active   nitroGLYCERIN  (NITROSTAT ) 0.4 MG SL tablet [id number] No Place 0.4 mg under the tongue every 5 (five) minutes as needed. [provider] Taking Active            Med Note (DAVIS, SOPHIA A   Wed Jul 18, 2023  9:29 AM) On hand   Olopatadine HCl (PATADAY OP) [id number] No Apply to eye. [provider] Taking Active   OneTouch Delica Lancets 33G MISC [id number] Yes Use as directed to check glucose daily Yevette Hem, FNP  Active   VITAMIN E PO [id number] No Take 800 Int'l Units/day by mouth daily. [provider] Taking Active               Assessment/Plan:   Diabetes: - Currently uncontrolled, improving on diet and metformin  - Reviewed long term cardiovascular and renal outcomes of uncontrolled blood sugar - Reviewed goal A1c, goal fasting, and goal 2 hour post prandial glucose - Reviewed dietary modifications including FOLLOWING A HEART HEALTHY DIET/HEALTHY PLATE METHOD (educational materials provided) - Reviewed lifestyle modifications including:  increased physical activity - Recommend to change metformin  IR to XR formulation (better tolerated) One touch verio meter called in for patient to test 3 tiems weekly in the AM  Follow Up Plan: PCP next week   Marvell Slider, PharmD, BCACP, CPP Clinical Pharmacist, [facility]

## 2024-02-28 ENCOUNTER — Ambulatory Visit (INDEPENDENT_AMBULATORY_CARE_PROVIDER_SITE_OTHER): Admitting: Pharmacist

## 2024-02-28 DIAGNOSIS — E119 Type 2 diabetes mellitus without complications: Secondary | ICD-10-CM | POA: Diagnosis not present

## 2024-02-28 DIAGNOSIS — R600 Localized edema: Secondary | ICD-10-CM | POA: Diagnosis not present

## 2024-02-28 DIAGNOSIS — M79662 Pain in left lower leg: Secondary | ICD-10-CM | POA: Diagnosis not present

## 2024-02-28 DIAGNOSIS — R252 Cramp and spasm: Secondary | ICD-10-CM | POA: Diagnosis not present

## 2024-02-28 DIAGNOSIS — I83893 Varicose veins of bilateral lower extremities with other complications: Secondary | ICD-10-CM | POA: Diagnosis not present

## 2024-02-28 DIAGNOSIS — Z7984 Long term (current) use of oral hypoglycemic drugs: Secondary | ICD-10-CM | POA: Diagnosis not present

## 2024-02-28 DIAGNOSIS — M79661 Pain in right lower leg: Secondary | ICD-10-CM | POA: Diagnosis not present

## 2024-02-28 MED ORDER — ONETOUCH VERIO FLEX SYSTEM W/DEVICE KIT: PACK | 1 refills | Status: DC

## 2024-02-28 MED ORDER — GLUCOSE BLOOD VI STRP: ORAL_STRIP | 12 refills | Status: DC

## 2024-02-28 MED ORDER — ONETOUCH DELICA LANCETS 33G MISC: 5 refills | Status: DC

## 2024-02-28 MED ORDER — METFORMIN HCL ER 500 MG PO TB24
500.0000 mg | ORAL_TABLET | Freq: Two times a day (BID) | ORAL | 5 refills | Status: DC
Start: 2024-02-28 — End: 2024-05-26

## 2024-03-06 ENCOUNTER — Ambulatory Visit: Admitting: Family

## 2024-03-06 ENCOUNTER — Encounter (INDEPENDENT_AMBULATORY_CARE_PROVIDER_SITE_OTHER): Payer: Self-pay | Admitting: Otolaryngology

## 2024-03-06 ENCOUNTER — Ambulatory Visit (INDEPENDENT_AMBULATORY_CARE_PROVIDER_SITE_OTHER)

## 2024-03-06 ENCOUNTER — Encounter: Payer: Self-pay | Admitting: Family

## 2024-03-06 ENCOUNTER — Ambulatory Visit (INDEPENDENT_AMBULATORY_CARE_PROVIDER_SITE_OTHER): Payer: BC Managed Care – PPO | Admitting: Otolaryngology

## 2024-03-06 VITALS — BP 125/70 | HR 66 | Temp 97.5°F | Ht 65.0 in | Wt 195.4 lb

## 2024-03-06 VITALS — BP 135/78 | HR 65

## 2024-03-06 DIAGNOSIS — R0982 Postnasal drip: Secondary | ICD-10-CM | POA: Diagnosis not present

## 2024-03-06 DIAGNOSIS — K200 Eosinophilic esophagitis: Secondary | ICD-10-CM

## 2024-03-06 DIAGNOSIS — Z7984 Long term (current) use of oral hypoglycemic drugs: Secondary | ICD-10-CM | POA: Diagnosis not present

## 2024-03-06 DIAGNOSIS — F411 Generalized anxiety disorder: Secondary | ICD-10-CM

## 2024-03-06 DIAGNOSIS — K219 Gastro-esophageal reflux disease without esophagitis: Secondary | ICD-10-CM

## 2024-03-06 DIAGNOSIS — Z780 Asymptomatic menopausal state: Secondary | ICD-10-CM

## 2024-03-06 DIAGNOSIS — J383 Other diseases of vocal cords: Secondary | ICD-10-CM

## 2024-03-06 DIAGNOSIS — R490 Dysphonia: Secondary | ICD-10-CM

## 2024-03-06 DIAGNOSIS — E119 Type 2 diabetes mellitus without complications: Secondary | ICD-10-CM

## 2024-03-06 DIAGNOSIS — J3089 Other allergic rhinitis: Secondary | ICD-10-CM | POA: Diagnosis not present

## 2024-03-06 DIAGNOSIS — M8589 Other specified disorders of bone density and structure, multiple sites: Secondary | ICD-10-CM | POA: Diagnosis not present

## 2024-03-06 MED ORDER — SEMAGLUTIDE(0.25 OR 0.5MG/DOS) 2 MG/3ML ~~LOC~~ SOPN: 0.2500 mg | PEN_INJECTOR | SUBCUTANEOUS | 1 refills | Status: DC

## 2024-03-06 MED ORDER — LANCET DEVICE MISC: 1.0000 | Freq: Three times a day (TID) | 0 refills | Status: AC

## 2024-03-06 MED ORDER — BLOOD GLUCOSE MONITORING SUPPL DEVI: 1.0000 | Freq: Three times a day (TID) | 0 refills | Status: DC

## 2024-03-06 MED ORDER — BLOOD GLUCOSE TEST VI STRP: 1.0000 | ORAL_STRIP | Freq: Three times a day (TID) | 0 refills | Status: AC

## 2024-03-06 MED ORDER — ESCITALOPRAM OXALATE 20 MG PO TABS: 20.0000 mg | ORAL_TABLET | Freq: Every day | ORAL | 1 refills | Status: DC

## 2024-03-06 MED ORDER — LANCETS MISC. MISC: 1.0000 | Freq: Three times a day (TID) | 0 refills | Status: AC

## 2024-03-06 NOTE — Patient Instructions (Signed)
 HYALURONIC ACID (RESTYLANE) INJECTION TO THE VOCAL CORD What is Hyaluronic Acid? Hyaluronic Acid is a synthetic substance. It contains a substance that is found in our subcutaneous tissue, or the soft tissue just beneath our skin. It has also been used in plastic surgery to fill in wrinkles or plump lips.    Hyaluronic Acid and the Vocal Cords Vocal cord problems are usually due to a lack of movement, bowing (loss of muscle) or weakness of the vocal cords. This lack of motion makes speech difficult. A lack of vocal cord motion can also cause:  Low, raspy or rough voice  Constant hoarseness  Breathing difficulty  Inability to speak above a whisper  Coughing or choking when swallowing Hyaluronic Acid increases the size or mass of the vocal cord where muscle loss has occurred. It acts like a space filler. Hyaluronic Acid is injected next to the vocal cords and into the muscles supporting the vocal cords. The body slowly reabsorbs it over time, and its effects usually last about 4-6 months, although this range varies for each patient.  Hyaluronic Acid Treatment You will be awake during the treatment and able to talk and breathe normally. Numbing medicines are used before the procedure to decrease the discomfort. The treatment will take about 5-10 minutes.    You should not eat a big meal before the injection.  In the office: You will sit up in a chair for the injection. A fiberoptic camera called a nasoendoscope is placed in a nostril and will go down to the base of your throat. This will allow the doctor to see your vocal cords. An injection of Hyaluronic Acid can be given in several different ways, including via your mouth, directly into the surface of the vocal cord, or through the skin near your "Adam's apple" on your throat. If it is given through the skin, the lidocaine  is injected into the skin to numb it before the injection.  If it is given via the mouth, the lining of the throat is numbed  with liquid lidocaine  first. We often offer a dose of valium to be taken before the procedure if you have someone who can drive you home afterward. Call the office if you would like this prescribed for you, then pick it up at your pharmacy and take it 1 hour before the procedure. Many patients choose not to take the valium, and they are able to drive themselves to and from the appointment.  In the operating room: You will be asleep under general anesthesia, with a breathing tube in. The procedure takes approximately 10 minutes and is done in a hospital or surgery center.  Risks include (but are not limited to) swelling, infection or allergic reaction, pain, extravasation into a more superficial layer of the vocal cord (making the voice more strained), inflammation, or failure to improve the voice. Rarely, additional procedures are needed to address these complications.  The voice is usually strained for a few days, then settles in by 4-7 days. Some patients may sound very good after the injection but find that their voice fades quickly, much earlier than the usual 4-6 months. These patients may benefit from a second injection right away, and they should call the office.  If you have any trouble breathing or severe pain with swallowing or talking, you should call the office right away. It is not unusual to have a sore throat for a few days afterward, but if this soreness or pain is severe, you should call  the office. You should always call the office for any questions or issues.  After a Hyaluronic Acid Treatment  You can talk right after your treatment.  If you had the procedure done in the office, you should not eat or drink for 1 hour, or until the sensation in your throat completely returns to normal  You may have some discomfort or pain that radiates from your throat to your ear.  You will follow up with your doctor as instructed.  The Hyaluronic Acid procedure results can last from 3-6 months, but  this is different for every patient.     [email or web address] - check out this website to learn more about reflux   -Avoid lying down for at least two hours after a meal or after drinking acidic beverages, like soda, or other caffeinated beverages. This can help to prevent stomach contents from flowing back into the esophagus. -Keep your head elevated while you sleep. Using an extra pillow or two can also help to prevent reflux. -Eat smaller and more frequent meals each day instead of a few large meals. This promotes digestion and can aid in preventing heartburn. -Wear loose-fitting clothes to ease pressure on the stomach, which can worsen heartburn and reflux. -Reduce excess weight around the midsection. This can ease pressure on the stomach. Such pressure can force some stomach contents back up the esophagus

## 2024-03-06 NOTE — Patient Instructions (Signed)

## 2024-03-06 NOTE — Progress Notes (Signed)
 ENT Progress Note:   Update 03/06/2024  Discussed the use of AI scribe software for clinical note transcription with the patient, who gave verbal consent to proceed.  History of Present Illness Carla Morales is a 63 year old female with hx of chronic dysphonia, GERD, environmental allergies and post-nasal drainage, asthma, who presents for follow-up after voice therapy and reflux management.  She has been attending sessions with a speech therapist, which have been somewhat beneficial, especially during exercises and home practice. However, she experiences regression in progress after speaking for extended periods, such as an hour.  She is currently taking Dexilant  for reflux and using Reflux Gourmet. For postnasal drainage, she is on Dupixent , Dulera , Flonase , Astelin , and ipratropium bromide . She estimates a 50% improvement in her symptoms with these treatments.  Her voice quality is clearer in the late morning, while early mornings are less favorable. She uses her inhaler in the morning.  She continues to experience postnasal drainage and reflux changes, which have been persistent issues.   She is not on any blood thinners but takes vitamin E and omega-3 supplements.   Records Reviewed:  Last OV  Discussed the use of AI scribe software for clinical note transcription with the patient, who gave verbal consent to proceed.  History of Present Illness   Carla Morales is a 63 year old female with hx of GERD, eosinophilic esophagitis/dysphonia who presents for follow-up after voice therapy.  She is undergoing voice therapy, initially set up for six weeks with sessions twice a week for four weeks and once a week thereafter. She has not been able to attend twice weekly sessions consistently. She experiences improvement during the sessions, although the benefits do not persist.  She experiences nasal congestion and post-nasal drainage that settles in her throat. She is currently  taking Dexilant  for reflux but has not started the reflux gourmet supplement discussed previously. She continues to use Dupixent , Dulera , Flonase , Astelin , and ipratropium bromide  (Atrovent ).  She has a history of eosinophilic esophagitis and has been managing her symptoms by decreasing certain foods, increasing water intake, and reducing caffeine consumption to low levels in the morning.  She experiences occasional ulcers on the roof of her mouth that resolve spontaneously. She has started using Vaseline and saline sprays to help with dryness in her nose, especially since the heating is on and the air is dry.  She is using a humidifier at home and work to help with her symptoms.   Initial Evaluation  Reason for Consult: ulcer on the roof of her mouth   HPI: Discussed the use of AI scribe software for clinical note transcription with the patient, who gave verbal consent to proceed.  History of Present Illness   The patient is a 66 yoF, with a history of asthma, gastroesophageal reflux disease (GERD), and eosinophilic esophagitis (EOE), presents with a chief complaint of dysphonia/voice changes, throat discomfort an a painful mouth ulcer she developed over the roof of her mouth, which has since resolved. The mouth ulcer was located on the roof of the mouth and was extremely sensitive to acidic and spicy foods. The patient also reports a persistent hoarseness for the past year and a half, which predates the initiation of Dupixent  for EOE.  The patient has been on multiple medications, including Dupixent , Dulera , Flonase , and Astelin , for the management of asthma/environmental allergies and EOE. They have had recurrent episodes of thrush, which have been more frequent before the initiation of Dulera . The patient also reports  a history of non-stop coughing, which has been managed with prednisone  and was thought to be related to uncontrolled asthma.  The patient also reports pain in the back of the  throat, particularly when talking a lot or attempting to sing. This pain is described as different from a typical sore throat and does not seem to be related to postnasal drainage. The patient also reports sensitivity on the edges of the tongue to acidic foods.  The patient has been diagnosed with EOE and GERD, and is currently on Dexilant  for the management of these conditions. They have undergone an upper endoscopy twice, with the most recent one showing no significant changes. The patient has not made any significant dietary changes, but has reduced the intake of soft drinks. She never tried elimination diet for EoE.   The patient's asthma is currently under control with Dulera , and they report no shortness of breath or trouble swallowing since starting this medication. They have been diagnosed with allergies to dust mites and mold, and are currently on Xyzal for this. The patient uses albuterol  infrequently, only when they get sick. They have learned to manage their breathlessness by stopping and focusing on their breathing.    Asthma and Allergy  Office Visit  Chief Complaint: Allergies (Hoarse and red painful spot on roof of mouth that she noticed on Saturday )   HPI Carla Morales is a 63 year old female who presents to the clinic for evaluation of a red spot in her mouth.  She was last seen in this clinic on 08/13/2023 by Dr. Cornel Diesel for evaluation of asthma, allergic rhinitis, allergic conjunctivitis, pruritus, and EOE on Dupixent  for which she follows up with her GI specialist.     In the interim, she reports that her building is undergoing mold remediation procedures including chemical treatments which she reports has flared her asthma.  About the same time, she noted that the top of her mouth felt sore (Saturday).  Using a flashlight she looked at the top of her mouth and noted a small red area.  She reports this area grew in size over the net few days and became more painful each day.   She reports that she is currently unable to eat solid foods and is eating pure or drinking liquids at this time.  She does not have a history of drinking alcohol or using nicotine products.  She denies recent fever, sweats, chills, or sick contacts.     Asthma is reported as moderately well-controlled with recent symptoms including chest tightness, shortness of breath, and cough producing clear mucus.  She continues Dulera  200-2 puffs twice a day with a spacer and rarely needs to use albuterol  for relief of symptoms.  She does report that she used albuterol  several times last week for relief of asthma symptoms.     Allergic rhinitis is reported as moderately well-controlled with nasal congestion Anne headache that occurred last week.  More constant symptoms include occasional sneezing and postnasal drainage.  She continues Xyzol 5 mg once a day, Flonase  daily, ipratropium daily, and azelastine  daily.  She frequently uses nasal saline rinses which she reports are very effective.  Her last environmental allergy  skin testing was on 03/28/2023 and was positive to dust mite and mold.    Allergic conjunctivitis is reported as moderately well-controlled with occasional itching and redness occurring only last week.  She continues olopatadine with relief of symptoms.    She continues to follow up with her GI specialist for treatment of  EOE.  She continues Dupixent  weekly with excellent relief of EOE symptoms.  She is not currently avoiding any foods.  Her last selected food allergy  skin testing was on 03/28/2023 and was negative to foods tested.   She reports occasional pruritus for which she continues Xyzol in the evening and rarely uses hydroxyzine  for relief of symptoms.   Her current medications are listed in the chart.     Past Medical History:  Diagnosis Date   Adenomatous colon polyp    Allergy     Arthritis    Asthma    Chondrocalcinosis    COVID-19    Diverticulosis    Elevated alkaline  phosphatase level    Elevated antinuclear antibody (ANA) level    Eosinophilic esophagitis    Esophageal dysmotility    Esophageal stenosis    Fatty liver    GERD (gastroesophageal reflux disease)    Hyperlipidemia    Internal hemorrhoids    Migraine    Osteoarthritis    Pinched nerve in neck    Schatzki's ring    Sinus congestion    Urinary incontinence     Past Surgical History:  Procedure Laterality Date   ABDOMINAL HYSTERECTOMY     bladder dialation  1968   as a child   COLONOSCOPY     TONSILLECTOMY  1971   TOTAL VAGINAL HYSTERECTOMY  07/2010   menorrhagia, leiomyoma, prolapse   TUBAL LIGATION  1998    Family History  Problem Relation Age of Onset   Allergic rhinitis Mother    Asthma Mother    Rheum arthritis Mother    Fibromyalgia Mother    Cancer Mother        breast, uterine, adenocarcinoma-right lung   Uterine cancer Mother        Sarcoma   Lung cancer Mother        Adenocarcinoma   Breast cancer Mother 52   Hypertension Father    Melanoma Father 71   Asthma Sister    Asthma Maternal Aunt    Diabetes Maternal Aunt    Cancer Maternal Aunt 37       GYN cancer - possibly vulvar?   Lung cancer Maternal Aunt    Diabetes Maternal Uncle    Appendicitis Maternal Uncle        d. 12   Diabetes Paternal Aunt    Breast cancer Paternal Aunt 13   Breast cancer Paternal Aunt 72   Uterine cancer Paternal Aunt 28   Breast cancer Paternal Aunt 18   Diabetes Paternal Uncle    Other Maternal Grandmother 30       childbirth   Heart disease Maternal Grandfather    Cancer Paternal Grandmother        unknown   Other Daughter        Phyllodes tumor   Other Nephew        Gilbert's Disease   Colon cancer Neg Hx    Esophageal cancer Neg Hx    Ulcerative colitis Neg Hx    Stomach cancer Neg Hx    Pancreatic cancer Neg Hx     Social History:  reports that she has never smoked. She has never been exposed to tobacco smoke. She has never used smokeless tobacco. She  reports that she does not drink alcohol and does not use drugs.  Allergies:  Allergies  Allergen Reactions   Other Swelling    Contrast dye   Contrast Media [Iodinated Contrast Media]    Dust Mite Extract  Haldol [Haloperidol Decanoate]     anxiety   Molds & Smuts    Pravastatin  Other (See Comments)    myalgias   Sulfa Antibiotics Rash    Medications: I have reviewed the patient's current medications.  The PMH, PSH, Medications, Allergies, and SH were reviewed and updated.  ROS: Constitutional: Negative for fever, weight loss and weight gain. Cardiovascular: Negative for chest pain and dyspnea on exertion. Respiratory: Is not experiencing shortness of breath at rest. Gastrointestinal: Negative for nausea and vomiting. Neurological: Negative for headaches. Psychiatric: The patient is not nervous/anxious  Blood pressure 135/78, pulse 65, last menstrual period 07/24/2010, SpO2 99%.  PHYSICAL EXAM:  Exam: Blood pressure 135/78, pulse 65, last menstrual period 07/24/2010, SpO2 99%. There is no height or weight on file to calculate BMI.  PHYSICAL EXAM:  Exam: General: Well-developed, well-nourished Communication and Voice: Clear pitch and clarity Respiratory Respiratory effort: Equal inspiration and expiration without stridor Cardiovascular Peripheral Vascular: Warm extremities with equal color/perfusion Eyes: No nystagmus with equal extraocular motion bilaterally Neuro/Psych/Balance: Patient oriented to person, place, and time; Appropriate mood and affect; Gait is intact with no imbalance; Cranial nerves I-XII are intact Head and Face Inspection: Normocephalic and atraumatic without mass or lesion Palpation: Facial skeleton intact without bony stepoffs Salivary Glands: No mass or tenderness Facial Strength: Facial motility symmetric and full bilaterally ENT Pinna: External ear intact and fully developed External canal: Canal is patent with intact skin Tympanic  Membrane: Clear and mobile External Nose: No scar or anatomic deformity Internal Nose: Septum is relatively straight. No polyp, or purulence. Mucosal edema and erythema present.  Bilateral inferior turbinate hypertrophy.  Lips, Teeth, and gums: Mucosa and teeth intact and viable TMJ: No pain to palpation with full mobility Oral cavity/oropharynx: No erythema or exudate, no lesions present Nasopharynx: No mass or lesion with intact mucosa Hypopharynx: Intact mucosa without pooling of secretions Larynx Glottic: Full true vocal cord mobility without lesion or mass VF atrophy and glottic insufficiency supraglottic compression with phonation Supraglottic: Normal appearing epiglottis and AE folds Interarytenoid Space: Moderate to severe pachydermia&edema Subglottic Space: Patent without lesion or edema Neck Neck and Trachea: Midline trachea without mass or lesion Thyroid : No mass or nodularity Lymphatics: No lymphadenopathy  Preoperative diagnosis: dysphonia hx of VF atrophy and GERD LPR  Postoperative diagnosis:   Same  Procedure: Flexible fiberoptic laryngoscopy  Surgeon: Artice Last, MD  Anesthesia: Topical lidocaine  and Afrin Complications: None Condition is stable throughout exam  Indications and consent:  The patient presents to the clinic with above symptoms. Indirect laryngoscopy view was incomplete. Thus it was recommended that they undergo a flexible fiberoptic laryngoscopy. All of the risks, benefits, and potential complications were reviewed with the patient preoperatively and verbal informed consent was obtained.  Procedure: The patient was seated upright in the clinic. Topical lidocaine  and Afrin were applied to the nasal cavity. After adequate anesthesia had occurred, I then proceeded to pass the flexible telescope into the nasal cavity. The nasal cavity was patent without rhinorrhea or polyp. The nasopharynx was also patent without mass or lesion. The base of tongue  was visualized and was normal. There were no signs of pooling of secretions in the piriform sinuses. The true vocal folds were mobile bilaterally. There were no signs of glottic or supraglottic mucosal lesion or mass. There was moderate to severe interarytenoid pachydermia and post cricoid edema. The telescope was then slowly withdrawn and the patient tolerated the procedure throughout.   Studies Reviewed: CXR 04/21/23 FINDINGS: The heart  size and mediastinal contours are within normal limits. Both lungs are clear. The visualized skeletal structures are unremarkable.   IMPRESSION: No active cardiopulmonary disease  Esophagram 11/02/23EXAM: ESOPHAGUS/BARIUM SWALLOW/TABLET STUDY   TECHNIQUE: A combined double and single contrast examination was performed using effervescent crystals, high-density barium, and thin liquid barium.   FLUOROSCOPY: Fluoroscopy time: 2 minutes, 30 seconds (22.1 mGy).   COMPARISON:  No pertinent prior exams available for comparison.   FINDINGS: Fluoroscopic evaluation demonstrates normal caliber and smooth contour of the esophagus. No evidence of fixed stricture, mass or mucosal abnormality.   Mild intermittent esophageal dysmotility with tertiary contractions.   No appreciable hiatal hernia.   No gastroesophageal reflux was observed.   The patient swallowed a 13 mm barium tablet, which freely passed into the stomach.   The exam was performed by Jetta Morrow, NP , and was supervised and interpreted by Dr. Bascom Lily.   IMPRESSION: Mild esophageal dysmotility.   Otherwise unremarkable esophagram, as described.      Assessment/Plan: Encounter Diagnoses  Name Primary?   Glottic insufficiency Yes   Dysphonia    Age-related vocal fold atrophy    Chronic GERD    Post-nasal drip    Eosinophilic esophagitis    Environmental and seasonal allergies       Assessment and Plan    Chronic dysphonia and Voice Fatigue Recurrent hoarseness  and throat pain, exacerbated by talking and singing. Vocal folds appear thin with incomplete closure and mild edema on videostrobe today. No evidence of tumor or mass/no lesions mucosal wave intact. She had significant supraglottic compression and I believe there is an element of muscle tension which might cause throat discomfort/voice fatigue over time.  Discussed voice therapy as a first-line treatment to optimize breath support and reduce symptoms. Potential future interventions include vocal fold filler injection augmentation vs trial of Botox if voice therapy is ineffective and repeat exam will be c/w laryngeal spasm.  - Refer to voice therapy - Optimize reflux control - Continue current management of postnasal drainage - Consider elimination diet for EoE  - trial of Reflux Gourmet  Gastroesophageal Reflux Disease (GERD) GERD managed with Dexilant . Symptoms include throat pain and possible contribution to voice changes. Discussed optimizing reflux control through dietary adjustments and natural supplements. - Continue Dexilant  at current dose - Optimize reflux control through dietary adjustments and trial of reflux gourmet  Eosinophilic Esophagitis (EOE) EOE managed with Dupixent  and Dexilant . No significant improvement noted on recent endoscopy per report. No formal elimination diet in the past. Discussed the six-food elimination diet to identify specific triggers and improve symptoms. - Provide information on six-food elimination diet - Encourage dietary adjustments to identify specific triggers  Asthma and Environmental Allergies Post-nasal drainage Nasal endoscopy today with S-shaped septum and septal deviation but no evidence of polyps or no pus to suggest sinusitis  Asthma with episodes of uncontrolled symptoms requiring increased medication. Currently on Dupixent , Dulera , Flonase /Asteline/Ipratropium Bromide . Discussed the importance of rinsing mouth after using inhalers to prevent  thrush. - Continue current asthma medications (Dupixent , Dulera ) - Monitor for symptom control and adjust medications as needed - continue Flonase /Asteline/Ipratropium Bromide  daily 2 puffs b/l nares  - continue Xyzal 5 mg daily   Recurrent Thrush - no evidence of thrush on exam today Recurrent thrush associated with steroid use for asthma. No current evidence of thrush on examination. Discussed the importance of rinsing mouth after using inhalers to prevent recurrence. 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 No current ulceration or significant symptoms noted. Discussed monitoring for any changes or recurrence of symptoms and potential biopsy if ulceration recurs. - Monitor for any changes or recurrence of symptoms - Consider biopsy if ulceration recurs  Follow-up - Schedule follow-up appointment after voice therapy - Provide handout on elimination diet.      Update 03/06/2024 Assessment and Plan    Chronic dysphonia and VF atrophy/glottic insufficiency on scope exam during initial evaluation  Undergoing voice therapy with noted improvement, but still has raspy voice at times. Persistent nasal congestion and postnasal drainage contributing to symptoms Discussed benefits of Reflux Gourmet in addition to Dexilant  to prevent reflux, especially when lying down. Reflux Diet.  - Continue voice therapy - Use Reflux Gourmet supplement after meals, especially after dinner - continue Dexilant  at current dose - Follow up in two months for re-evaluation and repeat scope exam  - will consider injection augmentation when she returns if voice is not better  Chronic nasal congestion and post-nasal drainage hx of asthma Using multiple medications including Dupixent , Dulera , Flonase , Astelin , and  ipratropium bromide . Discussed use of Vaseline and saline sprays to alleviate dryness. - Continue current medications (Dupixent , Dulera , Flonase , Astelin , ipratropium bromide ) - Use Vaseline and saline sprays - Use humidifier at home and work  Eosinophilic Esophagitis Attempting elimination diet, reducing caffeine intake, and increasing water consumption. Noted improvement in symptoms with dietary changes. - Continue elimination diet - Continue reducing caffeine intake - Maintain increased water consumption - will consider GI referral for repeat EGD   Follow-up - Schedule follow-up appointment in two months for reassessment and repeat strobe exam   Update 03/06/2024  Assessment & Plan Dysphonia, chronic, Vocal fold atrophy and glottic insufficiency Vocal fold atrophy with thinning of the VFs and supraglottic compression observed, contributing to voice changes and potential supraglottic compression. Discussed hyaluronic acid filler injection to improve vocal fold closure and sound projection. Explained procedure, including topicalized lidocaine  and steps of the procedure. Potential side effects include initial raspiness and sore throat. Expected outcomes include improved sound projection and closure, lasting 6-12 months. Alternatives include continuing current therapy and focusing on voice exercises. - Provided detailed description and Q&A about the filler injection procedure in the after visit summary. - Advised her to schedule the procedure at checkout or call back when ready. - Recommend holding supplements that increase risk of bleeding for a few days prior to procedure to minimize bleeding risk..  Chronic nasal congestion Environmental Allergies Postnasal drip Chronic postnasal drip with ongoing symptoms despite current treatment. Improvement noted at approximately 50% with current therapy. - Continue current medications: Dupixent , Dulera , Flonase , Astelin , ipratropium  bromide.  Gastroesophageal reflux disease LPR Chronic gastroesophageal reflux disease with ongoing symptoms. Reflux changes observed during scope exam.. Discussed importance of continued reflux management to prevent further vocal irritation and raspiness. - Continue current medications: Dexilant  and Reflux Gourmet. - Encourage her to visit Jamie Kaufman's website for additional reflux management strategies.   RTC for injection augmentation    Eli Adami, MD Otolaryngology [facility] Phone: [phone] Fax: [phone]  03/06/2024, 8:24 AM

## 2024-03-06 NOTE — Progress Notes (Signed)
 Subjective:    Patient ID: Carla Morales, female    DOB: 10-11-1961, 63 y.o.   MRN: [id number]  Chief Complaint  Patient presents with   gad   Diabetes   Pt presents to the office today to recheck GAD. She was seen on 01/24/24 and we started her Lexapro  5 mg then increased to 10 mg. Reports she can tell a difference in her anxiety.  Anxiety Presents for follow-up visit. Symptoms include excessive worry, nervous/anxious behavior and restlessness. Symptoms occur occasionally. The severity of symptoms is mild.    Diabetes She presents for her follow-up diabetic visit. She has type 2 diabetes mellitus. Hypoglycemia symptoms include nervousness/anxiousness. Pertinent negatives for diabetes include no blurred vision and no foot paresthesias. Risk factors for coronary artery disease include diabetes mellitus, hypertension, post-menopausal and sedentary lifestyle. Her overall blood glucose range is 110-130 mg/dl.      Review of Systems  Eyes:  Negative for blurred vision.  Psychiatric/Behavioral:  The patient is nervous/anxious.   All other systems reviewed and are negative.   Social History   Socioeconomic History   Marital status: Married    Spouse name: Not on file   Number of children: 1   Years of education: Not on file   Highest education level: Not on file  Occupational History   Not on file  Tobacco Use   Smoking status: Never    Passive exposure: Never   Smokeless tobacco: Never  Vaping Use   Vaping status: Never Used  Substance and Sexual Activity   Alcohol use: No    Alcohol/week: 0.0 standard drinks of alcohol   Drug use: No   Sexual activity: Yes    Partners: Male    Birth control/protection: Surgical    Comment: hyst.-1st intercourse 77 yo-1 partner, menarche 16yo  Other Topics Concern   Not on file  Social History Narrative   Not on file   Social Drivers of Health   Financial Resource Strain: Not on file  Food Insecurity: Not on file   Transportation Needs: Not on file  Physical Activity: Not on file  Stress: Not on file  Social Connections: Not on file   Family History  Problem Relation Age of Onset   Allergic rhinitis Mother    Asthma Mother    Rheum arthritis Mother    Fibromyalgia Mother    Cancer Mother        breast, uterine, adenocarcinoma-right lung   Uterine cancer Mother        Sarcoma   Lung cancer Mother        Adenocarcinoma   Breast cancer Mother 64   Hypertension Father    Melanoma Father 11   Asthma Sister    Asthma Maternal Aunt    Diabetes Maternal Aunt    Cancer Maternal Aunt 76       GYN cancer - possibly vulvar?   Lung cancer Maternal Aunt    Diabetes Maternal Uncle    Appendicitis Maternal Uncle        d. 12   Diabetes Paternal Aunt    Breast cancer Paternal Aunt 51   Breast cancer Paternal Aunt 63   Uterine cancer Paternal Aunt 45   Breast cancer Paternal Aunt 59   Diabetes Paternal Uncle    Other Maternal Grandmother 30       childbirth   Heart disease Maternal Grandfather    Cancer Paternal Grandmother        unknown   Other Daughter  Phyllodes tumor   Other Nephew        Gilbert's Disease   Colon cancer Neg Hx    Esophageal cancer Neg Hx    Ulcerative colitis Neg Hx    Stomach cancer Neg Hx    Pancreatic cancer Neg Hx         Objective:   Physical Exam Vitals reviewed.  Constitutional:      General: She is not in acute distress.    Appearance: She is well-developed. She is obese.  HENT:     Head: Normocephalic and atraumatic.  Eyes:     Pupils: Pupils are equal, round, and reactive to light.  Neck:     Thyroid : No thyromegaly.  Cardiovascular:     Rate and Rhythm: Normal rate and regular rhythm.     Heart sounds: Normal heart sounds. No murmur heard. Pulmonary:     Effort: Pulmonary effort is normal. No respiratory distress.     Breath sounds: Normal breath sounds. No wheezing.  Abdominal:     General: Bowel sounds are normal. There is no  distension.     Palpations: Abdomen is soft.     Tenderness: There is no abdominal tenderness.  Musculoskeletal:        General: No tenderness. Normal range of motion.     Cervical back: Normal range of motion and neck supple.  Skin:    General: Skin is warm and dry.  Neurological:     Mental Status: She is alert and oriented to person, place, and time.     Cranial Nerves: No cranial nerve deficit.     Deep Tendon Reflexes: Reflexes are normal and symmetric.  Psychiatric:        Behavior: Behavior normal.        Thought Content: Thought content normal.        Judgment: Judgment normal.       BP 125/70   Pulse 66   Temp (!) 97.5 F (36.4 C) (Temporal)   Ht 5\' 5"  (1.651 m)   Wt 195 lb 6.4 oz (88.6 kg)   LMP 07/24/2010   SpO2 96%   BMI 32.52 kg/m      Assessment & Plan:  Jamirah Zelaya comes in today with chief complaint of gad and Diabetes   Diagnosis and orders addressed:  1. GAD (generalized anxiety disorder) (Primary) Will increase Lexapro  to 20 mg from 10 mg  Stress management  - escitalopram  (LEXAPRO ) 20 MG tablet; Take 1 tablet (20 mg total) by mouth daily.  Dispense: 90 tablet; Refill: 1  2. Diabetes mellitus treated with oral medication (HCC) Will add Ozempic  0.25 mg  Low carb diet  New glucose meter Prescription sent to pharmacy  - Microalbumin / creatinine urine ratio - Blood Glucose Monitoring Suppl DEVI; 1 each by Does not apply route in the morning, at noon, and at bedtime. May substitute to any manufacturer covered by patient's insurance.  Dispense: 1 each; Refill: 0 - Glucose Blood (BLOOD GLUCOSE TEST STRIPS) STRP; 1 each by In Vitro route in the morning, at noon, and at bedtime. May substitute to any manufacturer covered by patient's insurance.  Dispense: 100 strip; Refill: 0 - Lancet Device MISC; 1 each by Does not apply route in the morning, at noon, and at bedtime. May substitute to any manufacturer covered by patient's insurance.  Dispense: 1  each; Refill: 0 - Lancets Misc. MISC; 1 each by Does not apply route in the morning, at noon, and at bedtime. May  substitute to any manufacturer covered by patient's insurance.  Dispense: 100 each; Refill: 0 - Semaglutide ,0.25 or 0.5MG /DOS, 2 MG/3ML SOPN; Inject 0.25 mg into the skin once a week.  Dispense: 3 mL; Refill: 1  3. Post-menopause - DG [facility] DEXA; Future   Labs pending Will increase Lexapro  to 20 mg from 10 mg  Will add Ozempic  .25 mg, may need to stop metformin   Low carb diet  Continue current medications  Keep follow up with specialists  Health Maintenance reviewed Diet and exercise encouraged  Return in about 1 month (around 04/05/2024), or if symptoms worsen or fail to improve, for GAD and Depression and DM.    Tommas Fragmin, FNP

## 2024-03-07 ENCOUNTER — Ambulatory Visit: Attending: Family

## 2024-03-07 DIAGNOSIS — R490 Dysphonia: Secondary | ICD-10-CM | POA: Diagnosis not present

## 2024-03-07 LAB — MICROALBUMIN / CREATININE URINE RATIO
Creatinine, Urine: 221.7 mg/dL
Microalb/Creat Ratio: 5 mg/g{creat} (ref 0–29)
Microalbumin, Urine: 10.9 ug/mL

## 2024-03-07 NOTE — Patient Instructions (Signed)
        Do this 10 reps each: twice a day   "ZH" going up and down like a siren   "Tongue trill"  up and down like a siren   "Lip trill" (raspberry) up and down like a siren   FEEL THE RELAXED FEELING IN YOUR THROAT

## 2024-03-07 NOTE — Therapy (Signed)
 OUTPATIENT SPEECH LANGUAGE PATHOLOGY VOICE TREATMENT/RENEWAL   Patient Name: Carla Morales MRN: [id number] DOB:May 31, 1961, 63 y.o., female Today's Date: 03/07/2024  PCP: Tommas Fragmin, FNP REFERRING PROVIDER: Artice Last, MD  END OF SESSION:  End of Session - 03/07/24 0933     Visit Number 12    Number of Visits 17    Date for SLP Re-Evaluation 04/18/24    SLP Start Time 0848    SLP Stop Time  0928    SLP Time Calculation (min) 40 min    Activity Tolerance Patient tolerated treatment well                       Past Medical History:  Diagnosis Date   Adenomatous colon polyp    Allergy     Arthritis    Asthma    Chondrocalcinosis    COVID-19    Diverticulosis    Elevated alkaline phosphatase level    Elevated antinuclear antibody (ANA) level    Eosinophilic esophagitis    Esophageal dysmotility    Esophageal stenosis    Fatty liver    GERD (gastroesophageal reflux disease)    Hyperlipidemia    Internal hemorrhoids    Migraine    Osteoarthritis    Pinched nerve in neck    Schatzki's ring    Sinus congestion    Urinary incontinence    Past Surgical History:  Procedure Laterality Date   ABDOMINAL HYSTERECTOMY     bladder dialation  1968   as a child   COLONOSCOPY     TONSILLECTOMY  1971   TOTAL VAGINAL HYSTERECTOMY  07/2010   menorrhagia, leiomyoma, prolapse   TUBAL LIGATION  1998   Patient Active Problem List   Diagnosis Date Noted   GAD (generalized anxiety disorder) 01/24/2024   Leg cramp 01/24/2024   Bilateral pes planus 10/16/2023   Mouth ulcer 10/10/2023   Eosinophilic esophagitis 10/10/2023   Osteoarthritis of left knee 09/18/2023   Pruritus 03/28/2023   Allergic conjunctivitis of both eyes 03/28/2023   Peripheral edema 07/11/2021   Asthma 01/07/2021   GERD (gastroesophageal reflux disease) 10/29/2019   Vitamin D  deficiency 10/29/2019   Family history of breast cancer    Family history of uterine cancer    Family  history of lung cancer    Family history of melanoma    Obesity (BMI 30-39.9) 03/29/2018   Perennial allergic rhinitis    Hyperlipidemia     Speech Therapy Progress Note  Number of ST sessions: 12   Subjective Statement: Pt has been seen for 12 visits ST focusing on voice quality  Objective: See below  Goal Update: See below  Plan: PT will cont to be seen every other week for 4 more sessions  Reason Skilled Services are Required: Pt has reverted to supraglottic compression again with voicing -observed on 03/06/24 at f/u ENT exam. She will undergo injection 04/03/24.    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   Pt accompanied by: self  PERTINENT HISTORY: GERD, Eosinophilic esophagitis, peripheral edema, asthma, mouth ulcer  PAIN:  Are you having pain? No  FALLS: Has patient fallen in last 6 months? Yes, Number of falls: 1  PATIENT GOALS: Hoarseness  going away  OBJECTIVE:  Note: Objective measures were completed at Evaluation unless otherwise noted.  DIAGNOSTIC FINDINGS:  ENT - [facility] 11/12/23 Procedure: The patient was seated upright in the exam chair.   Topical lidocaine  and Afrin were applied to the nasal cavity. After adequate anesthesia had occurred, the flexible telescope was passed into the nasal cavity. The nasopharynx was patent without mass or lesion. The scope was passed behind the soft palate and directed toward the base of tongue. The base of tongue was visualized and was symmetric with no apparent masses or abnormal appearing tissue. There were no signs of a mass or pooling of secretions in the piriform sinuses. The supraglottic structures were  normal.   The true vocal cords are mobile. The medial edges were bowed. Closure was incomplete with spindle-shaped glottic gap and supraglottic compression. Periodicity present. The mucosal wave and amplitude were symmetric and present. There is moderate interarytenoid pachydermia and post cricoid edema. The mucosa appears without lesions.   The laryngoscope was then slowly withdrawn and the patient tolerated the procedure well. There were no complications or blood loss.Assessment and Plan    Chronic dysphonia and Voice Fatigue Recurrent hoarseness and throat pain, exacerbated by talking and singing. Vocal folds appear thin with incomplete closure and mild edema on videostrobe today. No evidence of tumor or mass/no lesions mucosal wave intact. She had significant supraglottic compression and I believe there is an element of muscle tension which might cause throat discomfort/voice fatigue over time.  Discussed voice therapy as a first-line treatment to optimize breath support and reduce symptoms. Potential future interventions include vocal fold filler injection augmentation vs trial of Botox if voice therapy is ineffective and repeat exam will be c/w laryngeal spasm.  - Refer to voice therapy - Optimize reflux control - Continue current management of postnasal drainage - Consider elimination diet for EoE  - trial of Reflux Gourmet   Gastroesophageal Reflux Disease (GERD) GERD managed with Dexilant . Symptoms include throat pain and possible contribution to voice changes. Discussed optimizing reflux control through dietary adjustments and natural supplements. - Continue Dexilant  at current dose - Optimize reflux control through dietary adjustments and trial of reflux gourmet   Eosinophilic Esophagitis (EOE) EOE managed with Dupixent  and Dexilant . No significant improvement noted on recent endoscopy per report. No formal elimination diet in the past. Discussed the six-food elimination diet to  identify specific triggers and improve symptoms. - Provide information on six-food elimination diet - Encourage dietary adjustments to identify specific triggers   Asthma and Environmental Allergies Post-nasal drainage Nasal endoscopy today with S-shaped septum and septal deviation but no evidence of polyps or no pus to suggest sinusitis  Asthma with episodes of uncontrolled symptoms requiring increased medication. Currently on Dupixent , Dulera , Flonase /Asteline/Ipratropium Bromide . Discussed the importance of rinsing mouth after using inhalers to prevent thrush. - Continue current asthma medications (Dupixent , Dulera ) - Monitor for symptom control and adjust medications as needed - continue Flonase /Asteline/Ipratropium Bromide  daily 2 puffs b/l nares  - continue Xyzal 5 mg daily    Recurrent Thrush - no evidence of thrush on exam today Recurrent thrush associated with steroid use for asthma. No current evidence of thrush on examination. Discussed the importance of rinsing mouth after using inhalers to prevent recurrence. 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 No current ulceration or significant symptoms noted. Discussed monitoring for any changes or recurrence of symptoms and potential biopsy if ulceration recurs. 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 Discussed hyaluronic acid filler injection to improve vocal fold closure and sound projection. Explained procedure, including  topicalized lidocaine  and steps of the procedure. Potential side effects include initial raspiness and sore throat. Expected outcomes include improved sound projection and closure, lasting 6-12 months. Alternatives include continuing current therapy and focusing on voice exercises. - Provided detailed description and Q&A about the filler injection procedure in the after visit summary. - Advised her to schedule the procedure at checkout or call back when ready. - Recommend holding supplements that increase risk of bleeding for a few days prior to procedure to minimize bleeding risk..   Chronic nasal congestion Environmental Allergies Postnasal drip Chronic postnasal drip with ongoing symptoms despite current treatment. Improvement noted at approximately 50% with current therapy. - Continue current medications: Dupixent , Dulera , Flonase , Astelin , ipratropium bromide .   Gastroesophageal reflux disease LPR Chronic gastroesophageal reflux disease with ongoing symptoms. Reflux changes observed during scope exam.. Discussed importance of continued reflux management to prevent further vocal irritation and raspiness. - Continue current medications: Dexilant  and Reflux Gourmet. - Encourage her to visit Jamie Kaufman's website for additional reflux management strategies.   RTC for injection augmentation                                                                                                                            TREATMENT DATE:  Semi-Occluded Vocal Tract Exercises= SOVTE; Abdominal breathing=AB, Phonatory Resistance Training Exercises=PhoRTE  03/07/24: Pt was not completing PhoRTE /a/ as prescribed. SLP used mod-max A initially and pt was independent with /a as directed by task end. The other aspects of the program were completed with good volume and pitch. Pt states she has completed BID but maybe for a few days since last session. Today her sustained /a/ was measured at 13.5 seconds.  Today,  since compression was seen on ENT laryngeal exam yesterday, SLP worked with pt again on SOVTE and flow phonation tasks. Intermittent improved vocal quality observed today with just a short practice with vocal relaxation/vocal quality improvements pt and SLP agree staying at once every other week is appropriate at this time.   02/05/24: Pt completing PhoRTE "mostly" BID. Pt cont to maintain H2O >60 oz/daily. In 15 minute conversation today she used AB 90% success.  Pt cont with "rattle" intermittently throughout the day, and heard today, but maintains that her voice is definitely not tight like it was prior to this course of ST. SLP postulates rough quality heard is due to remaining glottal gap. Awaiting ENT follow up. Today  she sustained /a/ 9.1 seconds, which is 2 seconds better than at eval (7.02). PhoRTE completed with min A with high pitched sentences, faded to independence x10 consecutive correct productions. Pt and SLP agreed once a week x4, following pt's ENT follow up, was warranted and can be cancelled as necessary.  01/22/24: Pt's use of relaxed voice upon entering ST room was 50-60%. Pt maintained AB 100% of the time in conversation of 15 minutes. SLP encouraged pt to perform pursed lip singing to refocus "relaxed voice" when she experienced more hoarseness after speaking for 20 minutes. After pt did this her voice was less hoarse and maintained for 10 minutes. Cont to drink >60 oz H2O daily and completing PhoRTE on her own. Pt will be seen every other week at this time.   01/15/24: SLP engaged pt in 10 minutes conversation with 90% AB. Relaxed voice (less "edge" to pt's voice) heard 70% of the time. Pt is still drinking >60 oz water and has forgone caffeinated drinks after breakfast/morning. PhoRTE exercises completed today with initial cue for /a/ to make an /a/ and not "aw" vowel. Pt independent after this. SLP and pt agreed she could be seen once every week.  01/09/24: Water average remains >60 oz,  began last week. Pt arrived today with relaxed voice (almost WNL quality) 70-75% for initial conversation with SLP for 5 minutes. By session end, SLP heard almost WNL voice quality approx 50% of session. Pt performed PhoRTE with /a/ rare min A for relaxed voice (on rep #7) and not "vibrating voice." Pt able to correct on next production and maintain until rep #10. With numbers, SLP encouraged pt to "get higher than you tihnk you can" and her range improved dramatically. She was able to maintain this production range for all remaining reps. She performed high and low sentences with initial cue only. SLP told pt she may want to consider keeping a straw in her office and use it for straw phonation 3-4-5 times a day in order to habituate relaxed voice. If pt has relaxed voice 75% of next session overall and does not require SLP cues for PhoRTE, reduction to once/week will be strongly considered.   01/07/24:Sontee saw Dr. [facility] this morning, told SLP she could see pt was still dealing with post nasal drip. Today, pt demonstrated relaxed speech approx 60% of the time during conversation with focus on relaxed voice. With PhoRTE, pt req'd mod A usually, faded to usual min A with procedure with numbers section of PhoRTE. She will cont with PhoRTE at home, following SOVTE (straw singing).  12/31/23: Pt practiced once Friday morning, and twice Sunday and once in car coming to ST today. Pt appears to be dealing with persistent phlegm from a URI/cold last week. Clear voice observed 70% of the time with reading sentences.  SLP introduced pt to PhoRTE today and pt req'd mod A occasionally faded to rare min A. She req'd more cues for wider pitch range with numbers and lower voice for reading sentences task. See "pt instructions" for details.   12/27/23: SOVTE tasks (lip or tongue trill) paired with word level responses 88% WNL voicing. SLP attempted to incr complexity to sentences read without trill however pt success decr'd  to 68%. SLP added trill back again and success improved to 80%. SLP then worked towards not using trill prior to production but pt req'd use of trill for success > 80%. Homework for lip/tongue trill with words x10, then practice with lip/tongue trill with either  word or phrase response x10 minutes (BID) or x15 minutes (every day).  12/25/23: SLP should check H2O intake next session. Pt ran out of Zyzal and wonders if congestion for last few days has anything to do with this as well. SOVTE completed with initial cues to start lower pitch and go to higher pitch. Mostly-WNL voicing heard after 5 reps of lip trills and 5 reps of tongue trills. Pt stated she had been focusing more on AB and feeling a relaxed voice since last session. SLP worked with pt in sentence tasks and she exhibited WNL voice 70% and mostly-WNL voice 25%. AB was used 90% of the time. Homework to practice AB and "relaxed voice" for at least 15 minutes BID.   12/13/23: Pt has been practicing flow phonation but not SOVTE exercises. SLP targeted SOVTE since pt has not done those. SLP used "zh" ("measure") and pt with almost WNL voicing 90% of the time. The syllable "zha" was also successful as well as "zha zha" however "zha mama" was not successful. SLP went back to zha and then also did tongue and lip trills with WNL voicing. Pt was unable to transition to any real words at this time so homework is for "zh", tongue trill and lip trills with up and down pitch.  12/11/23: Pt had half of a Coke instead of full can of Coke last night. SLP cont to encourage pt to minimize caffeine and incr water (with a decaf drink mix if necessary). Today SLP worked with pt on AB at rest. Today SLP guided pt through SOVTE with straw in water, out of water all focusing on buzz in mouth and nose. SLP then had pt work without straw with /u/, /mmmmu/. SLP drew pt's attention to a more WNL sounding voice quality without the "edge" to it that she had when she arrived today.  Pt stated she felt the difference. Homework: see "pt instructions"  12/03/23 (eval): N/A  PATIENT EDUCATION: Education details: See "clinical impressions" below Person educated: Patient Education method: Explanation, Demonstration, Verbal cues, and Handouts Education comprehension: verbalized understanding, returned demonstration, verbal cues required, and needs further education  HOME EXERCISE PROGRAM: SOVTE, and other voice tasks at home. PhoRTE.   GOALS: Goals reviewed with patient? Yes  SHORT TERM GOALS: Target date: 01/03/24  Pt will complete exercises to reduce vocal tension (SOVTE and others) as directed at least days/week for two weeks via tracking sheet  Baseline: Goal status: met  2.  Pt will demo improved voice in sentence responses 70% in 3 sessions Baseline:  Goal status: Partially met  3.  Pt will achieve AB in sentence responses 70% in 3 sessions Baseline: 12/25/23, 12/31/23 Goal status: Met  4.  Pt will complete PhoRTE exercises with occasional min mod A Baseline:  Goal status: not met  5.  Pt will incr water intake to at least 48 oz/day at least 5 days in a 7 day period, as monitored by tracking sheet Baseline:  Goal status: Met   LONG TERM GOALS: Target date: 02/22/24, 04/18/24  Pt will improve PROM Baseline:  Goal status: INITIAL  2.  Pt will decr s/z ratio to <1.0 Baseline:  Goal status: INITIAL  3.  Pt will complete exercises to improve voice/decr glottal gap as directed at least 6 days/week for two weeks via tracking sheet  Baseline:  Goal status: met  4.  Pt will incr water intake to at least 60 oz/day at least 5 days in a 7 day period, as  monitored by tracking sheet Baseline:  Goal status: met  5  Pt will demo improved voice in 10 minutes conversation in 3 sessions Baseline: 01/15/24, 01/22/24 Goal status: INITIAL  6.  Pt will achieve AB to at least 70% in 10 minutes conversation in 3 sessions Baseline: 01/15/24, 01/22/24 Goal status:  Met  7. Pt will report better vocal stamina (greater than one hour) between 2 sessions Baseline:  Goal status: INITIAL  ASSESSMENT:  CLINICAL IMPRESSION: RENEWAL TODAY. Patient is a 63 y.o. 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 See "treatment date" for more details. Pt continues highly motivated and based upon her willingness to participate in ST, she remains a good candidate for skilled ST targeting improved vocal quality.   OBJECTIVE IMPAIRMENTS: include voice disorder. These impairments are limiting patient from return to work, household responsibilities, ADLs/IADLs, and effectively communicating at home and in community. Factors affecting potential to achieve goals and functional outcome are severity of impairments.. Patient will benefit from skilled SLP services to address above impairments and improve overall function.  REHAB POTENTIAL: Good  PLAN:  SLP FREQUENCY: every other week  SLP DURATION: 10 weeks  PLANNED INTERVENTIONS: Environmental controls, Internal/external aids, Functional tasks, SLP instruction and feedback, Compensatory strategies, Patient/family education, [phone] Treatment of speech (30 or 45 min) , and voice exercises    Andrews Tener, CCC-SLP 03/07/2024, 9:34 AM

## 2024-03-10 ENCOUNTER — Other Ambulatory Visit: Payer: Self-pay | Admitting: Family

## 2024-03-10 DIAGNOSIS — M8580 Other specified disorders of bone density and structure, unspecified site: Secondary | ICD-10-CM | POA: Insufficient documentation

## 2024-03-10 DIAGNOSIS — E119 Type 2 diabetes mellitus without complications: Secondary | ICD-10-CM

## 2024-03-12 ENCOUNTER — Other Ambulatory Visit (HOSPITAL_COMMUNITY): Payer: Self-pay

## 2024-03-12 ENCOUNTER — Ambulatory Visit

## 2024-03-12 ENCOUNTER — Telehealth: Payer: Self-pay

## 2024-03-12 MED ORDER — ONETOUCH VERIO FLEX SYSTEM W/DEVICE KIT: PACK | 1 refills | Status: DC

## 2024-03-12 NOTE — Telephone Encounter (Signed)
 Pharmacy Patient Advocate Encounter   Received notification from Patient Advice Request messages that prior authorization for Ozempic  2mg /21ml is required/requested.   Insurance verification completed.   The patient is insured through [facility] .   Per test claim: PA required; PA submitted to above mentioned insurance via CoverMyMeds Key/confirmation #/EOC  [id number] Status is pending

## 2024-03-13 NOTE — Telephone Encounter (Signed)
 Pharmacy Patient Advocate Encounter  Received notification from [facility] that Prior Authorization for Ozempic  2mg /55ml has been APPROVED from 03/12/24 to 03/12/25

## 2024-03-14 ENCOUNTER — Other Ambulatory Visit (HOSPITAL_COMMUNITY): Payer: Self-pay

## 2024-03-18 ENCOUNTER — Ambulatory Visit

## 2024-03-25 ENCOUNTER — Encounter

## 2024-03-31 ENCOUNTER — Ambulatory Visit: Attending: Family

## 2024-03-31 DIAGNOSIS — R490 Dysphonia: Secondary | ICD-10-CM | POA: Diagnosis not present

## 2024-03-31 NOTE — Therapy (Signed)
 OUTPATIENT SPEECH LANGUAGE PATHOLOGY VOICE TREATMENT   Patient Name: Carla Morales MRN: [id number] DOB:1961-09-12, 63 y.o., female Today's Date: 03/31/2024  PCP: Tommas Fragmin, FNP REFERRING PROVIDER: Artice Last, MD  END OF SESSION:  End of Session - 03/31/24 0906     Visit Number 13    Number of Visits 16    Date for SLP Re-Evaluation 05/02/24    SLP Start Time 0807    SLP Stop Time  0839    SLP Time Calculation (min) 32 min    Activity Tolerance Patient tolerated treatment well                        Past Medical History:  Diagnosis Date   Adenomatous colon polyp    Allergy     Arthritis    Asthma    Chondrocalcinosis    COVID-19    Diverticulosis    Elevated alkaline phosphatase level    Elevated antinuclear antibody (ANA) level    Eosinophilic esophagitis    Esophageal dysmotility    Esophageal stenosis    Fatty liver    GERD (gastroesophageal reflux disease)    Hyperlipidemia    Internal hemorrhoids    Migraine    Osteoarthritis    Pinched nerve in neck    Schatzki's ring    Sinus congestion    Urinary incontinence    Past Surgical History:  Procedure Laterality Date   ABDOMINAL HYSTERECTOMY     bladder dialation  1968   as a child   COLONOSCOPY     TONSILLECTOMY  1971   TOTAL VAGINAL HYSTERECTOMY  07/2010   menorrhagia, leiomyoma, prolapse   TUBAL LIGATION  1998   Patient Active Problem List   Diagnosis Date Noted   Osteopenia 03/10/2024   GAD (generalized anxiety disorder) 01/24/2024   Leg cramp 01/24/2024   Bilateral pes planus 10/16/2023   Mouth ulcer 10/10/2023   Eosinophilic esophagitis 10/10/2023   Osteoarthritis of left knee 09/18/2023   Pruritus 03/28/2023   Allergic conjunctivitis of both eyes 03/28/2023   Peripheral edema 07/11/2021   Asthma 01/07/2021   GERD (gastroesophageal reflux disease) 10/29/2019   Vitamin D  deficiency 10/29/2019   Family history of breast cancer    Family history of uterine  cancer    Family history of lung cancer    Family history of melanoma    Obesity (BMI 30-39.9) 03/29/2018   Perennial allergic rhinitis    Hyperlipidemia     S Onset date: 2020, worse 18 months ago  REFERRING DIAG:  R49.0 (ICD-10-CM) - Dysphonia  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 Carla Morales reports her hoarseness and squeezing/tension in her throat occurs after she has been talking for a while.  Pt accompanied by: self  PERTINENT HISTORY: GERD, Eosinophilic esophagitis, peripheral edema, asthma, mouth ulcer  PAIN:  Are you having pain? No  FALLS: Has patient fallen in last 6 months? Yes, Number of falls: 1  PATIENT GOALS: Hoarseness going away  OBJECTIVE:  Note: Objective measures were completed at Evaluation unless otherwise noted.  DIAGNOSTIC FINDINGS:  ENT - [facility] 11/12/23 Procedure: The patient was seated upright in the exam chair.   Topical lidocaine  and Afrin were applied to the nasal cavity. After adequate anesthesia had occurred, the flexible  telescope was passed into the nasal cavity. The nasopharynx was patent without mass or lesion. The scope was passed behind the soft palate and directed toward the base of tongue. The base of tongue was visualized and was symmetric with no apparent masses or abnormal appearing tissue. There were no signs of a mass or pooling of secretions in the piriform sinuses. The supraglottic structures were normal.   The true vocal cords are mobile. The medial edges were bowed. Closure was incomplete with spindle-shaped glottic gap and supraglottic compression. Periodicity present. The mucosal wave and amplitude were symmetric and present. There is moderate interarytenoid pachydermia  and post cricoid edema. The mucosa appears without lesions.   The laryngoscope was then slowly withdrawn and the patient tolerated the procedure well. There were no complications or blood loss.Assessment and Plan    Chronic dysphonia and Voice Fatigue Recurrent hoarseness and throat pain, exacerbated by talking and singing. Vocal folds appear thin with incomplete closure and mild edema on videostrobe today. No evidence of tumor or mass/no lesions mucosal wave intact. She had significant supraglottic compression and I believe there is an element of muscle tension which might cause throat discomfort/voice fatigue over time.  Discussed voice therapy as a first-line treatment to optimize breath support and reduce symptoms. Potential future interventions include vocal fold filler injection augmentation vs trial of Botox if voice therapy is ineffective and repeat exam will be c/w laryngeal spasm.  - Refer to voice therapy - Optimize reflux control - Continue current management of postnasal drainage - Consider elimination diet for EoE  - trial of Reflux Gourmet   Gastroesophageal Reflux Disease (GERD) GERD managed with Dexilant . Symptoms include throat pain and possible contribution to voice changes. Discussed optimizing reflux control through dietary adjustments and natural supplements. - Continue Dexilant  at current dose - Optimize reflux control through dietary adjustments and trial of reflux gourmet   Eosinophilic Esophagitis (EOE) EOE managed with Dupixent  and Dexilant . No significant improvement noted on recent endoscopy per report. No formal elimination diet in the past. Discussed the six-food elimination diet to identify specific triggers and improve symptoms. - Provide information on six-food elimination diet - Encourage dietary adjustments to identify specific triggers   Asthma and Environmental Allergies Post-nasal drainage Nasal endoscopy today with S-shaped septum and septal deviation but  no evidence of polyps or no pus to suggest sinusitis  Asthma with episodes of uncontrolled symptoms requiring increased medication. Currently on Dupixent , Dulera , Flonase /Asteline/Ipratropium Bromide . Discussed the importance of rinsing mouth after using inhalers to prevent thrush. - Continue current asthma medications (Dupixent , Dulera ) - Monitor for symptom control and adjust medications as needed - continue Flonase /Asteline/Ipratropium Bromide  daily 2 puffs b/l nares  - continue Xyzal 5 mg daily    Recurrent Thrush - no evidence of thrush on exam today Recurrent thrush associated with steroid use for asthma. No current evidence of thrush on examination. Discussed the importance of rinsing mouth after using inhalers to prevent recurrence. 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 No current ulceration or significant symptoms noted. Discussed monitoring for any changes or recurrence of symptoms and potential biopsy if ulceration recurs. 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 Discussed hyaluronic acid filler injection to improve vocal fold closure and sound projection. Explained procedure, including topicalized lidocaine  and steps of the procedure. Potential side effects include initial raspiness and sore throat. Expected outcomes include improved sound projection and closure, lasting 6-12 months. Alternatives include continuing current therapy and focusing on voice exercises. - Provided  detailed description and Q&A about the filler injection procedure in the after visit summary. - Advised her to schedule the procedure at checkout or call back when ready. - Recommend holding supplements that increase risk of bleeding for a few days prior to procedure to minimize bleeding risk..   Chronic nasal congestion Environmental Allergies Postnasal drip Chronic postnasal drip with ongoing symptoms despite current treatment. Improvement noted at approximately 50% with current therapy. - Continue current medications: Dupixent , Dulera , Flonase , Astelin , ipratropium bromide .   Gastroesophageal reflux disease LPR Chronic gastroesophageal reflux disease with ongoing symptoms. Reflux changes observed during scope exam.. Discussed importance of continued reflux management to prevent further vocal irritation and raspiness. - Continue current medications: Dexilant  and Reflux Gourmet. - Encourage her to visit Jamie Kaufman's website for additional reflux management strategies.   RTC for injection augmentation                                                                                                                            TREATMENT DATE:  Semi-Occluded Vocal Tract Exercises= SOVTE; Abdominal breathing=AB, Phonatory Resistance Training Exercises=PhoRTE.  03/31/24: Almira Armour expressed some wondering about her need for vocal rest after her vocal fold injection Thursday. SLP provided reassurance about her choice and that she will receive good post-sx instructions.  She was outdoors a lot this weekend and feels a lot of phlegm in throat this morning. Trayonna has not been completing PhoRTE or SOVTE consistently since previous session, but has been able to complete on those days when she feels better from allergy  symptoms; Has been using Reflux Gourmet.  Vocal stamina is better than prior to ST. SOVTE completed with initial min A for incr'd pitch variation in high pitch range, rare min A throughout exercise  ("zh", lip trill, and tongue trill) for incr'd pitch variation. She did not clear throat once, and drank water during the session today. Pt tries to have ~80oz water or water based drinks daily.  Almira Armour and SLP agreed to at least one more visit post injection - due to scheduling, pt will arrive either 04/23/24 or 05/02/24. She and SLP will discuss the need for further ST at that appointment. Given the fact that she reports incr'd tightness/tension when talking for a while, SLP postulates that vocal fatigue is playing a role in compression and theoretically if glottal gap is closed pt should have decr'd vocal fatigue.  03/07/24: Pt was not completing PhoRTE /a/ as prescribed. SLP used mod-max A initially and pt was independent with /  a as directed by task end. The other aspects of the program were completed with good volume and pitch. Pt states she has completed BID but maybe for a few days since last session. Today her sustained /a/ was measured at 13.5 seconds.  Today, since compression was seen on ENT laryngeal exam yesterday, SLP worked with pt again on SOVTE and flow phonation tasks. Intermittent improved vocal quality observed today with just a short practice with vocal relaxation/vocal quality improvements pt and SLP agree staying at once every other week is appropriate at this time.   02/05/24: Pt completing PhoRTE "mostly" BID. Pt cont to maintain H2O >60 oz/daily. In 15 minute conversation today she used AB 90% success.  Pt cont with "rattle" intermittently throughout the day, and heard today, but maintains that her voice is definitely not tight like it was prior to this course of ST. SLP postulates rough quality heard is due to remaining glottal gap. Awaiting ENT follow up. Today she sustained /a/ 9.1 seconds, which is 2 seconds better than at eval (7.02). PhoRTE completed with min A with high pitched sentences, faded to independence x10 consecutive correct productions. Pt and SLP agreed once a week x4,  following pt's ENT follow up, was warranted and can be cancelled as necessary.  01/22/24: Pt's use of relaxed voice upon entering ST room was 50-60%. Pt maintained AB 100% of the time in conversation of 15 minutes. SLP encouraged pt to perform pursed lip singing to refocus "relaxed voice" when she experienced more hoarseness after speaking for 20 minutes. After pt did this her voice was less hoarse and maintained for 10 minutes. Cont to drink >60 oz H2O daily and completing PhoRTE on her own. Pt will be seen every other week at this time.   01/15/24: SLP engaged pt in 10 minutes conversation with 90% AB. Relaxed voice (less "edge" to pt's voice) heard 70% of the time. Pt is still drinking >60 oz water and has forgone caffeinated drinks after breakfast/morning. PhoRTE exercises completed today with initial cue for /a/ to make an /a/ and not "aw" vowel. Pt independent after this. SLP and pt agreed she could be seen once every week.  01/09/24: Water average remains >60 oz, began last week. Pt arrived today with relaxed voice (almost WNL quality) 70-75% for initial conversation with SLP for 5 minutes. By session end, SLP heard almost WNL voice quality approx 50% of session. Pt performed PhoRTE with /a/ rare min A for relaxed voice (on rep #7) and not "vibrating voice." Pt able to correct on next production and maintain until rep #10. With numbers, SLP encouraged pt to "get higher than you tihnk you can" and her range improved dramatically. She was able to maintain this production range for all remaining reps. She performed high and low sentences with initial cue only. SLP told pt she may want to consider keeping a straw in her office and use it for straw phonation 3-4-5 times a day in order to habituate relaxed voice. If pt has relaxed voice 75% of next session overall and does not require SLP cues for PhoRTE, reduction to once/week will be strongly considered.   01/07/24:Tomie saw Dr. [facility] this morning, told  SLP she could see pt was still dealing with post nasal drip. Today, pt demonstrated relaxed speech approx 60% of the time during conversation with focus on relaxed voice. With PhoRTE, pt req'd mod A usually, faded to usual min A with procedure with numbers section of PhoRTE. She will  cont with PhoRTE at home, following SOVTE (straw singing).  12/31/23: Pt practiced once Friday morning, and twice Sunday and once in car coming to ST today. Pt appears to be dealing with persistent phlegm from a URI/cold last week. Clear voice observed 70% of the time with reading sentences.  SLP introduced pt to PhoRTE today and pt req'd mod A occasionally faded to rare min A. She req'd more cues for wider pitch range with numbers and lower voice for reading sentences task. See "pt instructions" for details.   12/27/23: SOVTE tasks (lip or tongue trill) paired with word level responses 88% WNL voicing. SLP attempted to incr complexity to sentences read without trill however pt success decr'd to 68%. SLP added trill back again and success improved to 80%. SLP then worked towards not using trill prior to production but pt req'd use of trill for success > 80%. Homework for lip/tongue trill with words x10, then practice with lip/tongue trill with either word or phrase response x10 minutes (BID) or x15 minutes (every day).  12/25/23: SLP should check H2O intake next session. Pt ran out of Zyzal and wonders if congestion for last few days has anything to do with this as well. SOVTE completed with initial cues to start lower pitch and go to higher pitch. Mostly-WNL voicing heard after 5 reps of lip trills and 5 reps of tongue trills. Pt stated she had been focusing more on AB and feeling a relaxed voice since last session. SLP worked with pt in sentence tasks and she exhibited WNL voice 70% and mostly-WNL voice 25%. AB was used 90% of the time. Homework to practice AB and "relaxed voice" for at least 15 minutes BID.   12/13/23: Pt has  been practicing flow phonation but not SOVTE exercises. SLP targeted SOVTE since pt has not done those. SLP used "zh" ("measure") and pt with almost WNL voicing 90% of the time. The syllable "zha" was also successful as well as "zha zha" however "zha mama" was not successful. SLP went back to zha and then also did tongue and lip trills with WNL voicing. Pt was unable to transition to any real words at this time so homework is for "zh", tongue trill and lip trills with up and down pitch.  12/11/23: Pt had half of a Coke instead of full can of Coke last night. SLP cont to encourage pt to minimize caffeine and incr water (with a decaf drink mix if necessary). Today SLP worked with pt on AB at rest. Today SLP guided pt through SOVTE with straw in water, out of water all focusing on buzz in mouth and nose. SLP then had pt work without straw with /u/, /mmmmu/. SLP drew pt's attention to a more WNL sounding voice quality without the "edge" to it that she had when she arrived today. Pt stated she felt the difference. Homework: see "pt instructions"  12/03/23 (eval): N/A  PATIENT EDUCATION: Education details: See "clinical impressions" below Person educated: Patient Education method: Explanation, Demonstration, Verbal cues, and Handouts Education comprehension: verbalized understanding, returned demonstration, verbal cues required, and needs further education  HOME EXERCISE PROGRAM: SOVTE, and other voice tasks at home. PhoRTE.   GOALS: Goals reviewed with patient? Yes  SHORT TERM GOALS: Target date: 01/03/24  Pt will complete exercises to reduce vocal tension (SOVTE and others) as directed at least days/week for two weeks via tracking sheet  Baseline: Goal status: met  2.  Pt will demo improved voice in sentence responses 70%  in 3 sessions Baseline:  Goal status: Partially met  3.  Pt will achieve AB in sentence responses 70% in 3 sessions Baseline: 12/25/23, 12/31/23 Goal status: Met  4.  Pt will  complete PhoRTE exercises with occasional min mod A Baseline:  Goal status: not met  5.  Pt will incr water intake to at least 48 oz/day at least 5 days in a 7 day period, as monitored by tracking sheet Baseline:  Goal status: Met   LONG TERM GOALS: Target date: 02/22/24, 04/18/24  Pt will improve PROM Baseline:  Goal status: INITIAL  2.  Pt will decr s/z ratio to <1.0 Baseline:  Goal status: INITIAL  3.  Pt will complete exercises to improve voice/decr glottal gap as directed at least 6 days/week for two weeks via tracking sheet  Baseline:  Goal status: met  4.  Pt will incr water intake to at least 60 oz/day at least 5 days in a 7 day period, as monitored by tracking sheet Baseline:  Goal status: met  5  Pt will demo improved voice in 10 minutes conversation in 3 sessions Baseline: 01/15/24, 01/22/24 Goal status: INITIAL  6.  Pt will achieve AB to at least 70% in 10 minutes conversation in 3 sessions Baseline: 01/15/24, 01/22/24 Goal status: Met  7. Pt will report better vocal stamina (greater than one hour) between 2 sessions Baseline: 03/31/24 Goal status: INITIAL  ASSESSMENT:  CLINICAL IMPRESSION: Patient is a 63 y.o. 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 See "treatment date" for more details. Pt continues highly motivated and based upon her willingness to participate in ST, she remains a good candidate for skilled ST targeting improved vocal quality.   OBJECTIVE IMPAIRMENTS: include voice disorder. These impairments are limiting patient from return to work, household responsibilities, ADLs/IADLs, and effectively communicating at home and in community. Factors affecting potential to achieve goals and functional outcome are severity of impairments.. Patient  will benefit from skilled SLP services to address above impairments and improve overall function.  REHAB POTENTIAL: Good  PLAN:  SLP FREQUENCY: every other week  SLP DURATION: 10 weeks  PLANNED INTERVENTIONS: Environmental controls, Internal/external aids, Functional tasks, SLP instruction and feedback, Compensatory strategies, Patient/family education, [phone] Treatment of speech (30 or 45 min) , and voice exercises    Kathryn Linarez, CCC-SLP 03/31/2024, 9:06 AM

## 2024-04-02 ENCOUNTER — Telehealth (INDEPENDENT_AMBULATORY_CARE_PROVIDER_SITE_OTHER): Payer: Self-pay | Admitting: Otolaryngology

## 2024-04-02 NOTE — Telephone Encounter (Signed)
 Provided [address] address to patient for appt on 04/03/2024.

## 2024-04-03 ENCOUNTER — Ambulatory Visit (INDEPENDENT_AMBULATORY_CARE_PROVIDER_SITE_OTHER): Admitting: Otolaryngology

## 2024-04-03 ENCOUNTER — Ambulatory Visit: Admitting: Family

## 2024-04-10 ENCOUNTER — Telehealth (INDEPENDENT_AMBULATORY_CARE_PROVIDER_SITE_OTHER): Payer: Self-pay | Admitting: Otolaryngology

## 2024-04-10 DIAGNOSIS — H40033 Anatomical narrow angle, bilateral: Secondary | ICD-10-CM | POA: Diagnosis not present

## 2024-04-10 DIAGNOSIS — E119 Type 2 diabetes mellitus without complications: Secondary | ICD-10-CM | POA: Diagnosis not present

## 2024-04-10 NOTE — Telephone Encounter (Signed)
 Confirmed location and appt with patient

## 2024-04-11 ENCOUNTER — Ambulatory Visit (INDEPENDENT_AMBULATORY_CARE_PROVIDER_SITE_OTHER): Admitting: Otolaryngology

## 2024-04-11 ENCOUNTER — Encounter (INDEPENDENT_AMBULATORY_CARE_PROVIDER_SITE_OTHER): Payer: Self-pay | Admitting: Otolaryngology

## 2024-04-11 VITALS — BP 138/81 | HR 92

## 2024-04-11 DIAGNOSIS — R490 Dysphonia: Secondary | ICD-10-CM | POA: Diagnosis not present

## 2024-04-11 DIAGNOSIS — J383 Other diseases of vocal cords: Secondary | ICD-10-CM

## 2024-04-11 MED ORDER — DOXYCYCLINE HYCLATE 100 MG PO TABS: 100.0000 mg | ORAL_TABLET | Freq: Two times a day (BID) | ORAL | 0 refills | Status: DC

## 2024-04-11 MED ORDER — METHYLPREDNISOLONE 4 MG PO TBPK: ORAL_TABLET | ORAL | 1 refills | Status: DC

## 2024-04-11 NOTE — Progress Notes (Signed)
 Therapeutic Vocal Cord Injection CPT 16109-60 ATTENDING: Artice Last, MD   PREOPERATIVE DIAGNOSIS(ES):  1. Bilateral vocal fold atrophy 2. Hoarseness; dysphonia 3. Glottic insufficiency  POSTOPERATIVE DIAGNOSIS(ES): Same  PROCEDURE PERFORMED: Laryngoscopy with restylane injection into the bilateral lateral thyroarytenoid muscle(s)  INDICATIONS FOR PROCEDURE: The risks and benefits of the surgical procedure have been explained in detail to the patient and they have elected to proceed.  CONSENT:  Informed consent was obtained prior to the procedure after discussion of risks, benefits, and alternatives and expected outcomes were discussed with the patient; consent placed in chart. The possibilities of reaction to medication, pulmonary aspiration, bleeding, infection, the need for additional procedures, failure to diagnose a condition, and creating a complication requiring transfusion or operation were discussed with the patient. The patient concurred with the proposed plan, giving informed consent.    UNIVERSAL PROTOCOL/ TIMEOUT: Preprocedure verification is complete- patient verified and consents confirmed.  ANESTHESIA: local anesthesia H&P REVIEW: The patient's history and physical were reviewed today prior to procedure. All medications were reviewed and updated as well.  PROCEDURE DETAILS:  The patient was brought to the clinic and placed in a seated position.  The anterior neck skin was then cleansed with alcohol and 1% Lidocaine  was used to infiltrate the skin overlying the thyrohyoid membrane. Patient had a NEB with 4% lidocaine  prior the start of the procedure, to ensure good anesthesia and procedure tolerance. Afrin/Lidocaine  mixture was then used to anesthetize the nasal passages. The 1.5-inch 25-gauge needle was bent with two gentle curves in same direction.  The flexible laryngoscope was then passed through the patient's nasal passageway and advanced into the larynx. The nasopharynx  was free of any lesions. The scope was passed behind the soft palate and directed toward the base of tongue. The supraglottic structures were normal in appearance. The true folds show atrophy and left vocal fold paralysis. The 23-gauge needle was passed through the petiole and 4% lidocaine  was dripped on the cords while the patient phonated. It was then attached to a luer-lock syringe filled with the filler and advanced into the vocal fold, and injection was performed into the bilateral thyroarytenoid muscle(s) at a site just anterior to the vocal process. A second injection was performed at mid-fold. The augmentation carried out until some overmedialization was noted. This was then repeated on the contralateral side. The needle was then removed from the vocal fold and the airway. The scope was removed from the nasal cavity. This completed the procedure. The patient tolerated the procedure well. IMPLANTS: Restylane-L Hyaluronic Acid  ESTIMATED BLOOD LOSS: None  SPECIMEN(S) REMOVED: None  DISPOSITION OF SPECIMEN(S): NA.  FINDINGS:  No evidence of a hematoma and the airway remained patent  CONDITION: Stable  COMPLICATIONS:The patient tolerated the procedure well without apparent complications and was ambulatory.  NOTES: will likely require repeat injection on the right side 2/2 difficulty with R sided injection - anterior neck soft tissue - challenges with R sided injection (left one went well)  PLAN: RTC 3 weeks for repeat videostrobe  Rx for Doxy and Medrol  pack given

## 2024-04-23 ENCOUNTER — Ambulatory Visit

## 2024-05-02 ENCOUNTER — Ambulatory Visit: Attending: Otolaryngology

## 2024-05-02 DIAGNOSIS — R490 Dysphonia: Secondary | ICD-10-CM | POA: Diagnosis not present

## 2024-05-02 NOTE — Therapy (Signed)
 OUTPATIENT SPEECH LANGUAGE PATHOLOGY VOICE TREATMENT/RECERTIFICATION   Patient Name: Carla Morales MRN: [id number] DOB:21-Apr-1961, 63 y.o., female Today's Date: 05/02/2024  PCP: Tommas Fragmin, FNP REFERRING PROVIDER: Artice Last, MD  END OF SESSION:  End of Session - 05/02/24 0843     Visit Number 14    Number of Visits 17    Date for SLP Re-Evaluation 06/27/24    SLP Start Time 0804    SLP Stop Time  0842    SLP Time Calculation (min) 38 min    Activity Tolerance Patient tolerated treatment well                      Past Medical History:  Diagnosis Date   Adenomatous colon polyp    Allergy     Arthritis    Asthma    Chondrocalcinosis    COVID-19    Diverticulosis    Elevated alkaline phosphatase level    Elevated antinuclear antibody (ANA) level    Eosinophilic esophagitis    Esophageal dysmotility    Esophageal stenosis    Fatty liver    GERD (gastroesophageal reflux disease)    Hyperlipidemia    Internal hemorrhoids    Migraine    Osteoarthritis    Pinched nerve in neck    Schatzki's ring    Sinus congestion    Urinary incontinence    Past Surgical History:  Procedure Laterality Date   ABDOMINAL HYSTERECTOMY     bladder dialation  1968   as a child   COLONOSCOPY     TONSILLECTOMY  1971   TOTAL VAGINAL HYSTERECTOMY  07/2010   menorrhagia, leiomyoma, prolapse   TUBAL LIGATION  1998   Patient Active Problem List   Diagnosis Date Noted   Osteopenia 03/10/2024   GAD (generalized anxiety disorder) 01/24/2024   Leg cramp 01/24/2024   Bilateral pes planus 10/16/2023   Mouth ulcer 10/10/2023   Eosinophilic esophagitis 10/10/2023   Osteoarthritis of left knee 09/18/2023   Pruritus 03/28/2023   Allergic conjunctivitis of both eyes 03/28/2023   Peripheral edema 07/11/2021   Asthma 01/07/2021   GERD (gastroesophageal reflux disease) 10/29/2019   Vitamin D  deficiency 10/29/2019   Family history of breast cancer    Family  history of uterine cancer    Family history of lung cancer    Family history of melanoma    Obesity (BMI 30-39.9) 03/29/2018   Perennial allergic rhinitis    Hyperlipidemia    Speech Therapy Progress Note  Total # visits:: 14   Subjective Statement: Pt has been seen for 14 visits focusing on voice quality.  Objective: Pt's sustained /a/ and s/z measurements have improved following vocal fold injection. She continues to report congestion which she states affects vocal quality.   Goal Update: See below.  Plan: See pt one more visit following her next appointment with Dr. Soldatova on 05/07/24.  Reason Skilled Services are Required: Verify progress remains over time and adjust home exercises PRN.   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  Pt accompanied by: self  PERTINENT HISTORY: GERD, Eosinophilic esophagitis, peripheral edema, asthma, mouth ulcer  PAIN:  Are you having pain? Yes: NPRS scale: 5/10 Pain location:  sinuses and back of neck Pain description: ache Aggravating factors: nothing Relieving factors: nothing  FALLS: Has patient fallen in last 6 months? Yes, Number of falls: 1  PATIENT GOALS: Hoarseness going away  OBJECTIVE:  Note: Objective measures were completed at Evaluation unless otherwise noted.  DIAGNOSTIC FINDINGS:  ENT - Soldatova 04/11/24 Therapeutic Vocal Cord Injection CPT 16109-60 ATTENDING: Artice Last, MD   PREOPERATIVE DIAGNOSIS(ES):  1. Bilateral vocal fold atrophy 2. Hoarseness; dysphonia 3. Glottic insufficiency   POSTOPERATIVE DIAGNOSIS(ES): Same   PROCEDURE PERFORMED: Laryngoscopy with restylane injection  into the bilateral lateral thyroarytenoid muscle(s)   INDICATIONS FOR PROCEDURE: The risks and benefits of the surgical procedure have been explained in detail to the patient and they have elected to proceed.   CONSENT:  Informed consent was obtained prior to the procedure after discussion of risks, benefits, and alternatives and expected outcomes were discussed with the patient; consent placed in chart. The possibilities of reaction to medication, pulmonary aspiration, bleeding, infection, the need for additional procedures, failure to diagnose a condition, and creating a complication requiring transfusion or operation were discussed with the patient. The patient concurred with the proposed plan, giving informed consent.     UNIVERSAL PROTOCOL/ TIMEOUT: Preprocedure verification is complete- patient verified and consents confirmed.   ANESTHESIA: local anesthesia H&P REVIEW: The patient's history and physical were reviewed today prior to procedure. All medications were reviewed and updated as well.   PROCEDURE DETAILS:  The patient was brought to the clinic and placed in a seated position.  The anterior neck skin was then cleansed with alcohol and 1% Lidocaine  was used to infiltrate the skin overlying the thyrohyoid membrane. Patient had a NEB with 4% lidocaine  prior the start of the procedure, to ensure good anesthesia and procedure tolerance. Afrin/Lidocaine  mixture was then used to anesthetize the nasal passages. The 1.5-inch 25-gauge needle was bent with two gentle curves in same direction.  The flexible laryngoscope was then passed through the patient's nasal passageway and advanced into the larynx. The nasopharynx was free of any lesions. The scope was passed behind the soft palate and directed toward the base of tongue. The supraglottic structures were normal in appearance. The true folds show atrophy and left vocal fold paralysis. The 23-gauge needle was passed through the petiole and 4% lidocaine   was dripped on the cords while the patient phonated. It was then attached to a luer-lock syringe filled with the filler and advanced into the vocal fold, and injection was performed into the bilateral thyroarytenoid muscle(s) at a site just anterior to the vocal process. A second injection was performed at mid-fold. The augmentation carried out until some overmedialization was noted. This was then repeated on the contralateral side. The needle was then removed from the vocal fold and the airway. The scope was removed from the nasal cavity. This completed the procedure. The patient tolerated the procedure well. IMPLANTS: Restylane-L Hyaluronic Acid   ESTIMATED BLOOD LOSS: None   SPECIMEN(S) REMOVED: None   DISPOSITION OF SPECIMEN(S): NA.   FINDINGS:  No evidence of a hematoma and the airway remained patent   CONDITION: Stable   COMPLICATIONS:The patient tolerated the procedure well without apparent complications and was ambulatory.   NOTES: will likely require repeat injection on the right side 2/2 difficulty with R sided injection - anterior neck soft tissue - challenges with R sided injection (left one went well)   Update ENT - [facility] 03/06/2024 Assessment & Plan Dysphonia, chronic, Vocal fold atrophy and glottic insufficiency Vocal  fold atrophy with thinning of the VFs and supraglottic compression observed, contributing to voice changes and potential supraglottic compression. Discussed hyaluronic acid filler injection to improve vocal fold closure and sound projection. Explained procedure, including topicalized lidocaine  and steps of the procedure. Potential side effects include initial raspiness and sore throat. Expected outcomes include improved sound projection and closure, lasting 6-12 months. Alternatives include continuing current therapy and focusing on voice exercises. - Provided detailed description and Q&A about the filler injection procedure in the after visit summary. - Advised  her to schedule the procedure at checkout or call back when ready. - Recommend holding supplements that increase risk of bleeding for a few days prior to procedure to minimize bleeding risk..   Chronic nasal congestion Environmental Allergies Postnasal drip Chronic postnasal drip with ongoing symptoms despite current treatment. Improvement noted at approximately 50% with current therapy. - Continue current medications: Dupixent , Dulera , Flonase , Astelin , ipratropium bromide .   Gastroesophageal reflux disease LPR Chronic gastroesophageal reflux disease with ongoing symptoms. Reflux changes observed during scope exam.. Discussed importance of continued reflux management to prevent further vocal irritation and raspiness. - Continue current medications: Dexilant  and Reflux Gourmet. - Encourage her to visit Jamie Kaufman's website for additional reflux management strategies.   RTC for injection augmentation   ENT - Soldatova 11/12/23 Procedure: The patient was seated upright in the exam chair.   Topical lidocaine  and Afrin were applied to the nasal cavity. After adequate anesthesia had occurred, the flexible telescope was passed into the nasal cavity. The nasopharynx was patent without mass or lesion. The scope was passed behind the soft palate and directed toward the base of tongue. The base of tongue was visualized and was symmetric with no apparent masses or abnormal appearing tissue. There were no signs of a mass or pooling of secretions in the piriform sinuses. The supraglottic structures were normal.   The true vocal cords are mobile. The medial edges were bowed. Closure was incomplete with spindle-shaped glottic gap and supraglottic compression. Periodicity present. The mucosal wave and amplitude were symmetric and present. There is moderate interarytenoid pachydermia and post cricoid edema. The mucosa appears without lesions.   The laryngoscope was then slowly withdrawn and the patient tolerated  the procedure well. There were no complications or blood loss.Assessment and Plan    Chronic dysphonia and Voice Fatigue Recurrent hoarseness and throat pain, exacerbated by talking and singing. Vocal folds appear thin with incomplete closure and mild edema on videostrobe today. No evidence of tumor or mass/no lesions mucosal wave intact. She had significant supraglottic compression and I believe there is an element of muscle tension which might cause throat discomfort/voice fatigue over time.  Discussed voice therapy as a first-line treatment to optimize breath support and reduce symptoms. Potential future interventions include vocal fold filler injection augmentation vs trial of Botox if voice therapy is ineffective and repeat exam will be c/w laryngeal spasm.  - Refer to voice therapy - Optimize reflux control - Continue current management of postnasal drainage - Consider elimination diet for EoE  - trial of Reflux Gourmet   Gastroesophageal Reflux Disease (GERD) GERD managed with Dexilant . Symptoms include throat pain and possible contribution to voice changes. Discussed optimizing reflux control through dietary adjustments and natural supplements. - Continue Dexilant  at current dose - Optimize reflux control through dietary adjustments and trial of reflux gourmet   Eosinophilic Esophagitis (EOE) EOE managed with Dupixent  and Dexilant . No significant improvement noted on recent endoscopy per report. No formal elimination diet in  the past. Discussed the six-food elimination diet to identify specific triggers and improve symptoms. - Provide information on six-food elimination diet - Encourage dietary adjustments to identify specific triggers   Asthma and Environmental Allergies Post-nasal drainage Nasal endoscopy today with S-shaped septum and septal deviation but no evidence of polyps or no pus to suggest sinusitis  Asthma with episodes of uncontrolled symptoms requiring increased  medication. Currently on Dupixent , Dulera , Flonase /Asteline/Ipratropium Bromide . Discussed the importance of rinsing mouth after using inhalers to prevent thrush. - Continue current asthma medications (Dupixent , Dulera ) - Monitor for symptom control and adjust medications as needed - continue Flonase /Asteline/Ipratropium Bromide  daily 2 puffs b/l nares  - continue Xyzal 5 mg daily    Recurrent Thrush - no evidence of thrush on exam today Recurrent thrush associated with steroid use for asthma. No current evidence of thrush on examination. Discussed the importance of rinsing mouth after using inhalers to prevent recurrence. 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 No current ulceration or significant symptoms noted. Discussed monitoring for any changes or recurrence of symptoms and potential biopsy if ulceration recurs. - Monitor for any changes or recurrence of symptoms - Consider biopsy if ulceration recurs                                                                                                                           TREATMENT DATE:  Semi-Occluded Vocal Tract Exercises= SOVTE; Abdominal breathing=AB, Phonatory Resistance Training Exercises=PhoRTE.  05/02/24: Pt reports she still has nasal congestion which plays a role in decr'ing her vocal quality. She continues to report vocal stamina of >1 hour but states this is dependent somewhat on amount/presence of congestion. Pt's lip and tongue trills were completed with excellent success, with voicing noted from low continuously to high pitches which is different than prior to injections. SLP reinforced with pt the rationale for these tasks, and the need for her to continue these in order to maintain and reinforce a more relaxed vocal  production. SLP again postulates that if pt's voice quality decreases during the day she begins to use more compression/tension in her vocal production to compensate. SLP shared this with pt and strongly encouraged her to keep relaxed vocal production as in the trills even after her vocal quality may decr. SLP assisted pt in scripting some explanations for customers for her reduced vocal quality.  Sustained /a/ today was measured at average 13.1 seconds. Pt's s/z ratio is 1.2, which is less than initial evaluation but still indicative of possible vocal fold pathology. Pt goes for ENT f/u and possible repeat rt vocal fold injection, on Wednesday.  Pt and SLP agreed pt should be scheduled for one additional session following her  ENT follow up on 05/07/24 to verify progress and  ensure she is comfortable with her voice quality, with SLP present.    03/31/24: Almira Armour expressed some wondering about her need for vocal rest after her vocal fold injection Thursday. SLP provided reassurance about her choice and that she will receive good post-sx instructions.  She was outdoors a lot this weekend and feels a lot of phlegm in throat this morning. Jillane has not been completing PhoRTE or SOVTE consistently since previous session, but has been able to complete on those days when she feels better from allergy  symptoms; Has been using Reflux Gourmet.  Vocal stamina is better than prior to ST. SOVTE completed with initial min A for incr'd pitch variation in high pitch range, rare min A throughout exercise (zh, lip trill, and tongue trill) for incr'd pitch variation. She did not clear throat once, and drank water during the session today. Pt tries to have ~80oz water or water based drinks daily.  Almira Armour and SLP agreed to at least one more visit post injection - due to scheduling, pt will arrive either 04/23/24 or 05/02/24. She and SLP will discuss the need for further ST at that appointment. Given the fact that she reports incr'd  tightness/tension when talking for a while, SLP postulates that vocal fatigue is playing a role in compression and theoretically if glottal gap is closed pt should have decr'd vocal fatigue.  03/07/24: Pt was not completing PhoRTE /a/ as prescribed. SLP used mod-max A initially and pt was independent with /a as directed by task end. The other aspects of the program were completed with good volume and pitch. Pt states she has completed BID but maybe for a few days since last session. Today her sustained /a/ was measured at 13.5 seconds.  Today, since compression was seen on ENT laryngeal exam yesterday, SLP worked with pt again on SOVTE and flow phonation tasks. Intermittent improved vocal quality observed today with just a short practice with vocal relaxation/vocal quality improvements pt and SLP agree staying at once every other week is appropriate at this time.   02/05/24: Pt completing PhoRTE mostly BID. Pt cont to maintain H2O >60 oz/daily. In 15 minute conversation today she used AB 90% success.  Pt cont with rattle intermittently throughout the day, and heard today, but maintains that her voice is definitely not tight like it was prior to this course of ST. SLP postulates rough quality heard is due to remaining glottal gap. Awaiting ENT follow up. Today she sustained /a/ 9.1 seconds, which is 2 seconds better than at eval (7.02). PhoRTE completed with min A with high pitched sentences, faded to independence x10 consecutive correct productions. Pt and SLP agreed once a week x4, following pt's ENT follow up, was warranted and can be cancelled as necessary.  01/22/24: Pt's use of relaxed voice upon entering ST room was 50-60%. Pt maintained AB 100% of the time in conversation of 15 minutes. SLP encouraged pt to perform pursed lip singing to refocus relaxed voice when she experienced more hoarseness after speaking for 20 minutes. After pt did this her voice was less hoarse and maintained for 10 minutes.  Cont to drink >60 oz H2O daily and completing PhoRTE on her own. Pt will be seen every other week at this time.   01/15/24: SLP engaged pt in 10 minutes conversation with 90% AB. Relaxed voice (less edge to pt's voice) heard 70% of the time. Pt is still drinking >60 oz water and has forgone caffeinated drinks after breakfast/morning. PhoRTE exercises completed today with  initial cue for /a/ to make an /a/ and not aw vowel. Pt independent after this. SLP and pt agreed she could be seen once every week.  01/09/24: Water average remains >60 oz, began last week. Pt arrived today with relaxed voice (almost WNL quality) 70-75% for initial conversation with SLP for 5 minutes. By session end, SLP heard almost WNL voice quality approx 50% of session. Pt performed PhoRTE with /a/ rare min A for relaxed voice (on rep #7) and not vibrating voice. Pt able to correct on next production and maintain until rep #10. With numbers, SLP encouraged pt to get higher than you tihnk you can and her range improved dramatically. She was able to maintain this production range for all remaining reps. She performed high and low sentences with initial cue only. SLP told pt she may want to consider keeping a straw in her office and use it for straw phonation 3-4-5 times a day in order to habituate relaxed voice. If pt has relaxed voice 75% of next session overall and does not require SLP cues for PhoRTE, reduction to once/week will be strongly considered.   01/07/24:Kannon saw Dr. Soldatova this morning, told SLP she could see pt was still dealing with post nasal drip. Today, pt demonstrated relaxed speech approx 60% of the time during conversation with focus on relaxed voice. With PhoRTE, pt req'd mod A usually, faded to usual min A with procedure with numbers section of PhoRTE. She will cont with PhoRTE at home, following SOVTE (straw singing).  12/31/23: Pt practiced once Friday morning, and twice Sunday and once in car coming to ST  today. Pt appears to be dealing with persistent phlegm from a URI/cold last week. Clear voice observed 70% of the time with reading sentences.  SLP introduced pt to PhoRTE today and pt req'd mod A occasionally faded to rare min A. She req'd more cues for wider pitch range with numbers and lower voice for reading sentences task. See pt instructions for details.   12/27/23: SOVTE tasks (lip or tongue trill) paired with word level responses 88% WNL voicing. SLP attempted to incr complexity to sentences read without trill however pt success decr'd to 68%. SLP added trill back again and success improved to 80%. SLP then worked towards not using trill prior to production but pt req'd use of trill for success > 80%. Homework for lip/tongue trill with words x10, then practice with lip/tongue trill with either word or phrase response x10 minutes (BID) or x15 minutes (every day).  12/25/23: SLP should check H2O intake next session. Pt ran out of Zyzal and wonders if congestion for last few days has anything to do with this as well. SOVTE completed with initial cues to start lower pitch and go to higher pitch. Mostly-WNL voicing heard after 5 reps of lip trills and 5 reps of tongue trills. Pt stated she had been focusing more on AB and feeling a relaxed voice since last session. SLP worked with pt in sentence tasks and she exhibited WNL voice 70% and mostly-WNL voice 25%. AB was used 90% of the time. Homework to practice AB and relaxed voice for at least 15 minutes BID.   12/13/23: Pt has been practicing flow phonation but not SOVTE exercises. SLP targeted SOVTE since pt has not done those. SLP used zh (measure) and pt with almost WNL voicing 90% of the time. The syllable zha was also successful as well as zha zha however zha mama was not successful. SLP went  back to zha and then also did tongue and lip trills with WNL voicing. Pt was unable to transition to any real words at this time so homework is for  zh, tongue trill and lip trills with up and down pitch.  12/11/23: Pt had half of a Coke instead of full can of Coke last night. SLP cont to encourage pt to minimize caffeine and incr water (with a decaf drink mix if necessary). Today SLP worked with pt on AB at rest. Today SLP guided pt through SOVTE with straw in water, out of water all focusing on buzz in mouth and nose. SLP then had pt work without straw with /u/, /mmmmu/. SLP drew pt's attention to a more WNL sounding voice quality without the edge to it that she had when she arrived today. Pt stated she felt the difference. Homework: see pt instructions  12/03/23 (eval): N/A  PATIENT EDUCATION: Education details: See clinical impressions below Person educated: Patient Education method: Explanation, Demonstration, Verbal cues, and Handouts Education comprehension: verbalized understanding, returned demonstration, verbal cues required, and needs further education  HOME EXERCISE PROGRAM: SOVTE, and other voice tasks at home. PhoRTE.   GOALS: Goals reviewed with patient? Yes  SHORT TERM GOALS: Target date: 01/03/24  Pt will complete exercises to reduce vocal tension (SOVTE and others) as directed at least days/week for two weeks via tracking sheet  Baseline: Goal status: met  2.  Pt will demo improved voice in sentence responses 70% in 3 sessions Baseline:  Goal status: Partially met  3.  Pt will achieve AB in sentence responses 70% in 3 sessions Baseline: 12/25/23, 12/31/23 Goal status: Met  4.  Pt will complete PhoRTE exercises with occasional min mod A Baseline:  Goal status: not met  5.  Pt will incr water intake to at least 48 oz/day at least 5 days in a 7 day period, as monitored by tracking sheet Baseline:  Goal status: Met   LONG TERM GOALS: Target date: 02/22/24, 04/18/24  Pt will improve PROM Baseline:  Goal status: INITIAL  2.  Pt will decr s/z ratio to <1.0 Baseline:  Goal status: INITIAL  3.  Pt will  complete exercises to improve voice/decr glottal gap as directed at least 6 days/week for two weeks via tracking sheet  Baseline:  Goal status: met  4.  Pt will incr water intake to at least 60 oz/day at least 5 days in a 7 day period, as monitored by tracking sheet Baseline:  Goal status: met  5  Pt will demo improved voice in 10 minutes conversation in 3 sessions Baseline: 01/15/24, 01/22/24 Goal status: met  6.  Pt will achieve AB to at least 70% in 10 minutes conversation in 3 sessions Baseline: 01/15/24, 01/22/24 Goal status: Met  7. Pt will report better vocal stamina (greater than one hour) between 2 sessions Baseline: 03/31/24 Goal status: met  ASSESSMENT:  CLINICAL IMPRESSION: RECERT today. Patient is a 64 y.o. 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 She had injection on 04/11/24 and has seen improvement however also tells SLP amount of sinus congestion affects quality of voice. See treatment date for more details. Pt continues highly motivated and based upon her willingness to participate in ST, she remains a good candidate for skilled ST targeting improved vocal quality.  OBJECTIVE IMPAIRMENTS: include voice disorder. These impairments are limiting patient from return to work, household responsibilities, ADLs/IADLs, and effectively communicating at home and in community. Factors affecting potential to achieve goals and functional outcome are severity of impairments.. Patient will benefit from skilled SLP services to address above impairments and improve overall function.  REHAB POTENTIAL: Good  PLAN:  SLP FREQUENCY: every other week  SLP DURATION: no more than 3  sessions  PLANNED INTERVENTIONS: Environmental controls, Internal/external aids, Functional tasks,  SLP instruction and feedback, Compensatory strategies, Patient/family education, [phone] Treatment of speech (30 or 45 min) , and voice exercises    Geoffrey Mankin, CCC-SLP 05/02/2024, 8:51 AM

## 2024-05-06 ENCOUNTER — Telehealth: Payer: Self-pay

## 2024-05-06 DIAGNOSIS — E119 Type 2 diabetes mellitus without complications: Secondary | ICD-10-CM

## 2024-05-06 MED ORDER — SEMAGLUTIDE(0.25 OR 0.5MG/DOS) 2 MG/3ML ~~LOC~~ SOPN: 0.5000 mg | PEN_INJECTOR | SUBCUTANEOUS | 1 refills | Status: DC

## 2024-05-06 NOTE — Telephone Encounter (Signed)
 Copied from CRM [id number]. Topic: Clinical - Prescription Issue >> May 06, 2024 10:24 AM Tiffany H wrote: Reason for CRM: 1). Patient advised that she removed evening dosage of Metformin  2 weeks ago to curb nausea. Patient's husband has been a diabetic for many years and hypothesized that Metformin  was making patient nauseated. Once Metformin  was removed, patient felt better and numbers have not destabilized. She would like to discontinue Metformin  while she is on Ozempic  due to persistent nausea.   2). Next refill titrates patient up to 0.5mg , for which she has 4 weeks of pens, but next titrate from 0.5mg  up will need to be sent to the following pharamacy due to [facility] no longer sending out Ozempic :   Semaglutide ,0.25 or 0.5MG /DOS, 2 MG/3ML [facility] [[id number]]  [id number] [place] [place] - [address] [place], [place] [address] Phone: [phone]  Fax: [phone] DEA #: [id number] DAW Reason: --  3). Patient scheduled first Ozempic  follow up for 05/26/24. Patient was supposed to be seen in May but had not yet started medication. Patient has already titrate up from 0.25mg  to 0.5mg  without follow up. Please advise if patient needs to be seen sooner than 05/26/24.

## 2024-05-06 NOTE — Telephone Encounter (Signed)
 Called patient please review when patient calls back.

## 2024-05-06 NOTE — Addendum Note (Signed)
 Addended by: LAVELL LYE A on: 05/06/2024 04:32 PM   Modules accepted: Orders

## 2024-05-06 NOTE — Telephone Encounter (Signed)
 Ok to hold metformin . Ozempic  Prescription sent to pharmacy.

## 2024-05-06 NOTE — Telephone Encounter (Signed)
 Pt returning call, advised [facility]. Pt informed by [facility] should go elsewhere as they do not fill the medication.   Requesting rx for 0.5mg  to be sent [id number] [place] [place] - [address] [place], [place] [address] Phone: [phone]  Fax: [phone] DEA #: [id number]

## 2024-05-07 ENCOUNTER — Ambulatory Visit (INDEPENDENT_AMBULATORY_CARE_PROVIDER_SITE_OTHER): Admitting: Otolaryngology

## 2024-05-07 ENCOUNTER — Other Ambulatory Visit: Payer: Self-pay | Admitting: *Deleted

## 2024-05-07 ENCOUNTER — Encounter (INDEPENDENT_AMBULATORY_CARE_PROVIDER_SITE_OTHER): Payer: Self-pay | Admitting: Otolaryngology

## 2024-05-07 VITALS — BP 110/72 | HR 92

## 2024-05-07 DIAGNOSIS — J3089 Other allergic rhinitis: Secondary | ICD-10-CM

## 2024-05-07 DIAGNOSIS — K219 Gastro-esophageal reflux disease without esophagitis: Secondary | ICD-10-CM | POA: Diagnosis not present

## 2024-05-07 DIAGNOSIS — R0982 Postnasal drip: Secondary | ICD-10-CM | POA: Diagnosis not present

## 2024-05-07 DIAGNOSIS — J45909 Unspecified asthma, uncomplicated: Secondary | ICD-10-CM

## 2024-05-07 DIAGNOSIS — R490 Dysphonia: Secondary | ICD-10-CM

## 2024-05-07 DIAGNOSIS — E119 Type 2 diabetes mellitus without complications: Secondary | ICD-10-CM

## 2024-05-07 DIAGNOSIS — K200 Eosinophilic esophagitis: Secondary | ICD-10-CM

## 2024-05-07 DIAGNOSIS — J383 Other diseases of vocal cords: Secondary | ICD-10-CM | POA: Diagnosis not present

## 2024-05-07 MED ORDER — SEMAGLUTIDE(0.25 OR 0.5MG/DOS) 2 MG/3ML ~~LOC~~ SOPN: 0.5000 mg | PEN_INJECTOR | SUBCUTANEOUS | 1 refills | Status: DC

## 2024-05-07 NOTE — Telephone Encounter (Signed)
 Redirected [facility] [place].  Contacted patient and notified.

## 2024-05-07 NOTE — Patient Instructions (Signed)
 Lloyd Huger Med Nasal Saline Rinse   - start nasal saline rinses with NeilMed Bottle available over the counter or online to help with nasal congestion

## 2024-05-07 NOTE — Progress Notes (Signed)
 ENT Progress Note:   Update 05/07/2024  Discussed the use of AI scribe software for clinical note transcription with the patient, who gave verbal consent to proceed.  History of Present Illness  Carla Morales is a 63 year old female who presents 3 weeks after bilateral VF injection augmentation (04/11/24) with Restylane.   Her voice has improved in terms of projection and loudness, but she continues to experience significant post-nasal drainage, which she believes affects her voice. She is currently taking Xyzal in the mornings and using nasal sprays to manage her symptoms. She has not tried nasal rinses with salt water before.  She manages reflux symptoms with Dexilant , which appears to be effective. She takes reflux gourmet.  She is actively involved in her community, helping with food at a [employer] event, which takes place in an air-conditioned environment, minimizing discomfort from the heat.  Last office visit before Procedure History of Present Illness Carla Morales is a 63 year old female with hx of chronic dysphonia, GERD, environmental allergies and post-nasal drainage, asthma, who presents for follow-up after voice therapy and reflux management.  She has been attending sessions with a speech therapist, which have been somewhat beneficial, especially during exercises and home practice. However, she experiences regression in progress after speaking for extended periods, such as an hour.  She is currently taking Dexilant  for reflux and using Reflux Gourmet. For postnasal drainage, she is on Dupixent , Dulera , Flonase , Astelin , and ipratropium bromide . She estimates a 50% improvement in her symptoms with these treatments.  Her voice quality is clearer in the late morning, while early mornings are less favorable. She uses her inhaler in the morning.  She continues to experience postnasal drainage and reflux changes, which have been persistent issues.   She is not on  any blood thinners but takes vitamin E and omega-3 supplements  Initial Evaluation   Discussed the use of AI scribe software for clinical note transcription with the patient, who gave verbal consent to proceed.  History of Present Illness   Carla Morales is a 63 year old female with hx of GERD, eosinophilic esophagitis/dysphonia who presents for follow-up after voice therapy.  She is undergoing voice therapy, initially set up for six weeks with sessions twice a week for four weeks and once a week thereafter. She has not been able to attend twice weekly sessions consistently. She experiences improvement during the sessions, although the benefits do not persist.  She experiences nasal congestion and post-nasal drainage that settles in her throat. She is currently taking Dexilant  for reflux but has not started the reflux gourmet supplement discussed previously. She continues to use Dupixent , Dulera , Flonase , Astelin , and ipratropium bromide  (Atrovent ).  She has a history of eosinophilic esophagitis and has been managing her symptoms by decreasing certain foods, increasing water intake, and reducing caffeine consumption to low levels in the morning.  She experiences occasional ulcers on the roof of her mouth that resolve spontaneously. She has started using Vaseline and saline sprays to help with dryness in her nose, especially since the heating is on and the air is dry.  She is using a humidifier at home and work to help with her symptoms.   Initial Evaluation  Reason for Consult: ulcer on the roof of her mouth   HPI: Discussed the use of AI scribe software for clinical note transcription with the patient, who gave verbal consent to proceed.  History of Present Illness   The patient is a 62 yoF,  with a history of asthma, gastroesophageal reflux disease (GERD), and eosinophilic esophagitis (EOE), presents with a chief complaint of dysphonia/voice changes, throat discomfort an a painful  mouth ulcer she developed over the roof of her mouth, which has since resolved. The mouth ulcer was located on the roof of the mouth and was extremely sensitive to acidic and spicy foods. The patient also reports a persistent hoarseness for the past year and a half, which predates the initiation of Dupixent  for EOE.  The patient has been on multiple medications, including Dupixent , Dulera , Flonase , and Astelin , for the management of asthma/environmental allergies and EOE. They have had recurrent episodes of thrush, which have been more frequent before the initiation of Dulera . The patient also reports a history of non-stop coughing, which has been managed with prednisone  and was thought to be related to uncontrolled asthma.  The patient also reports pain in the back of the throat, particularly when talking a lot or attempting to sing. This pain is described as different from a typical sore throat and does not seem to be related to postnasal drainage. The patient also reports sensitivity on the edges of the tongue to acidic foods.  The patient has been diagnosed with EOE and GERD, and is currently on Dexilant  for the management of these conditions. They have undergone an upper endoscopy twice, with the most recent one showing no significant changes. The patient has not made any significant dietary changes, but has reduced the intake of soft drinks. She never tried elimination diet for EoE.      Past Medical History:  Diagnosis Date   Adenomatous colon polyp    Allergy     Arthritis    Asthma    Chondrocalcinosis    COVID-19    Diverticulosis    Elevated alkaline phosphatase level    Elevated antinuclear antibody (ANA) level    Eosinophilic esophagitis    Esophageal dysmotility    Esophageal stenosis    Fatty liver    GERD (gastroesophageal reflux disease)    Hyperlipidemia    Internal hemorrhoids    Migraine    Osteoarthritis    Pinched nerve in neck    Schatzki's ring    Sinus  congestion    Urinary incontinence     Past Surgical History:  Procedure Laterality Date   ABDOMINAL HYSTERECTOMY     bladder dialation  1968   as a child   COLONOSCOPY     TONSILLECTOMY  1971   TOTAL VAGINAL HYSTERECTOMY  07/2010   menorrhagia, leiomyoma, prolapse   TUBAL LIGATION  1998    Family History  Problem Relation Age of Onset   Allergic rhinitis Mother    Asthma Mother    Rheum arthritis Mother    Fibromyalgia Mother    Cancer Mother        breast, uterine, adenocarcinoma-right lung   Uterine cancer Mother        Sarcoma   Lung cancer Mother        Adenocarcinoma   Breast cancer Mother 21   Hypertension Father    Melanoma Father 64   Asthma Sister    Asthma Maternal Aunt    Diabetes Maternal Aunt    Cancer Maternal Aunt 61       GYN cancer - possibly vulvar?   Lung cancer Maternal Aunt    Diabetes Maternal Uncle    Appendicitis Maternal Uncle        d. 12   Diabetes Paternal Aunt  Breast cancer Paternal Aunt 52   Breast cancer Paternal Aunt 70   Uterine cancer Paternal Aunt 3   Breast cancer Paternal Aunt 75   Diabetes Paternal Uncle    Other Maternal Grandmother 30       childbirth   Heart disease Maternal Grandfather    Cancer Paternal Grandmother        unknown   Other Daughter        Phyllodes tumor   Other Nephew        Gilbert's Disease   Colon cancer Neg Hx    Esophageal cancer Neg Hx    Ulcerative colitis Neg Hx    Stomach cancer Neg Hx    Pancreatic cancer Neg Hx     Social History:  reports that she has never smoked. She has never been exposed to tobacco smoke. She has never used smokeless tobacco. She reports that she does not drink alcohol and does not use drugs.  Allergies:  Allergies  Allergen Reactions   Other Swelling    Contrast dye   Contrast Media [Iodinated Contrast Media]    Dust Mite Extract    Haldol [Haloperidol Decanoate]     anxiety   Molds & Smuts    Pravastatin  Other (See Comments)    myalgias   Sulfa  Antibiotics Rash    Medications: I have reviewed the patient's current medications.  The PMH, PSH, Medications, Allergies, and SH were reviewed and updated.  ROS: Constitutional: Negative for fever, weight loss and weight gain. Cardiovascular: Negative for chest pain and dyspnea on exertion. Respiratory: Is not experiencing shortness of breath at rest. Gastrointestinal: Negative for nausea and vomiting. Neurological: Negative for headaches. Psychiatric: The patient is not nervous/anxious  Blood pressure 110/72, pulse 92, last menstrual period 07/24/2010, SpO2 96%.  PHYSICAL EXAM:  Exam: Blood pressure 110/72, pulse 92, last menstrual period 07/24/2010, SpO2 96%. There is no height or weight on file to calculate BMI.  PHYSICAL EXAM:  Exam: General: Well-developed, well-nourished Communication and Voice: good projection  Respiratory Respiratory effort: Equal inspiration and expiration without stridor Cardiovascular Peripheral Vascular: Warm extremities with equal color/perfusion Eyes: No nystagmus with equal extraocular motion bilaterally Neuro/Psych/Balance: Patient oriented to person, place, and time; Appropriate mood and affect; Gait is intact with no imbalance; Cranial nerves I-XII are intact Head and Face Inspection: Normocephalic and atraumatic without mass or lesion Palpation: Facial skeleton intact without bony stepoffs Salivary Glands: No mass or tenderness Facial Strength: Facial motility symmetric and full bilaterally ENT Pinna: External ear intact and fully developed External canal: Canal is patent with intact skin Tympanic Membrane: Clear and mobile External Nose: No scar or anatomic deformity Internal Nose: Septum is relatively straight. No polyp, or purulence. Mucosal edema and erythema present.  Bilateral inferior turbinate hypertrophy.  Lips, Teeth, and gums: Mucosa and teeth intact and viable TMJ: No pain to palpation with full mobility Oral  cavity/oropharynx: No erythema or exudate, no lesions present Nasopharynx: No mass or lesion with intact mucosa Hypopharynx: Intact mucosa without pooling of secretions Larynx Glottic: Full true vocal cord mobility without lesion or mass VF atrophy and glottic insufficiency supraglottic compression with phonation Supraglottic: Normal appearing epiglottis and AE folds Mucosal wave intact complete glottic closure Interarytenoid Space: Moderate to severe pachydermia&edema Subglottic Space: Patent without lesion or edema Neck Neck and Trachea: Midline trachea without mass or lesion Thyroid : No mass or nodularity Lymphatics: No lymphadenopathy   Preoperative diagnosis: hoarseness VF atrophy s/p injection augmentation  Postoperative diagnosis:   same  Procedure: Flexible fiberoptic laryngoscopy with stroboscopy (68420)   Surgeon: Elena Larry, MD  Anesthesia: Topical lidocaine  and Afrin  Complications: None  Condition is stable throughout exam  Indications and consent:   The patient presents to the clinic with hoarseness. All the risks, benefits, and potential complications were reviewed with the patient preoperatively and informed verbal consent was obtained.  Procedure: The patient was seated upright in the exam chair.   Topical lidocaine  and Afrin were applied to the nasal cavity. After adequate anesthesia had occurred, the flexible telescope with strobe capabilities was passed into the nasal cavity. The nasopharynx was patent without mass or lesion. The scope was passed behind the soft palate and directed toward the base of tongue. The base of tongue was visualized and was symmetric with no apparent masses or abnormal appearing tissue. There were no signs of a mass or pooling of secretions in the piriform sinuses. The supraglottic structures were normal.  The true vocal cords are mobile. The medial edges were straight. Closure was complete. Periodicity present. The mucosal wave and  amplitude were intact and normal. There is moderate interarytenoid pachydermia and post cricoid edema. The mucosa appears without lesions.   The laryngoscope was then slowly withdrawn and the patient tolerated the procedure well. There were no complications or blood loss.    Studies Reviewed: CXR 04/21/23 FINDINGS: The heart size and mediastinal contours are within normal limits. Both lungs are clear. The visualized skeletal structures are unremarkable.   IMPRESSION: No active cardiopulmonary disease  Esophagram 11/02/23EXAM: ESOPHAGUS/BARIUM SWALLOW/TABLET STUDY   TECHNIQUE: A combined double and single contrast examination was performed using effervescent crystals, high-density barium, and thin liquid barium.   FLUOROSCOPY: Fluoroscopy time: 2 minutes, 30 seconds (22.1 mGy).   COMPARISON:  No pertinent prior exams available for comparison.   FINDINGS: Fluoroscopic evaluation demonstrates normal caliber and smooth contour of the esophagus. No evidence of fixed stricture, mass or mucosal abnormality.   Mild intermittent esophageal dysmotility with tertiary contractions.   No appreciable hiatal hernia.   No gastroesophageal reflux was observed.   The patient swallowed a 13 mm barium tablet, which freely passed into the stomach.   The exam was performed by Warren Dais, NP , and was supervised and interpreted by Dr. Rockey Childs.   IMPRESSION: Mild esophageal dysmotility.   Otherwise unremarkable esophagram, as described.      Assessment/Plan: Encounter Diagnoses  Name Primary?   Glottic insufficiency Yes   Dysphonia    Age-related vocal fold atrophy    Eosinophilic esophagitis    Chronic GERD    Environmental and seasonal allergies    Post-nasal drip        Assessment and Plan    Chronic dysphonia and Voice Fatigue Recurrent hoarseness and throat pain, exacerbated by talking and singing. Vocal folds appear thin with incomplete closure and mild  edema on videostrobe today. No evidence of tumor or mass/no lesions mucosal wave intact. She had significant supraglottic compression and I believe there is an element of muscle tension which might cause throat discomfort/voice fatigue over time.  Discussed voice therapy as a first-line treatment to optimize breath support and reduce symptoms. Potential future interventions include vocal fold filler injection augmentation vs trial of Botox if voice therapy is ineffective and repeat exam will be c/w laryngeal spasm.  - Refer to voice therapy - Optimize reflux control - Continue current management of postnasal drainage - Consider elimination diet for EoE  - trial of Reflux Gourmet  Gastroesophageal Reflux Disease (GERD)  GERD managed with Dexilant . Symptoms include throat pain and possible contribution to voice changes. Discussed optimizing reflux control through dietary adjustments and natural supplements. - Continue Dexilant  at current dose - Optimize reflux control through dietary adjustments and trial of reflux gourmet  Eosinophilic Esophagitis (EOE) EOE managed with Dupixent  and Dexilant . No significant improvement noted on recent endoscopy per report. No formal elimination diet in the past. Discussed the six-food elimination diet to identify specific triggers and improve symptoms. - Provide information on six-food elimination diet - Encourage dietary adjustments to identify specific triggers  Asthma and Environmental Allergies Post-nasal drainage Nasal endoscopy today with S-shaped septum and septal deviation but no evidence of polyps or no pus to suggest sinusitis  Asthma with episodes of uncontrolled symptoms requiring increased medication. Currently on Dupixent , Dulera , Flonase /Asteline/Ipratropium Bromide . Discussed the importance of rinsing mouth after using inhalers to prevent thrush. - Continue current asthma medications (Dupixent , Dulera ) - Monitor for symptom control and adjust  medications as needed - continue Flonase /Asteline/Ipratropium Bromide  daily 2 puffs b/l nares  - continue Xyzal 5 mg daily   Recurrent Thrush - no evidence of thrush on exam today Recurrent thrush associated with steroid use for asthma. No current evidence of thrush on examination. Discussed the importance of rinsing mouth after using inhalers to prevent recurrence. 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 No current ulceration or significant symptoms noted. Discussed monitoring for any changes or recurrence of symptoms and potential biopsy if ulceration recurs. - Monitor for any changes or recurrence of symptoms - Consider biopsy if ulceration recurs  Follow-up - Schedule follow-up appointment after voice therapy - Provide handout on elimination diet.      Update last OV Assessment and Plan    Chronic dysphonia and VF atrophy/glottic insufficiency on scope exam during initial evaluation  Undergoing voice therapy with noted improvement, but still has raspy voice at times. Persistent nasal congestion and postnasal drainage contributing to symptoms Discussed benefits of Reflux Gourmet in addition to Dexilant  to prevent reflux, especially when lying down. Reflux Diet.  - Continue voice therapy - Use Reflux Gourmet supplement after meals, especially after dinner - continue Dexilant  at current dose - Follow up in two months for re-evaluation and repeat scope exam  - will consider injection augmentation when she returns if voice is not better  Chronic nasal congestion and post-nasal drainage hx of asthma Using multiple medications including Dupixent , Dulera , Flonase , Astelin , and ipratropium bromide . Discussed use of Vaseline and saline sprays to alleviate dryness. - Continue current  medications (Dupixent , Dulera , Flonase , Astelin , ipratropium bromide ) - Use Vaseline and saline sprays - Use humidifier at home and work  Eosinophilic Esophagitis Attempting elimination diet, reducing caffeine intake, and increasing water consumption. Noted improvement in symptoms with dietary changes. - Continue elimination diet - Continue reducing caffeine intake - Maintain increased water consumption - will consider GI referral for repeat EGD   Follow-up - Schedule follow-up appointment in two months for reassessment and repeat strobe exam   Update 05/07/2024  Assessment & Plan Dysphonia, chronic, Vocal fold atrophy and glottic insufficiency s/p injection augmentation 04/11/24 She has done well and achieved improved sound projection and closure, we discussed that effects will last 6-12 months.  - RTC 9 months for repeat scope exam  Chronic nasal congestion Environmental Allergies  Postnasal drip Chronic postnasal drip with ongoing symptoms despite current treatment. Improvement noted at approximately 50% with current therapy. - Continue current medications: Dupixent , Dulera , Flonase , Astelin , ipratropium bromide .  Gastroesophageal reflux disease LPR Chronic gastroesophageal reflux disease with ongoing symptoms. Reflux changes observed during scope exam.. Discussed importance of continued reflux management to prevent further vocal irritation and raspiness. - Continue current medications: Dexilant  and Reflux Gourmet. - Encourage her to visit Jamie Kaufman's website for additional reflux management strategies.    Elena Larry, MD Otolaryngology [facility] Phone: [phone] Fax: [phone]  05/07/2024, 3:48 PM

## 2024-05-14 ENCOUNTER — Other Ambulatory Visit: Payer: Self-pay | Admitting: Internal Medicine

## 2024-05-20 ENCOUNTER — Ambulatory Visit

## 2024-05-21 ENCOUNTER — Telehealth: Payer: Self-pay | Admitting: Internal Medicine

## 2024-05-21 MED ORDER — DEXLANSOPRAZOLE 60 MG PO CPDR: 1.0000 | DELAYED_RELEASE_CAPSULE | Freq: Every day | ORAL | 0 refills | Status: DC

## 2024-05-21 NOTE — Telephone Encounter (Signed)
 Inbound call from patient, requesting [facility]  [facility]

## 2024-05-21 NOTE — Telephone Encounter (Signed)
 [facility] [place] per patient's request. Patient has appt scheduled with Ellouise Console, PA on 07/15/24 for f/u and continuation of medication.

## 2024-05-26 ENCOUNTER — Encounter: Payer: Self-pay | Admitting: Family

## 2024-05-26 ENCOUNTER — Ambulatory Visit: Admitting: Family

## 2024-05-26 ENCOUNTER — Other Ambulatory Visit: Payer: Self-pay | Admitting: Internal Medicine

## 2024-05-26 VITALS — BP 116/63 | HR 80 | Temp 98.7°F | Ht 65.0 in | Wt 179.4 lb

## 2024-05-26 DIAGNOSIS — E119 Type 2 diabetes mellitus without complications: Secondary | ICD-10-CM | POA: Insufficient documentation

## 2024-05-26 DIAGNOSIS — K219 Gastro-esophageal reflux disease without esophagitis: Secondary | ICD-10-CM | POA: Diagnosis not present

## 2024-05-26 DIAGNOSIS — F411 Generalized anxiety disorder: Secondary | ICD-10-CM

## 2024-05-26 DIAGNOSIS — Z6829 Body mass index (BMI) 29.0-29.9, adult: Secondary | ICD-10-CM

## 2024-05-26 DIAGNOSIS — J4530 Mild persistent asthma, uncomplicated: Secondary | ICD-10-CM | POA: Diagnosis not present

## 2024-05-26 DIAGNOSIS — E1169 Type 2 diabetes mellitus with other specified complication: Secondary | ICD-10-CM | POA: Diagnosis not present

## 2024-05-26 DIAGNOSIS — E785 Hyperlipidemia, unspecified: Secondary | ICD-10-CM

## 2024-05-26 DIAGNOSIS — E669 Obesity, unspecified: Secondary | ICD-10-CM | POA: Diagnosis not present

## 2024-05-26 LAB — BAYER DCA HB A1C WAIVED: HB A1C (BAYER DCA - WAIVED): 5.6 % (ref 4.8–5.6)

## 2024-05-26 MED ORDER — SEMAGLUTIDE (1 MG/DOSE) 4 MG/3ML ~~LOC~~ SOPN
1.0000 mg | PEN_INJECTOR | SUBCUTANEOUS | 2 refills | Status: DC
Start: 2024-05-26 — End: 2024-08-12

## 2024-05-26 NOTE — Patient Instructions (Signed)

## 2024-05-26 NOTE — Progress Notes (Signed)
 Subjective:    Patient ID: Carla Morales, female    DOB: January 31, 1961, 63 y.o.   MRN: [id number]  Chief Complaint  Patient presents with   Medical Management of Chronic Issues   Pt presents to the office today for chronic follow up. She is followed by GYN annually.    She is followed by GI for GERD and EOE.   She is followed by ENT for hoarse voice.   Also, asthma and allergen every 6 months. Her breathing is controlled.   She is currently Ozempic  0.5 mg. Her starting weight was 195 lb. She has lost 16 lbs.      05/26/2024    1:47 PM 03/06/2024    3:14 PM 01/24/2024    8:15 AM  Last 3 Weights  Weight (lbs) 179 lb 6.4 oz 195 lb 6.4 oz 195 lb 3.2 oz  Weight (kg) 81.375 kg 88.633 kg 88.542 kg     Asthma This is a chronic problem. The current episode started more than 1 year ago. The problem occurs intermittently. Associated symptoms include heartburn. Her symptoms are alleviated by rest and beta-agonist. She reports moderate improvement on treatment. Her past medical history is significant for asthma.  Gastroesophageal Reflux She complains of belching and heartburn. This is a chronic problem. The current episode started more than 1 year ago. The problem occurs occasionally. The symptoms are aggravated by certain foods. Risk factors include obesity. She has tried a PPI for the symptoms. The treatment provided moderate relief.  Hyperlipidemia This is a chronic problem. The current episode started more than 1 year ago. The problem is controlled. Recent lipid tests were reviewed and are normal. Exacerbating diseases include obesity. Current antihyperlipidemic treatment includes statins. The current treatment provides moderate improvement of lipids. Risk factors for coronary artery disease include dyslipidemia, hypertension, a sedentary lifestyle and post-menopausal.  Anxiety Presents for follow-up visit. Symptoms include decreased concentration, excessive worry, irritability,  nervous/anxious behavior and restlessness. Symptoms occur most days. The severity of symptoms is moderate.   Her past medical history is significant for asthma.  Diabetes She presents for her follow-up diabetic visit. She has type 2 diabetes mellitus. Hypoglycemia symptoms include nervousness/anxiousness. Associated symptoms include blurred vision. Pertinent negatives for diabetes include no foot paresthesias. Risk factors for coronary artery disease include dyslipidemia, diabetes mellitus and hypertension. She is following a generally healthy diet. Her overall blood glucose range is 90-110 mg/dl. (Taking it once a week )      Review of Systems  Constitutional:  Positive for irritability.  Eyes:  Positive for blurred vision.  Gastrointestinal:  Positive for heartburn.  Psychiatric/Behavioral:  Positive for decreased concentration. The patient is nervous/anxious.   All other systems reviewed and are negative.  Family History  Problem Relation Age of Onset   Allergic rhinitis Mother    Asthma Mother    Rheum arthritis Mother    Fibromyalgia Mother    Cancer Mother        breast, uterine, adenocarcinoma-right lung   Uterine cancer Mother        Sarcoma   Lung cancer Mother        Adenocarcinoma   Breast cancer Mother 52   Hypertension Father    Melanoma Father 65   Asthma Sister    Asthma Maternal Aunt    Diabetes Maternal Aunt    Cancer Maternal Aunt 68       GYN cancer - possibly vulvar?   Lung cancer Maternal Aunt    Diabetes  Maternal Uncle    Appendicitis Maternal Uncle        d. 12   Diabetes Paternal Aunt    Breast cancer Paternal Aunt 43   Breast cancer Paternal Aunt 24   Uterine cancer Paternal Aunt 48   Breast cancer Paternal Aunt 42   Diabetes Paternal Uncle    Other Maternal Grandmother 30       childbirth   Heart disease Maternal Grandfather    Cancer Paternal Grandmother        unknown   Other Daughter        Phyllodes tumor   Other Nephew         Gilbert's Disease   Colon cancer Neg Hx    Esophageal cancer Neg Hx    Ulcerative colitis Neg Hx    Stomach cancer Neg Hx    Pancreatic cancer Neg Hx    Social History   Socioeconomic History   Marital status: Married    Spouse name: Not on file   Number of children: 1   Years of education: Not on file   Highest education level: Not on file  Occupational History   Not on file  Tobacco Use   Smoking status: Never    Passive exposure: Never   Smokeless tobacco: Never  Vaping Use   Vaping status: Never Used  Substance and Sexual Activity   Alcohol use: No    Alcohol/week: 0.0 standard drinks of alcohol   Drug use: No   Sexual activity: Yes    Partners: Male    Birth control/protection: Surgical    Comment: hyst.-1st intercourse 19 yo-1 partner, menarche 16yo  Other Topics Concern   Not on file  Social History Narrative   Not on file   Social Drivers of Health   Financial Resource Strain: Not on file  Food Insecurity: Not on file  Transportation Needs: Not on file  Physical Activity: Not on file  Stress: Not on file  Social Connections: Not on file        Objective:   Physical Exam Vitals reviewed.  Constitutional:      General: She is not in acute distress.    Appearance: She is well-developed. She is obese.  HENT:     Head: Normocephalic and atraumatic.     Right Ear: Tympanic membrane normal.     Left Ear: Tympanic membrane normal.  Eyes:     Pupils: Pupils are equal, round, and reactive to light.  Neck:     Thyroid : No thyromegaly.  Cardiovascular:     Rate and Rhythm: Normal rate and regular rhythm.     Heart sounds: Normal heart sounds. No murmur heard. Pulmonary:     Effort: Pulmonary effort is normal. No respiratory distress.     Breath sounds: Normal breath sounds. No wheezing.  Abdominal:     General: Bowel sounds are normal. There is no distension.     Palpations: Abdomen is soft.     Tenderness: There is no abdominal tenderness.   Musculoskeletal:        General: No tenderness. Normal range of motion.     Cervical back: Normal range of motion and neck supple.  Skin:    General: Skin is warm and dry.  Neurological:     Mental Status: She is alert and oriented to person, place, and time.     Cranial Nerves: No cranial nerve deficit.     Deep Tendon Reflexes: Reflexes are normal and symmetric.  Psychiatric:  Behavior: Behavior normal.        Thought Content: Thought content normal.        Judgment: Judgment normal.       BP 116/63   Pulse 80   Temp 98.7 F (37.1 C) (Temporal)   Ht 5' 5 (1.651 m)   Wt 179 lb 6.4 oz (81.4 kg)   LMP 07/24/2010   BMI 29.85 kg/m      Assessment & Plan:  Carla Morales comes in today with chief complaint of Medical Management of Chronic Issues   Diagnosis and orders addressed:  1. Hyperlipidemia associated with type 2 diabetes mellitus (HCC) - Bayer DCA Hb A1c Waived  2. Obesity (BMI 30-39.9) - Bayer DCA Hb A1c Waived  3. Gastroesophageal reflux disease, unspecified whether esophagitis present - Bayer DCA Hb A1c Waived  4. Mild persistent asthma without complication  - Bayer DCA Hb A1c Waived  5. GAD (generalized anxiety disorder) - Bayer DCA Hb A1c Waived  6. Type 2 diabetes mellitus with other specified complication, without long-term current use of insulin (HCC) (Primary) - Semaglutide , 1 MG/DOSE, 4 MG/3ML SOPN; Inject 1 mg as directed once a week.  Dispense: 3 mL; Refill: 2 - CMP14+EGFR - Bayer DCA Hb A1c Waived   Labs pending Continue to force fluids Will stop metformin  today Increase Ozempic  to 1 mg from 0.5 mg Stress management  Health Maintenance reviewed Diet and exercise encouraged  Follow up plan: 3 months    Bari Learn, FNP

## 2024-05-27 ENCOUNTER — Ambulatory Visit: Payer: Self-pay | Admitting: Family

## 2024-05-27 LAB — CMP14+EGFR
ALT: 21 IU/L (ref 0–32)
AST: 17 IU/L (ref 0–40)
Albumin: 4.2 g/dL (ref 3.8–4.9)
Alkaline Phosphatase: 152 IU/L — ABNORMAL HIGH (ref 44–121)
BUN/Creatinine Ratio: 14 (ref 12–28)
BUN: 11 mg/dL (ref 8–27)
Bilirubin Total: 0.3 mg/dL (ref 0.0–1.2)
CO2: 20 mmol/L (ref 20–29)
Calcium: 10 mg/dL (ref 8.7–10.3)
Chloride: 103 mmol/L (ref 96–106)
Creatinine, Ser: 0.76 mg/dL (ref 0.57–1.00)
Globulin, Total: 2.6 g/dL (ref 1.5–4.5)
Glucose: 86 mg/dL (ref 70–99)
Potassium: 3.9 mmol/L (ref 3.5–5.2)
Sodium: 139 mmol/L (ref 134–144)
Total Protein: 6.8 g/dL (ref 6.0–8.5)
eGFR: 90 mL/min/1.73 (ref >59)

## 2024-06-03 ENCOUNTER — Other Ambulatory Visit: Payer: Self-pay | Admitting: Family

## 2024-06-03 DIAGNOSIS — E7800 Pure hypercholesterolemia, unspecified: Secondary | ICD-10-CM

## 2024-06-06 DIAGNOSIS — B3731 Acute candidiasis of vulva and vagina: Secondary | ICD-10-CM | POA: Diagnosis not present

## 2024-06-06 DIAGNOSIS — J0140 Acute pansinusitis, unspecified: Secondary | ICD-10-CM | POA: Diagnosis not present

## 2024-06-06 DIAGNOSIS — Z8709 Personal history of other diseases of the respiratory system: Secondary | ICD-10-CM | POA: Diagnosis not present

## 2024-06-06 DIAGNOSIS — H1032 Unspecified acute conjunctivitis, left eye: Secondary | ICD-10-CM | POA: Diagnosis not present

## 2024-06-19 ENCOUNTER — Ambulatory Visit: Payer: Self-pay

## 2024-06-19 ENCOUNTER — Encounter: Payer: Self-pay | Admitting: Family Medicine

## 2024-06-19 ENCOUNTER — Ambulatory Visit: Admitting: Family Medicine

## 2024-06-19 VITALS — BP 120/71 | HR 76 | Temp 97.5°F | Ht 65.0 in | Wt 175.0 lb

## 2024-06-19 DIAGNOSIS — Z8742 Personal history of other diseases of the female genital tract: Secondary | ICD-10-CM | POA: Diagnosis not present

## 2024-06-19 DIAGNOSIS — H6691 Otitis media, unspecified, right ear: Secondary | ICD-10-CM | POA: Diagnosis not present

## 2024-06-19 MED ORDER — FLUCONAZOLE 150 MG PO TABS: 150.0000 mg | ORAL_TABLET | Freq: Once | ORAL | 1 refills | Status: AC

## 2024-06-19 MED ORDER — CEFDINIR 300 MG PO CAPS: 300.0000 mg | ORAL_CAPSULE | Freq: Two times a day (BID) | ORAL | 0 refills | Status: DC

## 2024-06-19 NOTE — Telephone Encounter (Signed)
 Noted, will be addressed at appt today.

## 2024-06-19 NOTE — Telephone Encounter (Signed)
 FYI Only or Action Required?: FYI only for provider.  Patient was last seen in primary care on 05/26/2024 by Lavell Bari LABOR, FNP.  Called Nurse Triage reporting No chief complaint on file..  Symptoms began several weeks ago.  Interventions attempted: Prescription medications: Augmentin .  Symptoms are: unchanged.  Triage Disposition: See Physician Within 24 Hours  Patient/caregiver understands and will follow disposition?: Yes  Copied from CRM #[id number]. Topic: Clinical - Red Word Triage >> Jun 19, 2024 12:07 PM Elle L wrote: Red Word that prompted transfer to Nurse Triage: The patient went to Urgent Care two weeks ago. However, she is still experiencing ear and throat pain. Reason for Disposition  [1] Taking antibiotic > 72 hours (3 days) for strep throat AND [2] sore throat not improved  Answer Assessment - Initial Assessment Questions 1. SYMPTOM: What's the main symptom you're concerned about? (e.g., fever, difficulty swallowing, sore throat)     Sore Throat and Ear Discomfort  2. ANTIBIOTIC: What antibiotic are you taking? How many times a day?     Augmentin  BID  3. ONSET: When was the antibiotic started?     2 Weeks Ago  4. THROAT PAIN:  How bad is the sore throat? (Scale 1-10; mild, moderate or severe)     Moderate, Right Sided  5. FEVER: Do you have a fever? If Yes, ask: What is your temperature, how was it measured, and when did it start?     No  6. OTHER SYMPTOMS: Do you have any other symptoms? (e.g., rash)     Ear Discomfort. Drainage  7. BETTER-SAME-WORSE: Are you getting better, staying the same, or getting worse compared to the day you started the antibiotics?     Same; to Worse  8. PREGNANCY: Is there any chance you are pregnant? When was your last menstrual period?     No  and No  Protocols used: Strep Throat Infection on Antibiotic Follow-up Call-A-[facility]

## 2024-06-19 NOTE — Progress Notes (Signed)
 Subjective:  Patient ID: Carla Morales, female    DOB: 1961-08-05, 63 y.o.   MRN: [id number]  Patient Care Team: Lavell Bari LABOR, FNP as PCP - General (Family Medicine) Ginette Shasta NOVAK, NP as Nurse Practitioner (Obstetrics and Gynecology)   Chief Complaint:  Sinusitis   HPI: Carla Morales is a 63 y.o. female presenting on 06/19/2024 for Sinusitis   Carla Morales is a 63 year old female with chronic sinus issues who presents with persistent ear and throat pain.  She has been experiencing ear and throat pain for a couple of weeks, which led her to visit urgent care where she was diagnosed with sinusitis. She was prescribed Augmentin , prednisone , Tessalon  Perles, and eye drops. Despite completing the full course of these medications, she continues to experience ear and throat pain.  She has a history of sinus and allergy  issues but denies exposure to secondhand smoke. No fevers have been noted, and she has checked her temperature multiple times. She uses Flonase  and two other prescription medications along with nasal rinses to manage her symptoms. Her right ear is particularly bothersome.  She has asthma and has used her rescue inhaler due to coughing but reports no new shortness of breath.  She mentions a history of yeast infections when taking antibiotics and notes that she was unable to take Diflucan  previously due to a potential interaction with Lexapro .          Relevant past medical, surgical, family, and social history reviewed and updated as indicated.  Allergies and medications reviewed and updated. Data reviewed: Chart in Epic.   Past Medical History:  Diagnosis Date   Adenomatous colon polyp    Allergy     Arthritis    Asthma    Chondrocalcinosis    COVID-19    Diverticulosis    Elevated alkaline phosphatase level    Elevated antinuclear antibody (ANA) level    Eosinophilic esophagitis    Esophageal dysmotility    Esophageal stenosis     Fatty liver    GERD (gastroesophageal reflux disease)    Hyperlipidemia    Internal hemorrhoids    Migraine    Osteoarthritis    Pinched nerve in neck    Schatzki's ring    Sinus congestion    Urinary incontinence     Past Surgical History:  Procedure Laterality Date   ABDOMINAL HYSTERECTOMY     bladder dialation  1968   as a child   COLONOSCOPY     TONSILLECTOMY  1971   TOTAL VAGINAL HYSTERECTOMY  07/2010   menorrhagia, leiomyoma, prolapse   TUBAL LIGATION  1998    Social History   Socioeconomic History   Marital status: Married    Spouse name: Not on file   Number of children: 1   Years of education: Not on file   Highest education level: Not on file  Occupational History   Not on file  Tobacco Use   Smoking status: Never    Passive exposure: Never   Smokeless tobacco: Never  Vaping Use   Vaping status: Never Used  Substance and Sexual Activity   Alcohol use: No    Alcohol/week: 0.0 standard drinks of alcohol   Drug use: No   Sexual activity: Yes    Partners: Male    Birth control/protection: Surgical    Comment: hyst.-1st intercourse 30 yo-1 partner, menarche 16yo  Other Topics Concern   Not on file  Social History Narrative   Not on  file   Social Drivers of Health   Financial Resource Strain: Not on file  Food Insecurity: Not on file  Transportation Needs: Not on file  Physical Activity: Not on file  Stress: Not on file  Social Connections: Not on file  Intimate Partner Violence: Not on file    Outpatient Encounter Medications as of 06/19/2024  Medication Sig   albuterol  (VENTOLIN  HFA) 108 (90 Base) MCG/ACT inhaler USE 2 INHALATIONS EVERY 6 HOURS AS NEEDED FOR WHEEZING OR SHORTNESS OF BREATH Need office visit for further refills   atorvastatin  (LIPITOR) 10 MG tablet TAKE 1 TABLET DAILY   azelastine  (ASTELIN ) 0.1 % nasal spray Place 2 sprays into both nostrils 2 (two) times daily as needed for rhinitis. Use in each nostril as directed    baclofen  (LIORESAL ) 10 MG tablet Take 1 tablet (10 mg total) by mouth 3 (three) times daily.   Bepotastine  Besilate 1.5 % SOLN Place 1 drop into both eyes 2 (two) times daily as needed.   Blood Glucose Monitoring Suppl (ONETOUCH VERIO FLEX SYSTEM) w/Device KIT Use as directed to check glucose.   Blood Glucose Monitoring Suppl DEVI 1 each by Does not apply route in the morning, at noon, and at bedtime. May substitute to any manufacturer covered by patient's insurance.   cefdinir  (OMNICEF ) 300 MG capsule Take 1 capsule (300 mg total) by mouth 2 (two) times daily. 1 po BID   dexlansoprazole  (DEXILANT ) 60 MG capsule Take 1 capsule (60 mg total) by mouth daily.   Dupilumab  (DUPIXENT ) 300 MG/2ML SOAJ INJECT 1 PEN (300 MG TOTAL) UNDER THE SKIN ONCE A WEEK   escitalopram  (LEXAPRO ) 20 MG tablet Take 1 tablet (20 mg total) by mouth daily.   fluconazole  (DIFLUCAN ) 150 MG tablet Take 1 tablet (150 mg total) by mouth once for 1 dose.   fluticasone  (FLONASE ) 50 MCG/ACT nasal spray Place 2 sprays into both nostrils daily.   glucose blood test strip Please fill one touch verio test strips. Use as instructed to test blood sugar daily. DX: E11.65   hydrOXYzine  (ATARAX ) 25 MG tablet Take 1-2 tablets nightly as needed for itching.   ipratropium (ATROVENT ) 0.03 % nasal spray 1-2 sprays up to two times daily for drainage.   levocetirizine (XYZAL) 5 MG tablet Take 5 mg by mouth in the morning.   magnesium gluconate (MAGONATE) 500 MG tablet Take 500 mg by mouth daily.   meloxicam  (MOBIC ) 15 MG tablet Take 1 tablet (15 mg total) by mouth daily.   mometasone -formoterol  (DULERA ) 200-5 MCG/ACT AERO Inhale 2 puffs into the lungs 2 (two) times daily.   nitroGLYCERIN  (NITROSTAT ) 0.4 MG SL tablet Place 0.4 mg under the tongue every 5 (five) minutes as needed.   Olopatadine HCl (PATADAY OP) Apply to eye.   OneTouch Delica Lancets 33G MISC Use as directed to check glucose daily   Semaglutide , 1 MG/DOSE, 4 MG/3ML SOPN Inject 1 mg  as directed once a week.   VITAMIN E PO Take 800 Int'l Units/day by mouth daily.   No facility-administered encounter medications on file as of 06/19/2024.    Allergies  Allergen Reactions   Other Swelling    Contrast dye   Contrast Media [Iodinated Contrast Media]    Dust Mite Extract    Haldol [Haloperidol Decanoate]     anxiety   Molds & Smuts    Pravastatin  Other (See Comments)    myalgias   Sulfa Antibiotics Rash    Pertinent ROS per HPI, otherwise unremarkable  Objective:  BP 120/71   Pulse 76   Temp (!) 97.5 F (36.4 C) (Temporal)   Ht 5' 5 (1.651 m)   Wt 175 lb (79.4 kg)   LMP 07/24/2010   SpO2 100%   BMI 29.12 kg/m    Wt Readings from Last 3 Encounters:  06/19/24 175 lb (79.4 kg)  05/26/24 179 lb 6.4 oz (81.4 kg)  03/06/24 195 lb 6.4 oz (88.6 kg)    Physical Exam Vitals and nursing note reviewed.  Constitutional:      General: She is not in acute distress.    Appearance: Normal appearance. She is obese. She is not ill-appearing, toxic-appearing or diaphoretic.  HENT:     Head: Normocephalic and atraumatic.     Right Ear: Hearing, ear canal and external ear normal. Tympanic membrane is erythematous and bulging.     Left Ear: A middle ear effusion is present. Tympanic membrane is not erythematous.     Nose: Nose normal.     Mouth/Throat:     Lips: Pink.     Mouth: Mucous membranes are moist.     Pharynx: Posterior oropharyngeal erythema present. No pharyngeal swelling, oropharyngeal exudate, uvula swelling or postnasal drip.  Eyes:     Conjunctiva/sclera: Conjunctivae normal.     Pupils: Pupils are equal, round, and reactive to light.  Cardiovascular:     Rate and Rhythm: Normal rate and regular rhythm.     Heart sounds: Normal heart sounds.  Pulmonary:     Effort: Pulmonary effort is normal.     Breath sounds: Normal breath sounds.  Lymphadenopathy:     Cervical: Cervical adenopathy present.  Skin:    General: Skin is warm and dry.      Capillary Refill: Capillary refill takes less than 2 seconds.  Neurological:     General: No focal deficit present.     Mental Status: She is alert and oriented to person, place, and time.  Psychiatric:        Mood and Affect: Mood normal.        Behavior: Behavior normal.        Thought Content: Thought content normal.        Judgment: Judgment normal.    Results for orders placed or performed in visit on 05/26/24  Bayer DCA Hb A1c Waived   Collection Time: 05/26/24  2:18 PM  Result Value Ref Range   HB A1C (BAYER DCA - WAIVED) 5.6 4.8 - 5.6 %  CMP14+EGFR   Collection Time: 05/26/24  2:20 PM  Result Value Ref Range   Glucose 86 70 - 99 mg/dL   BUN 11 8 - 27 mg/dL   Creatinine, Ser 9.23 0.57 - 1.00 mg/dL   eGFR 90 >40 fO/fpw/8.26   BUN/Creatinine Ratio 14 12 - 28   Sodium 139 134 - 144 mmol/L   Potassium 3.9 3.5 - 5.2 mmol/L   Chloride 103 96 - 106 mmol/L   CO2 20 20 - 29 mmol/L   Calcium  10.0 8.7 - 10.3 mg/dL   Total Protein 6.8 6.0 - 8.5 g/dL   Albumin 4.2 3.8 - 4.9 g/dL   Globulin, Total 2.6 1.5 - 4.5 g/dL   Bilirubin Total 0.3 0.0 - 1.2 mg/dL   Alkaline Phosphatase 152 (H) 44 - 121 IU/L   AST 17 0 - 40 IU/L   ALT 21 0 - 32 IU/L       Pertinent labs & imaging results that were available during my care of the patient  were reviewed by me and considered in my medical decision making.  Assessment & Plan:  Kymorah was seen today for sinusitis.  Diagnoses and all orders for this visit:  Recurrent otitis media, right -     cefdinir  (OMNICEF ) 300 MG capsule; Take 1 capsule (300 mg total) by mouth 2 (two) times daily. 1 po BID  History of vaginitis -     fluconazole  (DIFLUCAN ) 150 MG tablet; Take 1 tablet (150 mg total) by mouth once for 1 dose.     Right otitis media Persistent right ear infection despite a 10-day course of Augmentin . The ear remains infected with enlarged right lymph nodes. No fever reported. - Prescribe cefdinir  300 mg twice daily for 10 days -  Advise to monitor symptoms and if not improved in 5-6 days, contact ENT for further evaluation  Acute sinusitis Experiencing ear and throat pain, likely related to sinusitis. Previous treatment with Augmentin , prednisone , Tessalon  Perles, and eye drops. Continues to use Flonase , two other prescription medications, and nasal rinses.  Allergic rhinitis Chronic sinus and allergy  issues. Currently using Flonase , two other prescription medications, and nasal rinses.  Asthma Recent use of rescue inhaler due to coughing. No new shortness of breath reported.  Vulvovaginal candidiasis Yeast infections following antibiotic use. Previous inability to use Diflucan  due to potential interaction with Lexapro . Current plan to use Diflucan  with reassurance that a one-time dose should not interact significantly with Lexapro . - Prescribe Diflucan  with one refill for potential yeast infection due to antibiotic use          Continue all other maintenance medications.  Follow up plan: Return if symptoms worsen or fail to improve.   Continue healthy lifestyle choices, including diet (rich in fruits, vegetables, and lean proteins, and low in salt and simple carbohydrates) and exercise (at least 30 minutes of moderate physical activity daily).  Educational handout given for AOM  The above assessment and management plan was discussed with the patient. The patient verbalized understanding of and has agreed to the management plan. Patient is aware to call the clinic if they develop any new symptoms or if symptoms persist or worsen. Patient is aware when to return to the clinic for a follow-up visit. Patient educated on when it is appropriate to go to the emergency department.   Rosaline Bruns, FNP-C [facility] [phone]

## 2024-07-10 ENCOUNTER — Telehealth: Payer: Self-pay

## 2024-07-10 ENCOUNTER — Other Ambulatory Visit (HOSPITAL_COMMUNITY): Payer: Self-pay

## 2024-07-10 NOTE — Telephone Encounter (Signed)
 Pharmacy Patient Advocate Encounter   Received notification from Patient Pharmacy that prior authorization for Dupixent  300MG /2ML auto-injectors is required/requested.   Insurance verification completed.   The patient is insured through [facility] .   Per test claim: xxx

## 2024-07-10 NOTE — Telephone Encounter (Signed)
 Patient already has a scheduled appt with Ellouise Console, PA on 07/15/24.

## 2024-07-14 NOTE — Progress Notes (Unsigned)
 Carla Console, PA-C [address] [place], [place]  [address] Phone: [phone]   Primary Care Physician: Lavell Bari LABOR, FNP  Primary Gastroenterologist:  Carla Console, PA-C / Dr. Gordy Starch   Chief Complaint: Follow-up eosinophilic esophagitis and GERD     HPI:   Carla Morales is a 63 y.o. female, established patient Dr. Starch, returns for annual follow-up of eosinophilic esophagitis and GERD.  Currently on Dupixent  300 Mg pen once per week, And Dexilant  60 Mg daily.  Last seen in our office by Dr. Starch 07/2023.  Current symptoms: She is currently doing well.  She is not having any difficulty swallowing or dysphagia symptoms.  She denies breakthrough heartburn or acid reflux.  She has been out of her Dupixent  for 2 weeks and needs a refill.  Of note, within the past year they found mold at her workplace.  They thought this may be contributing to asthma, allergies, cough, and voice hoarseness.  The symptoms have improved since the mold has been treated.  She still has mild voice hoarseness.  She Received vocal cord injection and is followed by ENT.  She has been on long-term PPI for many years.  05/28/2023 last EGD by Dr. Starch: Mucosal changes consistent with eosinophilic esophagitis in the entire esophagus (on Dexilant  60 Mg daily)..  Stomach and duodenum were normal.  She was started on Dupixent .  10/2022 last colonoscopy: 3 small (4 mm to 6 mm) tubular adenoma polyps removed.  Pandiverticulosis.  Good prep.  Biopsy negative for dysplasia.  3-year repeat (due 10/2025).  PMH: History of adenomatous colon polyps, MASH, eosinophilic esophagitis, GERD, hyperlipidemia.  05/26/2024 labs: Elevated alkaline phosphatase 152.  Otherwise normal CMP.  Chart review shows chronically elevated alkaline phosphatase for many years, since at least 2023.  Normal liver transaminases.  Current Outpatient Medications  Medication Sig Dispense Refill   albuterol  (VENTOLIN  HFA) 108 (90  Base) MCG/ACT inhaler USE 2 INHALATIONS EVERY 6 HOURS AS NEEDED FOR WHEEZING OR SHORTNESS OF BREATH Need office visit for further refills 54 g 0   atorvastatin  (LIPITOR) 10 MG tablet TAKE 1 TABLET DAILY 90 tablet 0   azelastine  (ASTELIN ) 0.1 % nasal spray Place 2 sprays into both nostrils 2 (two) times daily as needed for rhinitis. Use in each nostril as directed 90 mL 1   baclofen  (LIORESAL ) 10 MG tablet Take 1 tablet (10 mg total) by mouth 3 (three) times daily. 180 each 2   Bepotastine  Besilate 1.5 % SOLN Place 1 drop into both eyes 2 (two) times daily as needed. 30 mL 1   escitalopram  (LEXAPRO ) 20 MG tablet Take 1 tablet (20 mg total) by mouth daily. 90 tablet 1   fluticasone  (FLONASE ) 50 MCG/ACT nasal spray Place 2 sprays into both nostrils daily. 48 g 1   hydrOXYzine  (ATARAX ) 25 MG tablet Take 1-2 tablets nightly as needed for itching. 60 tablet 3   ipratropium (ATROVENT ) 0.03 % nasal spray 1-2 sprays up to two times daily for drainage. 90 mL 1   levocetirizine (XYZAL) 5 MG tablet Take 5 mg by mouth in the morning.     magnesium gluconate (MAGONATE) 500 MG tablet Take 500 mg by mouth daily.     meloxicam  (MOBIC ) 15 MG tablet Take 1 tablet (15 mg total) by mouth daily. 90 tablet 0   mometasone -formoterol  (DULERA ) 200-5 MCG/ACT AERO Inhale 2 puffs into the lungs 2 (two) times daily. 3 each 1   nitroGLYCERIN  (NITROSTAT ) 0.4 MG SL  tablet Place 0.4 mg under the tongue every 5 (five) minutes as needed.     Semaglutide , 1 MG/DOSE, 4 MG/3ML SOPN Inject 1 mg as directed once a week. 3 mL 2   VITAMIN E PO Take 800 Int'l Units/day by mouth daily.     dexlansoprazole  (DEXILANT ) 60 MG capsule Take 1 capsule (60 mg total) by mouth daily. 90 capsule 3   Dupilumab  (DUPIXENT ) 300 MG/2ML SOAJ INJECT 1 PEN (300 MG TOTAL) UNDER THE SKIN ONCE A WEEK 8 mL 11   No current facility-administered medications for this visit.    Allergies as of 07/15/2024 - Review Complete 07/15/2024  Allergen Reaction Noted    Other Swelling 10/16/2013   Contrast media [iodinated contrast media]  09/18/2019   Dust mite extract  07/18/2023   Haldol [haloperidol decanoate]  05/19/2011   Molds & smuts  07/18/2023   Pravastatin  Other (See Comments) 08/15/2013   Sulfa antibiotics Rash 05/19/2011    Past Medical History:  Diagnosis Date   Adenomatous colon polyp    Allergy     Arthritis    Asthma    Chondrocalcinosis    COVID-19    Diabetes mellitus type II, controlled (HCC)    Diverticulosis    Elevated alkaline phosphatase level    Elevated antinuclear antibody (ANA) level    Eosinophilic esophagitis    Esophageal dysmotility    Esophageal stenosis    Fatty liver    GERD (gastroesophageal reflux disease)    Hyperlipidemia    Internal hemorrhoids    Migraine    Osteoarthritis    Pinched nerve in neck    Schatzki's ring    Sinus congestion    Urinary incontinence     Past Surgical History:  Procedure Laterality Date   ABDOMINAL HYSTERECTOMY     bladder dialation  1968   as a child   COLONOSCOPY     TONSILLECTOMY  1971   TOTAL VAGINAL HYSTERECTOMY  07/2010   menorrhagia, leiomyoma, prolapse   TUBAL LIGATION  1998    Review of Systems:    All systems reviewed and negative except where noted in HPI.    Physical Exam:  BP 90/64   Pulse 78   Ht 5' 5 (1.651 m)   Wt 170 lb 9.6 oz (77.4 kg)   LMP 07/24/2010   BMI 28.39 kg/m  Patient's last menstrual period was 07/24/2010.  General: Well-nourished, well-developed in no acute distress.  Neuro: Alert and oriented x 3.  Grossly intact.  Psych: Alert and cooperative, normal mood and affect.   Imaging Studies: No results found.  Labs: CBC    Component Value Date/Time   WBC 7.9 01/24/2024 0905   WBC 7.6 03/24/2015 0857   WBC 15.0 (H) 08/10/2010 0520   RBC 4.40 01/24/2024 0905   RBC 4.60 03/24/2015 0857   RBC 3.80 (L) 08/10/2010 0520   HGB 12.9 01/24/2024 0905   HCT 39.3 01/24/2024 0905   PLT 293 01/24/2024 0905   MCV 89  01/24/2024 0905   MCH 29.3 01/24/2024 0905   MCH 28.3 03/24/2015 0857   MCH 31.1 08/10/2010 0520   MCHC 32.8 01/24/2024 0905   MCHC 31.3 (A) 03/24/2015 0857   MCHC 34.8 08/10/2010 0520   RDW 14.5 01/24/2024 0905   LYMPHSABS 2.1 01/24/2024 0905   MONOABS 1.0 08/10/2010 0520   EOSABS 0.3 01/24/2024 0905   BASOSABS 0.1 01/24/2024 0905    CMP     Component Value Date/Time   NA 139 05/26/2024 1420  K 3.9 05/26/2024 1420   CL 103 05/26/2024 1420   CO2 20 05/26/2024 1420   GLUCOSE 86 05/26/2024 1420   GLUCOSE 113 (H) 03/29/2023 0959   BUN 11 05/26/2024 1420   CREATININE 0.76 05/26/2024 1420   CALCIUM  10.0 05/26/2024 1420   PROT 6.8 05/26/2024 1420   ALBUMIN 4.2 05/26/2024 1420   AST 17 05/26/2024 1420   ALT 21 05/26/2024 1420   ALKPHOS 152 (H) 05/26/2024 1420   BILITOT 0.3 05/26/2024 1420   GFRNONAA 87 01/07/2021 1117   GFRAA 101 01/07/2021 1117      Assessment and Plan:   Carla Morales is a 63 y.o. y/o female presents for annual follow-up of  1.  Eosinophilic esophagitis: Under good control on Dupixent .  Asymptomatic. - Continue Dupixent  300 Mg pen once per week.  Rx sent to [facility] pharmacy with refills for 1 year. - Consider repeat surveillance EGD next year at same time of colonoscopy.  2.  GERD: Controlled on Dexilant . - Continue Dexilant  60 Mg once daily, #90, 3 refills. Rx sent to [facility] - Recommend Lifestyle Modifications to prevent Acid Reflux.  Rec. Avoid coffee, sodas, peppermint, garlic, onions, alcohol, citrus fruits, chocolate, tomatoes, fatty and spicey foods.  Avoid eating 2-3 hours before bedtime.    3.  Elevated alkaline phosphatase - Labs: Alkaline phosphatase isoenzymes, GGT, intact PTH and calcium , hepatic panel, ANA, AMA, ASMA.  4.  Long-term use of medication PPI for many years - Lab: Vitamin D , magnesium, B12, iron - Recent BMP labs were normal. - We discussed adverse side effects of PPIs to include vitamin deficiencies,  osteoporosis, renal insufficiency.  Recommend take lowest effective dose of PPI necessary to control acid reflux.  OK to add H2RB (Pepcid  20mg  daily) or antiacid if needed for breakthrough acid reflux.   5.  History of adenomatous colon polyps - 3-year repeat colonoscopy will be due 10/2025.   Carla Console, PA-C  Follow up in 1 year or sooner if she develops GI symptoms.

## 2024-07-15 ENCOUNTER — Ambulatory Visit: Admitting: Physician Assistant

## 2024-07-15 ENCOUNTER — Other Ambulatory Visit (INDEPENDENT_AMBULATORY_CARE_PROVIDER_SITE_OTHER)

## 2024-07-15 ENCOUNTER — Encounter: Payer: Self-pay | Admitting: Physician Assistant

## 2024-07-15 VITALS — BP 90/64 | HR 78 | Ht 65.0 in | Wt 170.6 lb

## 2024-07-15 DIAGNOSIS — R748 Abnormal levels of other serum enzymes: Secondary | ICD-10-CM | POA: Diagnosis not present

## 2024-07-15 DIAGNOSIS — Z79899 Other long term (current) drug therapy: Secondary | ICD-10-CM | POA: Diagnosis not present

## 2024-07-15 DIAGNOSIS — Z860101 Personal history of adenomatous and serrated colon polyps: Secondary | ICD-10-CM

## 2024-07-15 DIAGNOSIS — K200 Eosinophilic esophagitis: Secondary | ICD-10-CM

## 2024-07-15 DIAGNOSIS — K2100 Gastro-esophageal reflux disease with esophagitis, without bleeding: Secondary | ICD-10-CM

## 2024-07-15 LAB — GAMMA GT: GGT: 43 U/L (ref 7–51)

## 2024-07-15 LAB — HEPATIC FUNCTION PANEL
ALT: 19 U/L (ref 0–35)
AST: 16 U/L (ref 0–37)
Albumin: 4.3 g/dL (ref 3.5–5.2)
Alkaline Phosphatase: 124 U/L — ABNORMAL HIGH (ref 39–117)
Bilirubin, Direct: 0 mg/dL (ref 0.0–0.3)
Total Bilirubin: 0.4 mg/dL (ref 0.2–1.2)
Total Protein: 7.5 g/dL (ref 6.0–8.3)

## 2024-07-15 LAB — VITAMIN D 25 HYDROXY (VIT D DEFICIENCY, FRACTURES): VITD: 40.9 ng/mL (ref 30.00–100.00)

## 2024-07-15 LAB — VITAMIN B12: Vitamin B-12: 245 pg/mL (ref 211–911)

## 2024-07-15 LAB — IRON: Iron: 41 ug/dL — ABNORMAL LOW (ref 42–145)

## 2024-07-15 MED ORDER — DUPIXENT 300 MG/2ML ~~LOC~~ SOAJ: SUBCUTANEOUS | 11 refills | Status: DC

## 2024-07-15 MED ORDER — DEXLANSOPRAZOLE 60 MG PO CPDR
1.0000 | DELAYED_RELEASE_CAPSULE | Freq: Every day | ORAL | 3 refills | Status: AC
Start: 2024-07-15 — End: ?

## 2024-07-15 NOTE — Patient Instructions (Addendum)
 Your provider has requested that you go to the basement level for lab work before leaving today. Press B on the elevator. The lab is located at the first door on the left as you exit the elevator.  We have sent the following medications to your pharmacy for you to pick up at your convenience: Dupixent  300 mg inject once weekly and Dexilant  60 mg once daily  Please follow up sooner if symptoms increase or worsen  Due to recent changes in healthcare laws, you may see the results of your imaging and laboratory studies on MyChart before your provider has had a chance to review them.  We understand that in some cases there may be results that are confusing or concerning to you. Not all laboratory results come back in the same time frame and the provider may be waiting for multiple results in order to interpret others.  Please give us  48 hours in order for your provider to thoroughly review all the results before contacting the office for clarification of your results.   Thank you for trusting me with your gastrointestinal care!   Ellouise Console, PA-C _______________________________________________________  If your blood pressure at your visit was 140/90 or greater, please contact your primary care physician to follow up on this.  _______________________________________________________  If you are age 63 or older, your body mass index should be between 23-30. Your Body mass index is 28.39 kg/m. If this is out of the aforementioned range listed, please consider follow up with your Primary Care Provider.  If you are age 76 or younger, your body mass index should be between 19-25. Your Body mass index is 28.39 kg/m. If this is out of the aformentioned range listed, please consider follow up with your Primary Care Provider.   ________________________________________________________  The [facility] providers would like to encourage you to use MYCHART to communicate with providers for non-urgent  requests or questions.  Due to long hold times on the telephone, sending your provider a message by [facility] may be a faster and more efficient way to get a response.  Please allow 48 business hours for a response.  Please remember that this is for non-urgent requests.  _______________________________________________________

## 2024-07-16 ENCOUNTER — Telehealth: Payer: Self-pay

## 2024-07-16 NOTE — Telephone Encounter (Signed)
 Pharmacy Patient Advocate Encounter   Received notification from Pt Calls Messages that prior authorization for Dupixent  300MG /2ML auto-injectors is required/requested.   Insurance verification completed.   The patient is insured through [facility] .   Per test claim: PA required; PA submitted to above mentioned insurance via Latent Key/confirmation #/EOC [id number] Status is pending

## 2024-07-17 LAB — ANA: Anti Nuclear Antibody (ANA): POSITIVE — AB

## 2024-07-17 LAB — ALKALINE PHOSPHATASE ISOENZYMES
Alkaline phosphatase (APISO): 132 U/L (ref 37–153)
Bone Isoenzymes: 24 % — ABNORMAL LOW (ref 28–66)
Intestinal Isoenzymes: 2 % (ref 1–24)
Liver Isoenzymes: 73 % — ABNORMAL HIGH (ref 25–69)
Macrohepatic isoenzymes: 0 % (ref <=0)
Placental isoenzymes: 0 % (ref <=0)

## 2024-07-17 LAB — ANTI-NUCLEAR AB-TITER (ANA TITER)
ANA TITER: 1:80 {titer} — ABNORMAL HIGH
ANA Titer 1: 1:80 {titer} — ABNORMAL HIGH

## 2024-07-17 LAB — EXTRA SPECIMEN

## 2024-07-17 LAB — MITOCHONDRIAL ANTIBODIES: Mitochondrial M2 Ab, IgG: <=20 U (ref <=20.0)

## 2024-07-17 LAB — PTH, INTACT AND CALCIUM
Calcium: 10 mg/dL (ref 8.6–10.4)
PTH: 51 pg/mL (ref 16–77)

## 2024-07-17 LAB — ANTI-SMOOTH MUSCLE ANTIBODY, IGG: Actin (Smooth Muscle) Antibody (IGG): <20 U (ref <20)

## 2024-07-17 NOTE — Telephone Encounter (Signed)
 Pharmacy Patient Advocate Encounter  Received notification from [facility] that Prior Authorization for Dupixent  300MG /2ML auto-injectors has been APPROVED from 07-16-2024 to 07-16-2025   PA #/Case ID/Reference #:  [id number]

## 2024-07-22 ENCOUNTER — Ambulatory Visit: Payer: Self-pay | Admitting: Physician Assistant

## 2024-07-22 ENCOUNTER — Other Ambulatory Visit: Payer: Self-pay

## 2024-07-22 DIAGNOSIS — R7989 Other specified abnormal findings of blood chemistry: Secondary | ICD-10-CM

## 2024-07-22 DIAGNOSIS — K760 Fatty (change of) liver, not elsewhere classified: Secondary | ICD-10-CM

## 2024-07-22 DIAGNOSIS — R768 Other specified abnormal immunological findings in serum: Secondary | ICD-10-CM

## 2024-07-22 DIAGNOSIS — K7581 Nonalcoholic steatohepatitis (NASH): Secondary | ICD-10-CM

## 2024-07-24 ENCOUNTER — Telehealth: Payer: Self-pay

## 2024-07-24 NOTE — Telephone Encounter (Signed)
 Referral faxed to [facility]. Faxed to [phone].Referral faxed through epic.

## 2024-07-24 NOTE — Telephone Encounter (Signed)
 Pt requesting rheumatology referral be sent to [facility] rheum.

## 2024-07-26 ENCOUNTER — Ambulatory Visit (HOSPITAL_COMMUNITY)
Admission: RE | Admit: 2024-07-26 | Discharge: 2024-07-26 | Disposition: A | Discharge status: Home / Self Care | Source: Ambulatory Visit | Attending: Physician Assistant | Admitting: Physician Assistant

## 2024-07-26 DIAGNOSIS — R932 Abnormal findings on diagnostic imaging of liver and biliary tract: Secondary | ICD-10-CM | POA: Diagnosis not present

## 2024-07-26 DIAGNOSIS — R768 Other specified abnormal immunological findings in serum: Secondary | ICD-10-CM | POA: Insufficient documentation

## 2024-07-26 DIAGNOSIS — K7581 Nonalcoholic steatohepatitis (NASH): Secondary | ICD-10-CM | POA: Diagnosis not present

## 2024-07-26 DIAGNOSIS — R7989 Other specified abnormal findings of blood chemistry: Secondary | ICD-10-CM | POA: Insufficient documentation

## 2024-07-26 DIAGNOSIS — K760 Fatty (change of) liver, not elsewhere classified: Secondary | ICD-10-CM | POA: Diagnosis not present

## 2024-07-26 DIAGNOSIS — K709 Alcoholic liver disease, unspecified: Secondary | ICD-10-CM | POA: Diagnosis not present

## 2024-07-26 DIAGNOSIS — K759 Inflammatory liver disease, unspecified: Secondary | ICD-10-CM | POA: Diagnosis not present

## 2024-08-01 ENCOUNTER — Ambulatory Visit: Payer: Self-pay | Admitting: Physician Assistant

## 2024-08-11 ENCOUNTER — Ambulatory Visit (INDEPENDENT_AMBULATORY_CARE_PROVIDER_SITE_OTHER): Admitting: Internal Medicine

## 2024-08-11 ENCOUNTER — Encounter: Payer: Self-pay | Admitting: Internal Medicine

## 2024-08-11 ENCOUNTER — Other Ambulatory Visit: Payer: Self-pay

## 2024-08-11 VITALS — BP 98/62 | HR 72 | Temp 97.8°F | Ht 65.0 in | Wt 166.9 lb

## 2024-08-11 DIAGNOSIS — K219 Gastro-esophageal reflux disease without esophagitis: Secondary | ICD-10-CM

## 2024-08-11 DIAGNOSIS — J3089 Other allergic rhinitis: Secondary | ICD-10-CM | POA: Diagnosis not present

## 2024-08-11 DIAGNOSIS — J301 Allergic rhinitis due to pollen: Secondary | ICD-10-CM

## 2024-08-11 DIAGNOSIS — H1013 Acute atopic conjunctivitis, bilateral: Secondary | ICD-10-CM

## 2024-08-11 DIAGNOSIS — K200 Eosinophilic esophagitis: Secondary | ICD-10-CM

## 2024-08-11 DIAGNOSIS — J4530 Mild persistent asthma, uncomplicated: Secondary | ICD-10-CM

## 2024-08-11 MED ORDER — DULERA 100-5 MCG/ACT IN AERO: 2.0000 | INHALATION_SPRAY | Freq: Every day | RESPIRATORY_TRACT | 5 refills | Status: AC

## 2024-08-11 MED ORDER — ALBUTEROL SULFATE HFA 108 (90 BASE) MCG/ACT IN AERS: 2.0000 | INHALATION_SPRAY | RESPIRATORY_TRACT | 1 refills | Status: AC | PRN

## 2024-08-11 MED ORDER — AZELASTINE HCL 0.1 % NA SOLN: 2.0000 | Freq: Two times a day (BID) | NASAL | 1 refills | Status: AC | PRN

## 2024-08-11 MED ORDER — FLUTICASONE PROPIONATE 50 MCG/ACT NA SUSP: 2.0000 | Freq: Every day | NASAL | 1 refills | Status: AC

## 2024-08-11 NOTE — Progress Notes (Signed)
 FOLLOW UP Date of Service/Encounter:   08/11/2024  Subjective:  Carla Morales (DOB: [birth year]) is a 63 y.o. female who [facility] in re-evaluation of the following:  asthma, allergic rhinitis, pruritus , eosinophilic esophagitis  History obtained from: chart review and patient.  For Review, LV was on 02/11/24  with Dr.Shawneequa Morales seen for routine follow-up. See below for summary of history and diagnostics.   Therapeutic plans/changes recommended: FEV1 91%, doing well on dupixent .  ----------------------------------------------------- Pertinent History/Diagnostics:  Asthma, controlled on dupixent : Diagnosed as an adult. Sx: SOB, moderate exercise intolerance. 2-3 courses systemic steroids in 2023-2024. No prior hospitalizations. Triggers: exercise and respiratory illness, symptoms worsened after COVID infections in 2020 and 2021. No smoke exposure. QVAR  gave her oral thrush.  -  AEC (07/13/22): 200 -  CXR on (06/282024): normal - normal spirometry (03/28/23): ratio 0.86, 92% FEV1 Allergic Rhinitis:  hoarse voice, feeling something is stuck in her throat, eyes watery, feel blurry like a film over them, runny nose, generalized pruritus Occurs year-round with seasonal flares-spring and fall Has reflux. Follows with GI on dexilant  recently switched to vonoprazan (03/29/23), scope concerning for EoE without histologic diagnosis. Chokes on dry meats. Has had 2 esophageal dilations. S/p tonsillectomy. - SPT environmental panel (03/28/23): skin testing borderline to dust mites, intradermals positive to minor molds  - SPT most common food allergens + potatoes, chicken, pork, beef and pineapple (03/28/23) negative Generalized pruritus:  NASH present, no rash unless scratches.  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 --------------------------------------------------- Today presents for follow-up. Discussed the use of  AI scribe software for clinical note transcription with the patient, who gave verbal consent to proceed.  History of Present Illness Carla Morales is a 63 year old female with asthma and eosinophilic esophagitis who presents for follow-up of her respiratory and allergy  management.  Asthma exacerbation and control - Asthma flare-up occurred last week, attributed to smoke exposure during a weekend trip - Managed symptoms with Dulera  200 mcg, two puffs once daily, occasionally adding an extra puff at night during flares - Used albuterol  for coughing during the flare-up - No requirement for prednisone  during the recent exacerbation - Asthma remained stable during a prior ear infection, did get prescribed prednisone  for ear infection.  Allergic rhinitis - Allergy  symptoms are well-controlled with Xyzal in the morning and nasal sprays - No significant allergy  symptoms recently  Eosinophilic esophagitis - Off Dupixent  for six to eight weeks due to insurance issues - Plans to resume Dupixent  this week - EoE symptoms stable  Otitis media - Ear infection in late spring lasted approximately two months - Required two rounds of antibiotics for resolution - No additional antibiotics required since resolution of infection  Dysphonia - Received vocal cord filler injections with beneficial effect - Anticipates need for another injection in approximately nine months -voice quality reported as improved  Chart Review: ENT visit 04/11/24- 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 No new pertinent medical or surgical history except as noted in HPI.  ROS: All others negative except as noted per HPI.   Objective:  BP 98/62 (BP Location: Left Arm, Patient Position: Sitting, Cuff Size: Normal)   Pulse 72   Temp 97.8 F (36.6 C) (Temporal)   Ht 5' 5 (1.651 m)   Wt 166 lb 14.4 oz (75.7  kg)   LMP 07/24/2010  SpO2 96%   BMI 27.77 kg/m  Body mass index is 27.77 kg/m. Physical Exam: General Appearance:  Alert, cooperative, no distress, appears stated age  Head:  Normocephalic, without obvious abnormality, atraumatic  Eyes:  Conjunctiva clear, EOM's intact  Ears EACs normal bilaterally and normal TMs bilaterally  Nose: Nares normal, hypertrophic turbinates, normal mucosa, and no visible anterior polyps  Throat: Lips, tongue normal; teeth and gums normal, normal posterior oropharynx  Neck: Supple, symmetrical  Lungs:   clear to auscultation bilaterally, Respirations unlabored, no coughing  Heart:  regular rate and rhythm and no murmur, Appears well perfused  Extremities: No edema  Skin: Skin color, texture, turgor normal and no rashes or lesions on visualized portions of skin  Neurologic: No gross deficits   Labs:  Lab Orders  No laboratory test(s) ordered today    Spirometry:  Tracings reviewed. Her effort: Good reproducible efforts. FVC: 2.83L FEV1: 2.15L, 81% predicted FEV1/FVC ratio: 0.76 Interpretation: Spirometry consistent with normal pattern.  Please see scanned spirometry results for details.  Assessment/Plan   Asthma - well-controlled, stepping down therapy Dulera  100 mcg 2 puffs once daily. (Replaced Dulera  200) During respiratory illness/asthma flare: increase dulera  100 mcg 2 puffs twice daily for 1-2 weeks or until symptoms improve Continue albuterol  2 puffs once every 4 hours as needed for cough or wheeze You may use albuterol  2 puffs 5 to 15 minutes before activity to decrease cough or wheeze  Continue to rinse and brush teeth after using inhaler. Use with spacer.   Allergic rhinitis-well-controlled Continue allergen avoidance measures directed toward dust mite and mold as listed below Consider allergy  injections Continue Xyzal 5 mg once a day as needed for runny nose or itch Continue Flonase  2 sprays in each nostril once a day as needed  for stuffy nose Continue azelastine  2 sprays in each nostril up to twice a day as needed for runny nose Consider saline nasal rinses as needed for nasal symptoms. Use this before any medicated nasal sprays for best result Continue to follow with ENT for vocal cords/hoarseness  Allergic conjunctivitis-at goal Some over the counter eye drops include Pataday one drop in each eye once a day as needed for red, itchy eyes OR Zaditor one drop in each eye twice a day as needed for red itchy eyes. Avoid eye drops that say red eye relief as they may contain medications that dry out your eyes.  Will prescribe Bepreve eyedrops which is covered you can use 1 drop twice daily as needed in place of the options listed above  Pruritus-at goal Continue Xyzal 5 mg once a day as needed for itch Continue hydroxyzine  as needed for nighttime itch  Reflux and dysphagia/EoE-at goal, improved on Dupixent  - continue medications as prescribed by GI - continue Dupixent  weekly (prescribed by GI)  Call the clinic if this treatment plan is not working well for you  Follow up in the clinic in 6 months, sooner if needed.   Other: none  Rocky Endow, MD  [facility] 

## 2024-08-11 NOTE — Patient Instructions (Addendum)
 Asthma - well-controlled, stepping down therapy Dulera  100 mcg 2 puffs once daily. (Replaced Dulera  200) During respiratory illness/asthma flare: increase dulera  100 mcg 2 puffs twice daily for 1-2 weeks or until symptoms improve Continue albuterol  2 puffs once every 4 hours as needed for cough or wheeze You may use albuterol  2 puffs 5 to 15 minutes before activity to decrease cough or wheeze  Continue to rinse and brush teeth after using inhaler. Use with spacer.   Allergic rhinitis-well-controlled Continue allergen avoidance measures directed toward dust mite and mold as listed below Consider allergy  injections Continue Xyzal 5 mg once a day as needed for runny nose or itch Continue Flonase  2 sprays in each nostril once a day as needed for stuffy nose Continue azelastine  2 sprays in each nostril up to twice a day as needed for runny nose Consider saline nasal rinses as needed for nasal symptoms. Use this before any medicated nasal sprays for best result Continue to follow with ENT for vocal cords/hoarseness  Allergic conjunctivitis-at goal Some over the counter eye drops include Pataday one drop in each eye once a day as needed for red, itchy eyes OR Zaditor one drop in each eye twice a day as needed for red itchy eyes. Avoid eye drops that say red eye relief as they may contain medications that dry out your eyes.  Will prescribe Bepreve eyedrops which is covered you can use 1 drop twice daily as needed in place of the options listed above  Pruritus-at goal Continue Xyzal 5 mg once a day as needed for itch Continue hydroxyzine  as needed for nighttime itch  Reflux and dysphagia/EoE-at goal, improved on Dupixent  - continue medications as prescribed by GI - continue Dupixent  weekly (prescribed by GI)  Call the clinic if this treatment plan is not working well for you  Follow up in the clinic in 6 months, sooner if needed.    Control of Dust Mite Allergen Dust mites play a major role  in allergic asthma and rhinitis. They occur in environments with high humidity wherever human skin is found. Dust mites absorb humidity from the atmosphere (ie, they do not drink) and feed on organic matter (including shed human and animal skin). Dust mites are a microscopic type of insect that you cannot see with the naked eye. High levels of dust mites have been detected from mattresses, pillows, carpets, upholstered furniture, bed covers, clothes, soft toys and any woven material. The principal allergen of the dust mite is found in its feces. A gram of dust may contain 1,000 mites and 250,000 fecal particles. Mite antigen is easily measured in the air during house cleaning activities. Dust mites do not bite and do not cause harm to humans, other than by triggering allergies/asthma.  Ways to decrease your exposure to dust mites in your home:  1. Encase mattresses, box springs and pillows with a mite-impermeable barrier or cover  2. Wash sheets, blankets and drapes weekly in hot water (130 F) with detergent and dry them in a dryer on the hot setting.  3. Have the room cleaned frequently with a vacuum cleaner and a damp dust-mop. For carpeting or rugs, vacuuming with a vacuum cleaner equipped with a high-efficiency particulate air (HEPA) filter. The dust mite allergic individual should not be in a room which is being cleaned and should wait 1 hour after cleaning before going into the room.  4. Do not sleep on upholstered furniture (eg, couches).  5. If possible removing carpeting, upholstered furniture  and drapery from the home is ideal. Horizontal blinds should be eliminated in the rooms where the person spends the most time (bedroom, study, television room). Washable vinyl, roller-type shades are optimal.  6. Remove all non-washable stuffed toys from the bedroom. Wash stuffed toys weekly like sheets and blankets above.  7. Reduce indoor humidity to less than 50%. Inexpensive humidity monitors can  be purchased at most hardware stores. Do not use a humidifier as can make the problem worse and are not recommended.  Control of Mold Allergen Mold and fungi can grow on a variety of surfaces provided certain temperature and moisture conditions exist.  Outdoor molds grow on plants, decaying vegetation and soil.  The major outdoor mold, Alternaria and Cladosporium, are found in very high numbers during hot and dry conditions.  Generally, a late Summer - Fall peak is seen for common outdoor fungal spores.  Rain will temporarily lower outdoor mold spore count, but counts rise rapidly when the rainy period ends.  The most important indoor molds are Aspergillus and Penicillium.  Dark, humid and poorly ventilated basements are ideal sites for mold growth.  The next most common sites of mold growth are the bathroom and the kitchen.  Outdoor [employer] Use air conditioning and keep windows closed Avoid exposure to decaying vegetation. Avoid leaf raking. Avoid grain handling. Consider wearing a face mask if working in moldy areas.  Indoor Mold Control Maintain humidity below 50%. Clean washable surfaces with 5% bleach solution. Remove sources e.g. Contaminated carpets.

## 2024-08-12 ENCOUNTER — Other Ambulatory Visit: Payer: Self-pay | Admitting: Family

## 2024-08-12 DIAGNOSIS — E1169 Type 2 diabetes mellitus with other specified complication: Secondary | ICD-10-CM

## 2024-08-22 ENCOUNTER — Other Ambulatory Visit

## 2024-08-22 DIAGNOSIS — R7689 Other specified abnormal immunological findings in serum: Secondary | ICD-10-CM | POA: Diagnosis not present

## 2024-08-22 DIAGNOSIS — K7581 Nonalcoholic steatohepatitis (NASH): Secondary | ICD-10-CM

## 2024-08-22 DIAGNOSIS — R7989 Other specified abnormal findings of blood chemistry: Secondary | ICD-10-CM | POA: Diagnosis not present

## 2024-08-22 DIAGNOSIS — K760 Fatty (change of) liver, not elsewhere classified: Secondary | ICD-10-CM

## 2024-08-22 LAB — CBC WITH DIFFERENTIAL/PLATELET
Basophils Absolute: 0.1 K/uL (ref 0.0–0.1)
Basophils Relative: 0.7 % (ref 0.0–3.0)
Eosinophils Absolute: 0.4 K/uL (ref 0.0–0.7)
Eosinophils Relative: 4.5 % (ref 0.0–5.0)
HCT: 38.7 % (ref 36.0–46.0)
Hemoglobin: 13 g/dL (ref 12.0–15.0)
Lymphocytes Relative: 28.8 % (ref 12.0–46.0)
Lymphs Abs: 2.4 K/uL (ref 0.7–4.0)
MCHC: 33.5 g/dL (ref 30.0–36.0)
MCV: 88.3 fl (ref 78.0–100.0)
Monocytes Absolute: 0.5 K/uL (ref 0.1–1.0)
Monocytes Relative: 6.6 % (ref 3.0–12.0)
Neutro Abs: 5 K/uL (ref 1.4–7.7)
Neutrophils Relative %: 59.4 % (ref 43.0–77.0)
Platelets: 267 K/uL (ref 150.0–400.0)
RBC: 4.38 Mil/uL (ref 3.87–5.11)
RDW: 14.1 % (ref 11.5–15.5)
WBC: 8.3 K/uL (ref 4.0–10.5)

## 2024-08-22 LAB — IBC + FERRITIN
Ferritin: 63.1 ng/mL (ref 10.0–291.0)
Iron: 44 ug/dL (ref 42–145)
Saturation Ratios: 16.1 % — ABNORMAL LOW (ref 20.0–50.0)
TIBC: 273 ug/dL (ref 250.0–450.0)
Transferrin: 195 mg/dL — ABNORMAL LOW (ref 212.0–360.0)

## 2024-08-28 ENCOUNTER — Ambulatory Visit: Admitting: Family

## 2024-08-28 ENCOUNTER — Encounter: Payer: Self-pay | Admitting: Family

## 2024-08-28 VITALS — BP 119/78 | HR 89 | Temp 97.1°F | Ht 65.0 in | Wt 166.6 lb

## 2024-08-28 DIAGNOSIS — E559 Vitamin D deficiency, unspecified: Secondary | ICD-10-CM | POA: Diagnosis not present

## 2024-08-28 DIAGNOSIS — F411 Generalized anxiety disorder: Secondary | ICD-10-CM | POA: Diagnosis not present

## 2024-08-28 DIAGNOSIS — J4530 Mild persistent asthma, uncomplicated: Secondary | ICD-10-CM

## 2024-08-28 DIAGNOSIS — E669 Obesity, unspecified: Secondary | ICD-10-CM

## 2024-08-28 DIAGNOSIS — Z7985 Long-term (current) use of injectable non-insulin antidiabetic drugs: Secondary | ICD-10-CM

## 2024-08-28 DIAGNOSIS — Z0001 Encounter for general adult medical examination with abnormal findings: Secondary | ICD-10-CM

## 2024-08-28 DIAGNOSIS — M5412 Radiculopathy, cervical region: Secondary | ICD-10-CM | POA: Diagnosis not present

## 2024-08-28 DIAGNOSIS — E7800 Pure hypercholesterolemia, unspecified: Secondary | ICD-10-CM

## 2024-08-28 DIAGNOSIS — E1169 Type 2 diabetes mellitus with other specified complication: Secondary | ICD-10-CM | POA: Diagnosis not present

## 2024-08-28 DIAGNOSIS — Z0000 Encounter for general adult medical examination without abnormal findings: Secondary | ICD-10-CM

## 2024-08-28 DIAGNOSIS — Z23 Encounter for immunization: Secondary | ICD-10-CM

## 2024-08-28 DIAGNOSIS — K219 Gastro-esophageal reflux disease without esophagitis: Secondary | ICD-10-CM

## 2024-08-28 LAB — BAYER DCA HB A1C WAIVED: HB A1C (BAYER DCA - WAIVED): 5.3 % (ref 4.8–5.6)

## 2024-08-28 LAB — LIPID PANEL

## 2024-08-28 MED ORDER — MELOXICAM 15 MG PO TABS: 15.0000 mg | ORAL_TABLET | Freq: Every day | ORAL | 0 refills | Status: AC

## 2024-08-28 MED ORDER — ATORVASTATIN CALCIUM 10 MG PO TABS: 10.0000 mg | ORAL_TABLET | Freq: Every day | ORAL | 0 refills | Status: DC

## 2024-08-28 MED ORDER — ESCITALOPRAM OXALATE 20 MG PO TABS: 20.0000 mg | ORAL_TABLET | Freq: Every day | ORAL | 1 refills | Status: AC

## 2024-08-28 MED ORDER — OZEMPIC (1 MG/DOSE) 4 MG/3ML ~~LOC~~ SOPN
PEN_INJECTOR | SUBCUTANEOUS | 1 refills | Status: AC
Start: 2024-08-28 — End: ?

## 2024-08-28 NOTE — Patient Instructions (Signed)

## 2024-08-28 NOTE — Progress Notes (Signed)
 Subjective:    Patient ID: Carla Morales, female    DOB: 12/18/1960, 63 y.o.   MRN: [id number]  Chief Complaint  Patient presents with   Medical Management of Chronic Issues   Pt presents to the office today for CPE. She is followed by GYN annually.    She is followed by GI for GERD and EOE.   She is followed by ENT for hoarse voice.   Also, asthma and allergen every 6 months. Her breathing is controlled.   She is currently Ozempic  1 mg. Her starting weight was 195 lb. She has lost 29 lbs.      08/28/2024    8:54 AM 08/11/2024    8:51 AM 07/15/2024    8:21 AM  Last 3 Weights  Weight (lbs) 166 lb 9.6 oz 166 lb 14.4 oz 170 lb 9.6 oz  Weight (kg) 75.569 kg 75.705 kg 77.384 kg     Asthma There is no cough, shortness of breath or wheezing. This is a chronic problem. The current episode started more than 1 year ago. The problem occurs intermittently. Associated symptoms include heartburn. Her symptoms are alleviated by rest and beta-agonist. She reports moderate improvement on treatment. Her past medical history is significant for asthma.  Gastroesophageal Reflux She complains of belching and heartburn. She reports no coughing or no wheezing. This is a chronic problem. The current episode started more than 1 year ago. The problem occurs occasionally. The symptoms are aggravated by certain foods. Risk factors include obesity. She has tried a PPI for the symptoms. The treatment provided moderate relief.  Hyperlipidemia This is a chronic problem. The current episode started more than 1 year ago. The problem is controlled. Recent lipid tests were reviewed and are normal. Exacerbating diseases include obesity. Pertinent negatives include no shortness of breath. Current antihyperlipidemic treatment includes statins. The current treatment provides moderate improvement of lipids. Risk factors for coronary artery disease include dyslipidemia, hypertension, a sedentary lifestyle and  post-menopausal.  Anxiety Presents for follow-up visit. Symptoms include excessive worry, nervous/anxious behavior and restlessness. Patient reports no decreased concentration, irritability or shortness of breath. Symptoms occur rarely. The severity of symptoms is mild.   Her past medical history is significant for asthma.  Diabetes She presents for her follow-up diabetic visit. She has type 2 diabetes mellitus. Hypoglycemia symptoms include nervousness/anxiousness. Associated symptoms include blurred vision. Pertinent negatives for diabetes include no foot paresthesias. Risk factors for coronary artery disease include dyslipidemia, diabetes mellitus and hypertension. She is following a generally healthy diet. Her overall blood glucose range is 90-110 mg/dl. (Taking it once a week )      Review of Systems  Constitutional:  Negative for irritability.  Eyes:  Positive for blurred vision.  Respiratory:  Negative for cough, shortness of breath and wheezing.   Gastrointestinal:  Positive for heartburn.  Psychiatric/Behavioral:  Negative for decreased concentration. The patient is nervous/anxious.   All other systems reviewed and are negative.  Family History  Problem Relation Age of Onset   Allergic rhinitis Mother    Asthma Mother    Rheum arthritis Mother    Fibromyalgia Mother    Cancer Mother        breast, uterine, adenocarcinoma-right lung   Uterine cancer Mother        Sarcoma   Lung cancer Mother        Adenocarcinoma   Breast cancer Mother 91   Hypertension Father    Melanoma Father 60   Asthma Sister  Asthma Maternal Aunt    Diabetes Maternal Aunt    Cancer Maternal Aunt 21       GYN cancer - possibly vulvar?   Lung cancer Maternal Aunt    Diabetes Maternal Uncle    Appendicitis Maternal Uncle        d. 12   Diabetes Paternal Aunt    Breast cancer Paternal Aunt 62   Breast cancer Paternal Aunt 57   Uterine cancer Paternal Aunt 20   Breast cancer Paternal Aunt 58    Diabetes Paternal Uncle    Other Maternal Grandmother 30       childbirth   Heart disease Maternal Grandfather    Cancer Paternal Grandmother        unknown   Other Daughter        Phyllodes tumor   Other Nephew        Gilbert's Disease   Colon cancer Neg Hx    Esophageal cancer Neg Hx    Ulcerative colitis Neg Hx    Stomach cancer Neg Hx    Pancreatic cancer Neg Hx    Social History   Socioeconomic History   Marital status: Married    Spouse name: Not on file   Number of children: 1   Years of education: Not on file   Highest education level: Not on file  Occupational History   Not on file  Tobacco Use   Smoking status: Never    Passive exposure: Never   Smokeless tobacco: Never  Vaping Use   Vaping status: Never Used  Substance and Sexual Activity   Alcohol use: No    Alcohol/week: 0.0 standard drinks of alcohol   Drug use: No   Sexual activity: Yes    Partners: Male    Birth control/protection: Surgical    Comment: hyst.-1st intercourse 19 yo-1 partner, menarche 16yo  Other Topics Concern   Not on file  Social History Narrative   Not on file   Social Drivers of Health   Financial Resource Strain: Low Risk  (08/28/2024)   Overall Financial Resource Strain (CARDIA)    Difficulty of Paying Living Expenses: Not hard at all  Food Insecurity: No Food Insecurity (08/28/2024)   Hunger Vital Sign    Worried About Running Out of Food in the Last Year: Never true    Ran Out of Food in the Last Year: Never true  Transportation Needs: No Transportation Needs (08/28/2024)   PRAPARE - Administrator, Civil Service (Medical): No    Lack of Transportation (Non-Medical): No  Physical Activity: Insufficiently Active (08/28/2024)   Exercise Vital Sign    Days of Exercise per Week: 1 day    Minutes of Exercise per Session: 10 min  Stress: No Stress Concern Present (08/28/2024)   [employer] of Occupational Health - Occupational Stress Questionnaire     Feeling of Stress: Only a little  Social Connections: Socially Integrated (08/28/2024)   Social Connection and Isolation Panel    Frequency of Communication with Friends and Family: More than three times a week    Frequency of Social Gatherings with Friends and Family: Three times a week    Attends Religious Services: More than 4 times per year    Active Member of Clubs or Organizations: Yes    Attends Banker Meetings: More than 4 times per year    Marital Status: Married        Objective:   Physical Exam Vitals reviewed.  Constitutional:  General: She is not in acute distress.    Appearance: She is well-developed. She is obese.  HENT:     Head: Normocephalic and atraumatic.     Right Ear: Tympanic membrane normal.     Left Ear: Tympanic membrane normal.  Eyes:     Pupils: Pupils are equal, round, and reactive to light.  Neck:     Thyroid : No thyromegaly.  Cardiovascular:     Rate and Rhythm: Normal rate and regular rhythm.     Heart sounds: Normal heart sounds. No murmur heard. Pulmonary:     Effort: Pulmonary effort is normal. No respiratory distress.     Breath sounds: Normal breath sounds. No wheezing.  Abdominal:     General: Bowel sounds are normal. There is no distension.     Palpations: Abdomen is soft.     Tenderness: There is no abdominal tenderness.  Musculoskeletal:        General: No tenderness. Normal range of motion.     Cervical back: Normal range of motion and neck supple.  Skin:    General: Skin is warm and dry.  Neurological:     Mental Status: She is alert and oriented to person, place, and time.     Cranial Nerves: No cranial nerve deficit.     Deep Tendon Reflexes: Reflexes are normal and symmetric.  Psychiatric:        Behavior: Behavior normal.        Thought Content: Thought content normal.        Judgment: Judgment normal.       BP 119/78   Pulse 89   Temp (!) 97.1 F (36.2 C) (Temporal)   Ht 5' 5 (1.651 m)   Wt  166 lb 9.6 oz (75.6 kg)   LMP 07/24/2010   SpO2 98%   BMI 27.72 kg/m      Assessment & Plan:  Yalitza Teed comes in today with chief complaint of Medical Management of Chronic Issues   Diagnosis and orders addressed:  1. Pure hypercholesterolemia - atorvastatin  (LIPITOR) 10 MG tablet; Take 1 tablet (10 mg total) by mouth daily.  Dispense: 90 tablet; Refill: 0 - CMP14+EGFR - CBC with Differential/Platelet - Lipid panel  2. GAD (generalized anxiety disorder) - escitalopram  (LEXAPRO ) 20 MG tablet; Take 1 tablet (20 mg total) by mouth daily.  Dispense: 90 tablet; Refill: 1 - CMP14+EGFR - CBC with Differential/Platelet  3. Cervical radiculopathy - meloxicam  (MOBIC ) 15 MG tablet; Take 1 tablet (15 mg total) by mouth daily.  Dispense: 90 tablet; Refill: 0 - CMP14+EGFR - CBC with Differential/Platelet  4. Type 2 diabetes mellitus with other specified complication, without long-term current use of insulin (HCC) - Semaglutide , 1 MG/DOSE, (OZEMPIC , 1 MG/DOSE,) 4 MG/3ML SOPN; INJECT 1 MG ONCE A WEEK AS DIRECTED  Dispense: 9 mL; Refill: 1 - CMP14+EGFR - CBC with Differential/Platelet - Bayer DCA Hb A1c Waived - Lipid panel - TSH - Vitamin B12  5. Encounter for immunization - Flu vaccine trivalent PF, 6mos and older(Flulaval,Afluria,Fluarix,Fluzone) - CMP14+EGFR - CBC with Differential/Platelet  6. Mild persistent asthma without complication - CMP14+EGFR - CBC with Differential/Platelet  7. Gastroesophageal reflux disease, unspecified whether esophagitis present - CMP14+EGFR - CBC with Differential/Platelet  8. Obesity (BMI 30-39.9)  - CMP14+EGFR - CBC with Differential/Platelet  9. Vitamin D  deficiency - CMP14+EGFR - CBC with Differential/Platelet - VITAMIN D  25 Hydroxy (Vit-D Deficiency, Fractures)  10. Annual physical exam (Primary) - CMP14+EGFR - CBC with Differential/Platelet - Bayer DCA Hb  A1c Waived - Lipid panel - TSH - Vitamin B12   Labs  pending Continue to force fluids Increase Ozempic  to 1 mg from 0.5 mg Stress management  Health Maintenance reviewed Diet and exercise encouraged  Follow up plan: 3 months    Bari Learn, FNP

## 2024-08-29 ENCOUNTER — Ambulatory Visit: Payer: Self-pay | Admitting: Family

## 2024-08-29 LAB — LIPID PANEL
Chol/HDL Ratio: 3.3 ratio (ref 0.0–4.4)
Cholesterol, Total: 160 mg/dL (ref 100–199)
HDL: 49 mg/dL (ref >39)
LDL Chol Calc (NIH): 79 mg/dL (ref 0–99)
Triglycerides: 191 mg/dL — ABNORMAL HIGH (ref 0–149)
VLDL Cholesterol Cal: 32 mg/dL (ref 5–40)

## 2024-08-29 LAB — CBC WITH DIFFERENTIAL/PLATELET
Basophils Absolute: 0.1 x10E3/uL (ref 0.0–0.2)
Basos: 1 %
EOS (ABSOLUTE): 0.4 x10E3/uL (ref 0.0–0.4)
Eos: 5 %
Hematocrit: 40.6 % (ref 34.0–46.6)
Hemoglobin: 13.3 g/dL (ref 11.1–15.9)
Immature Grans (Abs): 0 x10E3/uL (ref 0.0–0.1)
Immature Granulocytes: 0 %
Lymphocytes Absolute: 1.9 x10E3/uL (ref 0.7–3.1)
Lymphs: 26 %
MCH: 30 pg (ref 26.6–33.0)
MCHC: 32.8 g/dL (ref 31.5–35.7)
MCV: 91 fL (ref 79–97)
Monocytes Absolute: 0.5 x10E3/uL (ref 0.1–0.9)
Monocytes: 7 %
Neutrophils Absolute: 4.7 x10E3/uL (ref 1.4–7.0)
Neutrophils: 61 %
Platelets: 280 x10E3/uL (ref 150–450)
RBC: 4.44 x10E6/uL (ref 3.77–5.28)
RDW: 12.9 % (ref 11.7–15.4)
WBC: 7.6 x10E3/uL (ref 3.4–10.8)

## 2024-08-29 LAB — CMP14+EGFR
ALT: 23 IU/L (ref 0–32)
AST: 24 IU/L (ref 0–40)
Albumin: 4.3 g/dL (ref 3.9–4.9)
Alkaline Phosphatase: 156 IU/L — ABNORMAL HIGH (ref 49–135)
BUN/Creatinine Ratio: 13 (ref 12–28)
BUN: 11 mg/dL (ref 8–27)
Bilirubin Total: 0.4 mg/dL (ref 0.0–1.2)
CO2: 22 mmol/L (ref 20–29)
Calcium: 10 mg/dL (ref 8.7–10.3)
Chloride: 103 mmol/L (ref 96–106)
Creatinine, Ser: 0.82 mg/dL (ref 0.57–1.00)
Globulin, Total: 2.4 g/dL (ref 1.5–4.5)
Glucose: 90 mg/dL (ref 70–99)
Potassium: 4.6 mmol/L (ref 3.5–5.2)
Sodium: 139 mmol/L (ref 134–144)
Total Protein: 6.7 g/dL (ref 6.0–8.5)
eGFR: 81 mL/min/1.73 (ref >59)

## 2024-08-29 LAB — TSH: TSH: 1.91 u[IU]/mL (ref 0.450–4.500)

## 2024-08-29 LAB — VITAMIN B12: Vitamin B-12: 499 pg/mL (ref 232–1245)

## 2024-08-29 LAB — VITAMIN D 25 HYDROXY (VIT D DEFICIENCY, FRACTURES): Vit D, 25-Hydroxy: 33.8 ng/mL (ref 30.0–100.0)

## 2024-10-01 ENCOUNTER — Telehealth: Admitting: Physician Assistant

## 2024-10-01 DIAGNOSIS — J069 Acute upper respiratory infection, unspecified: Secondary | ICD-10-CM | POA: Diagnosis not present

## 2024-10-01 MED ORDER — IPRATROPIUM BROMIDE 0.03 % NA SOLN: 2.0000 | Freq: Two times a day (BID) | NASAL | 0 refills | Status: DC

## 2024-10-01 MED ORDER — PROMETHAZINE-DM 6.25-15 MG/5ML PO SYRP: 5.0000 mL | ORAL_SOLUTION | Freq: Four times a day (QID) | ORAL | 0 refills | Status: DC | PRN

## 2024-10-01 NOTE — Progress Notes (Signed)

## 2024-10-03 ENCOUNTER — Other Ambulatory Visit (HOSPITAL_COMMUNITY): Payer: Self-pay

## 2024-10-20 ENCOUNTER — Ambulatory Visit: Admitting: Nurse Practitioner

## 2024-10-20 ENCOUNTER — Encounter: Payer: Self-pay | Admitting: Nurse Practitioner

## 2024-10-20 VITALS — BP 118/78 | HR 77

## 2024-10-20 DIAGNOSIS — L292 Pruritus vulvae: Secondary | ICD-10-CM

## 2024-10-20 DIAGNOSIS — R339 Retention of urine, unspecified: Secondary | ICD-10-CM

## 2024-10-20 DIAGNOSIS — B3731 Acute candidiasis of vulva and vagina: Secondary | ICD-10-CM | POA: Diagnosis not present

## 2024-10-20 LAB — URINALYSIS, COMPLETE W/RFL CULTURE
Bacteria, UA: NONE SEEN /HPF
Bilirubin Urine: NEGATIVE
Glucose, UA: NEGATIVE
Hgb urine dipstick: NEGATIVE
Hyaline Cast: NONE SEEN /LPF
Ketones, ur: NEGATIVE
Leukocyte Esterase: NEGATIVE
Nitrites, Initial: NEGATIVE
Protein, ur: NEGATIVE
RBC / HPF: NONE SEEN /HPF (ref 0–2)
Specific Gravity, Urine: 1.015 (ref 1.001–1.035)
WBC, UA: NONE SEEN /HPF (ref 0–5)
pH: 7 (ref 5.0–8.0)

## 2024-10-20 LAB — WET PREP FOR TRICH, YEAST, CLUE

## 2024-10-20 LAB — NO CULTURE INDICATED

## 2024-10-20 MED ORDER — FLUCONAZOLE 150 MG PO TABS: 150.0000 mg | ORAL_TABLET | ORAL | 0 refills | Status: DC

## 2024-10-20 NOTE — Progress Notes (Signed)
   Acute Office Visit  Subjective:    Patient ID: Carla Morales, female    DOB: [birth year], 63 y.o.   MRN: [id number]   HPI 63 y.o. presents today for itching and irritation for about a month and a half. Also reports not urinating as often and a sensation when she does urinate. No changes in soaps or detergents. Denies discharge or odor.   Patient's last menstrual period was 07/24/2010.    Review of Systems  Constitutional: Negative.   Eyes: Negative.   Genitourinary:  Positive for difficulty urinating, urgency and vaginal pain. Negative for dysuria, flank pain, frequency, hematuria, pelvic pain and vaginal discharge.       Objective:    Physical Exam Exam conducted with a chaperone present.  Constitutional:      Appearance: Normal appearance.  Genitourinary:    Vagina: Normal.      Comments: Generalized redness from clitoral hood to anus    BP 118/78   Pulse 77   LMP 07/24/2010   SpO2 98%  Wt Readings from Last 3 Encounters:  08/28/24 166 lb 9.6 oz (75.6 kg)  08/11/24 166 lb 14.4 oz (75.7 kg)  07/15/24 170 lb 9.6 oz (77.4 kg)        Zada Louder, CMA present as biomedical engineer.   Wet prep + yeast (with budding)  UA negative  Assessment & Plan:   Problem List Items Addressed This Visit   None Visit Diagnoses       Vaginal candidiasis    -  Primary   Relevant Medications   fluconazole  (DIFLUCAN ) 150 MG tablet     Urinary retention       Relevant Orders   Urinalysis,Complete w/RFL Culture     Vulvar itching       Relevant Orders   WET PREP FOR TRICH, YEAST, CLUE      Plan: Diflucan  150 mg every 3 days x 2 doses. UA negative.   Return if symptoms worsen or fail to improve.    Annabella DELENA Shutter DNP, 10:44 AM 10/20/2024

## 2024-10-27 ENCOUNTER — Other Ambulatory Visit: Payer: Self-pay

## 2024-10-27 DIAGNOSIS — K200 Eosinophilic esophagitis: Secondary | ICD-10-CM

## 2024-10-27 MED ORDER — DUPIXENT 300 MG/2ML ~~LOC~~ SOAJ: SUBCUTANEOUS | 11 refills | Status: DC

## 2024-10-29 ENCOUNTER — Encounter: Payer: Self-pay | Admitting: Nurse Practitioner

## 2024-10-29 ENCOUNTER — Other Ambulatory Visit: Payer: Self-pay | Admitting: Nurse Practitioner

## 2024-10-29 DIAGNOSIS — B3731 Acute candidiasis of vulva and vagina: Secondary | ICD-10-CM

## 2024-10-29 MED ORDER — TERCONAZOLE 0.8 % VA CREA: 1.0000 | TOPICAL_CREAM | Freq: Every day | VAGINAL | 0 refills | Status: AC

## 2024-10-29 NOTE — Telephone Encounter (Signed)
 Routing to Tiffany to review and advise.  Patient was seen in office on 10/20/24

## 2024-11-17 ENCOUNTER — Other Ambulatory Visit: Payer: Self-pay

## 2024-11-17 DIAGNOSIS — K200 Eosinophilic esophagitis: Secondary | ICD-10-CM

## 2024-11-17 MED ORDER — DUPIXENT 300 MG/2ML ~~LOC~~ SOAJ: SUBCUTANEOUS | 11 refills | Status: AC

## 2024-11-20 ENCOUNTER — Ambulatory Visit: Payer: Self-pay

## 2024-11-20 NOTE — Telephone Encounter (Signed)
 Please call patient if there is a cancellation today. Pt scheduled for tomorrow but advised to go to UC or call us  back if anything changes or worsens.   FYI Only or Action Required?: FYI only for provider: appointment scheduled on 11/21/24.  Patient was last seen in primary care on 08/28/2024 by Lavell Bari LABOR, FNP.  Called Nurse Triage reporting Cough.  Symptoms began Christmas.  Interventions attempted: Prescription medications: rescue inhaler, dulera , cough drops.  Symptoms are: unchanged.  Triage Disposition: See Physician Within 24 Hours  Patient/caregiver understands and will follow disposition?: Yes  Reason for Disposition  [1] Continuous (nonstop) coughing interferes with work or school AND [2] no improvement using cough treatment per Care Advice  Answer Assessment - Initial Assessment Questions 1. ONSET: When did the cough begin?      Christmas  2. SEVERITY: How bad is the cough today?      Pt reports severe coughing spells  3. SPUTUM: Describe the color of your sputum (e.g., none, dry cough; clear, white, yellow, green)     Green/clear  4. HEMOPTYSIS: Are you coughing up any blood? If Yes, ask: How much? (e.g., flecks, streaks, tablespoons, etc.)     Denies  5. DIFFICULTY BREATHING: Are you having difficulty breathing? If Yes, ask: How bad is it? (e.g., mild, moderate, severe)      Reports SOB during coughing spells, reports her rescue inhaler offers temporary relief. Pt speaking in full sentences over the phone and pausing to cough.   6. FEVER: Do you have a fever? If Yes, ask: What is your temperature, how was it measured, and when did it start?     Denies   8. LUNG HISTORY: Do you have any history of lung disease?  (e.g., pulmonary embolus, asthma, emphysema)     Asthma   10. OTHER SYMPTOMS: Do you have any other symptoms? (e.g., runny nose, wheezing, chest pain) Back hurting, chest hurting mainly when I cough, fatigue  Protocols  used: Cough - Acute Productive-A-AH

## 2024-11-20 NOTE — Telephone Encounter (Signed)
 Noted

## 2024-11-21 ENCOUNTER — Ambulatory Visit

## 2024-11-21 ENCOUNTER — Other Ambulatory Visit: Payer: Self-pay | Admitting: Family Medicine

## 2024-11-21 ENCOUNTER — Encounter: Payer: Self-pay | Admitting: Family Medicine

## 2024-11-21 ENCOUNTER — Ambulatory Visit: Admitting: Family Medicine

## 2024-11-21 VITALS — BP 104/69 | HR 90 | Temp 98.3°F | Ht 65.0 in | Wt 156.0 lb

## 2024-11-21 DIAGNOSIS — R053 Chronic cough: Secondary | ICD-10-CM

## 2024-11-21 MED ORDER — PREDNISONE 20 MG PO TABS: ORAL_TABLET | ORAL | 0 refills | Status: DC

## 2024-11-21 MED ORDER — PSEUDOEPHEDRINE-DM-GG 30-10-100 MG/5ML PO LIQD: 10.0000 mL | Freq: Four times a day (QID) | ORAL | 0 refills | Status: DC | PRN

## 2024-11-21 MED ORDER — METHYLPREDNISOLONE ACETATE 80 MG/ML IJ SUSP
80.0000 mg | Freq: Once | INTRAMUSCULAR | Status: AC
  Administered 2024-11-21: 80 mg via INTRAMUSCULAR

## 2024-11-21 MED ORDER — FLUCONAZOLE 150 MG PO TABS: 150.0000 mg | ORAL_TABLET | Freq: Once | ORAL | 0 refills | Status: AC

## 2024-11-21 NOTE — Telephone Encounter (Signed)
 Sent TheraFlu replacement

## 2024-11-21 NOTE — Telephone Encounter (Signed)
" °  THERAFLU-D FLU/NASAL DECONGEST 60-1000-30 MG/30ML LIQD        Changed from: pseudoephedrine -dextromethorphan-guaifenesin (ROBITUSSIN-PE) 30-10-100 MG/5ML solution   Pharmacy comment: Alternative Requested:ROBITUSSIN PE (OR WITH PSE) NOT AVAILABLE; THERAFLU D FLU IS LIQUID WITH PSE, DXM, AND APAP CLOSEST LIQUID AVAILABLE BUT NO GUAIFENESIN.  "

## 2024-11-21 NOTE — Addendum Note (Signed)
 Addended by: MARYANNE CHEW on: 11/21/2024 09:55 AM   Modules accepted: Orders

## 2024-11-21 NOTE — Progress Notes (Signed)
 "  BP 104/69   Pulse 90   Temp 98.3 F (36.8 C)   Ht 5' 5 (1.651 m)   Wt 156 lb (70.8 kg)   LMP 07/24/2010   SpO2 97%   BMI 25.96 kg/m    Subjective:   Patient ID: Carla Morales, female    DOB: 1960-12-29, 64 y.o.   MRN: [id number]  HPI: Carla Morales is a 64 y.o. female presenting on 11/21/2024 for Cough and Fatigue   Discussed the use of AI scribe software for clinical note transcription with the patient, who gave verbal consent to proceed.  History of Present Illness   Carla Morales is a 64 year old female with asthma who presents with persistent cough and wheezing following a suspected flu infection.  Respiratory symptoms - Persistent cough since the holiday season, initially triggered by exposure to cigar smoke - Prolonged coughing spells lasting 30 to 45 minutes, including an episode from 3:30 to 5:00 AM - Wheezing accompanies coughing spells - No chest pain or shortness of breath reported  Asthma exacerbation - Asthma symptoms worsened over the holiday season - Increased use of albuterol  and Dulera  inhalers - Use of nasal sprays for symptom relief - Exposure to cigar smoke typically triggers asthma symptoms  Fever and constitutional symptoms - Fever of 102F on Christmas Day - Low-grade fever of 100F the night before the visit - No high fevers since Christmas Day - Fatigue similar to previous influenza episodes  Suspected influenza infection - Onset of symptoms coincided with granddaughter testing positive for influenza - Did not seek medical attention at the time of initial illness  Medication use and response - Current medications include albuterol , Dulera , and nasal sprays - Mucinex Max used for cough - Previous use of Hycodan and promethazine  cough syrup, both ineffective  Oropharyngeal and other symptoms - Suspected oral yeast infection (thrush) possibly related to increased Dulera  use - Soreness in ears and across the back           Relevant past medical, surgical, family and social history reviewed and updated as indicated. Interim medical history since our last visit reviewed. Allergies and medications reviewed and updated.  Review of Systems  Constitutional:  Negative for chills and fever.  HENT:  Positive for congestion, postnasal drip and sneezing. Negative for ear discharge, ear pain, rhinorrhea, sinus pressure and sore throat.   Eyes:  Negative for pain, redness and visual disturbance.  Respiratory:  Positive for cough and wheezing. Negative for chest tightness and shortness of breath.   Cardiovascular:  Negative for chest pain and leg swelling.  Musculoskeletal:  Negative for back pain and gait problem.  Skin:  Negative for rash.  Neurological:  Negative for light-headedness and headaches.  Psychiatric/Behavioral:  Negative for agitation and behavioral problems.   All other systems reviewed and are negative.   Per HPI unless specifically indicated above   Allergies as of 11/21/2024       Reactions   Other Swelling   Contrast dye   Contrast Media [iodinated Contrast Media]    Dust Mite Extract    Haldol [haloperidol Decanoate]    anxiety   Molds & Smuts    Pravastatin  Other (See Comments)   myalgias   Sulfa Antibiotics Rash        Medication List        Accurate as of November 21, 2024  9:21 AM. If you have any questions, ask your nurse or doctor.  STOP taking these medications    nitroGLYCERIN  0.4 MG SL tablet Commonly known as: NITROSTAT  Stopped by: Fonda Levins, MD       TAKE these medications    albuterol  108 (90 Base) MCG/ACT inhaler Commonly known as: Ventolin  HFA Inhale 2 puffs into the lungs every 4 (four) hours as needed for wheezing or shortness of breath.   atorvastatin  10 MG tablet Commonly known as: LIPITOR Take 1 tablet (10 mg total) by mouth daily.   azelastine  0.1 % nasal spray Commonly known as: ASTELIN  Place 2 sprays into both nostrils 2  (two) times daily as needed for rhinitis. Use in each nostril as directed   baclofen  10 MG tablet Commonly known as: LIORESAL  Take 1 tablet (10 mg total) by mouth 3 (three) times daily.   dexlansoprazole  60 MG capsule Commonly known as: DEXILANT  Take 1 capsule (60 mg total) by mouth daily.   Dulera  100-5 MCG/ACT Aero Generic drug: mometasone -formoterol  Inhale 2 puffs into the lungs daily. Increase to twice daily during asthma flares and continue for 1-2 weeks or until flare resolves.   Dupixent  300 MG/2ML Soaj Generic drug: Dupilumab  INJECT 1 PEN (300 MG TOTAL) UNDER THE SKIN ONCE A WEEK   escitalopram  20 MG tablet Commonly known as: Lexapro  Take 1 tablet (20 mg total) by mouth daily.   fluconazole  150 MG tablet Commonly known as: DIFLUCAN  Take 1 tablet (150 mg total) by mouth every 3 (three) days. What changed: Another medication with the same name was added. Make sure you understand how and when to take each. Changed by: Fonda Levins, MD   fluconazole  150 MG tablet Commonly known as: DIFLUCAN  Take 1 tablet (150 mg total) by mouth once for 1 dose. What changed: You were already taking a medication with the same name, and this prescription was added. Make sure you understand how and when to take each. Changed by: Fonda Levins, MD   fluticasone  50 MCG/ACT nasal spray Commonly known as: FLONASE  Place 2 sprays into both nostrils daily.   ipratropium 0.03 % nasal spray Commonly known as: ATROVENT  SMARTSIG:2 Spray(s) Both Nares Every 12 Hours   levocetirizine 5 MG tablet Commonly known as: XYZAL Take 5 mg by mouth in the morning.   meloxicam  15 MG tablet Commonly known as: MOBIC  Take 1 tablet (15 mg total) by mouth daily.   MULTIVITAMIN PO Take by mouth.   Ozempic  (1 MG/DOSE) 4 MG/3ML Sopn Generic drug: Semaglutide  (1 MG/DOSE) INJECT 1 MG ONCE A WEEK AS DIRECTED   predniSONE  20 MG tablet Commonly known as: DELTASONE  2 po at same time daily for 5  days Started by: Fonda Levins, MD   promethazine -dextromethorphan 6.25-15 MG/5ML syrup Commonly known as: PROMETHAZINE -DM Take 5 mLs by mouth 4 (four) times daily as needed for cough.   terconazole  0.8 % vaginal cream Commonly known as: TERAZOL 3  Place 1 applicator vaginally at bedtime.         Objective:   BP 104/69   Pulse 90   Temp 98.3 F (36.8 C)   Ht 5' 5 (1.651 m)   Wt 156 lb (70.8 kg)   LMP 07/24/2010   SpO2 97%   BMI 25.96 kg/m   Wt Readings from Last 3 Encounters:  11/21/24 156 lb (70.8 kg)  08/28/24 166 lb 9.6 oz (75.6 kg)  08/11/24 166 lb 14.4 oz (75.7 kg)    Physical Exam Vitals and nursing note reviewed.  Constitutional:      Appearance: Normal appearance. She is not ill-appearing.  Cardiovascular:     Rate and Rhythm: Normal rate and regular rhythm.  Pulmonary:     Effort: Pulmonary effort is normal. No respiratory distress.     Breath sounds: No stridor. Wheezing present. No rhonchi or rales.  Chest:     Chest wall: No tenderness.  Neurological:     Mental Status: She is alert.    Physical Exam   CHEST: End expiratory wheeze present. Slightly expanded lungs, no consolidation.         Assessment & Plan:   Problem List Items Addressed This Visit   None Visit Diagnoses       Persistent cough    -  Primary   Relevant Medications   methylPREDNISolone  acetate (DEPO-MEDROL ) injection 80 mg (Start on 11/21/2024  9:30 AM)   predniSONE  (DELTASONE ) 20 MG tablet   Other Relevant Orders   DG Chest 2 View          Asthma with acute exacerbation Acute exacerbation likely triggered by recent flu infection. Chest x-ray shows hyperinflation without consolidation. - Administered steroid injection. - Prescribed oral prednisone . - Monitor blood glucose levels due to steroid impact on diabetes.  Type 2 diabetes mellitus Potential impact of steroid treatment on blood glucose levels. - Monitor blood glucose levels closely during steroid  treatment.  Oral candidiasis Likely secondary to increased use of Dulera  inhaler.      Depo-Medrol  intramuscular 80 mg given    Follow up plan: Return if symptoms worsen or fail to improve.  Counseling provided for all of the vaccine components Orders Placed This Encounter  Procedures   DG Chest 2 View    Fonda Levins, MD [facility] 11/21/2024, 9:21 AM     "

## 2024-11-28 ENCOUNTER — Ambulatory Visit: Payer: Self-pay | Admitting: Family Medicine

## 2024-12-01 ENCOUNTER — Ambulatory Visit: Payer: Self-pay | Admitting: Family

## 2024-12-01 ENCOUNTER — Encounter: Payer: Self-pay | Admitting: Family

## 2024-12-01 VITALS — BP 115/74 | HR 90 | Temp 97.5°F | Ht 65.0 in | Wt 157.4 lb

## 2024-12-01 DIAGNOSIS — K219 Gastro-esophageal reflux disease without esophagitis: Secondary | ICD-10-CM | POA: Diagnosis not present

## 2024-12-01 DIAGNOSIS — E1169 Type 2 diabetes mellitus with other specified complication: Secondary | ICD-10-CM

## 2024-12-01 DIAGNOSIS — E7800 Pure hypercholesterolemia, unspecified: Secondary | ICD-10-CM | POA: Diagnosis not present

## 2024-12-01 DIAGNOSIS — J4530 Mild persistent asthma, uncomplicated: Secondary | ICD-10-CM | POA: Diagnosis not present

## 2024-12-01 DIAGNOSIS — Z7985 Long-term (current) use of injectable non-insulin antidiabetic drugs: Secondary | ICD-10-CM

## 2024-12-01 DIAGNOSIS — F411 Generalized anxiety disorder: Secondary | ICD-10-CM | POA: Diagnosis not present

## 2024-12-01 DIAGNOSIS — E663 Overweight: Secondary | ICD-10-CM

## 2024-12-01 DIAGNOSIS — E559 Vitamin D deficiency, unspecified: Secondary | ICD-10-CM | POA: Diagnosis not present

## 2024-12-01 LAB — CMP14+EGFR
ALT: 19 IU/L (ref 0–32)
AST: 12 IU/L (ref 0–40)
Albumin: 3.9 g/dL (ref 3.9–4.9)
Alkaline Phosphatase: 138 IU/L — ABNORMAL HIGH (ref 49–135)
BUN/Creatinine Ratio: 15 (ref 12–28)
BUN: 11 mg/dL (ref 8–27)
Bilirubin Total: 0.5 mg/dL (ref 0.0–1.2)
CO2: 24 mmol/L (ref 20–29)
Calcium: 9.8 mg/dL (ref 8.7–10.3)
Chloride: 103 mmol/L (ref 96–106)
Creatinine, Ser: 0.71 mg/dL (ref 0.57–1.00)
Globulin, Total: 2.8 g/dL (ref 1.5–4.5)
Glucose: 92 mg/dL (ref 70–99)
Potassium: 4.3 mmol/L (ref 3.5–5.2)
Sodium: 141 mmol/L (ref 134–144)
Total Protein: 6.7 g/dL (ref 6.0–8.5)
eGFR: 97 mL/min/1.73

## 2024-12-01 NOTE — Progress Notes (Signed)
 "  Subjective:    Patient ID: Carla Morales, female    DOB: 03/09/1961, 64 y.o.   MRN: [id number]  Chief Complaint  Patient presents with   Medical Management of Chronic Issues   Pt presents to the office today for chronic follow up.   She is followed by GYN annually.    She is followed by GI for GERD and EOE.   She is followed by ENT for hoarse voice.   Also, asthma and allergen every 6 months. Her breathing is controlled.   She is currently Ozempic  1 mg. Her starting weight was 195 lb. She has lost 38 lbs.      12/01/2024    9:24 AM 11/21/2024    9:03 AM 08/28/2024    8:54 AM  Last 3 Weights  Weight (lbs) 157 lb 6.4 oz 156 lb 166 lb 9.6 oz  Weight (kg) 71.396 kg 70.761 kg 75.569 kg     Asthma She complains of cough. There is no shortness of breath or wheezing. This is a chronic problem. The current episode started more than 1 year ago. The problem occurs intermittently. Associated symptoms include heartburn. Her symptoms are alleviated by rest and beta-agonist. She reports moderate improvement on treatment. Her past medical history is significant for asthma.  Gastroesophageal Reflux She complains of belching, coughing and heartburn. She reports no wheezing. This is a chronic problem. The current episode started more than 1 year ago. The problem occurs occasionally. The symptoms are aggravated by certain foods. Risk factors include obesity. She has tried a PPI for the symptoms. The treatment provided moderate relief.  Hyperlipidemia This is a chronic problem. The current episode started more than 1 year ago. The problem is controlled. Recent lipid tests were reviewed and are normal. Exacerbating diseases include obesity. Pertinent negatives include no shortness of breath. Current antihyperlipidemic treatment includes statins. The current treatment provides moderate improvement of lipids. Risk factors for coronary artery disease include dyslipidemia, hypertension, a sedentary  lifestyle and post-menopausal.  Anxiety Presents for follow-up visit. Symptoms include excessive worry, nervous/anxious behavior and restlessness. Patient reports no decreased concentration, irritability or shortness of breath. Symptoms occur occasionally. The severity of symptoms is mild.   Her past medical history is significant for asthma.  Diabetes She presents for her follow-up diabetic visit. She has type 2 diabetes mellitus. Hypoglycemia symptoms include nervousness/anxiousness. Pertinent negatives for diabetes include no blurred vision and no foot paresthesias. Risk factors for coronary artery disease include dyslipidemia, diabetes mellitus and hypertension. She is following a generally healthy diet. Her overall blood glucose range is 90-110 mg/dl. (Taking it once a week )      Review of Systems  Constitutional:  Negative for irritability.  Eyes:  Negative for blurred vision.  Respiratory:  Positive for cough. Negative for shortness of breath and wheezing.   Gastrointestinal:  Positive for heartburn.  Psychiatric/Behavioral:  Negative for decreased concentration. The patient is nervous/anxious.   All other systems reviewed and are negative.  Family History  Problem Relation Age of Onset   Allergic rhinitis Mother    Asthma Mother    Rheum arthritis Mother    Fibromyalgia Mother    Cancer Mother        breast, uterine, adenocarcinoma-right lung   Uterine cancer Mother        Sarcoma   Lung cancer Mother        Adenocarcinoma   Breast cancer Mother 27   Hypertension Father    Melanoma Father 78  Asthma Sister    Asthma Maternal Aunt    Diabetes Maternal Aunt    Cancer Maternal Aunt 47       GYN cancer - possibly vulvar?   Lung cancer Maternal Aunt    Diabetes Maternal Uncle    Appendicitis Maternal Uncle        d. 12   Diabetes Paternal Aunt    Breast cancer Paternal Aunt 3   Breast cancer Paternal Aunt 29   Uterine cancer Paternal Aunt 66   Breast cancer  Paternal Aunt 55   Diabetes Paternal Uncle    Other Maternal Grandmother 30       childbirth   Heart disease Maternal Grandfather    Cancer Paternal Grandmother        unknown   Other Daughter        Phyllodes tumor   Other Nephew        Gilbert's Disease   Colon cancer Neg Hx    Esophageal cancer Neg Hx    Ulcerative colitis Neg Hx    Stomach cancer Neg Hx    Pancreatic cancer Neg Hx    Social History   Socioeconomic History   Marital status: Married    Spouse name: Not on file   Number of children: 1   Years of education: Not on file   Highest education level: Not on file  Occupational History   Not on file  Tobacco Use   Smoking status: Never    Passive exposure: Never   Smokeless tobacco: Never  Vaping Use   Vaping status: Never Used  Substance and Sexual Activity   Alcohol use: No    Alcohol/week: 0.0 standard drinks of alcohol   Drug use: No   Sexual activity: Yes    Partners: Male    Birth control/protection: Surgical    Comment: hyst.-1st intercourse 19 yo-1 partner, menarche 16yo  Other Topics Concern   Not on file  Social History Narrative   Not on file   Social Drivers of Health   Tobacco Use: Low Risk (12/01/2024)   Patient History    Smoking Tobacco Use: Never    Smokeless Tobacco Use: Never    Passive Exposure: Never  Financial Resource Strain: Low Risk (08/28/2024)   Overall Financial Resource Strain (CARDIA)    Difficulty of Paying Living Expenses: Not hard at all  Food Insecurity: No Food Insecurity (08/28/2024)   Epic    Worried About Programme Researcher, Broadcasting/film/video in the Last Year: Never true    Ran Out of Food in the Last Year: Never true  Transportation Needs: No Transportation Needs (08/28/2024)   Epic    Lack of Transportation (Medical): No    Lack of Transportation (Non-Medical): No  Physical Activity: Insufficiently Active (08/28/2024)   Exercise Vital Sign    Days of Exercise per Week: 1 day    Minutes of Exercise per Session: 10 min   Stress: No Stress Concern Present (08/28/2024)   [employer] of Occupational Health - Occupational Stress Questionnaire    Feeling of Stress: Only a little  Social Connections: Socially Integrated (08/28/2024)   Social Connection and Isolation Panel    Frequency of Communication with Friends and Family: More than three times a week    Frequency of Social Gatherings with Friends and Family: Three times a week    Attends Religious Services: More than 4 times per year    Active Member of Clubs or Organizations: Yes    Attends Club or  Organization Meetings: More than 4 times per year    Marital Status: Married  Depression (PHQ2-9): Low Risk (12/01/2024)   Depression (PHQ2-9)    PHQ-2 Score: 0  Alcohol Screen: Not on file  Housing: Low Risk (08/28/2024)   Epic    Unable to Pay for Housing in the Last Year: No    Number of Times Moved in the Last Year: 0    Homeless in the Last Year: No  Utilities: Not on file  Health Literacy: Not on file        Objective:   Physical Exam Vitals reviewed.  Constitutional:      General: She is not in acute distress.    Appearance: She is well-developed. She is obese.  HENT:     Head: Normocephalic and atraumatic.     Right Ear: Tympanic membrane normal.     Left Ear: Tympanic membrane normal.  Eyes:     Pupils: Pupils are equal, round, and reactive to light.  Neck:     Thyroid : No thyromegaly.  Cardiovascular:     Rate and Rhythm: Normal rate and regular rhythm.     Heart sounds: Normal heart sounds. No murmur heard. Pulmonary:     Effort: Pulmonary effort is normal. No respiratory distress.     Breath sounds: Normal breath sounds. No wheezing.  Abdominal:     General: Bowel sounds are normal. There is no distension.     Palpations: Abdomen is soft.     Tenderness: There is no abdominal tenderness.  Musculoskeletal:        General: No tenderness. Normal range of motion.     Cervical back: Normal range of motion and neck supple.   Skin:    General: Skin is warm and dry.  Neurological:     Mental Status: She is alert and oriented to person, place, and time.     Cranial Nerves: No cranial nerve deficit.     Deep Tendon Reflexes: Reflexes are normal and symmetric.  Psychiatric:        Behavior: Behavior normal.        Thought Content: Thought content normal.        Judgment: Judgment normal.       BP 115/74   Pulse 90   Temp (!) 97.5 F (36.4 C) (Temporal)   Ht 5' 5 (1.651 m)   Wt 157 lb 6.4 oz (71.4 kg)   LMP 07/24/2010   SpO2 97%   BMI 26.19 kg/m      Assessment & Plan:  Samantha Ragen comes in today with chief complaint of Medical Management of Chronic Issues   Diagnosis and orders addressed:  1. Mild persistent asthma without complication - CMP14+EGFR  2. Type 2 diabetes mellitus with other specified complication, without long-term current use of insulin (HCC) (Primary) Continue Ozempic  1 mg - CMP14+EGFR  3. GAD (generalized anxiety disorder) - CMP14+EGFR  4. Gastroesophageal reflux disease, unspecified whether esophagitis present - CMP14+EGFR  5. Pure hypercholesterolemia - CMP14+EGFR  6. Overweight (BMI 25.0-29.9) - CMP14+EGFR  7. Vitamin D  deficiency - CMP14+EGFR   Labs pending Continue to force fluids Continue Ozempic   1 mg  Stress management  Health Maintenance reviewed Diet and exercise encouraged  Follow up plan: 3 months    Bari Learn, FNP   "

## 2024-12-01 NOTE — Patient Instructions (Signed)

## 2024-12-02 ENCOUNTER — Other Ambulatory Visit: Payer: Self-pay | Admitting: Family

## 2024-12-02 ENCOUNTER — Ambulatory Visit: Payer: Self-pay | Admitting: Family

## 2024-12-02 DIAGNOSIS — E7800 Pure hypercholesterolemia, unspecified: Secondary | ICD-10-CM

## 2024-12-17 ENCOUNTER — Ambulatory Visit: Admitting: Internal Medicine

## 2024-12-17 ENCOUNTER — Encounter: Payer: Self-pay | Admitting: Internal Medicine

## 2024-12-17 VITALS — BP 99/58 | HR 68 | Temp 97.3°F | Resp 14 | Ht 66.0 in | Wt 158.0 lb

## 2024-12-17 DIAGNOSIS — M2141 Flat foot [pes planus] (acquired), right foot: Secondary | ICD-10-CM

## 2024-12-17 DIAGNOSIS — M1712 Unilateral primary osteoarthritis, left knee: Secondary | ICD-10-CM

## 2024-12-17 DIAGNOSIS — R7689 Other specified abnormal immunological findings in serum: Secondary | ICD-10-CM | POA: Insufficient documentation

## 2024-12-17 NOTE — Progress Notes (Unsigned)
 "  Office Visit Note  Patient: Carla Morales             Date of Birth: 1961-09-28           MRN: [id number]             PCP: Lavell Bari LABOR, FNP Referring: [place], PA-C Visit Date: 12/17/2024 Occupation: Data Unavailable  Subjective:  New Patient (Initial Visit) (Abnormal labs)   History of Present Illness: Carla Morales is a 64 y.o. female ***   Discussed the use of AI scribe software for clinical note transcription with the patient, who gave verbal consent to proceed.  History of Present Illness   [name] is a 64 year old female who presents for evaluation of positive ANA and liver enzyme abnormalities. She was referred by another doctor due to abnormal lab tests, including a positive DNA test and abnormal liver enzyme markers, as well as fatty liver changes on imaging.  She has been on Dupixent  for about a year and Dexilant  for a longer period to manage esophagitis. She was off Dupixent  for about 1.5 to 2 months without a significant change in symptoms. She experiences allergy  symptoms, including a severe reaction to mold at work, which exacerbated her asthma and led her to see an allergist about a year before last. She takes Xyzal daily for itching, which remains consistent throughout the year.  She has difficulty sleeping in bed due to joint pain, particularly in her knees, hips, and shoulders, ongoing for a few years. She finds more comfort sleeping in a recliner. She does not take medication for sleep and experiences more knee pain when descending stairs. She had knee injections over a year ago and was told by urgent care that she had arthritis based on x-rays. She avoids taking ibuprofen or Tylenol  due to concerns about liver impact.  She has a history of swelling in her feet and ankles years ago, which resolved after taking a diuretic. She had her veins checked last year due to concerns from her dermatologist. No current swelling in her hands or  feet, circulation problems, or abnormal clotting or bleeding issues.  She experiences joint pain and stiffness, particularly in her knees and a toe on the left foot, which she attributes to osteoarthritis. She has not had any other joint procedures or surgeries. She avoids taking systemic pain medications due to liver concerns and has not used topical treatments like Voltaren.  She has a family history of rheumatoid arthritis and fibromyalgia, which raises her concern about her symptoms possibly being related to these conditions. Her mother had rheumatoid arthritis and fibromyalgia.  She reports dry eyes in the past but not currently. She experiences sinus issues frequently, which affects her sleep.       Activities of Daily Living:  Patient reports morning stiffness for 30 minutes.   Patient Reports nocturnal pain.  Difficulty dressing/grooming: Denies Difficulty climbing stairs: Reports Difficulty getting out of chair: Denies Difficulty using hands for taps, buttons, cutlery, and/or writing: Denies  Review of Systems  Constitutional:  Negative for fatigue.  HENT:  Negative for mouth sores and mouth dryness.   Eyes:  Negative for dryness.  Respiratory:  Negative for shortness of breath.   Cardiovascular:  Negative for chest pain and palpitations.  Gastrointestinal:  Negative for blood in stool, constipation and diarrhea.  Endocrine: Negative for increased urination.  Genitourinary:  Negative for involuntary urination.  Musculoskeletal:  Positive for joint pain, gait problem, joint  pain, myalgias, muscle weakness, morning stiffness, muscle tenderness and myalgias. Negative for joint swelling.  Skin:  Negative for color change, rash, hair loss and sensitivity to sunlight.  Allergic/Immunologic: Positive for susceptible to infections.  Neurological:  Negative for dizziness and headaches.  Hematological:  Negative for swollen glands.  Psychiatric/Behavioral:  Positive for sleep  disturbance. Negative for depressed mood. The patient is not nervous/anxious.     PMFS History:  Patient Active Problem List   Diagnosis Date Noted   Positive ANA (antinuclear antibody) 12/17/2024   Diabetes mellitus (HCC) 05/26/2024   Osteopenia 03/10/2024   GAD (generalized anxiety disorder) 01/24/2024   Leg cramp 01/24/2024   Bilateral pes planus 10/16/2023   Eosinophilic esophagitis 10/10/2023   Osteoarthritis of left knee 09/18/2023   Allergic conjunctivitis of both eyes 03/28/2023   Peripheral edema 07/11/2021   Asthma 01/07/2021   GERD (gastroesophageal reflux disease) 10/29/2019   Vitamin D  deficiency 10/29/2019   Family history of breast cancer    Family history of uterine cancer    Family history of lung cancer    Family history of melanoma    Overweight (BMI 25.0-29.9) 03/29/2018   Perennial allergic rhinitis    Hyperlipidemia     Past Medical History:  Diagnosis Date   Adenomatous colon polyp    Allergy     Arthritis    Asthma    Chondrocalcinosis    COVID-19    Diabetes mellitus type II, controlled (HCC)    Diverticulosis    Elevated alkaline phosphatase level    Elevated antinuclear antibody (ANA) level    Eosinophilic esophagitis    Esophageal dysmotility    Esophageal stenosis    Fatty liver    GERD (gastroesophageal reflux disease)    Hyperlipidemia    Internal hemorrhoids    Migraine    Osteoarthritis    Pinched nerve in neck    Schatzki's ring    Sinus congestion    Urinary incontinence     Family History  Problem Relation Age of Onset   Allergic rhinitis Mother    Asthma Mother    Rheum arthritis Mother    Fibromyalgia Mother    Cancer Mother        breast, uterine, adenocarcinoma-right lung   Uterine cancer Mother        Sarcoma   Lung cancer Mother        Adenocarcinoma   Breast cancer Mother 38   Hypertension Father    Melanoma Father 52   Asthma Sister    Asthma Maternal  Aunt    Diabetes Maternal Aunt    Cancer Maternal Aunt 41       GYN cancer - possibly vulvar?   Lung cancer Maternal Aunt    Diabetes Maternal Uncle    Appendicitis Maternal Uncle        d. 12   Diabetes Paternal Aunt    Breast cancer Paternal Aunt 4   Breast cancer Paternal Aunt 31   Uterine cancer Paternal Aunt 50   Breast cancer Paternal Aunt 51   Diabetes Paternal Uncle    Other Maternal Grandmother 30       childbirth   Heart disease Maternal Grandfather    Cancer Paternal Grandmother        unknown   Other Daughter        Phyllodes tumor   Other Nephew        Gilbert's Disease   Colon cancer Neg Hx    Esophageal cancer Neg Hx  Ulcerative colitis Neg Hx    Stomach cancer Neg Hx    Pancreatic cancer Neg Hx    Past Surgical History:  Procedure Laterality Date   ABDOMINAL HYSTERECTOMY     bladder dialation  1968   as a child   CHOLECYSTECTOMY     COLONOSCOPY     TONSILLECTOMY  1971   TOTAL VAGINAL HYSTERECTOMY  07/2010   menorrhagia, leiomyoma, prolapse   TUBAL LIGATION  1998   Social History[1] Social History   Social History Narrative   Not on file     Immunization History  Administered Date(s) Administered   Influenza Split 08/24/2011, 08/13/2013, 09/28/2020   Influenza, Seasonal, Injecte, Preservative Fre 08/28/2024   Influenza,inj,Quad PF,6+ Mos 10/19/2014, 09/24/2015, 10/03/2016, 09/28/2017, 10/09/2018, 10/29/2019, 12/15/2021   Influenza-Unspecified 08/13/2020   Moderna Sars-Covid-2 Vaccination 05/14/2020, 06/11/2020, 12/24/2020   Tdap 03/24/2015     Objective: Vital Signs: BP (!) 99/58 (BP Location: Left Arm, Patient Position: Sitting, Cuff Size: Normal)   Pulse 68   Temp (!) 97.3 F (36.3 C)   Resp 14   Ht 5' 6 (1.676 m)   Wt 158 lb (71.7 kg)   LMP 07/24/2010   BMI 25.50 kg/m    Physical Exam   Musculoskeletal Exam: ***  B/l knees crepitus Nontender solid nodule at achilles insertion  b/l ***  Investigation: No additional findings.  Imaging: DG Chest 2 View Result Date: 11/27/2024 EXAM: 2 VIEW(S) XRAY OF THE CHEST 11/21/2024 09:09:49 AM COMPARISON: None available. CLINICAL HISTORY: persistent cough FINDINGS: LUNGS AND PLEURA: No focal pulmonary opacity. No pleural effusion. No pneumothorax. HEART AND MEDIASTINUM: No acute abnormality of the cardiac and mediastinal silhouettes. BONES AND SOFT TISSUES: No acute osseous abnormality. IMPRESSION: 1. No acute cardiopulmonary process. Electronically signed by: Kate Plummer MD 11/27/2024 10:59 PM EST RP Workstation: [id number]    Recent Labs: Lab Results  Component Value Date   WBC 7.6 08/28/2024   HGB 13.3 08/28/2024   PLT 280 08/28/2024   NA 141 12/01/2024   K 4.3 12/01/2024   CL 103 12/01/2024   CO2 24 12/01/2024   GLUCOSE 92 12/01/2024   BUN 11 12/01/2024   CREATININE 0.71 12/01/2024   BILITOT 0.5 12/01/2024   ALKPHOS 138 (H) 12/01/2024   AST 12 12/01/2024   ALT 19 12/01/2024   PROT 6.7 12/01/2024   ALBUMIN 3.9 12/01/2024   CALCIUM  9.8 12/01/2024   GFRAA 101 01/07/2021    Speciality Comments: No specialty comments available.  Procedures:  No procedures performed Allergies: Other, Contrast media [iodinated contrast media], Dust mite extract, Haldol [haloperidol decanoate], Molds & smuts, Pravastatin , and Sulfa antibiotics   Assessment / Plan:     Visit Diagnoses:  Assessment & Plan Positive ANA (antinuclear antibody) Positive ANA with low suspicion for lupus or rheumatoid arthritis. No significant symptoms of fibromyalgia or other autoimmune conditions. Family history noted but not indicative of current symptoms. - Ordered specific antibody tests to rule out lupus and rheumatoid arthritis. - Provided information on fibromyalgia and myofascial pain from the [facility] [place]  Department of Rheumatology. - Will follow up based on test results to determine need for medication or further  monitoring. Orders:   Sedimentation rate   Anti-DNA antibody, double-stranded   Anti-Smith antibody   Sjogrens syndrome-A extractable nuclear antibody   RNP Antibody   Rheumatoid factor   Cyclic citrul peptide antibody, IgG  Primary osteoarthritis of left knee Bilateral pes planus Osteoarthritis with pain and stiffness, especially in knees. Previous knee injections and x-rays noted.  Pain worsens with stairs and certain movements. No significant weight-related factors. Discussed non-impact exercises and topical treatment. - Recommended non-impact exercises to strengthen upper leg muscles, such as leg raises, leg press, and squats, avoiding exercises that cause knee pain. - Suggested topical Voltaren for symptomatic relief of knee pain. - Advised to avoid systemic NSAIDs due to potential liver impact.        Follow-Up Instructions: Return if symptoms worsen or fail to improve.   Lonni LELON Ester, MD  Note - This record has been created using [employer].  Chart creation errors have been sought, but may not always  have been located. Such creation errors do not reflect on  the standard of medical care.       [1] Social History Tobacco Use   Smoking status: Never    Passive exposure: Never   Smokeless tobacco: Never  Vaping Use   Vaping status: Never Used  Substance Use Topics   Alcohol use: No    Alcohol/week: 0.0 standard drinks of alcohol   Drug use: No  "

## 2024-12-17 NOTE — Assessment & Plan Note (Addendum)
 Osteoarthritis with pain and stiffness, especially in knees. Previous knee injections and x-rays noted. Pain worsens with stairs and certain movements. No significant weight-related factors. Discussed non-impact exercises and topical treatment. - Recommended non-impact exercises to strengthen upper leg muscles, such as leg raises, leg press, and squats, avoiding exercises that cause knee pain. - Suggested topical Voltaren for symptomatic relief of knee pain. - Advised to avoid systemic NSAIDs due to potential liver impact.

## 2024-12-17 NOTE — Assessment & Plan Note (Addendum)
 Positive ANA with low suspicion for lupus or rheumatoid arthritis. No significant symptoms of fibromyalgia or other autoimmune conditions. Family history noted but not indicative of current symptoms. - Ordered specific antibody tests to rule out lupus and rheumatoid arthritis. - Provided information on fibromyalgia and myofascial pain from the [facility] [place]  Department of Rheumatology. - Will follow up based on test results to determine need for medication or further monitoring. Orders:   Sedimentation rate   Anti-DNA antibody, double-stranded   Anti-Smith antibody   Sjogrens syndrome-A extractable nuclear antibody   RNP Antibody   Rheumatoid factor   Cyclic citrul peptide antibody, IgG

## 2024-12-17 NOTE — Patient Instructions (Signed)
 I recommend checking out the [facility] [place]  patient-centered guide for fibromyalgia and chronic pain management: [email or web address]         Supplements for Osteoarthritis  Natural anti-inflammatories can help reduce inflammation and joint stiffness without some of the harmful side effects of non-steroidal anti-inflammatories (Advil, Motrin, Aleve, etc)  Recommend starting with one supplement and give a 3-4 week trial period before adding another  You may be able to find some of these products at your local pharmacy, but may also purchase at [employer], other specialty stores, or online   Turmeric Recommended dose 400mg  once a day May increase to twice a day if tolerated (may cause stomach upset) Do not take if you are on a blood thinner, and stop prior to surgery   Ginger (root or capsules) Recommended dose is 2 grams twice daily, or 2 cups of tea daily Do not take if you are on a blood thinner, and stop prior to surgery   Fish Oil or Omega 3 Recommended dose for capsule is 2 grams twice daily (make sure it contains at least 30% of EPA/DHA) For food, two 3-ounce servings of fish a week, or flaxseed, chia seeds, walnuts, and almonds   Tart Cherry (dried, extract, or tablets) Recommended dose is 500mg  once a day   *Although these are natural products, they can still interact with medications. Always consult with your doctor or pharmacist when starting new supplements and/or medications*  *Patents should be under the care of a physician or other medical provide while taking these supplements*

## 2024-12-18 LAB — ANTI-SMITH ANTIBODY: ENA SM Ab Ser-aCnc: <1 AI

## 2024-12-18 LAB — SJOGRENS SYNDROME-A EXTRACTABLE NUCLEAR ANTIBODY: SSA (Ro) (ENA) Antibody, IgG: <1 AI

## 2024-12-18 LAB — RHEUMATOID FACTOR: Rheumatoid fact SerPl-aCnc: <10 [IU]/mL

## 2024-12-18 LAB — SEDIMENTATION RATE: Sed Rate: 14 mm/h (ref 0–30)

## 2024-12-18 LAB — ANTI-DNA ANTIBODY, DOUBLE-STRANDED: ds DNA Ab: 1 [IU]/mL

## 2024-12-18 LAB — RNP ANTIBODY: Ribonucleic Protein(ENA) Antibody, IgG: <1 AI

## 2024-12-18 LAB — CYCLIC CITRUL PEPTIDE ANTIBODY, IGG: Cyclic Citrullin Peptide Ab: <16 U

## 2024-12-30 ENCOUNTER — Ambulatory Visit: Admitting: Radiology

## 2025-01-12 ENCOUNTER — Ambulatory Visit: Admitting: Internal Medicine

## 2025-03-09 ENCOUNTER — Ambulatory Visit: Admitting: Family
